# Patient Record
Sex: Male | Born: 1955
Health system: Southern US, Community
[De-identification: ages and names within clinical notes are randomized; demographics above are authoritative.]

## PROBLEM LIST (undated history)

## (undated) DIAGNOSIS — M549 Dorsalgia, unspecified: Secondary | ICD-10-CM

## (undated) DIAGNOSIS — G8929 Other chronic pain: Secondary | ICD-10-CM

## (undated) DIAGNOSIS — F1110 Opioid abuse, uncomplicated: Secondary | ICD-10-CM

## (undated) DIAGNOSIS — M1990 Unspecified osteoarthritis, unspecified site: Secondary | ICD-10-CM

## (undated) DIAGNOSIS — J189 Pneumonia, unspecified organism: Secondary | ICD-10-CM

## (undated) DIAGNOSIS — F329 Major depressive disorder, single episode, unspecified: Secondary | ICD-10-CM

## (undated) DIAGNOSIS — J449 Chronic obstructive pulmonary disease, unspecified: Secondary | ICD-10-CM

## (undated) DIAGNOSIS — E785 Hyperlipidemia, unspecified: Secondary | ICD-10-CM

## (undated) DIAGNOSIS — F419 Anxiety disorder, unspecified: Secondary | ICD-10-CM

## (undated) DIAGNOSIS — F32A Depression, unspecified: Secondary | ICD-10-CM

## (undated) DIAGNOSIS — M542 Cervicalgia: Secondary | ICD-10-CM

## (undated) DIAGNOSIS — I10 Essential (primary) hypertension: Secondary | ICD-10-CM

## (undated) HISTORY — PX: HERNIA REPAIR: SHX51

## (undated) HISTORY — DX: Hyperlipidemia, unspecified: E78.5

## (undated) HISTORY — DX: Unspecified osteoarthritis, unspecified site: M19.90

## (undated) HISTORY — PX: NECK SURGERY: SHX720

## (undated) HISTORY — PX: OTHER SURGICAL HISTORY: SHX169

## (undated) HISTORY — PX: CHOLECYSTECTOMY: SHX55

## (undated) HISTORY — PX: SPINAL FUSION: SHX223

## (undated) HISTORY — DX: Pneumonia, unspecified organism: J18.9

---

## 2004-05-15 ENCOUNTER — Ambulatory Visit: Payer: Self-pay | Admitting: Physician Assistant

## 2004-06-12 ENCOUNTER — Ambulatory Visit: Payer: Self-pay | Admitting: Physician Assistant

## 2004-07-09 ENCOUNTER — Ambulatory Visit: Payer: Self-pay | Admitting: Physician Assistant

## 2004-11-19 ENCOUNTER — Ambulatory Visit: Payer: Self-pay | Admitting: Physician Assistant

## 2006-03-11 ENCOUNTER — Ambulatory Visit: Payer: Self-pay | Admitting: Pain Medicine

## 2006-03-24 ENCOUNTER — Ambulatory Visit: Payer: Self-pay | Admitting: Pain Medicine

## 2006-03-31 ENCOUNTER — Ambulatory Visit: Payer: Self-pay | Admitting: Pain Medicine

## 2006-04-07 ENCOUNTER — Ambulatory Visit: Payer: Self-pay | Admitting: Pain Medicine

## 2006-04-21 ENCOUNTER — Ambulatory Visit: Payer: Self-pay | Admitting: Pain Medicine

## 2006-05-19 ENCOUNTER — Ambulatory Visit: Payer: Self-pay | Admitting: Physician Assistant

## 2006-06-22 ENCOUNTER — Ambulatory Visit: Payer: Self-pay | Admitting: Physician Assistant

## 2006-06-29 ENCOUNTER — Ambulatory Visit: Payer: Self-pay | Admitting: Family Medicine

## 2006-07-13 ENCOUNTER — Ambulatory Visit: Payer: Self-pay | Admitting: Physician Assistant

## 2006-08-19 ENCOUNTER — Ambulatory Visit: Payer: Self-pay | Admitting: Physician Assistant

## 2006-09-23 ENCOUNTER — Ambulatory Visit: Payer: Self-pay | Admitting: Physician Assistant

## 2007-01-01 ENCOUNTER — Ambulatory Visit: Payer: Self-pay | Admitting: Physician Assistant

## 2007-01-15 ENCOUNTER — Ambulatory Visit: Payer: Self-pay | Admitting: Physician Assistant

## 2007-03-01 ENCOUNTER — Ambulatory Visit: Payer: Self-pay | Admitting: Physician Assistant

## 2007-04-26 ENCOUNTER — Emergency Department: Payer: Self-pay | Admitting: Emergency Medicine

## 2007-07-02 ENCOUNTER — Other Ambulatory Visit: Payer: Self-pay

## 2007-07-03 ENCOUNTER — Inpatient Hospital Stay: Payer: Self-pay | Admitting: General Surgery

## 2007-07-06 ENCOUNTER — Other Ambulatory Visit: Payer: Self-pay

## 2009-06-18 ENCOUNTER — Emergency Department: Payer: Self-pay | Admitting: Emergency Medicine

## 2009-06-21 ENCOUNTER — Emergency Department: Payer: Self-pay | Admitting: Emergency Medicine

## 2009-06-25 ENCOUNTER — Emergency Department: Payer: Self-pay | Admitting: Emergency Medicine

## 2009-07-02 ENCOUNTER — Emergency Department: Payer: Self-pay | Admitting: Emergency Medicine

## 2009-07-16 ENCOUNTER — Emergency Department: Payer: Self-pay | Admitting: Emergency Medicine

## 2010-10-08 DIAGNOSIS — J449 Chronic obstructive pulmonary disease, unspecified: Secondary | ICD-10-CM | POA: Insufficient documentation

## 2011-01-09 DIAGNOSIS — F17200 Nicotine dependence, unspecified, uncomplicated: Secondary | ICD-10-CM | POA: Insufficient documentation

## 2011-03-25 DIAGNOSIS — K219 Gastro-esophageal reflux disease without esophagitis: Secondary | ICD-10-CM | POA: Insufficient documentation

## 2011-05-14 DIAGNOSIS — G894 Chronic pain syndrome: Secondary | ICD-10-CM | POA: Insufficient documentation

## 2011-05-14 DIAGNOSIS — M4802 Spinal stenosis, cervical region: Secondary | ICD-10-CM | POA: Insufficient documentation

## 2011-05-14 DIAGNOSIS — K429 Umbilical hernia without obstruction or gangrene: Secondary | ICD-10-CM | POA: Insufficient documentation

## 2011-08-06 DIAGNOSIS — F329 Major depressive disorder, single episode, unspecified: Secondary | ICD-10-CM | POA: Insufficient documentation

## 2011-08-06 DIAGNOSIS — G4700 Insomnia, unspecified: Secondary | ICD-10-CM | POA: Insufficient documentation

## 2011-08-11 ENCOUNTER — Emergency Department: Payer: Self-pay | Admitting: Emergency Medicine

## 2011-08-11 LAB — TROPONIN I: Troponin-I: <0.02 ng/mL

## 2011-08-11 LAB — COMPREHENSIVE METABOLIC PANEL
Albumin: 3.6 g/dL (ref 3.4–5.0)
Alkaline Phosphatase: 62 U/L (ref 50–136)
BUN: 10 mg/dL (ref 7–18)
Bilirubin,Total: 0.5 mg/dL (ref 0.2–1.0)
Chloride: 104 mmol/L (ref 98–107)
Creatinine: 0.86 mg/dL (ref 0.60–1.30)
EGFR (African American): >60
SGOT(AST): 25 U/L (ref 15–37)
SGPT (ALT): 28 U/L
Sodium: 139 mmol/L (ref 136–145)
Total Protein: 6.9 g/dL (ref 6.4–8.2)

## 2011-08-11 LAB — URINALYSIS, COMPLETE
Bacteria: NONE SEEN
Bilirubin,UR: NEGATIVE
Blood: NEGATIVE
Ketone: NEGATIVE
Ph: 6 (ref 4.5–8.0)
Specific Gravity: 1.004 (ref 1.003–1.030)
Squamous Epithelial: NONE SEEN

## 2011-08-11 LAB — CBC
MCH: 31.2 pg (ref 26.0–34.0)
MCV: 95 fL (ref 80–100)
Platelet: 309 10*3/uL (ref 150–440)
RBC: 5.17 10*6/uL (ref 4.40–5.90)
RDW: 16.7 % — ABNORMAL HIGH (ref 11.5–14.5)
WBC: 15.1 10*3/uL — ABNORMAL HIGH (ref 3.8–10.6)

## 2011-08-11 LAB — DRUG SCREEN, URINE
Benzodiazepine, Ur Scrn: NEGATIVE (ref ?–200)
Cannabinoid 50 Ng, Ur ~~LOC~~: NEGATIVE (ref ?–50)
Cocaine Metabolite,Ur ~~LOC~~: NEGATIVE (ref ?–300)
MDMA (Ecstasy)Ur Screen: NEGATIVE (ref ?–500)
Methadone, Ur Screen: NEGATIVE (ref ?–300)
Opiate, Ur Screen: NEGATIVE (ref ?–300)
Phencyclidine (PCP) Ur S: NEGATIVE (ref ?–25)
Tricyclic, Ur Screen: POSITIVE (ref ?–1000)

## 2011-08-11 LAB — ETHANOL: Ethanol: <3 mg/dL

## 2012-06-27 ENCOUNTER — Emergency Department: Payer: Self-pay | Admitting: Emergency Medicine

## 2012-06-27 LAB — URINALYSIS, COMPLETE
Bilirubin,UR: NEGATIVE
Blood: NEGATIVE
Leukocyte Esterase: NEGATIVE
Nitrite: NEGATIVE
Ph: 7 (ref 4.5–8.0)
Protein: NEGATIVE
Specific Gravity: 1.001 (ref 1.003–1.030)
Squamous Epithelial: NONE SEEN
WBC UR: NONE SEEN /HPF (ref 0–5)

## 2012-06-27 LAB — CBC
HGB: 14.3 g/dL (ref 13.0–18.0)
Platelet: 308 10*3/uL (ref 150–440)
RBC: 4.77 10*6/uL (ref 4.40–5.90)
RDW: 16.9 % — ABNORMAL HIGH (ref 11.5–14.5)
WBC: 8.4 10*3/uL (ref 3.8–10.6)

## 2012-06-27 LAB — COMPREHENSIVE METABOLIC PANEL
Alkaline Phosphatase: 89 U/L (ref 50–136)
Anion Gap: 7 (ref 7–16)
BUN: 6 mg/dL — ABNORMAL LOW (ref 7–18)
Calcium, Total: 9 mg/dL (ref 8.5–10.1)
Chloride: 105 mmol/L (ref 98–107)
Co2: 28 mmol/L (ref 21–32)
Creatinine: 0.92 mg/dL (ref 0.60–1.30)
EGFR (African American): >60
EGFR (Non-African Amer.): >60
Glucose: 120 mg/dL — ABNORMAL HIGH (ref 65–99)
Osmolality: 278 (ref 275–301)
Potassium: 4.1 mmol/L (ref 3.5–5.1)
SGOT(AST): 18 U/L (ref 15–37)
SGPT (ALT): 14 U/L (ref 12–78)
Total Protein: 7.1 g/dL (ref 6.4–8.2)

## 2012-08-14 ENCOUNTER — Emergency Department: Payer: Self-pay | Admitting: Emergency Medicine

## 2012-08-14 LAB — ACETAMINOPHEN LEVEL: Acetaminophen: <2 ug/mL

## 2012-08-14 LAB — URINALYSIS, COMPLETE
Bacteria: NONE SEEN
Bilirubin,UR: NEGATIVE
Glucose,UR: NEGATIVE mg/dL (ref 0–75)
Ketone: NEGATIVE
Leukocyte Esterase: NEGATIVE
Nitrite: NEGATIVE
Ph: 5 (ref 4.5–8.0)
Protein: NEGATIVE
Specific Gravity: 1.003 (ref 1.003–1.030)
WBC UR: <1 /HPF (ref 0–5)

## 2012-08-14 LAB — COMPREHENSIVE METABOLIC PANEL
Albumin: 3.8 g/dL (ref 3.4–5.0)
Alkaline Phosphatase: 121 U/L (ref 50–136)
Anion Gap: 10 (ref 7–16)
Bilirubin,Total: 0.5 mg/dL (ref 0.2–1.0)
Calcium, Total: 9.4 mg/dL (ref 8.5–10.1)
Co2: 25 mmol/L (ref 21–32)
EGFR (Non-African Amer.): >60
Glucose: 115 mg/dL — ABNORMAL HIGH (ref 65–99)
Osmolality: 270 (ref 275–301)
SGOT(AST): 24 U/L (ref 15–37)
Sodium: 135 mmol/L — ABNORMAL LOW (ref 136–145)
Total Protein: 7.7 g/dL (ref 6.4–8.2)

## 2012-08-14 LAB — CBC
HGB: 15.9 g/dL (ref 13.0–18.0)
MCH: 28.9 pg (ref 26.0–34.0)
Platelet: 394 10*3/uL (ref 150–440)
RBC: 5.48 10*6/uL (ref 4.40–5.90)
RDW: 17.3 % — ABNORMAL HIGH (ref 11.5–14.5)
WBC: 14.4 10*3/uL — ABNORMAL HIGH (ref 3.8–10.6)

## 2012-08-14 LAB — DRUG SCREEN, URINE
Amphetamines, Ur Screen: NEGATIVE (ref ?–1000)
Cannabinoid 50 Ng, Ur ~~LOC~~: NEGATIVE (ref ?–50)
Cocaine Metabolite,Ur ~~LOC~~: NEGATIVE (ref ?–300)
MDMA (Ecstasy)Ur Screen: NEGATIVE (ref ?–500)
Opiate, Ur Screen: NEGATIVE (ref ?–300)
Phencyclidine (PCP) Ur S: NEGATIVE (ref ?–25)
Tricyclic, Ur Screen: NEGATIVE (ref ?–1000)

## 2012-08-14 LAB — SALICYLATE LEVEL: Salicylates, Serum: 4.3 mg/dL — ABNORMAL HIGH

## 2013-02-01 DIAGNOSIS — M545 Low back pain, unspecified: Secondary | ICD-10-CM | POA: Insufficient documentation

## 2013-02-01 DIAGNOSIS — M961 Postlaminectomy syndrome, not elsewhere classified: Secondary | ICD-10-CM | POA: Insufficient documentation

## 2013-02-01 DIAGNOSIS — G8929 Other chronic pain: Secondary | ICD-10-CM | POA: Insufficient documentation

## 2013-05-26 LAB — COMPREHENSIVE METABOLIC PANEL
Albumin: 3.4 g/dL (ref 3.4–5.0)
Anion Gap: 6 — ABNORMAL LOW (ref 7–16)
BUN: 10 mg/dL (ref 7–18)
Bilirubin,Total: 0.4 mg/dL (ref 0.2–1.0)
Calcium, Total: 9 mg/dL (ref 8.5–10.1)
Co2: 24 mmol/L (ref 21–32)
Creatinine: 1.09 mg/dL (ref 0.60–1.30)
EGFR (Non-African Amer.): >60
Glucose: 130 mg/dL — ABNORMAL HIGH (ref 65–99)
SGPT (ALT): 14 U/L (ref 12–78)
Total Protein: 6.3 g/dL — ABNORMAL LOW (ref 6.4–8.2)

## 2013-05-26 LAB — ETHANOL: Ethanol: <3 mg/dL

## 2013-05-26 LAB — URINALYSIS, COMPLETE
Bacteria: NONE SEEN
Blood: NEGATIVE
Glucose,UR: NEGATIVE mg/dL (ref 0–75)
Ph: 7 (ref 4.5–8.0)
Protein: NEGATIVE
Specific Gravity: 1.01 (ref 1.003–1.030)
WBC UR: <1 /HPF (ref 0–5)

## 2013-05-26 LAB — DRUG SCREEN, URINE
Amphetamines, Ur Screen: NEGATIVE (ref ?–1000)
Benzodiazepine, Ur Scrn: NEGATIVE (ref ?–200)
Cannabinoid 50 Ng, Ur ~~LOC~~: NEGATIVE (ref ?–50)
Cocaine Metabolite,Ur ~~LOC~~: NEGATIVE (ref ?–300)
Methadone, Ur Screen: NEGATIVE (ref ?–300)
Opiate, Ur Screen: NEGATIVE (ref ?–300)
Phencyclidine (PCP) Ur S: NEGATIVE (ref ?–25)
Tricyclic, Ur Screen: NEGATIVE (ref ?–1000)

## 2013-05-26 LAB — CBC
HCT: 45.1 % (ref 40.0–52.0)
HGB: 15.5 g/dL (ref 13.0–18.0)
MCH: 32.6 pg (ref 26.0–34.0)
MCV: 95 fL (ref 80–100)
RDW: 15.7 % — ABNORMAL HIGH (ref 11.5–14.5)

## 2013-05-26 LAB — SALICYLATE LEVEL: Salicylates, Serum: 5.3 mg/dL — ABNORMAL HIGH

## 2013-05-26 LAB — ACETAMINOPHEN LEVEL: Acetaminophen: <2 ug/mL

## 2013-05-26 LAB — TSH: Thyroid Stimulating Horm: 0.356 u[IU]/mL — ABNORMAL LOW

## 2013-05-27 ENCOUNTER — Inpatient Hospital Stay: Payer: Self-pay | Admitting: Psychiatry

## 2014-02-28 DIAGNOSIS — I10 Essential (primary) hypertension: Secondary | ICD-10-CM | POA: Insufficient documentation

## 2014-05-03 DIAGNOSIS — K589 Irritable bowel syndrome without diarrhea: Secondary | ICD-10-CM | POA: Insufficient documentation

## 2014-06-14 DIAGNOSIS — H43821 Vitreomacular adhesion, right eye: Secondary | ICD-10-CM | POA: Insufficient documentation

## 2014-06-14 DIAGNOSIS — IMO0002 Reserved for concepts with insufficient information to code with codable children: Secondary | ICD-10-CM | POA: Insufficient documentation

## 2014-06-14 DIAGNOSIS — H04129 Dry eye syndrome of unspecified lacrimal gland: Secondary | ICD-10-CM | POA: Insufficient documentation

## 2014-07-11 ENCOUNTER — Ambulatory Visit: Payer: Self-pay | Admitting: Physician Assistant

## 2014-07-11 LAB — RAPID STREP-A WITH REFLX: Micro Text Report: NEGATIVE

## 2014-07-14 LAB — BETA STREP CULTURE(ARMC)

## 2014-11-17 NOTE — Consult Note (Signed)
Brief Consult Note: Diagnosis: Major depressive disorder.   Patient was seen by consultant.   Recommend further assessment or treatment.   Orders entered.   Comments: Will admitt to psychiatry.  Electronic Signatures: Orson Slick (MD)  (Signed 31-Oct-14 15:04)  Authored: Brief Consult Note   Last Updated: 31-Oct-14 15:04 by Orson Slick (MD)

## 2014-11-17 NOTE — H&P (Signed)
PATIENT NAME:  Stanley Rios, SANKER MR#:  [id number] DATE OF BIRTH:  Sep 05, 1955  DATE OF ADMISSION:  05/26/2013  REFERRING PHYSICIAN: Emergency Room M.D.   ATTENDING PHYSICIAN: Dashayla Theissen B. Bary Leriche, M.D.   IDENTIFYING DATA: Stanley Rios is a 59 year old male with history of depression and chronic pain.   CHIEF COMPLAINT: "It is my wife."   HISTORY OF PRESENT ILLNESS:  Stanley Rios was brought to the Emergency Room by EMS after he attempted to kill himself by Klonopin overdose and slashing his throat. Stitches were put in place and psychiatry was called for a consultation. The patient reports severe depression. He feels mistreated by his wife, who yells at him all the time. Apparently, they are in financial trouble that possibly was caused by his wife and reportedly there is a thousand dollar debt with the bank the patient is unable to pay. He feels that his family abandoned him and nobody cares, and the wife is really mean and nasty. He has been thinking about moving away, maybe to a nursing home, but has not been able to make any steps. In addition, the patient has chronic pain in his neck. It is very unclear as he is a poor historian, cries a lot and is unreliable. Apparently, the patient used to be a patient at the pain clinic. He was prescribed fentanyl patch and oxycodone, and it appears that he had been misusing his prescriptions. The family he did not like  when he was oversedated from pain medication, and they reported him to his pain doctor. It is quite possible that he was discharged from pain clinic. I did not have the opportunity to call the pain clinic or his pharmacy. The list of his medications in the Emergency Room seems to be incomplete. The patient reports interrupted sleep, decreased appetite, anhedonia, feeling of guilt, hopelessness, worthlessness, crying spells, heightened anxiety, social isolation, poor memory and concentration that culminated in a suicide attempt. He denies psychotic  symptoms. Denies symptoms suggestive of bipolar mania. He adamantly denies any prescription pill misuse. He denies alcohol or illicit substance use.   PAST PSYCHIATRIC HISTORY: He reports that he was hospitalized once here, but I see no proof of it, and after a suicide attempt by overdose. He believes that, again, he overdosed because the wife treated him poorly.   FAMILY PSYCHIATRIC HISTORY: None reported.   PAST MEDICAL HISTORY: Chronic pain, benign prostate hypertrophy, dyslipidemia, GERD, hypertension.   ALLERGIES: No known drug allergies.   MEDICATIONS ON ADMISSION: Amoxicillin 875 mg twice daily, lidocaine transdermal daily, Flomax 0.4 mg daily, oxycodone 10 mg every 6 hours as needed for pain, finasteride 1 mg daily, Zostavax subcutaneous injection, Proventil as needed, prednisone 10 mg daily, simvastatin 20 mg daily, omeprazole 40 mg daily, Neurontin 600 mg 4 times daily, Flonase 50 mg daily, furosemide 20 mg daily, enalapril 5 mg daily, Flexeril 5 mg 3 times daily, Cymbalta 30 mg twice daily, Advair Diskus twice daily, clonidine 0.1 mg twice daily.   SOCIAL HISTORY: As above, lives with wife. There is financial trouble. Feels unsupported. He is on disability, has Medicaid.     REVIEW OF SYSTEMS:   CONSTITUTIONAL: No fevers or chills. No weight changes.  EYES: No double or blurred vision.  ENT: No hearing loss.  RESPIRATORY: No shortness of breath or cough.  CARDIOVASCULAR: No chest pain or orthopnea.  GASTROINTESTINAL: No abdominal pain, nausea, vomiting or diarrhea.  ENDOCRINE: No heat or cold intolerance.  GENITOURINARY:  No incontinence or frequency.  LYMPHATIC:  No anemia or easy bruising.  INTEGUMENTARY: No acne or rash.  MUSCULOSKELETAL: No muscle or joint pain.  NEUROLOGIC: No tingling or weakness.  PSYCHIATRIC: See history of present illness for details.   PHYSICAL EXAMINATION: VITAL SIGNS: Blood pressure 110/56, pulse 71, respirations 18, temperature 98.4.  GENERAL:  This is a unkept elderly gentleman in no acute distress.  HEENT: The pupils equal, round and reactive to light. Sclerae anicteric.  NECK: Supple. No thyromegaly.  LUNGS: Clear to auscultation. No dullness to percussion.  HEART: Regular rhythm and rate. No murmurs, rubs or gallops.  ABDOMEN: Soft, nontender, nondistended. Positive bowel sounds.  MUSCULOSKELETAL: Normal muscle strength in all extremities.  SKIN: No rashes or bruises.  LYMPHATIC: No cervical adenopathy.  NEUROLOGIC: Cranial nerves II through XII are intact.   LABORATORY DATA: Chemistries within normal limits except for blood glucose of 130. Blood alcohol level 0. LFTs within normal limits. TSH 0.356. Urine tox screen negative for substances. CBC within normal limits. Urinalysis is not suggestive of urinary tract infection. Serum acetaminophen less than 2, serum salicylates 5.3.   MENTAL STATUS EXAMINATION ON ADMISSION: The patient is alert and oriented to person, place, time and situation. He is pleasant, polite and cooperative. He cries bitterly, complaining of his wife. He maintains no eye contact. He is very poorly groomed with dirty fingernails, extremely strong body odor. His speech is mumbled, difficult to understand. His mood is depressed with tearful affect. Thought process is logical. Thought content: He is suicidal, and he came to the hospital after a suicide attempt. He is able to contract for safety  here. There are no thoughts of hurting others. There are no delusions or paranoia. There are no auditory or visual hallucinations. His cognition is grossly intact. His insight and judgment are poor.   SUICIDE RISK ASSESSMENT ON ADMISSION: This is a patient with history of depression, chronic pain who attempted suicide in the context of marital discord and financial trouble. He is at increased risk of suicide.   DIAGNOSES: AXIS I: Major depressive disorder, recurrent, severe, without psychotic features.  AXIS II: Deferred.   AXIS III: Chronic pain, hypertension, chronic obstructive pulmonary disease, gastroesophageal reflux disease, dyslipidemia.  AXIS IV: Mental and physical illness. Marital conflict. Financial. Primary support.  AXIS V: Global Assessment of Functioning 25.   PLAN: The patient was [facility] for safety, stabilization and medication management. He was initially placed on suicide precautions and was closely monitored for any unsafe behaviors. He underwent full psychiatric and risk assessment. He received pharmacotherapy, individual and group psychotherapy, substance abuse counseling and support from therapeutic milieu.  1.  Suicidal ideation: The patient is able to contract for safety.  2.  Mood:  We will increase Cymbalta to 60 mg twice daily.  3.  Medical: We will continue all medications as in the community.  4.  Pain:  It is very unclear whether the patient requires any pain medication. He certainly did not complain of pain in the Emergency Room. We will obtain collateral data.   DISPOSITION: To be established. The patient would not mind to be placed, but I fear that his financial situation would not allow it; a Education officer, museum to investigate.     ____________________________ Wardell Honour. Bary Leriche, MD jbp:dmm D: 05/27/2013 19:20:00 ET T: 05/27/2013 20:13:40 ET JOB#: [id number]  cc: Hudson Majkowski B. Bary Leriche, MD, <Dictator> Clovis Fredrickson MD ELECTRONICALLY SIGNED 05/29/2013 22:10

## 2015-05-02 DIAGNOSIS — J439 Emphysema, unspecified: Secondary | ICD-10-CM | POA: Insufficient documentation

## 2015-05-16 DIAGNOSIS — L821 Other seborrheic keratosis: Secondary | ICD-10-CM | POA: Insufficient documentation

## 2015-08-16 DIAGNOSIS — M4802 Spinal stenosis, cervical region: Secondary | ICD-10-CM | POA: Diagnosis not present

## 2015-08-16 DIAGNOSIS — J431 Panlobular emphysema: Secondary | ICD-10-CM | POA: Diagnosis not present

## 2015-08-16 DIAGNOSIS — E559 Vitamin D deficiency, unspecified: Secondary | ICD-10-CM | POA: Insufficient documentation

## 2015-08-16 DIAGNOSIS — G894 Chronic pain syndrome: Secondary | ICD-10-CM | POA: Diagnosis not present

## 2015-08-16 DIAGNOSIS — E291 Testicular hypofunction: Secondary | ICD-10-CM | POA: Diagnosis not present

## 2015-08-16 DIAGNOSIS — I10 Essential (primary) hypertension: Secondary | ICD-10-CM | POA: Diagnosis not present

## 2015-08-16 DIAGNOSIS — E349 Endocrine disorder, unspecified: Secondary | ICD-10-CM | POA: Insufficient documentation

## 2015-08-16 DIAGNOSIS — F329 Major depressive disorder, single episode, unspecified: Secondary | ICD-10-CM | POA: Diagnosis not present

## 2015-10-17 DIAGNOSIS — J439 Emphysema, unspecified: Secondary | ICD-10-CM | POA: Diagnosis not present

## 2015-10-17 DIAGNOSIS — J449 Chronic obstructive pulmonary disease, unspecified: Secondary | ICD-10-CM | POA: Diagnosis not present

## 2015-10-17 DIAGNOSIS — F17200 Nicotine dependence, unspecified, uncomplicated: Secondary | ICD-10-CM | POA: Diagnosis not present

## 2015-10-17 DIAGNOSIS — J431 Panlobular emphysema: Secondary | ICD-10-CM | POA: Diagnosis not present

## 2015-10-25 ENCOUNTER — Encounter: Payer: Self-pay | Admitting: Urgent Care

## 2015-10-25 DIAGNOSIS — G8929 Other chronic pain: Secondary | ICD-10-CM | POA: Diagnosis present

## 2015-10-25 DIAGNOSIS — N5089 Other specified disorders of the male genital organs: Secondary | ICD-10-CM | POA: Diagnosis not present

## 2015-10-25 DIAGNOSIS — K219 Gastro-esophageal reflux disease without esophagitis: Secondary | ICD-10-CM | POA: Diagnosis present

## 2015-10-25 DIAGNOSIS — I10 Essential (primary) hypertension: Secondary | ICD-10-CM | POA: Diagnosis not present

## 2015-10-25 DIAGNOSIS — J449 Chronic obstructive pulmonary disease, unspecified: Secondary | ICD-10-CM | POA: Diagnosis present

## 2015-10-25 DIAGNOSIS — A419 Sepsis, unspecified organism: Principal | ICD-10-CM | POA: Diagnosis present

## 2015-10-25 DIAGNOSIS — N400 Enlarged prostate without lower urinary tract symptoms: Secondary | ICD-10-CM | POA: Diagnosis present

## 2015-10-25 DIAGNOSIS — N492 Inflammatory disorders of scrotum: Secondary | ICD-10-CM | POA: Diagnosis present

## 2015-10-25 DIAGNOSIS — M549 Dorsalgia, unspecified: Secondary | ICD-10-CM | POA: Diagnosis not present

## 2015-10-25 DIAGNOSIS — J438 Other emphysema: Secondary | ICD-10-CM | POA: Diagnosis not present

## 2015-10-25 DIAGNOSIS — Z8052 Family history of malignant neoplasm of bladder: Secondary | ICD-10-CM | POA: Diagnosis not present

## 2015-10-25 DIAGNOSIS — Z79899 Other long term (current) drug therapy: Secondary | ICD-10-CM | POA: Diagnosis not present

## 2015-10-25 DIAGNOSIS — F17200 Nicotine dependence, unspecified, uncomplicated: Secondary | ICD-10-CM | POA: Diagnosis not present

## 2015-10-25 DIAGNOSIS — E876 Hypokalemia: Secondary | ICD-10-CM | POA: Diagnosis not present

## 2015-10-25 DIAGNOSIS — Z7952 Long term (current) use of systemic steroids: Secondary | ICD-10-CM

## 2015-10-25 DIAGNOSIS — D72829 Elevated white blood cell count, unspecified: Secondary | ICD-10-CM | POA: Diagnosis not present

## 2015-10-25 NOTE — ED Notes (Addendum)
Patient presents stating, "I have an infection in my groin". Patient advising that he "messed around with it', which caused "something inside to bust". Patient reporting (+) swelling to his suprapubic area, penis, and scrotum - states, "It is all over down there". Denies urinary symptoms; no penile discharge or bleeding. (+) fever; tmax unknown. Patient has "4 doses" of Amoxicillin left over at home - has been taking those in attempts to self treat. Patient reports insomnia x 3 nights. Also wanting to get his lip checked out - reports that it has been swelling as well. No appreciable swelling noted on exam in triage.

## 2015-10-26 ENCOUNTER — Encounter: Payer: Self-pay | Admitting: Internal Medicine

## 2015-10-26 ENCOUNTER — Inpatient Hospital Stay
Admission: EM | Admit: 2015-10-26 | Discharge: 2015-10-27 | DRG: 872 | Disposition: A | Discharge status: Home / Self Care | Payer: PPO | Attending: Internal Medicine | Admitting: Internal Medicine

## 2015-10-26 ENCOUNTER — Emergency Department: Payer: PPO

## 2015-10-26 DIAGNOSIS — L0291 Cutaneous abscess, unspecified: Secondary | ICD-10-CM

## 2015-10-26 DIAGNOSIS — Z7952 Long term (current) use of systemic steroids: Secondary | ICD-10-CM | POA: Diagnosis not present

## 2015-10-26 DIAGNOSIS — I10 Essential (primary) hypertension: Secondary | ICD-10-CM | POA: Diagnosis not present

## 2015-10-26 DIAGNOSIS — E876 Hypokalemia: Secondary | ICD-10-CM | POA: Diagnosis present

## 2015-10-26 DIAGNOSIS — N5089 Other specified disorders of the male genital organs: Secondary | ICD-10-CM | POA: Diagnosis not present

## 2015-10-26 DIAGNOSIS — L0390 Cellulitis, unspecified: Secondary | ICD-10-CM | POA: Diagnosis present

## 2015-10-26 DIAGNOSIS — N492 Inflammatory disorders of scrotum: Secondary | ICD-10-CM | POA: Diagnosis not present

## 2015-10-26 DIAGNOSIS — F17200 Nicotine dependence, unspecified, uncomplicated: Secondary | ICD-10-CM | POA: Diagnosis present

## 2015-10-26 DIAGNOSIS — K219 Gastro-esophageal reflux disease without esophagitis: Secondary | ICD-10-CM | POA: Diagnosis present

## 2015-10-26 DIAGNOSIS — J449 Chronic obstructive pulmonary disease, unspecified: Secondary | ICD-10-CM | POA: Diagnosis not present

## 2015-10-26 DIAGNOSIS — M549 Dorsalgia, unspecified: Secondary | ICD-10-CM | POA: Diagnosis present

## 2015-10-26 DIAGNOSIS — Z79899 Other long term (current) drug therapy: Secondary | ICD-10-CM | POA: Diagnosis not present

## 2015-10-26 DIAGNOSIS — G8929 Other chronic pain: Secondary | ICD-10-CM | POA: Diagnosis present

## 2015-10-26 DIAGNOSIS — A419 Sepsis, unspecified organism: Secondary | ICD-10-CM | POA: Diagnosis present

## 2015-10-26 DIAGNOSIS — Z8052 Family history of malignant neoplasm of bladder: Secondary | ICD-10-CM | POA: Diagnosis not present

## 2015-10-26 DIAGNOSIS — N400 Enlarged prostate without lower urinary tract symptoms: Secondary | ICD-10-CM | POA: Diagnosis present

## 2015-10-26 HISTORY — DX: Chronic obstructive pulmonary disease, unspecified: J44.9

## 2015-10-26 HISTORY — DX: Other chronic pain: G89.29

## 2015-10-26 HISTORY — DX: Dorsalgia, unspecified: M54.9

## 2015-10-26 HISTORY — DX: Cervicalgia: M54.2

## 2015-10-26 LAB — URINALYSIS COMPLETE WITH MICROSCOPIC (ARMC ONLY)
BACTERIA UA: NONE SEEN
BILIRUBIN URINE: NEGATIVE
Glucose, UA: NEGATIVE mg/dL
HGB URINE DIPSTICK: NEGATIVE
Ketones, ur: NEGATIVE mg/dL
LEUKOCYTES UA: NEGATIVE
NITRITE: NEGATIVE
PH: 6 (ref 5.0–8.0)
Protein, ur: NEGATIVE mg/dL
RBC / HPF: NONE SEEN RBC/hpf (ref 0–5)
SPECIFIC GRAVITY, URINE: 1.005 (ref 1.005–1.030)
Squamous Epithelial / LPF: NONE SEEN
WBC UA: NONE SEEN WBC/hpf (ref 0–5)

## 2015-10-26 LAB — CBC
HEMATOCRIT: 41 % (ref 40.0–52.0)
HEMOGLOBIN: 13.9 g/dL (ref 13.0–18.0)
MCH: 33 pg (ref 26.0–34.0)
MCHC: 34 g/dL (ref 32.0–36.0)
MCV: 97.1 fL (ref 80.0–100.0)
Platelets: 257 10*3/uL (ref 150–440)
RBC: 4.23 MIL/uL — AB (ref 4.40–5.90)
RDW: 16.5 % — AB (ref 11.5–14.5)
WBC: 14.3 10*3/uL — AB (ref 3.8–10.6)

## 2015-10-26 LAB — BASIC METABOLIC PANEL
ANION GAP: 8 (ref 5–15)
BUN: 16 mg/dL (ref 6–20)
CHLORIDE: 102 mmol/L (ref 101–111)
CO2: 25 mmol/L (ref 22–32)
Calcium: 9 mg/dL (ref 8.9–10.3)
Creatinine, Ser: 1.21 mg/dL (ref 0.61–1.24)
GFR calc non Af Amer: >60 mL/min (ref >60)
Glucose, Bld: 145 mg/dL — ABNORMAL HIGH (ref 65–99)
POTASSIUM: 3.4 mmol/L — AB (ref 3.5–5.1)
Sodium: 135 mmol/L (ref 135–145)

## 2015-10-26 MED ORDER — MORPHINE SULFATE ER 15 MG PO TBCR: 15.0000 mg | EXTENDED_RELEASE_TABLET | Freq: Two times a day (BID) | ORAL | Status: DC

## 2015-10-26 MED ORDER — RANITIDINE HCL 150 MG/10ML PO SYRP
150.0000 mg | ORAL_SOLUTION | Freq: Two times a day (BID) | ORAL | Status: DC
  Administered 2015-10-26 – 2015-10-27 (×2): 150 mg via ORAL
  Filled 2015-10-26 (×4): qty 10

## 2015-10-26 MED ORDER — GABAPENTIN 400 MG PO CAPS
400.0000 mg | ORAL_CAPSULE | Freq: Three times a day (TID) | ORAL | Status: DC
Start: 2015-10-26 — End: 2015-10-26
  Administered 2015-10-26: 400 mg via ORAL
  Filled 2015-10-26: qty 1

## 2015-10-26 MED ORDER — OMEGA-3-ACID ETHYL ESTERS 1 G PO CAPS
1.0000 g | ORAL_CAPSULE | Freq: Every day | ORAL | Status: DC
  Administered 2015-10-26 – 2015-10-27 (×2): 1 g via ORAL
  Filled 2015-10-26 (×2): qty 1

## 2015-10-26 MED ORDER — BACLOFEN 10 MG PO TABS
10.0000 mg | ORAL_TABLET | Freq: Two times a day (BID) | ORAL | Status: DC
  Administered 2015-10-26 – 2015-10-27 (×3): 10 mg via ORAL
  Filled 2015-10-26 (×3): qty 1

## 2015-10-26 MED ORDER — SODIUM CHLORIDE 0.9 % IV BOLUS (SEPSIS)
1000.0000 mL | Freq: Once | INTRAVENOUS | Status: AC
  Administered 2015-10-26: 1000 mL via INTRAVENOUS

## 2015-10-26 MED ORDER — TIOTROPIUM BROMIDE MONOHYDRATE 18 MCG IN CAPS
18.0000 ug | ORAL_CAPSULE | Freq: Every day | RESPIRATORY_TRACT | Status: DC
  Administered 2015-10-26 – 2015-10-27 (×2): 18 ug via RESPIRATORY_TRACT
  Filled 2015-10-26: qty 5

## 2015-10-26 MED ORDER — RANITIDINE HCL 15 MG/ML PO SYRP
4.0000 mg/kg/d | ORAL_SOLUTION | Freq: Two times a day (BID) | ORAL | Status: DC
  Filled 2015-10-26 (×2): qty 10.7

## 2015-10-26 MED ORDER — CELECOXIB 200 MG PO CAPS: 200.0000 mg | ORAL_CAPSULE | Freq: Every day | ORAL | Status: DC

## 2015-10-26 MED ORDER — SODIUM CHLORIDE 0.9% FLUSH: 3.0000 mL | INTRAVENOUS | Status: DC | PRN

## 2015-10-26 MED ORDER — POTASSIUM CHLORIDE CRYS ER 20 MEQ PO TBCR: 20.0000 meq | EXTENDED_RELEASE_TABLET | Freq: Once | ORAL | Status: DC

## 2015-10-26 MED ORDER — CEFAZOLIN SODIUM-DEXTROSE 2-4 GM/100ML-% IV SOLN
2.0000 g | Freq: Three times a day (TID) | INTRAVENOUS | Status: DC
  Administered 2015-10-26 – 2015-10-27 (×4): 2 g via INTRAVENOUS
  Filled 2015-10-26 (×6): qty 100

## 2015-10-26 MED ORDER — VITAMIN D (ERGOCALCIFEROL) 1.25 MG (50000 UNIT) PO CAPS
50000.0000 [IU] | ORAL_CAPSULE | ORAL | Status: DC
  Administered 2015-10-26: 50000 [IU] via ORAL
  Filled 2015-10-26: qty 1

## 2015-10-26 MED ORDER — MORPHINE-NALTREXONE 20-0.8 MG PO CPCR: 1.0000 | ORAL_CAPSULE | Freq: Two times a day (BID) | ORAL | Status: DC

## 2015-10-26 MED ORDER — VANCOMYCIN HCL IN DEXTROSE 1-5 GM/200ML-% IV SOLN
1000.0000 mg | Freq: Once | INTRAVENOUS | Status: AC
  Administered 2015-10-26: 1000 mg via INTRAVENOUS
  Filled 2015-10-26: qty 200

## 2015-10-26 MED ORDER — FLUTICASONE PROPIONATE 50 MCG/ACT NA SUSP
2.0000 | Freq: Every day | NASAL | Status: DC
  Administered 2015-10-26 – 2015-10-27 (×2): 2 via NASAL
  Filled 2015-10-26: qty 16

## 2015-10-26 MED ORDER — TRAZODONE HCL 50 MG PO TABS
150.0000 mg | ORAL_TABLET | Freq: Every day | ORAL | Status: DC
  Administered 2015-10-26: 150 mg via ORAL
  Filled 2015-10-26: qty 3

## 2015-10-26 MED ORDER — SODIUM CHLORIDE 0.9 % IV SOLN: 250.0000 mL | INTRAVENOUS | Status: DC | PRN

## 2015-10-26 MED ORDER — OXYCODONE HCL 5 MG PO TABS
5.0000 mg | ORAL_TABLET | ORAL | Status: DC | PRN
Start: 2015-10-26 — End: 2015-10-27

## 2015-10-26 MED ORDER — ACETAMINOPHEN 325 MG PO TABS: 650.0000 mg | ORAL_TABLET | Freq: Four times a day (QID) | ORAL | Status: DC | PRN

## 2015-10-26 MED ORDER — ENALAPRIL MALEATE 2.5 MG PO TABS
5.0000 mg | ORAL_TABLET | Freq: Every day | ORAL | Status: DC
  Administered 2015-10-26 – 2015-10-27 (×2): 5 mg via ORAL
  Filled 2015-10-26 (×2): qty 2

## 2015-10-26 MED ORDER — ENOXAPARIN SODIUM 40 MG/0.4ML ~~LOC~~ SOLN
40.0000 mg | SUBCUTANEOUS | Status: DC
  Administered 2015-10-26 – 2015-10-27 (×2): 40 mg via SUBCUTANEOUS
  Filled 2015-10-26 (×2): qty 0.4

## 2015-10-26 MED ORDER — BUPROPION HCL 75 MG PO TABS
75.0000 mg | ORAL_TABLET | Freq: Two times a day (BID) | ORAL | Status: DC
  Administered 2015-10-26 – 2015-10-27 (×3): 75 mg via ORAL
  Filled 2015-10-26 (×4): qty 1

## 2015-10-26 MED ORDER — ONDANSETRON HCL 4 MG/2ML IJ SOLN
4.0000 mg | Freq: Four times a day (QID) | INTRAMUSCULAR | Status: DC | PRN
  Administered 2015-10-26: 4 mg via INTRAVENOUS
  Filled 2015-10-26: qty 2

## 2015-10-26 MED ORDER — LIDOCAINE 5 % EX OINT
1.0000 "application " | TOPICAL_OINTMENT | Freq: Every day | CUTANEOUS | Status: DC | PRN
  Filled 2015-10-26: qty 35.44

## 2015-10-26 MED ORDER — PANTOPRAZOLE SODIUM 40 MG PO TBEC
40.0000 mg | DELAYED_RELEASE_TABLET | Freq: Every day | ORAL | Status: DC
  Administered 2015-10-26 – 2015-10-27 (×2): 40 mg via ORAL
  Filled 2015-10-26 (×2): qty 1

## 2015-10-26 MED ORDER — ASPIRIN-ACETAMINOPHEN-CAFFEINE 250-250-65 MG PO TABS
1.0000 | ORAL_TABLET | Freq: Four times a day (QID) | ORAL | Status: DC | PRN
  Filled 2015-10-26: qty 1

## 2015-10-26 MED ORDER — SIMVASTATIN 20 MG PO TABS
20.0000 mg | ORAL_TABLET | Freq: Every day | ORAL | Status: DC
  Administered 2015-10-26: 20 mg via ORAL
  Filled 2015-10-26: qty 1

## 2015-10-26 MED ORDER — SENNOSIDES-DOCUSATE SODIUM 8.6-50 MG PO TABS: 1.0000 | ORAL_TABLET | Freq: Every evening | ORAL | Status: DC | PRN

## 2015-10-26 MED ORDER — ALBUTEROL SULFATE (2.5 MG/3ML) 0.083% IN NEBU: 2.5000 mg | INHALATION_SOLUTION | Freq: Four times a day (QID) | RESPIRATORY_TRACT | Status: DC | PRN

## 2015-10-26 MED ORDER — ALBUTEROL SULFATE HFA 108 (90 BASE) MCG/ACT IN AERS: 1.0000 | INHALATION_SPRAY | Freq: Four times a day (QID) | RESPIRATORY_TRACT | Status: DC | PRN

## 2015-10-26 MED ORDER — FINASTERIDE 5 MG PO TABS
5.0000 mg | ORAL_TABLET | Freq: Every day | ORAL | Status: DC
  Administered 2015-10-26 – 2015-10-27 (×2): 5 mg via ORAL
  Filled 2015-10-26 (×2): qty 1

## 2015-10-26 MED ORDER — METRONIDAZOLE IN NACL 5-0.79 MG/ML-% IV SOLN
500.0000 mg | Freq: Once | INTRAVENOUS | Status: AC
  Administered 2015-10-26: 500 mg via INTRAVENOUS
  Filled 2015-10-26: qty 100

## 2015-10-26 MED ORDER — AMLODIPINE BESYLATE 5 MG PO TABS: 5.0000 mg | ORAL_TABLET | Freq: Every day | ORAL | Status: DC

## 2015-10-26 MED ORDER — NYSTATIN 100000 UNIT/GM EX POWD
Freq: Once | CUTANEOUS | Status: AC
  Administered 2015-10-26: 12:00:00 via TOPICAL
  Filled 2015-10-26 (×2): qty 15

## 2015-10-26 MED ORDER — SODIUM CHLORIDE 0.9% FLUSH
3.0000 mL | Freq: Two times a day (BID) | INTRAVENOUS | Status: DC
  Administered 2015-10-26 – 2015-10-27 (×2): 3 mL via INTRAVENOUS

## 2015-10-26 MED ORDER — DULOXETINE HCL 60 MG PO CPEP
60.0000 mg | ORAL_CAPSULE | Freq: Every day | ORAL | Status: DC
  Administered 2015-10-26 – 2015-10-27 (×2): 60 mg via ORAL
  Filled 2015-10-26 (×2): qty 1

## 2015-10-26 MED ORDER — TAMSULOSIN HCL 0.4 MG PO CAPS
0.4000 mg | ORAL_CAPSULE | Freq: Every day | ORAL | Status: DC
  Administered 2015-10-26 – 2015-10-27 (×2): 0.4 mg via ORAL
  Filled 2015-10-26 (×2): qty 1

## 2015-10-26 MED ORDER — POLYETHYLENE GLYCOL 3350 17 G PO PACK
17.0000 g | PACK | Freq: Every day | ORAL | Status: DC
  Administered 2015-10-27: 17 g via ORAL
  Filled 2015-10-26 (×2): qty 1

## 2015-10-26 MED ORDER — PREDNISONE 5 MG PO TABS
10.0000 mg | ORAL_TABLET | Freq: Every day | ORAL | Status: DC
  Administered 2015-10-26 – 2015-10-27 (×2): 10 mg via ORAL
  Filled 2015-10-26 (×2): qty 2

## 2015-10-26 MED ORDER — PIPERACILLIN-TAZOBACTAM 3.375 G IVPB
3.3750 g | Freq: Once | INTRAVENOUS | Status: AC
Start: 2015-10-26 — End: 2015-10-26
  Administered 2015-10-26: 3.375 g via INTRAVENOUS
  Filled 2015-10-26: qty 50

## 2015-10-26 MED ORDER — MORPHINE SULFATE (PF) 2 MG/ML IV SOLN
2.0000 mg | INTRAVENOUS | Status: DC | PRN
  Administered 2015-10-26 – 2015-10-27 (×5): 2 mg via INTRAVENOUS
  Filled 2015-10-26 (×5): qty 1

## 2015-10-26 MED ORDER — MOMETASONE FURO-FORMOTEROL FUM 200-5 MCG/ACT IN AERO
2.0000 | INHALATION_SPRAY | Freq: Two times a day (BID) | RESPIRATORY_TRACT | Status: DC
  Administered 2015-10-26 – 2015-10-27 (×3): 2 via RESPIRATORY_TRACT
  Filled 2015-10-26: qty 8.8

## 2015-10-26 MED ORDER — OXYCODONE HCL 5 MG PO TABS
15.0000 mg | ORAL_TABLET | Freq: Two times a day (BID) | ORAL | Status: DC
  Administered 2015-10-26: 15 mg via ORAL
  Filled 2015-10-26: qty 3

## 2015-10-26 MED ORDER — ONDANSETRON HCL 4 MG PO TABS
4.0000 mg | ORAL_TABLET | Freq: Four times a day (QID) | ORAL | Status: DC | PRN
Start: 2015-10-26 — End: 2015-10-27

## 2015-10-26 MED ORDER — ACETAMINOPHEN 650 MG RE SUPP: 650.0000 mg | Freq: Four times a day (QID) | RECTAL | Status: DC | PRN

## 2015-10-26 NOTE — H&P (Signed)
[facility] [place] NAME: Stanley Rios    MR#:  [id number]  DATE OF BIRTH:  05-Dec-1955  DATE OF ADMISSION:  10/26/2015  PRIMARY CARE PHYSICIAN: No primary care provider on file.   REQUESTING/REFERRING PHYSICIAN:   CHIEF COMPLAINT:   Chief Complaint  Patient presents with  . Abscess    HISTORY OF PRESENT ILLNESS: Stanley Rios  is a 60 y.o. male with a known history of COPD, chronic back pain, neck pain presented to the emergency room with redness around the scrotum and in the inner side of the thighs since 3 days. Initially started as a small pimple or the scrotal area 3 days ago. Patient squeezed it and scratched it and it became red and swollen. Scrotal was gotten inflamed and thickened. Patient has some aching pain in the scrotal area which is 6 out of 10 scale of 1-10. Had fever on the first day when he presented. No history of any trauma to the inguinal area and groin area. Patient was evaluated with a scrotal ultrasound which showed scrotal wall thickening and cellulitis. Patient took some oral amoxicillin pills at home which were left out from a previous prescription. No complaints of chest pain and shortness of breath.  PAST MEDICAL HISTORY:   Past Medical History  Diagnosis Date  . COPD (chronic obstructive pulmonary disease) ([place])   . Chronic back pain   . Chronic neck pain     PAST SURGICAL HISTORY: Past Surgical History  Procedure Laterality Date  . Cholecystectomy    . Spinal fusion      ?? C3- C4    SOCIAL HISTORY:  Social History  Substance Use Topics  . Smoking status: Current Every Day Smoker  . Smokeless tobacco: Not on file  . Alcohol Use: No    FAMILY HISTORY:  Family History  Problem Relation Age of Onset  . Bladder Cancer Father     deceased    DRUG ALLERGIES: No Known Allergies  REVIEW OF SYSTEMS:   CONSTITUTIONAL: No fever, fatigue or weakness.  EYES: No blurred or double vision.   EARS, NOSE, AND THROAT: No tinnitus or ear pain.  RESPIRATORY: No cough, shortness of breath, wheezing or hemoptysis.  CARDIOVASCULAR: No chest pain, orthopnea, edema.  GASTROINTESTINAL: No nausea, vomiting, diarrhea or abdominal pain.  GENITOURINARY: No dysuria, hematuria. Has redness of scrotal wall.Scrotal wall thickening noted.Inner thigh area redness noted. ENDOCRINE: No polyuria, nocturia,  HEMATOLOGY: No anemia, easy bruising or bleeding SKIN: No rash or lesion. MUSCULOSKELETAL: No joint pain or arthritis.   NEUROLOGIC: No tingling, numbness, weakness.  PSYCHIATRY: No anxiety or depression.   MEDICATIONS AT HOME:  Prior to Admission medications   Medication Sig Start Date End Date Taking? Authorizing Provider  albuterol (PROVENTIL HFA;VENTOLIN HFA) 108 (90 Base) MCG/ACT inhaler Inhale 1 puff into the lungs every 6 (six) hours as needed for wheezing or shortness of breath.   Yes Historical Provider, MD  aspirin-acetaminophen-caffeine (EXCEDRIN MIGRAINE) [phone] MG tablet Take 1 tablet by mouth every 6 (six) hours as needed for headache.   Yes Historical Provider, MD  baclofen (LIORESAL) 10 MG tablet Take 10 mg by mouth 2 (two) times daily.   Yes Historical Provider, MD  buPROPion (WELLBUTRIN) 75 MG tablet Take 75 mg by mouth 2 (two) times daily.   Yes Historical Provider, MD  celecoxib (CELEBREX) 200 MG capsule Take 200 mg by mouth daily.   Yes Historical Provider, MD  DULoxetine (CYMBALTA) 60 MG  capsule Take 60 mg by mouth daily.   Yes Historical Provider, MD  enalapril (VASOTEC) 5 MG tablet Take 5 mg by mouth daily.   Yes Historical Provider, MD  EYELID CLEANSERS EX Apply 1 application topically every 6 (six) hours as needed.   Yes Historical Provider, MD  finasteride (PROSCAR) 5 MG tablet Take 5 mg by mouth daily.   Yes Historical Provider, MD  fluticasone (FLONASE) 50 MCG/ACT nasal spray Place 2 sprays into both nostrils daily.   Yes Historical Provider, MD   Fluticasone-Salmeterol (ADVAIR) 250-50 MCG/DOSE AEPB Inhale 1 puff into the lungs 2 (two) times daily.   Yes Historical Provider, MD  gabapentin (NEURONTIN) 300 MG capsule Take 600 mg by mouth 4 (four) times daily.   Yes Historical Provider, MD  gabapentin (NEURONTIN) 400 MG capsule Take 400 mg by mouth 3 (three) times daily.   Yes Historical Provider, MD  ibuprofen (ADVIL,MOTRIN) 200 MG tablet Take 400 mg by mouth every 6 (six) hours as needed.   Yes Historical Provider, MD  lidocaine (XYLOCAINE) 5 % ointment Apply 1 application topically as needed.   Yes Historical Provider, MD  Morphine-Naltrexone (EMBEDA) 20-0.8 MG CPCR Take 1 capsule by mouth 2 (two) times daily.   Yes Historical Provider, MD  omega-3 acid ethyl esters (LOVAZA) 1 g capsule Take 1 g by mouth daily.   Yes Historical Provider, MD  omeprazole (PRILOSEC) 40 MG capsule Take 40 mg by mouth 2 (two) times daily.   Yes Historical Provider, MD  oxyCODONE (ROXICODONE) 15 MG immediate release tablet Take 15 mg by mouth 2 (two) times daily.   Yes Historical Provider, MD  polyethylene glycol (MIRALAX / GLYCOLAX) packet Take 17 g by mouth daily.   Yes Historical Provider, MD  predniSONE (DELTASONE) 10 MG tablet Take 10 mg by mouth daily with breakfast.   Yes Historical Provider, MD  ranitidine (ZANTAC) 15 MG/ML syrup Take 4 mg/kg/day by mouth 2 (two) times daily.   Yes Historical Provider, MD  simvastatin (ZOCOR) 20 MG tablet Take 20 mg by mouth daily at 6 PM.   Yes Historical Provider, MD  tamsulosin (FLOMAX) 0.4 MG CAPS capsule Take 0.4 mg by mouth daily.   Yes Historical Provider, MD  tiotropium (SPIRIVA) 18 MCG inhalation capsule Place 18 mcg into inhaler and inhale daily.   Yes Historical Provider, MD  traZODone (DESYREL) 50 MG tablet Take 150 mg by mouth at bedtime.   Yes Historical Provider, MD  Vitamin D, Ergocalciferol, (DRISDOL) 50000 units CAPS capsule Take 50,000 Units by mouth every 7 (seven) days.   Yes Historical Provider, MD       PHYSICAL EXAMINATION:   VITAL SIGNS: Blood pressure 133/86, pulse 117, temperature 97.6 F (36.4 C), temperature source Oral, resp. rate 18, height '5\' 11"'$  (1.803 m), weight 80.287 kg (177 lb), SpO2 94 %.  GENERAL:  60 y.o.-year-old patient lying in the bed with no acute distress.  EYES: Pupils equal, round, reactive to light and accommodation. No scleral icterus. Extraocular muscles intact.  HEENT: Head atraumatic, normocephalic. Oropharynx and nasopharynx clear.  NECK:  Supple, no jugular venous distention. No thyroid enlargement, no tenderness.  LUNGS: Normal breath sounds bilaterally, no wheezing, rales,rhonchi or crepitation. No use of accessory muscles of respiration.  CARDIOVASCULAR: S1, S2 normal. No murmurs, rubs, or gallops.  ABDOMEN: Soft, nontender, nondistended. Bowel sounds present. No organomegaly or mass.  GENITOURINARY : Groin area red excoriation noted.Scrotal wall thickening noted. EXTREMITIES: No pedal edema, cyanosis, or clubbing.  NEUROLOGIC: Cranial nerves  II through XII are intact. Muscle strength 5/5 in all extremities. Sensation intact. Gait not checked.  PSYCHIATRIC: The patient is alert and oriented x 3.  SKIN: No obvious rash, lesion, or ulcer. Scrotal wall redness and inflammation noted.  LABORATORY PANEL:   CBC  Recent Labs Lab 10/25/15 2348  WBC 14.3*  HGB 13.9  HCT 41.0  PLT 257  MCV 97.1  MCH 33.0  MCHC 34.0  RDW 16.5*   ------------------------------------------------------------------------------------------------------------------  Chemistries   Recent Labs Lab 10/25/15 2348  NA 135  K 3.4*  CL 102  CO2 25  GLUCOSE 145*  BUN 16  CREATININE 1.21  CALCIUM 9.0   ------------------------------------------------------------------------------------------------------------------ estimated creatinine clearance is 69.1 mL/min (by C-G formula based on Cr of  1.21). ------------------------------------------------------------------------------------------------------------------ No results for input(s): TSH, T4TOTAL, T3FREE, THYROIDAB in the last 72 hours.  Invalid input(s): FREET3   Coagulation profile No results for input(s): INR, PROTIME in the last 168 hours. ------------------------------------------------------------------------------------------------------------------- No results for input(s): DDIMER in the last 72 hours. -------------------------------------------------------------------------------------------------------------------  Cardiac Enzymes No results for input(s): CKMB, TROPONINI, MYOGLOBIN in the last 168 hours.  Invalid input(s): CK ------------------------------------------------------------------------------------------------------------------ Invalid input(s): POCBNP  ---------------------------------------------------------------------------------------------------------------  Urinalysis    Component Value Date/Time   COLORURINE Yellow 05/26/2013 1801   APPEARANCEUR Clear 05/26/2013 1801   LABSPEC 1.010 05/26/2013 1801   PHURINE 7.0 05/26/2013 1801   GLUCOSEU Negative 05/26/2013 1801   HGBUR Negative 05/26/2013 1801   BILIRUBINUR Negative 05/26/2013 1801   KETONESUR Negative 05/26/2013 1801   PROTEINUR Negative 05/26/2013 1801   NITRITE Negative 05/26/2013 1801   LEUKOCYTESUR Negative 05/26/2013 1801     RADIOLOGY: US Scrotum  10/26/2015  CLINICAL DATA:  Bilateral scrotal swelling and erythema, acute onset. Assess for abscess. Initial encounter. EXAM: SCROTAL ULTRASOUND DOPPLER ULTRASOUND OF THE TESTICLES TECHNIQUE: Complete ultrasound examination of the testicles, epididymis, and other scrotal structures was performed. Color and spectral Doppler ultrasound were also utilized to evaluate blood flow to the testicles. COMPARISON:  None. FINDINGS: Right testicle Measurements: 3.5 x 2.0 x 2.5 cm. No mass or  microlithiasis visualized. Left testicle Measurements: 3.8 x 1.9 x 2.5 cm. No mass or microlithiasis visualized. Right epididymis:  Normal in size and appearance. Left epididymis:  Normal in size and appearance. Hydrocele:  None visualized. Varicocele:  None visualized. Pulsed Doppler interrogation of both testes demonstrates normal low resistance arterial and venous waveforms bilaterally. Mild diffuse scrotal wall edema is noted. IMPRESSION: 1. No evidence of abscess. 2. No evidence for testicular torsion. 3. Mild diffuse scrotal wall edema noted. Electronically Signed   By: Garald Balding M.D.   On: 10/26/2015 04:27   [place] Art/ven Flow Abd Pelv Doppler  10/26/2015  CLINICAL DATA:  Bilateral scrotal swelling and erythema, acute onset. Assess for abscess. Initial encounter. EXAM: SCROTAL ULTRASOUND DOPPLER ULTRASOUND OF THE TESTICLES TECHNIQUE: Complete ultrasound examination of the testicles, epididymis, and other scrotal structures was performed. Color and spectral Doppler ultrasound were also utilized to evaluate blood flow to the testicles. COMPARISON:  None. FINDINGS: Right testicle Measurements: 3.5 x 2.0 x 2.5 cm. No mass or microlithiasis visualized. Left testicle Measurements: 3.8 x 1.9 x 2.5 cm. No mass or microlithiasis visualized. Right epididymis:  Normal in size and appearance. Left epididymis:  Normal in size and appearance. Hydrocele:  None visualized. Varicocele:  None visualized. Pulsed Doppler interrogation of both testes demonstrates normal low resistance arterial and venous waveforms bilaterally. Mild diffuse scrotal wall edema is noted. IMPRESSION: 1. No evidence of abscess. 2. No evidence for testicular  torsion. 3. Mild diffuse scrotal wall edema noted. Electronically Signed   By: Garald Balding M.D.   On: 10/26/2015 04:27    EKG: Orders placed or performed in visit on 08/14/12  . EKG 12-Lead    IMPRESSION AND PLAN: 60 year old male patient with history of COPD and chronic back pain  presented with scrotal wall thickening and redness of 3 day duration. Admitting diagnosis 1. Scrotal wall cellulitis 2. Leukocytosis 3. Hypokalemia 4. Emphysema Treatment plan Admit patient to medical floor Check blood cultures Start patient on IV Ancef 2 g every 8 hourly Follow-up WBC count Replace potassium Continue breathing treatments for history of COPD If needed will start antifungal cream in the groin area or oral therapy. Supportive care  All the records are reviewed and case discussed with ED provider. Management plans discussed with the patient, family and they are in agreement.  CODE STATUS:FULL    Code Status Orders        Start     Ordered   10/26/15 0608  Full code   Continuous     10/26/15 0609    Code Status History    Date Active Date Inactive Code Status Order ID Comments User Context   This patient has a current code status but no historical code status.       TOTAL TIME TAKING CARE OF THIS PATIENT: 51 minutes.    Saundra Shelling M.D on 10/26/2015 at 6:10 AM  Between 7am to 6pm - Pager - [phone]  After 6pm go to [email or web address] - password [email or web address]  [facility]  Office  [phone]  CC: Primary care physician; No primary care provider on file.

## 2015-10-26 NOTE — Progress Notes (Addendum)
RN CALLED 2 [facility] AND [facility] TO ASSESS MED ADM. PT GETS MEDS ONLY FROM [facility] IN [place]. ONLY ACTIVE MEDS ARE PREDNSIONE,SIMVASTATIN ,PROAIR AND OMEPRAZOLE. DR Lavetta Nielsen NOTIFIED. SEE SHEET IN CHART

## 2015-10-26 NOTE — Progress Notes (Signed)
[facility] [place] NAME: Stanley Rios    MRN#:  [id number]  DATE OF BIRTH:  [birth year]  SUBJECTIVE:  Hospital Day: 0 days Stanley Rios is a 60 y.o. male presenting with Abscess . Scrotal swelling and redness  Overnight events: No overnight events Interval Events: States pain is somewhat improved this morning redness is also improved  REVIEW OF SYSTEMS:  CONSTITUTIONAL: No fever, fatigue or weakness.  EYES: No blurred or double vision.  EARS, NOSE, AND THROAT: No tinnitus or ear pain.  RESPIRATORY: No cough, shortness of breath, wheezing or hemoptysis.  CARDIOVASCULAR: No chest pain, orthopnea, edema.  GASTROINTESTINAL: No nausea, vomiting, diarrhea or abdominal pain.  GENITOURINARY: No dysuria, hematuria.  ENDOCRINE: No polyuria, nocturia,  HEMATOLOGY: No anemia, easy bruising or bleeding SKIN: No rash or lesion. MUSCULOSKELETAL: No joint pain or arthritis.   NEUROLOGIC: No tingling, numbness, weakness.  PSYCHIATRY: No anxiety or depression.   DRUG ALLERGIES:  No Known Allergies  VITALS:  Blood pressure 111/77, pulse 112, temperature 98.2 F (36.8 C), temperature source Oral, resp. rate 18, height '5\' 11"'$  (1.803 m), weight 80.287 kg (177 lb), SpO2 94 %.  PHYSICAL EXAMINATION:  VITAL SIGNS: Filed Vitals:   10/26/15 0830 10/26/15 1512  BP: 131/72 111/77  Pulse: 115 112  Temp: 98.5 F (36.9 C) 98.2 F (36.8 C)  Resp: 18 18   GENERAL:60 y.o.male currently in no acute distress.  HEAD: Normocephalic, atraumatic.  EYES: Pupils equal, round, reactive to light. Extraocular muscles intact. No scleral icterus.  MOUTH: Moist mucosal membrane. Dentition intact. No abscess noted.  EAR, NOSE, THROAT: Clear without exudates. No external lesions.  NECK: Supple. No thyromegaly. No nodules. No JVD.  PULMONARY: Clear to ascultation, without wheeze rails or rhonci. No use of accessory muscles, Good respiratory effort. good air  entry bilaterally CHEST: Nontender to palpation.  CARDIOVASCULAR: S1 and S2. Regular rate and rhythm. No murmurs, rubs, or gallops. No edema. Pedal pulses 2+ bilaterally.  GASTROINTESTINAL: Soft, nontender, nondistended. No masses. Positive bowel sounds. No hepatosplenomegaly.  GU: Scrotal edema, mild erythema, MUSCULOSKELETAL: No swelling, clubbing, or edema. Range of motion full in all extremities.  NEUROLOGIC: Cranial nerves II through XII are intact. No gross focal neurological deficits. Sensation intact. Reflexes intact.  SKIN: No ulceration, lesions, rashes, or cyanosis. Skin warm and dry. Turgor intact.  PSYCHIATRIC: Mood, affect within normal limits. The patient is awake, alert and oriented x 3. Insight, judgment intact.      LABORATORY PANEL:   CBC  Recent Labs Lab 10/25/15 2348  WBC 14.3*  HGB 13.9  HCT 41.0  PLT 257   ------------------------------------------------------------------------------------------------------------------  Chemistries   Recent Labs Lab 10/25/15 2348  NA 135  K 3.4*  CL 102  CO2 25  GLUCOSE 145*  BUN 16  CREATININE 1.21  CALCIUM 9.0   ------------------------------------------------------------------------------------------------------------------  Cardiac Enzymes No results for input(s): TROPONINI in the last 168 hours. ------------------------------------------------------------------------------------------------------------------  RADIOLOGY:  US Scrotum  10/26/2015  CLINICAL DATA:  Bilateral scrotal swelling and erythema, acute onset. Assess for abscess. Initial encounter. EXAM: SCROTAL ULTRASOUND DOPPLER ULTRASOUND OF THE TESTICLES TECHNIQUE: Complete ultrasound examination of the testicles, epididymis, and other scrotal structures was performed. Color and spectral Doppler ultrasound were also utilized to evaluate blood flow to the testicles. COMPARISON:  None. FINDINGS: Right testicle Measurements: 3.5 x 2.0 x 2.5 cm. No mass  or microlithiasis visualized. Left testicle Measurements: 3.8 x 1.9 x 2.5 cm. No mass or microlithiasis visualized. Right  epididymis:  Normal in size and appearance. Left epididymis:  Normal in size and appearance. Hydrocele:  None visualized. Varicocele:  None visualized. Pulsed Doppler interrogation of both testes demonstrates normal low resistance arterial and venous waveforms bilaterally. Mild diffuse scrotal wall edema is noted. IMPRESSION: 1. No evidence of abscess. 2. No evidence for testicular torsion. 3. Mild diffuse scrotal wall edema noted. Electronically Signed   By: Garald Balding M.D.   On: 10/26/2015 04:27   [place] Art/ven Flow Abd Pelv Doppler  10/26/2015  CLINICAL DATA:  Bilateral scrotal swelling and erythema, acute onset. Assess for abscess. Initial encounter. EXAM: SCROTAL ULTRASOUND DOPPLER ULTRASOUND OF THE TESTICLES TECHNIQUE: Complete ultrasound examination of the testicles, epididymis, and other scrotal structures was performed. Color and spectral Doppler ultrasound were also utilized to evaluate blood flow to the testicles. COMPARISON:  None. FINDINGS: Right testicle Measurements: 3.5 x 2.0 x 2.5 cm. No mass or microlithiasis visualized. Left testicle Measurements: 3.8 x 1.9 x 2.5 cm. No mass or microlithiasis visualized. Right epididymis:  Normal in size and appearance. Left epididymis:  Normal in size and appearance. Hydrocele:  None visualized. Varicocele:  None visualized. Pulsed Doppler interrogation of both testes demonstrates normal low resistance arterial and venous waveforms bilaterally. Mild diffuse scrotal wall edema is noted. IMPRESSION: 1. No evidence of abscess. 2. No evidence for testicular torsion. 3. Mild diffuse scrotal wall edema noted. Electronically Signed   By: Garald Balding M.D.   On: 10/26/2015 04:27    EKG:   Orders placed or performed in visit on 08/14/12  . EKG 12-Lead    ASSESSMENT AND PLAN:   Stanley Rios is a 60 y.o. male presenting with Abscess .  Admitted 10/26/2015 : Day #: 0 days 1. Sepsis: Meeting septic criteria on admission given leukocytosis and heart rate, secondary to cellulitis continue with current antibiotic regimen to follow culture data and adjust accordingly 2. Essential hypertension not prone 3. GERD without esophagitis PPI therapy 4. Hypokalemia: Replace 5. Venous thromboembolism prophylactic: Lovenox   All the records are reviewed and case discussed with Care Management/Social Workerr. Management plans discussed with the patient, family and they are in agreement.  CODE STATUS: full TOTAL TIME TAKING CARE OF THIS PATIENT: 28 minutes.   POSSIBLE D/C IN 1-2DAYS, DEPENDING ON CLINICAL CONDITION.   Hower,  Karenann Cai.D on 10/26/2015 at 3:24 PM  Between 7am to 6pm - Pager - [phone]  After 6pm: House Pager: - [phone]  [facility]  Office  [phone]  CC: Primary care physician; No primary care provider on file.

## 2015-10-26 NOTE — ED Provider Notes (Signed)
[facility] Emergency Department Provider Note  ____________________________________________  Time seen: Approximately 3:06 AM  I have reviewed the triage vital signs and the nursing notes.   HISTORY  Chief Complaint Abscess    HPI Stanley Rios. is a 60 y.o. male who presents to the ED from home with a chief complaint of "infection in my groin". Patient reports 3 days ago noticed a "cheese tumor" beneath his left scrotum. He squeezed it causing "something inside to bust". Reports increased swelling since. Subjective fevers 2 days ago. Patient had left over amoxicillin at home and has taken 4 doses which seems to alleviate his fever. Denies associated chest pain, shortness of breath, abdominal pain, nausea, vomiting, dysuria, diarrhea. Denies recent travel or trauma. Nothing makes his symptoms better or worse.   Past Medical History  Diagnosis Date  . COPD (chronic obstructive pulmonary disease) ([place])   . Chronic back pain   . Chronic neck pain   Denies history of diabetes  There are no active problems to display for this patient.   Past Surgical History  Procedure Laterality Date  . Cholecystectomy    . Spinal fusion      ?? C3- C4    Current Outpatient Rx  Name  Route  Sig  Dispense  Refill  . albuterol (PROVENTIL HFA;VENTOLIN HFA) 108 (90 Base) MCG/ACT inhaler   Inhalation   Inhale 1 puff into the lungs every 6 (six) hours as needed for wheezing or shortness of breath.         Marland Kitchen aspirin-acetaminophen-caffeine (EXCEDRIN MIGRAINE) 250-250-65 MG tablet   Oral   Take 1 tablet by mouth every 6 (six) hours as needed for headache.         . baclofen (LIORESAL) 10 MG tablet   Oral   Take 10 mg by mouth 2 (two) times daily.         Marland Kitchen buPROPion (WELLBUTRIN) 75 MG tablet   Oral   Take 75 mg by mouth 2 (two) times daily.         . celecoxib (CELEBREX) 200 MG capsule   Oral   Take 200 mg by mouth daily.         . DULoxetine  (CYMBALTA) 60 MG capsule   Oral   Take 60 mg by mouth daily.         . enalapril (VASOTEC) 5 MG tablet   Oral   Take 5 mg by mouth daily.         Marland Kitchen EYELID CLEANSERS EX   Apply externally   Apply 1 application topically every 6 (six) hours as needed.         . finasteride (PROSCAR) 5 MG tablet   Oral   Take 5 mg by mouth daily.         . fluticasone (FLONASE) 50 MCG/ACT nasal spray   Each Nare   Place 2 sprays into both nostrils daily.         . Fluticasone-Salmeterol (ADVAIR) 250-50 MCG/DOSE AEPB   Inhalation   Inhale 1 puff into the lungs 2 (two) times daily.         Marland Kitchen gabapentin (NEURONTIN) 300 MG capsule   Oral   Take 600 mg by mouth 4 (four) times daily.         Marland Kitchen gabapentin (NEURONTIN) 400 MG capsule   Oral   Take 400 mg by mouth 3 (three) times daily.         Marland Kitchen ibuprofen (ADVIL,MOTRIN)  200 MG tablet   Oral   Take 400 mg by mouth every 6 (six) hours as needed.         . lidocaine (XYLOCAINE) 5 % ointment   Topical   Apply 1 application topically as needed.         . Morphine-Naltrexone (EMBEDA) 20-0.8 MG CPCR   Oral   Take 1 capsule by mouth 2 (two) times daily.         Marland Kitchen omega-3 acid ethyl esters (LOVAZA) 1 g capsule   Oral   Take 1 g by mouth daily.         Marland Kitchen omeprazole (PRILOSEC) 40 MG capsule   Oral   Take 40 mg by mouth 2 (two) times daily.         Marland Kitchen oxyCODONE (ROXICODONE) 15 MG immediate release tablet   Oral   Take 15 mg by mouth 2 (two) times daily.         . polyethylene glycol (MIRALAX / GLYCOLAX) packet   Oral   Take 17 g by mouth daily.         . predniSONE (DELTASONE) 10 MG tablet   Oral   Take 10 mg by mouth daily with breakfast.         . ranitidine (ZANTAC) 15 MG/ML syrup   Oral   Take 4 mg/kg/day by mouth 2 (two) times daily.         . simvastatin (ZOCOR) 20 MG tablet   Oral   Take 20 mg by mouth daily at 6 PM.         . tamsulosin (FLOMAX) 0.4 MG CAPS capsule   Oral   Take 0.4 mg by  mouth daily.         Marland Kitchen tiotropium (SPIRIVA) 18 MCG inhalation capsule   Inhalation   Place 18 mcg into inhaler and inhale daily.         . traZODone (DESYREL) 50 MG tablet   Oral   Take 150 mg by mouth at bedtime.         . Vitamin D, Ergocalciferol, (DRISDOL) 50000 units CAPS capsule   Oral   Take 50,000 Units by mouth every 7 (seven) days.           Allergies Review of patient's allergies indicates no known allergies.  No family history on file.  Social History Social History  Substance Use Topics  . Smoking status: Current Every Day Smoker  . Smokeless tobacco: None  . Alcohol Use: No    Review of Systems  Constitutional: Positive for subjective fever. Eyes: No visual changes. ENT: No sore throat. Cardiovascular: Denies chest pain. Respiratory: Denies shortness of breath. Gastrointestinal: No abdominal pain.  No nausea, no vomiting.  No diarrhea.  No constipation. Genitourinary: Positive for groin swelling. Negative for dysuria. Musculoskeletal: Negative for back pain. Skin: Negative for rash. Neurological: Negative for headaches, focal weakness or numbness.  10-point ROS otherwise negative.  ____________________________________________   PHYSICAL EXAM:  VITAL SIGNS: ED Triage Vitals  Enc Vitals Group     BP 10/25/15 2334 116/73 mmHg     Pulse Rate 10/25/15 2334 119     Resp --      Temp 10/25/15 2334 97.6 F (36.4 C)     Temp Source 10/25/15 2334 Oral     SpO2 10/25/15 2334 96 %     Weight 10/25/15 2334 177 lb (80.287 kg)     Height 10/25/15 2334 '5\' 11"'$  (1.803 m)  Head Cir --      Peak Flow --      Pain Score 10/25/15 2334 5     Pain Loc --      Pain Edu? --      Excl. in [place]? --     Constitutional: Alert and oriented. Well appearing and in no acute distress. Eyes: Conjunctivae are normal. PERRL. EOMI. Head: Atraumatic. Nose: No congestion/rhinnorhea. Mouth/Throat: Mucous membranes are moist.  Oropharynx non-erythematous. Neck:  No stridor.   Cardiovascular: Normal rate, regular rhythm. Grossly normal heart sounds.  Good peripheral circulation. Respiratory: Normal respiratory effort.  No retractions. Lungs CTAB. Gastrointestinal: Soft and nontender. No distention. No abdominal bruits. No CVA tenderness. Genitourinary: Intertriginous rash noted to bilateral groin. Uncircumcised male. Foreskin easily retracted no. Erythema noted to bilateral scrotum. Moderate swelling to bilateral scrotum. Perineum intact. Tiny indurated area to left lateral scrotum which is likely clogged hair follicle. There is no necrosis to the patient's genitalia. Strong bilateral cremaster reflexes. Musculoskeletal: No lower extremity tenderness nor edema.  No joint effusions. Neurologic:  Normal speech and language. No gross focal neurologic deficits are appreciated. No gait instability. Skin:  Skin is warm, dry and intact. No rash noted. Psychiatric: Mood and affect are normal. Speech and behavior are normal.  ____________________________________________   LABS (all labs ordered are listed, but only abnormal results are displayed)  Labs Reviewed  CBC - Abnormal; Notable for the following:    WBC 14.3 (*)    RBC 4.23 (*)    RDW 16.5 (*)    All other components within normal limits  BASIC METABOLIC PANEL - Abnormal; Notable for the following:    Potassium 3.4 (*)    Glucose, Bld 145 (*)    All other components within normal limits  CULTURE, BLOOD (ROUTINE X 2)  CULTURE, BLOOD (ROUTINE X 2)  URINALYSIS COMPLETEWITH MICROSCOPIC ([facility] ONLY)   ____________________________________________  EKG  None ____________________________________________  RADIOLOGY  Ultrasound scrotum interpreted per Dr. Radene Knee: 1. No evidence of abscess. 2. No evidence for testicular torsion. 3. Mild diffuse scrotal wall edema noted. ____________________________________________   PROCEDURES  Procedure(s) performed: None  Critical Care performed:  No  ____________________________________________   INITIAL IMPRESSION / ASSESSMENT AND PLAN / ED COURSE  Pertinent labs & imaging results that were available during my care of the patient were reviewed by me and considered in my medical decision making (see chart for details).  60 year old male who presents with intertriginous rash to groin and scrotal swelling with erythema. There is no evidence for Fournier's gangrene. Will initiate IV antibiotic; obtain scrotal ultrasound to evaluate for abscess.  ----------------------------------------- 5:17 AM on 10/26/2015 -----------------------------------------  Updated patient of imaging results. Currently he does not have abdominal pain, obvious gangrene or crepitus; however, given subjective fevers at home, leukocytosis coupled with diffuse scrotal wall edema, will speak with hospitalist to evaluate in the emergency department for admission for IV antibiotics and monitoring for developing Fournier's gangrene. ____________________________________________   FINAL CLINICAL IMPRESSION(S) / ED DIAGNOSES  Final diagnoses:  Cellulitis of scrotum      Paulette Blanch, MD 10/26/15 [phone]

## 2015-10-26 NOTE — ED Notes (Signed)
Pt comes in with swelling to groin. Swelling starts at suprapubic area and extends to penis and down into testicles. Reports that he had a "cheese tumor" rupture at home within the last few days which has made the swelling worse.  Pt denies burning with urination, difficulty urinating, or bleeding from penis. Reports fever at home, but unsure how high it has been.

## 2015-10-27 LAB — BASIC METABOLIC PANEL
Anion gap: 4 — ABNORMAL LOW (ref 5–15)
BUN: 13 mg/dL (ref 6–20)
CO2: 29 mmol/L (ref 22–32)
CREATININE: 0.86 mg/dL (ref 0.61–1.24)
Calcium: 8.8 mg/dL — ABNORMAL LOW (ref 8.9–10.3)
Chloride: 109 mmol/L (ref 101–111)
GFR calc non Af Amer: >60 mL/min (ref >60)
GLUCOSE: 91 mg/dL (ref 65–99)
Potassium: 4.5 mmol/L (ref 3.5–5.1)
SODIUM: 142 mmol/L (ref 135–145)

## 2015-10-27 LAB — CBC
HCT: 40.5 % (ref 40.0–52.0)
HEMOGLOBIN: 13.5 g/dL (ref 13.0–18.0)
MCH: 32.5 pg (ref 26.0–34.0)
MCHC: 33.4 g/dL (ref 32.0–36.0)
MCV: 97.1 fL (ref 80.0–100.0)
PLATELETS: 259 10*3/uL (ref 150–440)
RBC: 4.17 MIL/uL — AB (ref 4.40–5.90)
RDW: 16.4 % — ABNORMAL HIGH (ref 11.5–14.5)
WBC: 10.2 10*3/uL (ref 3.8–10.6)

## 2015-10-27 MED ORDER — CLINDAMYCIN HCL 300 MG PO CAPS: 300.0000 mg | ORAL_CAPSULE | Freq: Three times a day (TID) | ORAL | Status: DC

## 2015-10-27 MED ORDER — LACTINEX PO CHEW: 1.0000 | CHEWABLE_TABLET | Freq: Three times a day (TID) | ORAL | Status: DC

## 2015-10-27 MED ORDER — AMOXICILLIN-POT CLAVULANATE 875-125 MG PO TABS: 1.0000 | ORAL_TABLET | Freq: Two times a day (BID) | ORAL | Status: DC

## 2015-10-27 NOTE — Discharge Summary (Signed)
[facility] NAME: Stanley Rios    MR#:  [id number]  DATE OF BIRTH:  1956-07-15  DATE OF ADMISSION:  10/26/2015 ADMITTING PHYSICIAN: Saundra Shelling, MD  DATE OF DISCHARGE: 10/27/2015  PRIMARY CARE PHYSICIAN: No primary care provider on file.    ADMISSION DIAGNOSIS:  Cellulitis of scrotum [N49.2] Abscess [L02.91]  DISCHARGE DIAGNOSIS:  Principal Problem:   Cellulitis   SECONDARY DIAGNOSIS:   Past Medical History  Diagnosis Date  . COPD (chronic obstructive pulmonary disease) ([place])   . Chronic back pain   . Chronic neck pain     HOSPITAL COURSE:   60 year old male with COPD and chronic back pain who presented with scrotal cellulitis. For further details please further H&P.  1. Scrotal cellulitis: He was started on Ancef. Blood cultures have been negative. Testicular ultrasound was performed which showed no evidence of abscess. Cellulitis is much improved since admission. Patient be discharged on oral antibiotics with follow-up with his PCP in one week.  2. History of COPD: Patient had no signs of exacerbation willcontinue his outpatient inhalers. He is on prednisone daily. 3. Chronic back and neck pain: Patient does follow-up with the pain clinician. He will continue outpatient medications.  4. Essential hypertension: Patient continue on lisinopril.   5. BPH: Patient will continue tamsulosin and Proscar.     DISCHARGE CONDITIONS AND DIET:  Stable for discharge home on a heart healthy diet   CONSULTS OBTAINED:     DRUG ALLERGIES:  No Known Allergies  DISCHARGE MEDICATIONS:   Current Discharge Medication List    START taking these medications   Details  amoxicillin-clavulanate (AUGMENTIN) 875-125 MG tablet Take 1 tablet by mouth 2 (two) times daily. Qty: 20 tablet, Refills: 0    clindamycin (CLEOCIN) 300 MG capsule Take 1 capsule (300 mg total) by mouth 3 (three) times daily. Qty: 30 capsule,  Refills: 0    lactobacillus acidophilus & bulgar (LACTINEX) chewable tablet Chew 1 tablet by mouth 3 (three) times daily with meals. Qty: 60 tablet, Refills: 0      CONTINUE these medications which have NOT CHANGED   Details  albuterol (PROVENTIL HFA;VENTOLIN HFA) 108 (90 Base) MCG/ACT inhaler Inhale 1 puff into the lungs every 6 (six) hours as needed for wheezing or shortness of breath. Reported on 10/26/2015    omeprazole (PRILOSEC) 40 MG capsule Take 40 mg by mouth 2 (two) times daily.    predniSONE (DELTASONE) 10 MG tablet Take 10 mg by mouth daily with breakfast. Reported on 10/26/2015    simvastatin (ZOCOR) 20 MG tablet Take 20 mg by mouth daily at 6 PM.    baclofen (LIORESAL) 10 MG tablet Take 10 mg by mouth 2 (two) times daily. Reported on 10/26/2015    buPROPion (WELLBUTRIN) 75 MG tablet Take 75 mg by mouth 2 (two) times daily. Reported on 10/26/2015    DULoxetine (CYMBALTA) 60 MG capsule Take 60 mg by mouth daily. Reported on 10/26/2015    enalapril (VASOTEC) 5 MG tablet Take 5 mg by mouth daily. Reported on 10/26/2015    finasteride (PROSCAR) 5 MG tablet Take 5 mg by mouth daily. Reported on 10/26/2015    Fluticasone-Salmeterol (ADVAIR) 250-50 MCG/DOSE AEPB Inhale 1 puff into the lungs 2 (two) times daily. Reported on 10/26/2015    gabapentin (NEURONTIN) 300 MG capsule Take 600 mg by mouth 4 (four) times daily. Reported on 10/26/2015    Morphine-Naltrexone (EMBEDA) 20-0.8 MG CPCR Take 1 capsule by mouth  2 (two) times daily. Reported on 10/26/2015    oxyCODONE (ROXICODONE) 15 MG immediate release tablet Take 15 mg by mouth 2 (two) times daily. Reported on 10/26/2015    polyethylene glycol (MIRALAX / GLYCOLAX) packet Take 17 g by mouth daily. Reported on 10/26/2015    ranitidine (ZANTAC) 15 MG/ML syrup Take 4 mg/kg/day by mouth 2 (two) times daily. Reported on 10/26/2015    tamsulosin (FLOMAX) 0.4 MG CAPS capsule Take 0.4 mg by mouth daily. Reported on 10/26/2015    tiotropium  (SPIRIVA) 18 MCG inhalation capsule Place 18 mcg into inhaler and inhale daily. Reported on 10/26/2015    traZODone (DESYREL) 50 MG tablet Take 150 mg by mouth at bedtime. Reported on 10/26/2015    Vitamin D, Ergocalciferol, (DRISDOL) 50000 units CAPS capsule Take 50,000 Units by mouth every 7 (seven) days. Reported on 10/26/2015      STOP taking these medications     aspirin-acetaminophen-caffeine (EXCEDRIN MIGRAINE) 250-250-65 MG tablet      ibuprofen (ADVIL,MOTRIN) 200 MG tablet      celecoxib (CELEBREX) 200 MG capsule      EYELID CLEANSERS EX      fluticasone (FLONASE) 50 MCG/ACT nasal spray      lidocaine (XYLOCAINE) 5 % ointment      omega-3 acid ethyl esters (LOVAZA) 1 g capsule               Today   CHIEF COMPLAINT:  Patient is doing well this morning. Patient reports cellulitis is much improved. Anus much improved since admission.   VITAL SIGNS:  Blood pressure 140/80, pulse 107, temperature 98.3 F (36.8 C), temperature source Oral, resp. rate 16, height '5\' 11"'$  (1.803 m), weight 80.287 kg (177 lb), SpO2 93 %.   REVIEW OF SYSTEMS:  Review of Systems  Constitutional: Negative for fever, chills and malaise/fatigue.  HENT: Negative for ear discharge, ear pain, hearing loss, nosebleeds and sore throat.   Eyes: Negative for blurred vision and pain.  Respiratory: Negative for cough, hemoptysis, shortness of breath and wheezing.   Cardiovascular: Negative for chest pain, palpitations and leg swelling.  Gastrointestinal: Negative for nausea, vomiting, abdominal pain, diarrhea and blood in stool.  Genitourinary: Negative for dysuria.  Musculoskeletal: Negative for back pain.  Skin:       Scrotal cellulitis much improved  Neurological: Negative for dizziness, tremors, speech change, focal weakness, seizures and headaches.  Endo/Heme/Allergies: Does not bruise/bleed easily.  Psychiatric/Behavioral: Negative for depression, suicidal ideas and hallucinations.      PHYSICAL EXAMINATION:  GENERAL:  60 y.o.-year-old patient lying in the bed with no acute distress.  NECK:  Supple, no jugular venous distention. No thyroid enlargement, no tenderness.  LUNGS: Normal breath sounds bilaterally, no wheezing, rales,rhonchi  No use of accessory muscles of respiration.  CARDIOVASCULAR: S1, S2 normal. No murmurs, rubs, or gallops.  ABDOMEN: Soft, non-tender, non-distended. Bowel sounds present. No organomegaly or mass.  EXTREMITIES: No pedal edema, cyanosis, or clubbing.  PSYCHIATRIC: The patient is alert and oriented x 3.  SKIN: minimal erythema around scrotum. No evidence of abscess. There is no pain or tenderness to palpation. Marland Kitchen   DATA REVIEW:   CBC  Recent Labs Lab 10/27/15 0430  WBC 10.2  HGB 13.5  HCT 40.5  PLT 259    Chemistries   Recent Labs Lab 10/27/15 0430  NA 142  K 4.5  CL 109  CO2 29  GLUCOSE 91  BUN 13  CREATININE 0.86  CALCIUM 8.8*    Cardiac Enzymes  No results for input(s): TROPONINI in the last 168 hours.  Microbiology Results  '@MICRORSLT48'$ @  RADIOLOGY:  US Scrotum  10/26/2015  CLINICAL DATA:  Bilateral scrotal swelling and erythema, acute onset. Assess for abscess. Initial encounter. EXAM: SCROTAL ULTRASOUND DOPPLER ULTRASOUND OF THE TESTICLES TECHNIQUE: Complete ultrasound examination of the testicles, epididymis, and other scrotal structures was performed. Color and spectral Doppler ultrasound were also utilized to evaluate blood flow to the testicles. COMPARISON:  None. FINDINGS: Right testicle Measurements: 3.5 x 2.0 x 2.5 cm. No mass or microlithiasis visualized. Left testicle Measurements: 3.8 x 1.9 x 2.5 cm. No mass or microlithiasis visualized. Right epididymis:  Normal in size and appearance. Left epididymis:  Normal in size and appearance. Hydrocele:  None visualized. Varicocele:  None visualized. Pulsed Doppler interrogation of both testes demonstrates normal low resistance arterial and venous waveforms  bilaterally. Mild diffuse scrotal wall edema is noted. IMPRESSION: 1. No evidence of abscess. 2. No evidence for testicular torsion. 3. Mild diffuse scrotal wall edema noted. Electronically Signed   By: Garald Balding M.D.   On: 10/26/2015 04:27   [place] Art/ven Flow Abd Pelv Doppler  10/26/2015  CLINICAL DATA:  Bilateral scrotal swelling and erythema, acute onset. Assess for abscess. Initial encounter. EXAM: SCROTAL ULTRASOUND DOPPLER ULTRASOUND OF THE TESTICLES TECHNIQUE: Complete ultrasound examination of the testicles, epididymis, and other scrotal structures was performed. Color and spectral Doppler ultrasound were also utilized to evaluate blood flow to the testicles. COMPARISON:  None. FINDINGS: Right testicle Measurements: 3.5 x 2.0 x 2.5 cm. No mass or microlithiasis visualized. Left testicle Measurements: 3.8 x 1.9 x 2.5 cm. No mass or microlithiasis visualized. Right epididymis:  Normal in size and appearance. Left epididymis:  Normal in size and appearance. Hydrocele:  None visualized. Varicocele:  None visualized. Pulsed Doppler interrogation of both testes demonstrates normal low resistance arterial and venous waveforms bilaterally. Mild diffuse scrotal wall edema is noted. IMPRESSION: 1. No evidence of abscess. 2. No evidence for testicular torsion. 3. Mild diffuse scrotal wall edema noted. Electronically Signed   By: Garald Balding M.D.   On: 10/26/2015 04:27      Management plans discussed with the patient and he is in agreement. Stable for discharge home  Patient should follow up with PCP in 1 week  CODE STATUS:     Code Status Orders        Start     Ordered   10/26/15 0608  Full code   Continuous     10/26/15 0609    Code Status History    Date Active Date Inactive Code Status Order ID Comments User Context   This patient has a current code status but no historical code status.      TOTAL TIME TAKING CARE OF THIS PATIENT: 35 minutes.    Note: This dictation was  prepared with Dragon dictation along with smaller phrase technology. Any transcriptional errors that result from this process are unintentional.  Florita Nitsch M.D on 10/27/2015 at 11:17 AM  Between 7am to 6pm - Pager - [phone] After 6pm go to [email or web address] - password [email or web address] [facility]  [facility]  Office  [phone]  CC: Primary care physician; No primary care provider on file.

## 2015-10-31 LAB — CULTURE, BLOOD (ROUTINE X 2)
CULTURE: NO GROWTH
CULTURE: NO GROWTH
CULTURE: NO GROWTH
Culture: NO GROWTH

## 2016-01-02 DIAGNOSIS — K219 Gastro-esophageal reflux disease without esophagitis: Secondary | ICD-10-CM | POA: Diagnosis not present

## 2016-01-02 DIAGNOSIS — I10 Essential (primary) hypertension: Secondary | ICD-10-CM | POA: Diagnosis not present

## 2016-01-02 DIAGNOSIS — J439 Emphysema, unspecified: Secondary | ICD-10-CM | POA: Diagnosis not present

## 2016-01-02 DIAGNOSIS — F329 Major depressive disorder, single episode, unspecified: Secondary | ICD-10-CM | POA: Diagnosis not present

## 2016-01-08 DIAGNOSIS — F1120 Opioid dependence, uncomplicated: Secondary | ICD-10-CM | POA: Diagnosis not present

## 2016-01-08 DIAGNOSIS — Z79891 Long term (current) use of opiate analgesic: Secondary | ICD-10-CM | POA: Diagnosis not present

## 2016-01-16 ENCOUNTER — Emergency Department: Payer: PPO

## 2016-01-16 ENCOUNTER — Emergency Department
Admission: EM | Admit: 2016-01-16 | Discharge: 2016-01-16 | Disposition: A | Discharge status: Home / Self Care | Payer: PPO | Attending: Internal Medicine | Admitting: Internal Medicine

## 2016-01-16 ENCOUNTER — Encounter: Payer: Self-pay | Admitting: Emergency Medicine

## 2016-01-16 ENCOUNTER — Other Ambulatory Visit: Payer: Self-pay

## 2016-01-16 DIAGNOSIS — R079 Chest pain, unspecified: Secondary | ICD-10-CM | POA: Diagnosis not present

## 2016-01-16 DIAGNOSIS — R197 Diarrhea, unspecified: Secondary | ICD-10-CM

## 2016-01-16 DIAGNOSIS — K4090 Unilateral inguinal hernia, without obstruction or gangrene, not specified as recurrent: Secondary | ICD-10-CM | POA: Diagnosis not present

## 2016-01-16 DIAGNOSIS — F1123 Opioid dependence with withdrawal: Secondary | ICD-10-CM

## 2016-01-16 DIAGNOSIS — R0902 Hypoxemia: Secondary | ICD-10-CM | POA: Insufficient documentation

## 2016-01-16 DIAGNOSIS — I959 Hypotension, unspecified: Secondary | ICD-10-CM | POA: Diagnosis not present

## 2016-01-16 DIAGNOSIS — R000 Tachycardia, unspecified: Secondary | ICD-10-CM

## 2016-01-16 DIAGNOSIS — K6289 Other specified diseases of anus and rectum: Secondary | ICD-10-CM | POA: Diagnosis not present

## 2016-01-16 DIAGNOSIS — K629 Disease of anus and rectum, unspecified: Secondary | ICD-10-CM

## 2016-01-16 DIAGNOSIS — R112 Nausea with vomiting, unspecified: Secondary | ICD-10-CM | POA: Diagnosis not present

## 2016-01-16 DIAGNOSIS — F1120 Opioid dependence, uncomplicated: Secondary | ICD-10-CM | POA: Diagnosis not present

## 2016-01-16 DIAGNOSIS — F1193 Opioid use, unspecified with withdrawal: Secondary | ICD-10-CM

## 2016-01-16 DIAGNOSIS — J449 Chronic obstructive pulmonary disease, unspecified: Secondary | ICD-10-CM | POA: Diagnosis not present

## 2016-01-16 DIAGNOSIS — R1110 Vomiting, unspecified: Secondary | ICD-10-CM | POA: Diagnosis not present

## 2016-01-16 DIAGNOSIS — I159 Secondary hypertension, unspecified: Secondary | ICD-10-CM

## 2016-01-16 DIAGNOSIS — I2510 Atherosclerotic heart disease of native coronary artery without angina pectoris: Secondary | ICD-10-CM | POA: Diagnosis not present

## 2016-01-16 DIAGNOSIS — I10 Essential (primary) hypertension: Secondary | ICD-10-CM | POA: Insufficient documentation

## 2016-01-16 DIAGNOSIS — I708 Atherosclerosis of other arteries: Secondary | ICD-10-CM | POA: Diagnosis not present

## 2016-01-16 DIAGNOSIS — I7090 Unspecified atherosclerosis: Secondary | ICD-10-CM

## 2016-01-16 DIAGNOSIS — Z79899 Other long term (current) drug therapy: Secondary | ICD-10-CM | POA: Insufficient documentation

## 2016-01-16 DIAGNOSIS — R1011 Right upper quadrant pain: Secondary | ICD-10-CM | POA: Diagnosis not present

## 2016-01-16 DIAGNOSIS — R109 Unspecified abdominal pain: Secondary | ICD-10-CM

## 2016-01-16 DIAGNOSIS — F17200 Nicotine dependence, unspecified, uncomplicated: Secondary | ICD-10-CM | POA: Diagnosis not present

## 2016-01-16 DIAGNOSIS — R4182 Altered mental status, unspecified: Secondary | ICD-10-CM | POA: Diagnosis not present

## 2016-01-16 DIAGNOSIS — I119 Hypertensive heart disease without heart failure: Secondary | ICD-10-CM | POA: Diagnosis not present

## 2016-01-16 HISTORY — DX: Essential (primary) hypertension: I10

## 2016-01-16 LAB — URINALYSIS COMPLETE WITH MICROSCOPIC (ARMC ONLY)
BACTERIA UA: NONE SEEN
BILIRUBIN URINE: NEGATIVE
GLUCOSE, UA: NEGATIVE mg/dL
LEUKOCYTES UA: NEGATIVE
Nitrite: NEGATIVE
Protein, ur: 100 mg/dL — AB
SPECIFIC GRAVITY, URINE: 1.024 (ref 1.005–1.030)
pH: 6 (ref 5.0–8.0)

## 2016-01-16 LAB — COMPREHENSIVE METABOLIC PANEL
ALBUMIN: 3.6 g/dL (ref 3.5–5.0)
ALT: 18 U/L (ref 17–63)
ANION GAP: 10 (ref 5–15)
AST: 40 U/L (ref 15–41)
Alkaline Phosphatase: 53 U/L (ref 38–126)
BUN: 22 mg/dL — ABNORMAL HIGH (ref 6–20)
CHLORIDE: 98 mmol/L — AB (ref 101–111)
CO2: 27 mmol/L (ref 22–32)
CREATININE: 0.83 mg/dL (ref 0.61–1.24)
Calcium: 8.7 mg/dL — ABNORMAL LOW (ref 8.9–10.3)
GFR calc non Af Amer: >60 mL/min (ref >60)
Glucose, Bld: 115 mg/dL — ABNORMAL HIGH (ref 65–99)
Potassium: 4.1 mmol/L (ref 3.5–5.1)
SODIUM: 135 mmol/L (ref 135–145)
Total Bilirubin: 0.7 mg/dL (ref 0.3–1.2)
Total Protein: 6.2 g/dL — ABNORMAL LOW (ref 6.5–8.1)

## 2016-01-16 LAB — CBC
HCT: 38.7 % — ABNORMAL LOW (ref 40.0–52.0)
HEMOGLOBIN: 13.4 g/dL (ref 13.0–18.0)
MCH: 33.1 pg (ref 26.0–34.0)
MCHC: 34.5 g/dL (ref 32.0–36.0)
MCV: 95.9 fL (ref 80.0–100.0)
Platelets: 238 10*3/uL (ref 150–440)
RBC: 4.04 MIL/uL — AB (ref 4.40–5.90)
RDW: 15.2 % — ABNORMAL HIGH (ref 11.5–14.5)
WBC: 10.2 10*3/uL (ref 3.8–10.6)

## 2016-01-16 LAB — BLOOD GAS, ARTERIAL
Acid-Base Excess: 3.9 mmol/L — ABNORMAL HIGH (ref 0.0–3.0)
Allens test (pass/fail): POSITIVE — AB
Bicarbonate: 28.5 mEq/L — ABNORMAL HIGH (ref 21.0–28.0)
FIO2: 0.21
O2 SAT: 88.3 %
PCO2 ART: 42 mmHg (ref 32.0–48.0)
PH ART: 7.44 (ref 7.350–7.450)
Patient temperature: 37
pO2, Arterial: 53 mmHg — ABNORMAL LOW (ref 83.0–108.0)

## 2016-01-16 LAB — TROPONIN I: Troponin I: 0.03 ng/mL (ref <0.031)

## 2016-01-16 LAB — LIPASE, BLOOD: LIPASE: 16 U/L (ref 11–51)

## 2016-01-16 MED ORDER — IOPAMIDOL (ISOVUE-300) INJECTION 61%
100.0000 mL | Freq: Once | INTRAVENOUS | Status: AC | PRN
  Administered 2016-01-16: 100 mL via INTRAVENOUS

## 2016-01-16 MED ORDER — PREDNISONE 20 MG PO TABS: 60.0000 mg | ORAL_TABLET | Freq: Every day | ORAL | Status: DC

## 2016-01-16 MED ORDER — MORPHINE SULFATE (PF) 4 MG/ML IV SOLN
INTRAVENOUS | Status: AC
  Filled 2016-01-16: qty 1

## 2016-01-16 MED ORDER — ONDANSETRON HCL 4 MG/2ML IJ SOLN
INTRAMUSCULAR | Status: AC
  Filled 2016-01-16: qty 2

## 2016-01-16 MED ORDER — MORPHINE SULFATE (PF) 4 MG/ML IV SOLN
4.0000 mg | Freq: Once | INTRAVENOUS | Status: AC
Start: 2016-01-16 — End: 2016-01-16
  Administered 2016-01-16: 4 mg via INTRAVENOUS

## 2016-01-16 MED ORDER — LEVOFLOXACIN 750 MG PO TABS: 750.0000 mg | ORAL_TABLET | Freq: Every day | ORAL | Status: DC

## 2016-01-16 MED ORDER — ONDANSETRON HCL 4 MG/2ML IJ SOLN
4.0000 mg | Freq: Once | INTRAMUSCULAR | Status: AC
  Administered 2016-01-16: 4 mg via INTRAVENOUS

## 2016-01-16 MED ORDER — LEVOFLOXACIN 750 MG PO TABS
750.0000 mg | ORAL_TABLET | Freq: Once | ORAL | Status: AC
  Administered 2016-01-16: 750 mg via ORAL

## 2016-01-16 MED ORDER — LABETALOL HCL 5 MG/ML IV SOLN
10.0000 mg | Freq: Once | INTRAVENOUS | Status: AC
  Administered 2016-01-16: 10 mg via INTRAVENOUS
  Filled 2016-01-16: qty 4

## 2016-01-16 MED ORDER — SODIUM CHLORIDE 0.9 % IV BOLUS (SEPSIS)
1000.0000 mL | Freq: Once | INTRAVENOUS | Status: AC
  Administered 2016-01-16: 1000 mL via INTRAVENOUS

## 2016-01-16 MED ORDER — OXYCODONE-ACETAMINOPHEN 5-325 MG PO TABS
2.0000 | ORAL_TABLET | Freq: Once | ORAL | Status: AC
  Administered 2016-01-16: 2 via ORAL
  Filled 2016-01-16: qty 2

## 2016-01-16 MED ORDER — IOPAMIDOL (ISOVUE-370) INJECTION 76%
75.0000 mL | Freq: Once | INTRAVENOUS | Status: AC | PRN
  Administered 2016-01-16: 75 mL via INTRAVENOUS

## 2016-01-16 MED ORDER — LEVOFLOXACIN 500 MG PO TABS
ORAL_TABLET | ORAL | Status: AC
  Filled 2016-01-16: qty 2

## 2016-01-16 MED ORDER — MORPHINE SULFATE (PF) 4 MG/ML IV SOLN
4.0000 mg | Freq: Once | INTRAVENOUS | Status: AC
  Administered 2016-01-16: 4 mg via INTRAVENOUS

## 2016-01-16 NOTE — ED Notes (Signed)
Pt sitting at end of bed yelling "help, I can't breathe, I can't stand it". Pt had removed self from monitor. Pt SpO2 room air 92%. Pt placed back on monitor and repositioned in a recliner in upright position. Pt placed on oxygen 2L via [place]. Dr. Lovena Le made aware.

## 2016-01-16 NOTE — ED Notes (Signed)
Pt wife given update of pt plan of care via telephone.

## 2016-01-16 NOTE — ED Provider Notes (Addendum)
[facility] Emergency Department Provider Note  ____________________________________________  Time seen: Approximately 7:55 AM  I have reviewed the triage vital signs and the nursing notes.   HISTORY  Chief Complaint Altered Mental Status; Diarrhea; and Emesis   HPI Stanley Rios. is a 60 y.o. male who is complaining of right upper quadrant abdominal pain, diarrhea, and vomiting. Patient is on multiple narcotics for chronic back pain and chronic neck pain, and we got some history from the wife that she had given him Narcan a couple of times over the past day or 2. Patient is complaining of severe pain in his right upper quadrant, but he is also slightly confused. Patient has vomited in the ER. Patient rates his pain on a scale of 0-10 as a 10. Patient denies any fever, chills, cough or cold symptoms. Patient denies any chest pain or shortness of breath. On arrival to the ER patient was tachycardic and screaming out in pain.   Past Medical History  Diagnosis Date  . COPD (chronic obstructive pulmonary disease) ([place])   . Chronic back pain   . Chronic neck pain   . Hypertension     Patient Active Problem List   Diagnosis Date Noted  . Cellulitis 10/26/2015    Past Surgical History  Procedure Laterality Date  . Cholecystectomy    . Spinal fusion      ?? C3- C4    Current Outpatient Rx  Name  Route  Sig  Dispense  Refill  . albuterol (PROVENTIL HFA;VENTOLIN HFA) 108 (90 Base) MCG/ACT inhaler   Inhalation   Inhale 2 puffs into the lungs every 4 (four) hours as needed for wheezing or shortness of breath. Reported on 10/26/2015         . baclofen (LIORESAL) 10 MG tablet   Oral   Take 10 mg by mouth 2 (two) times daily as needed for muscle spasms.          Marland Kitchen buPROPion (WELLBUTRIN) 75 MG tablet   Oral   Take 75 mg by mouth 2 (two) times daily. Reported on 10/26/2015         . celecoxib (CELEBREX) 200 MG capsule   Oral   Take 200 mg by  mouth 2 (two) times daily with a meal.         . diclofenac sodium (VOLTAREN) 1 % GEL   Topical   Apply 4 g topically 4 (four) times daily as needed (for pain).          . DULoxetine (CYMBALTA) 60 MG capsule   Oral   Take 60 mg by mouth daily. Reported on 10/26/2015         . enalapril (VASOTEC) 5 MG tablet   Oral   Take 5 mg by mouth daily.         . finasteride (PROSCAR) 5 MG tablet   Oral   Take 5 mg by mouth daily.         . Fluticasone-Salmeterol (ADVAIR) 250-50 MCG/DOSE AEPB   Inhalation   Inhale 1 puff into the lungs 2 (two) times daily. Reported on 10/26/2015         . gabapentin (NEURONTIN) 400 MG capsule   Oral   Take 800 mg by mouth 3 (three) times daily.         Marland Kitchen lidocaine (XYLOCAINE) 5 % ointment   Topical   Apply 1 application topically See admin instructions. 2 to 4 times daily         .  Morphine-Naltrexone (EMBEDA) 20-0.8 MG CPCR   Oral   Take 1 capsule by mouth 2 (two) times daily. Reported on 10/26/2015         . naloxone [facility]) 2 MG/2ML injection   Intramuscular   Inject 2 mLs into the muscle as needed. For side effect/adverse reaction/opioid overdose         . omeprazole (PRILOSEC) 40 MG capsule   Oral   Take 40 mg by mouth 2 (two) times daily.         . Oxycodone HCl 10 MG TABS   Oral   Take 10 mg by mouth every 8 (eight) hours as needed (for pain).         . predniSONE (DELTASONE) 10 MG tablet   Oral   Take 10 mg by mouth daily.         . simvastatin (ZOCOR) 20 MG tablet   Oral   Take 20 mg by mouth at bedtime.          . tamsulosin (FLOMAX) 0.4 MG CAPS capsule   Oral   Take 0.4 mg by mouth daily.         Marland Kitchen tiotropium (SPIRIVA) 18 MCG inhalation capsule   Inhalation   Place 18 mcg into inhaler and inhale daily. Reported on 10/26/2015         . traMADol (ULTRAM) 50 MG tablet   Oral   Take 50 mg by mouth daily as needed for moderate pain.          . traZODone (DESYREL) 50 MG tablet   Oral   Take  150 mg by mouth at bedtime.         Marland Kitchen amoxicillin-clavulanate (AUGMENTIN) 875-125 MG tablet   Oral   Take 1 tablet by mouth 2 (two) times daily. Patient not taking: Reported on 01/16/2016   20 tablet   0   . clindamycin (CLEOCIN) 300 MG capsule   Oral   Take 1 capsule (300 mg total) by mouth 3 (three) times daily. Patient not taking: Reported on 01/16/2016   30 capsule   0   . lactobacillus acidophilus & bulgar (LACTINEX) chewable tablet   Oral   Chew 1 tablet by mouth 3 (three) times daily with meals. Patient not taking: Reported on 01/16/2016   60 tablet   0     Allergies Review of patient's allergies indicates no known allergies.  Family History  Problem Relation Age of Onset  . Bladder Cancer Father     deceased    Social History Social History  Substance Use Topics  . Smoking status: Current Every Day Smoker  . Smokeless tobacco: None  . Alcohol Use: No    Review of Systems Constitutional: No fever/chills Eyes: No visual changes. ENT: No sore throat. Cardiovascular: Denies chest pain. Respiratory: Denies shortness of breath. Gastrointestinal: Positive for abdominal pain.  Positive for nausea and vomiting.  Positive for diarrhea.  No constipation. Genitourinary: Negative for dysuria. Musculoskeletal: Negative for back pain. Skin: Negative for rash. Neurological: Negative for headaches, focal weakness or numbness. Positive for confusion  10-point ROS otherwise negative.  ____________________________________________   PHYSICAL EXAM:  VITAL SIGNS: ED Triage Vitals  Enc Vitals Group     BP 01/16/16 0753 160/91 mmHg     Pulse Rate 01/16/16 0753 114     Resp 01/16/16 0753 18     Temp 01/16/16 0753 98.1 F (36.7 C)     Temp Source 01/16/16 0753 Oral     SpO2  01/16/16 0753 96 %     Weight 01/16/16 0753 177 lb (80.287 kg)     Height 01/16/16 0753 '5\' 11"'$  (3.299 m)     Head Cir --      Peak Flow --      Pain Score --      Pain Loc --      Pain Edu?  --      Excl. in [place]? --     Constitutional: Alert and oriented. Well appearing and in Moderate acute distress from his pain. Eyes: Conjunctivae are normal. PERRL. EOMI. Head: Atraumatic. Nose: No congestion/rhinnorhea. Mouth/Throat: Mucous membranes are moist.  Oropharynx non-erythematous. Neck: No stridor.   Cardiovascular: Rapid rate, regular rhythm. Grossly normal heart sounds.  Good peripheral circulation. Respiratory: Normal respiratory effort.  No retractions. Lungs CTAB. Gastrointestinal: Soft and tender to palpation in his right upper quadrant and midepigastric. No distention. No abdominal bruits. No CVA tenderness. Musculoskeletal: No lower extremity tenderness nor edema.  No joint effusions. Neurologic:  Normal speech and language. No gross focal neurologic deficits are appreciated. No gait instability. Skin:  Skin is warm, dry and intact. No rash noted. Psychiatric: Mood and affect are normal. Speech and behavior are normal.  ____________________________________________   LABS (all labs ordered are listed, but only abnormal results are displayed)  Labs Reviewed  COMPREHENSIVE METABOLIC PANEL - Abnormal; Notable for the following:    Chloride 98 (*)    Glucose, Bld 115 (*)    BUN 22 (*)    Calcium 8.7 (*)    Total Protein 6.2 (*)    All other components within normal limits  CBC - Abnormal; Notable for the following:    RBC 4.04 (*)    HCT 38.7 (*)    RDW 15.2 (*)    All other components within normal limits  URINALYSIS COMPLETEWITH MICROSCOPIC ([facility] ONLY) - Abnormal; Notable for the following:    Color, Urine YELLOW (*)    APPearance CLEAR (*)    Ketones, ur 2+ (*)    Hgb urine dipstick 1+ (*)    Protein, ur 100 (*)    Squamous Epithelial / LPF 0-5 (*)    All other components within normal limits  BLOOD GAS, ARTERIAL - Abnormal; Notable for the following:    pO2, Arterial 53 (*)    Bicarbonate 28.5 (*)    Acid-Base Excess 3.9 (*)    Allens test (pass/fail)  POSITIVE (*)    All other components within normal limits  LIPASE, BLOOD  TROPONIN I   ____________________________________________  EKG  ED ECG REPORT I, Ruby Cola, the attending physician, personally viewed and interpreted this ECG.   Date: 01/16/2016  EKG Time: 7:50 AM  Rate: 1:15  Rhythm: sinus tachycardia  Axis: Normal  Intervals:none  ST&T Change: Nonspecific ST changes  ____________________________________________  RADIOLOGY  Dg Chest 1 View  01/16/2016  CLINICAL DATA:  Altered mental status, vomiting, and diarrhea. EXAM: CHEST 1 VIEW COMPARISON:  07/11/2014 FINDINGS: Right cardiophrenic sulcus density is attributable to eventration of the right posterior hemidiaphragm. Tortuous thoracic aorta. Heart size within normal limits for projection. There is potentially mild atelectasis or scarring in the left lower lobe. IMPRESSION: 1. Mild atelectasis or scarring in the left lower lobe. 2. Lobularity along the right medial hemidiaphragm likely due to hemidiaphragmatic eventration. Electronically Signed   By: Van Clines M.D.   On: 01/16/2016 08:43   Ct Angio Chest Pe W Or Wo Contrast  01/16/2016  CLINICAL DATA:  COPD.  Hypoxia.  Dyspnea. EXAM: CT ANGIOGRAPHY CHEST WITH CONTRAST TECHNIQUE: Multidetector CT imaging of the chest was performed using the standard protocol during bolus administration of intravenous contrast. Multiplanar CT image reconstructions and MIPs were obtained to evaluate the vascular anatomy. CONTRAST:  75 cc Isovue 370 COMPARISON:  01/16/2016 radiographs, CT chest from 07/06/2007 FINDINGS: Mediastinum/Nodes: No filling defect is identified in the pulmonary arterial tree to suggest pulmonary embolus. Coronary, aortic arch, and branch vessel atherosclerotic vascular disease. No findings of aortic or branch vessel dissection. Borderline cardiomegaly. Lungs/Pleura: Right posterior hemidiaphragmatic herniation of adipose tissue into the right posterior chest  causing the previously seen density on chest radiography. No pleural effusion. Centrilobular emphysema. Airway thickening is present with some frothy fluid in the bronchus intermedius on the right. Subsegmental atelectasis or scarring along the lingula. Upper abdomen: Excreted contrast medium in the renal collecting systems. Musculoskeletal: Thoracic spondylosis with mild prominence of epidural adipose tissue in the thoracic spine. Review of the MIP images confirms the above findings. IMPRESSION: 1. No filling defect is identified in the pulmonary arterial tree to suggest pulmonary embolus. No acute aortic findings. 2. Coronary, aortic arch, and branch vessel atherosclerotic vascular disease. 3. Right posterior hemidiaphragmatic herniation of adipose tissue. 4. Centrilobular emphysema. 5. Airway thickening is present, suggesting bronchitis or reactive airways disease. There is some frothy fluid in the bronchus intermedius. Electronically Signed   By: Van Clines M.D.   On: 01/16/2016 12:37   Ct Abdomen Pelvis W Contrast  01/16/2016  CLINICAL DATA:  Altered mental status, diarrhea, vomiting, COPD, hypertension, smoker EXAM: CT ABDOMEN AND PELVIS WITH CONTRAST TECHNIQUE: Multidetector CT imaging of the abdomen and pelvis was performed using the standard protocol following bolus administration of intravenous contrast. Sagittal and coronal MPR images reconstructed from axial data set. CONTRAST:  183m ISOVUE-300 IOPAMIDOL (ISOVUE-300) INJECTION 61% IV. No oral contrast administered. COMPARISON:  None. FINDINGS: Lower chest: Posterior RIGHT [place] type diaphragmatic hernia containing fat. Adjacent compressive atelectasis medial RIGHT lower lobe. Hepatobiliary: Post cholecystectomy. Intrahepatic and extrahepatic biliary dilatation, CBD 11 mm diameter. No definite hepatic mass lesion. Pancreas: Single tiny parenchymal calcification at uncinate. Otherwise normal appearance. Spleen: Normal appearance  Adrenals/Urinary Tract: Small BILATERAL renal cysts largest at inferior pole LEFT kidney 14 x 14 mm image 43. Kidneys otherwise normal appearance. No hydronephrosis or urinary tract calcification. Unremarkable ureters. Minimal bladder wall prominence likely related to underdistention. Stomach/Bowel: Normal appendix. Questionable rectal wall thickening versus artifact from underdistention. Stomach decompressed. Bowel loops otherwise normal appearance. Vascular/Lymphatic: Atherosclerotic calcifications aorta and iliac arteries. Absent enhancement of the RIGHT common iliac artery question occlusion versus high-grade stenosis. No aneurysmal dilatation. No adenopathy. Reproductive: N/A Other: Large LEFT inguinal hernia containing fat. Small umbilical hernia containing fat. No free air or free fluid. No mass or definite inflammatory process. Musculoskeletal: Bones demineralized. IMPRESSION: Questionable rectal wall thickening versus artifact from underdistention, recommend correlation with sigmoidoscopy. Biliary dilatation post cholecystectomy, potentially physiologic but recommend correlation with LFTs. Umbilical, RIGHT diaphragmatic, and LEFT inguinal hernias containing fat. Aortic atherosclerosis. Question occlusion versus high-grade stenosis of the RIGHT common iliac artery. Electronically Signed   By: MLavonia DanaM.D.   On: 01/16/2016 09:41    ____________________________________________   PROCEDURES  Procedure(s) performed: None  Critical Care performed: No  ____________________________________________   INITIAL IMPRESSION / ASSESSMENT AND PLAN / ED COURSE  Pertinent labs & imaging results that were available during my care of the patient were reviewed by me and considered in my medical decision making (see chart  for details). 3:14 PM Patient was started on IV fluids as well as he was given IV morphine and Zofran for pain. We'll get routine labs, chest x-ray, EKG, and CT abdomen pelvis for further  evaluation.  3:14 PM\ Patient had questionable occlusion of his right common iliac artery from the CT abdomen pelvis as well as some questionable thickening in the rectal wall. Patient will get a Doppler of his right lower extremity arterial to make sure that he has good blood flow.  3:14 PM Ultrasound is unable to do arterial Dopplers. I called Dr. Arman Filter from vascular surgery who stated that this was not an emergency if patient was having no leg pain. He felt that this could be worked up as an outpatient. Patient's ABG showed a PO2 of 53 therefore since he is still having a resting tachycardia though I feel is probably secondary to narcotic withdrawal, we are going to get a CT angiogram of the chest to rule out PE.  3:14 PM Patient's CT was negative for PE. So patient clinically every time the pain medicines that we gave him wore off, would become tachycardic and hypertensive and complained of pain again. I felt that all large majority of his symptoms were from narcotic withdrawal. But the hypoxia was probably secondary to his years of smoking and questionable severe COPD. Patient was given IV labetalol to help bring down his blood pressure as well as his tachycardia. The hospitalist is going to admit to observation to make sure that we correct his hypertension and tachycardia and evaluate his hypoxemia. Upon discharge he will need to be referred to [place] upon discharge to evaluate his right common iliac artery occlusion as well as GI to do an endoscopy and colonoscopy to evaluate his rectal lesion.  3:14 PM Patient was admitted to the hospital per the hospitalist and he decided that he would not stay. Patient is going to sign out [place] secondary to his persistent tachycardia and hypoxemia. Patient's wife was called to come and pick him up. Patient was told that he could go home and die. ____________________________________________   FINAL CLINICAL IMPRESSION(S) / ED  DIAGNOSES  Final diagnoses:  Arterial atherosclerosis  Abdominal pain, unspecified abdominal location  Hypoxemia  Secondary hypertension, unspecified  Narcotic withdrawal ([place])  Rectal lesion      NEW MEDICATIONS STARTED DURING THIS VISIT:  New Prescriptions   No medications on file     Note:  This document was prepared using Dragon voice recognition software and may include unintentional dictation errors.    Ruby Cola, MD 01/16/16 [place], MD 01/16/16 [phone]

## 2016-01-16 NOTE — ED Notes (Signed)
Pt arrived via EMS from home for reports of altered mental status, vomiting and diarrhea. Pt wife reports she thinks pt took too much pain medication so she gave him Narcan yesterday. Pt wife reports she found pt covered in feces and vomit. EMS reports 150/90, 100 HR, NSR, CBG 146, 98.6 oral temp. Pt med list includes oxycodone, morphine, baclofen, tramadol and narcan. Pt has a history of chronic back pain.

## 2016-01-16 NOTE — ED Notes (Signed)
Patient transported to CT 

## 2016-01-16 NOTE — ED Notes (Signed)
Pt left AMA. Pt encouraged to stay for treatment but pt stated he just could not stay. Pt discharge instructions given and pt signed AMA form. Pt mother came to pick pt up.

## 2016-01-16 NOTE — ED Provider Notes (Deleted)
Progress note  01/16/2016 9:32 AM Patient is much improved and his lungs have minimal wheezing. Patient was able to walk without drop in his saturations. Patient wants to go home to continue treatment. Patient was told that if he does not do well he can return immediately to the ER. Patient was written for Zithromax at home but states that "that never works for me " and he requested Levaquin. Patient was written for Levaquin prior to discharge along with his albuterol and steroid taper.  Ruby Cola, MD 01/16/16 [phone]

## 2016-02-09 ENCOUNTER — Emergency Department: Payer: PPO

## 2016-02-09 ENCOUNTER — Encounter: Payer: Self-pay | Admitting: Emergency Medicine

## 2016-02-09 ENCOUNTER — Inpatient Hospital Stay
Admission: EM | Admit: 2016-02-09 | Discharge: 2016-02-10 | DRG: 897 | Disposition: A | Discharge status: Home / Self Care | Payer: PPO | Attending: Internal Medicine | Admitting: Internal Medicine

## 2016-02-09 DIAGNOSIS — R000 Tachycardia, unspecified: Secondary | ICD-10-CM

## 2016-02-09 DIAGNOSIS — F17210 Nicotine dependence, cigarettes, uncomplicated: Secondary | ICD-10-CM | POA: Diagnosis not present

## 2016-02-09 DIAGNOSIS — I10 Essential (primary) hypertension: Secondary | ICD-10-CM | POA: Diagnosis present

## 2016-02-09 DIAGNOSIS — Z79899 Other long term (current) drug therapy: Secondary | ICD-10-CM | POA: Diagnosis not present

## 2016-02-09 DIAGNOSIS — M542 Cervicalgia: Secondary | ICD-10-CM | POA: Diagnosis not present

## 2016-02-09 DIAGNOSIS — Z981 Arthrodesis status: Secondary | ICD-10-CM | POA: Diagnosis not present

## 2016-02-09 DIAGNOSIS — F1193 Opioid use, unspecified with withdrawal: Secondary | ICD-10-CM | POA: Diagnosis present

## 2016-02-09 DIAGNOSIS — F329 Major depressive disorder, single episode, unspecified: Secondary | ICD-10-CM | POA: Diagnosis not present

## 2016-02-09 DIAGNOSIS — D729 Disorder of white blood cells, unspecified: Secondary | ICD-10-CM | POA: Diagnosis not present

## 2016-02-09 DIAGNOSIS — F32A Depression, unspecified: Secondary | ICD-10-CM | POA: Diagnosis present

## 2016-02-09 DIAGNOSIS — J449 Chronic obstructive pulmonary disease, unspecified: Secondary | ICD-10-CM | POA: Diagnosis present

## 2016-02-09 DIAGNOSIS — R7989 Other specified abnormal findings of blood chemistry: Secondary | ICD-10-CM | POA: Diagnosis present

## 2016-02-09 DIAGNOSIS — F1123 Opioid dependence with withdrawal: Principal | ICD-10-CM | POA: Diagnosis present

## 2016-02-09 DIAGNOSIS — F419 Anxiety disorder, unspecified: Secondary | ICD-10-CM | POA: Diagnosis not present

## 2016-02-09 DIAGNOSIS — Z7952 Long term (current) use of systemic steroids: Secondary | ICD-10-CM

## 2016-02-09 DIAGNOSIS — F1810 Inhalant abuse, uncomplicated: Secondary | ICD-10-CM | POA: Diagnosis not present

## 2016-02-09 DIAGNOSIS — G8929 Other chronic pain: Secondary | ICD-10-CM | POA: Diagnosis present

## 2016-02-09 DIAGNOSIS — M549 Dorsalgia, unspecified: Secondary | ICD-10-CM | POA: Diagnosis not present

## 2016-02-09 DIAGNOSIS — R110 Nausea: Secondary | ICD-10-CM | POA: Diagnosis not present

## 2016-02-09 DIAGNOSIS — Z79891 Long term (current) use of opiate analgesic: Secondary | ICD-10-CM

## 2016-02-09 HISTORY — DX: Major depressive disorder, single episode, unspecified: F32.9

## 2016-02-09 HISTORY — DX: Opioid abuse, uncomplicated: F11.10

## 2016-02-09 HISTORY — DX: Depression, unspecified: F32.A

## 2016-02-09 HISTORY — DX: Anxiety disorder, unspecified: F41.9

## 2016-02-09 LAB — URINALYSIS COMPLETE WITH MICROSCOPIC (ARMC ONLY)
BACTERIA UA: NONE SEEN
Bilirubin Urine: NEGATIVE
Glucose, UA: NEGATIVE mg/dL
KETONES UR: NEGATIVE mg/dL
Leukocytes, UA: NEGATIVE
Nitrite: NEGATIVE
PROTEIN: NEGATIVE mg/dL
Specific Gravity, Urine: 1.058 — ABNORMAL HIGH (ref 1.005–1.030)
pH: 5 (ref 5.0–8.0)

## 2016-02-09 LAB — COMPREHENSIVE METABOLIC PANEL
ALBUMIN: 3.5 g/dL (ref 3.5–5.0)
ALT: 127 U/L — ABNORMAL HIGH (ref 17–63)
ANION GAP: 8 (ref 5–15)
AST: 87 U/L — ABNORMAL HIGH (ref 15–41)
Alkaline Phosphatase: 185 U/L — ABNORMAL HIGH (ref 38–126)
BUN: 16 mg/dL (ref 6–20)
CHLORIDE: 105 mmol/L (ref 101–111)
CO2: 24 mmol/L (ref 22–32)
Calcium: 8.5 mg/dL — ABNORMAL LOW (ref 8.9–10.3)
Creatinine, Ser: 0.96 mg/dL (ref 0.61–1.24)
GFR calc Af Amer: >60 mL/min (ref >60)
GFR calc non Af Amer: >60 mL/min (ref >60)
GLUCOSE: 84 mg/dL (ref 65–99)
POTASSIUM: 3.8 mmol/L (ref 3.5–5.1)
SODIUM: 137 mmol/L (ref 135–145)
Total Bilirubin: 1.9 mg/dL — ABNORMAL HIGH (ref 0.3–1.2)
Total Protein: 6.3 g/dL — ABNORMAL LOW (ref 6.5–8.1)

## 2016-02-09 LAB — CBC WITH DIFFERENTIAL/PLATELET
Basophils Absolute: 0 10*3/uL (ref 0–0.1)
Basophils Relative: 0 %
Eosinophils Absolute: 0.1 10*3/uL (ref 0–0.7)
Eosinophils Relative: 1 %
HCT: 47.2 % (ref 40.0–52.0)
Hemoglobin: 16 g/dL (ref 13.0–18.0)
Lymphocytes Relative: 4 %
Lymphs Abs: 0.5 10*3/uL — ABNORMAL LOW (ref 1.0–3.6)
MCH: 33.2 pg (ref 26.0–34.0)
MCHC: 33.8 g/dL (ref 32.0–36.0)
MCV: 98.1 fL (ref 80.0–100.0)
Monocytes Absolute: 0.6 10*3/uL (ref 0.2–1.0)
Monocytes Relative: 5 %
Neutro Abs: 12.2 10*3/uL — ABNORMAL HIGH (ref 1.4–6.5)
Neutrophils Relative %: 90 %
Platelets: 194 10*3/uL (ref 150–440)
RBC: 4.82 MIL/uL (ref 4.40–5.90)
RDW: 15.8 % — ABNORMAL HIGH (ref 11.5–14.5)
WBC: 13.5 10*3/uL — ABNORMAL HIGH (ref 3.8–10.6)

## 2016-02-09 LAB — ETHANOL: Alcohol, Ethyl (B): <5 mg/dL (ref <5)

## 2016-02-09 LAB — LACTIC ACID, PLASMA: Lactic Acid, Venous: 1 mmol/L (ref 0.5–1.9)

## 2016-02-09 LAB — SALICYLATE LEVEL

## 2016-02-09 LAB — TROPONIN I: Troponin I: 0.05 ng/mL (ref <0.03)

## 2016-02-09 LAB — TSH: TSH: 0.93 u[IU]/mL (ref 0.350–4.500)

## 2016-02-09 LAB — ACETAMINOPHEN LEVEL

## 2016-02-09 MED ORDER — SIMVASTATIN 20 MG PO TABS
20.0000 mg | ORAL_TABLET | Freq: Every day | ORAL | Status: DC
  Administered 2016-02-10: 20 mg via ORAL
  Filled 2016-02-09: qty 1

## 2016-02-09 MED ORDER — IOPAMIDOL (ISOVUE-370) INJECTION 76%
75.0000 mL | Freq: Once | INTRAVENOUS | Status: AC | PRN
  Administered 2016-02-09: 75 mL via INTRAVENOUS

## 2016-02-09 MED ORDER — ACETAMINOPHEN 650 MG RE SUPP
650.0000 mg | Freq: Four times a day (QID) | RECTAL | Status: DC | PRN
Start: 2016-02-09 — End: 2016-02-10

## 2016-02-09 MED ORDER — DULOXETINE HCL 30 MG PO CPEP
60.0000 mg | ORAL_CAPSULE | Freq: Every day | ORAL | Status: DC
  Administered 2016-02-10: 60 mg via ORAL
  Filled 2016-02-09: qty 2

## 2016-02-09 MED ORDER — ACETAMINOPHEN 325 MG PO TABS: 650.0000 mg | ORAL_TABLET | Freq: Four times a day (QID) | ORAL | Status: DC | PRN

## 2016-02-09 MED ORDER — SODIUM CHLORIDE 0.9 % IV BOLUS (SEPSIS)
1000.0000 mL | Freq: Once | INTRAVENOUS | Status: AC
  Administered 2016-02-09: 1000 mL via INTRAVENOUS

## 2016-02-09 MED ORDER — MORPHINE SULFATE (PF) 4 MG/ML IV SOLN: 8.0000 mg | Freq: Once | INTRAVENOUS | Status: DC

## 2016-02-09 MED ORDER — SODIUM CHLORIDE 0.9% FLUSH
3.0000 mL | Freq: Two times a day (BID) | INTRAVENOUS | Status: DC
  Administered 2016-02-10: 3 mL via INTRAVENOUS

## 2016-02-09 MED ORDER — GABAPENTIN 400 MG PO CAPS
800.0000 mg | ORAL_CAPSULE | Freq: Three times a day (TID) | ORAL | Status: DC
Start: 2016-02-10 — End: 2016-02-10
  Administered 2016-02-10 (×2): 800 mg via ORAL
  Filled 2016-02-09 (×2): qty 2

## 2016-02-09 MED ORDER — DIAZEPAM 5 MG PO TABS: 5.0000 mg | ORAL_TABLET | Freq: Two times a day (BID) | ORAL | Status: DC | PRN

## 2016-02-09 MED ORDER — ONDANSETRON HCL 4 MG PO TABS: 4.0000 mg | ORAL_TABLET | Freq: Four times a day (QID) | ORAL | Status: DC | PRN

## 2016-02-09 MED ORDER — PIPERACILLIN-TAZOBACTAM 3.375 G IVPB 30 MIN
3.3750 g | Freq: Once | INTRAVENOUS | Status: AC
  Administered 2016-02-10: 3.375 g via INTRAVENOUS
  Filled 2016-02-09: qty 50

## 2016-02-09 MED ORDER — SODIUM CHLORIDE 0.9 % IV SOLN
INTRAVENOUS | Status: AC
  Administered 2016-02-10: 01:00:00 via INTRAVENOUS

## 2016-02-09 MED ORDER — BUPROPION HCL 75 MG PO TABS
75.0000 mg | ORAL_TABLET | Freq: Two times a day (BID) | ORAL | Status: DC
  Administered 2016-02-10 (×2): 75 mg via ORAL
  Filled 2016-02-09 (×3): qty 1

## 2016-02-09 MED ORDER — MORPHINE SULFATE (PF) 10 MG/ML IV SOLN
INTRAVENOUS | Status: AC
  Filled 2016-02-09: qty 1

## 2016-02-09 MED ORDER — MOMETASONE FURO-FORMOTEROL FUM 200-5 MCG/ACT IN AERO
2.0000 | INHALATION_SPRAY | Freq: Two times a day (BID) | RESPIRATORY_TRACT | Status: DC
  Administered 2016-02-10 (×2): 2 via RESPIRATORY_TRACT
  Filled 2016-02-09: qty 8.8

## 2016-02-09 MED ORDER — TIOTROPIUM BROMIDE MONOHYDRATE 18 MCG IN CAPS
18.0000 ug | ORAL_CAPSULE | Freq: Every day | RESPIRATORY_TRACT | Status: DC
  Administered 2016-02-10: 18 ug via RESPIRATORY_TRACT
  Filled 2016-02-09: qty 5

## 2016-02-09 MED ORDER — CLONIDINE HCL 0.1 MG PO TABS
ORAL_TABLET | ORAL | Status: AC
  Administered 2016-02-09: 0.1 mg via ORAL
  Filled 2016-02-09: qty 1

## 2016-02-09 MED ORDER — MORPHINE SULFATE (PF) 4 MG/ML IV SOLN
8.0000 mg | Freq: Once | INTRAVENOUS | Status: AC
  Administered 2016-02-09: 8 mg via INTRAVENOUS
  Filled 2016-02-09: qty 2

## 2016-02-09 MED ORDER — CLONIDINE HCL 0.1 MG PO TABS
0.1000 mg | ORAL_TABLET | Freq: Four times a day (QID) | ORAL | Status: DC | PRN
Start: 2016-02-09 — End: 2016-02-10

## 2016-02-09 MED ORDER — ONDANSETRON HCL 4 MG/2ML IJ SOLN: 4.0000 mg | Freq: Four times a day (QID) | INTRAMUSCULAR | Status: DC | PRN

## 2016-02-09 MED ORDER — VANCOMYCIN HCL IN DEXTROSE 1-5 GM/200ML-% IV SOLN
1000.0000 mg | Freq: Once | INTRAVENOUS | Status: AC
  Administered 2016-02-10: 01:00:00 1000 mg via INTRAVENOUS
  Filled 2016-02-09: qty 200

## 2016-02-09 MED ORDER — CLONIDINE HCL 0.1 MG PO TABS
0.1000 mg | ORAL_TABLET | Freq: Once | ORAL | Status: AC
  Administered 2016-02-09: 0.1 mg via ORAL

## 2016-02-09 MED ORDER — PANTOPRAZOLE SODIUM 40 MG PO TBEC
40.0000 mg | DELAYED_RELEASE_TABLET | Freq: Two times a day (BID) | ORAL | Status: DC
  Administered 2016-02-10 (×2): 40 mg via ORAL
  Filled 2016-02-09 (×2): qty 1

## 2016-02-09 MED ORDER — PROMETHAZINE HCL 25 MG/ML IJ SOLN: 25.0000 mg | Freq: Four times a day (QID) | INTRAMUSCULAR | Status: DC | PRN

## 2016-02-09 MED ORDER — OXYCODONE HCL 5 MG PO TABS
10.0000 mg | ORAL_TABLET | Freq: Three times a day (TID) | ORAL | Status: DC | PRN
  Administered 2016-02-10: 10 mg via ORAL
  Filled 2016-02-09: qty 2

## 2016-02-09 MED ORDER — ENOXAPARIN SODIUM 30 MG/0.3ML ~~LOC~~ SOLN: 30.0000 mg | SUBCUTANEOUS | Status: DC

## 2016-02-09 MED ORDER — FINASTERIDE 5 MG PO TABS
5.0000 mg | ORAL_TABLET | Freq: Every day | ORAL | Status: DC
  Administered 2016-02-10: 08:00:00 5 mg via ORAL
  Filled 2016-02-09: qty 1

## 2016-02-09 MED ORDER — ENOXAPARIN SODIUM 40 MG/0.4ML ~~LOC~~ SOLN: 40.0000 mg | SUBCUTANEOUS | Status: DC

## 2016-02-09 MED ORDER — TAMSULOSIN HCL 0.4 MG PO CAPS
0.4000 mg | ORAL_CAPSULE | Freq: Every day | ORAL | Status: DC
  Administered 2016-02-10: 0.4 mg via ORAL
  Filled 2016-02-09: qty 1

## 2016-02-09 MED ORDER — DIAZEPAM 5 MG/ML IJ SOLN
INTRAMUSCULAR | Status: AC
  Administered 2016-02-09: 5 mg via INTRAVENOUS
  Filled 2016-02-09: qty 2

## 2016-02-09 MED ORDER — METOCLOPRAMIDE HCL 5 MG/ML IJ SOLN
10.0000 mg | Freq: Once | INTRAMUSCULAR | Status: AC
Start: 2016-02-09 — End: 2016-02-09
  Administered 2016-02-09: 10 mg via INTRAVENOUS
  Filled 2016-02-09: qty 2

## 2016-02-09 MED ORDER — ENALAPRIL MALEATE 2.5 MG PO TABS
5.0000 mg | ORAL_TABLET | Freq: Every day | ORAL | Status: DC
  Administered 2016-02-10: 08:00:00 5 mg via ORAL
  Filled 2016-02-09: qty 2

## 2016-02-09 MED ORDER — DIAZEPAM 5 MG/ML IJ SOLN
5.0000 mg | Freq: Once | INTRAMUSCULAR | Status: AC
  Administered 2016-02-09: 5 mg via INTRAVENOUS
  Filled 2016-02-09: qty 2

## 2016-02-09 NOTE — ED Notes (Signed)
Pt placed on 2L O2 at this time.

## 2016-02-09 NOTE — ED Notes (Signed)
Pt here from home via [facility] for opiod detox; pt reports daily use x4 years, reports last use was yesterday. Pt denies SI or HI, denies hallucinations.

## 2016-02-09 NOTE — ED Notes (Signed)
Transporting patient to 1C

## 2016-02-09 NOTE — ED Notes (Addendum)
Pt resting in bed at this time. Eyes closed, respirations even and unlabored. Will continue to monitor for further patient needs.

## 2016-02-09 NOTE — ED Notes (Signed)
MD to bedside due to patient HR increasing to 170. Pt states he felt some relief with the valium. Received verbal order for '8mg'$  of morphine at this time.

## 2016-02-09 NOTE — ED Notes (Signed)
Report called to [place], Therapist, sports.

## 2016-02-09 NOTE — H&P (Signed)
[facility] NAME: Stanley Rios    MR#:  [id number]  DATE OF BIRTH:  12-18-1955  DATE OF ADMISSION:  02/09/2016  PRIMARY CARE PHYSICIAN: Marygrace Drought, MD   REQUESTING/REFERRING PHYSICIAN: Clearnce Hasten, MD  CHIEF COMPLAINT:   Chief Complaint  Patient presents with  . Drug Problem    HISTORY OF PRESENT ILLNESS:  Stanley Rios  is a 60 y.o. male who presents with A couple of days of malaise, pain, difficulty urinating, nausea. Patient does have a history of opiate withdrawal in the past, and was recently seen for the same, but left AMA prior to being treated for it. He has a history of receiving blocks on shots by his wife in the past for the same. On evaluation in the ED today he is tachycardic, and also has an elevated white blood cell count with bandemia on differential. Source of infection is unclear at this time. CT of his chest showed no evidence for pulmonary embolus, and also did not show any consolidation or signs of pneumonia. Patient does have a history of BPH, and has had some difficulty urinating over the past couple of days. His urine studies are still pending as he has not been able to give a sample at this time. Given his complex picture hospitalists were called for admission and further evaluation and treatment.  PAST MEDICAL HISTORY:   Past Medical History  Diagnosis Date  . COPD (chronic obstructive pulmonary disease) ([place])   . Chronic back pain   . Chronic neck pain   . Hypertension   . Depression   . Anxiety   . Opiate abuse, continuous     PAST SURGICAL HISTORY:   Past Surgical History  Procedure Laterality Date  . Cholecystectomy    . Spinal fusion      ?? C3- C4    SOCIAL HISTORY:   Social History  Substance Use Topics  . Smoking status: Current Every Day Smoker  . Smokeless tobacco: Not on file  . Alcohol Use: No    FAMILY HISTORY:   Family History  Problem Relation Age of Onset   . Bladder Cancer Father     deceased    DRUG ALLERGIES:  No Known Allergies  MEDICATIONS AT HOME:   Prior to Admission medications   Medication Sig Start Date End Date Taking? Authorizing Provider  albuterol (PROVENTIL HFA;VENTOLIN HFA) 108 (90 Base) MCG/ACT inhaler Inhale 2 puffs into the lungs every 4 (four) hours as needed for wheezing or shortness of breath. Reported on 10/26/2015    Historical Provider, MD  amoxicillin-clavulanate (AUGMENTIN) 875-125 MG tablet Take 1 tablet by mouth 2 (two) times daily. Patient not taking: Reported on 01/16/2016 10/27/15   Bettey Costa, MD  baclofen (LIORESAL) 10 MG tablet Take 10 mg by mouth 2 (two) times daily as needed for muscle spasms.     Historical Provider, MD  buPROPion (WELLBUTRIN) 75 MG tablet Take 75 mg by mouth 2 (two) times daily. Reported on 10/26/2015    Historical Provider, MD  celecoxib (CELEBREX) 200 MG capsule Take 200 mg by mouth 2 (two) times daily with a meal.    Historical Provider, MD  clindamycin (CLEOCIN) 300 MG capsule Take 1 capsule (300 mg total) by mouth 3 (three) times daily. Patient not taking: Reported on 01/16/2016 10/27/15   Bettey Costa, MD  diclofenac sodium (VOLTAREN) 1 % GEL Apply 4 g topically 4 (four) times daily as needed (for pain).  Historical Provider, MD  DULoxetine (CYMBALTA) 60 MG capsule Take 60 mg by mouth daily. Reported on 10/26/2015    Historical Provider, MD  enalapril (VASOTEC) 5 MG tablet Take 5 mg by mouth daily.    Historical Provider, MD  finasteride (PROSCAR) 5 MG tablet Take 5 mg by mouth daily.    Historical Provider, MD  Fluticasone-Salmeterol (ADVAIR) 250-50 MCG/DOSE AEPB Inhale 1 puff into the lungs 2 (two) times daily. Reported on 10/26/2015    Historical Provider, MD  gabapentin (NEURONTIN) 400 MG capsule Take 800 mg by mouth 3 (three) times daily.    Historical Provider, MD  lactobacillus acidophilus & bulgar (LACTINEX) chewable tablet Chew 1 tablet by mouth 3 (three) times daily with  meals. Patient not taking: Reported on 01/16/2016 10/27/15   Bettey Costa, MD  lidocaine (XYLOCAINE) 5 % ointment Apply 1 application topically See admin instructions. 2 to 4 times daily    Historical Provider, MD  Morphine-Naltrexone (EMBEDA) 20-0.8 MG CPCR Take 1 capsule by mouth 2 (two) times daily. Reported on 10/26/2015    Historical Provider, MD  naloxone [facility]) 2 MG/2ML injection Inject 2 mLs into the muscle as needed. For side effect/adverse reaction/opioid overdose    Historical Provider, MD  omeprazole (PRILOSEC) 40 MG capsule Take 40 mg by mouth 2 (two) times daily.    Historical Provider, MD  Oxycodone HCl 10 MG TABS Take 10 mg by mouth every 8 (eight) hours as needed (for pain).    Historical Provider, MD  predniSONE (DELTASONE) 10 MG tablet Take 10 mg by mouth daily.    Historical Provider, MD  simvastatin (ZOCOR) 20 MG tablet Take 20 mg by mouth at bedtime.     Historical Provider, MD  tamsulosin (FLOMAX) 0.4 MG CAPS capsule Take 0.4 mg by mouth daily.    Historical Provider, MD  tiotropium (SPIRIVA) 18 MCG inhalation capsule Place 18 mcg into inhaler and inhale daily. Reported on 10/26/2015    Historical Provider, MD  traMADol (ULTRAM) 50 MG tablet Take 50 mg by mouth daily as needed for moderate pain.     Historical Provider, MD  traZODone (DESYREL) 50 MG tablet Take 150 mg by mouth at bedtime.    Historical Provider, MD    REVIEW OF SYSTEMS:  Review of Systems  Constitutional: Positive for malaise/fatigue. Negative for fever, chills and weight loss.  HENT: Negative for ear pain, hearing loss and tinnitus.   Eyes: Negative for blurred vision, double vision, pain and redness.  Respiratory: Negative for cough, hemoptysis and shortness of breath.   Cardiovascular: Negative for chest pain, palpitations, orthopnea and leg swelling.       Chest discomfort  Gastrointestinal: Positive for nausea. Negative for vomiting, abdominal pain, diarrhea and constipation.  Genitourinary: Negative  for dysuria, frequency and hematuria.       Difficulty urinating  Musculoskeletal: Negative for back pain, joint pain and neck pain.  Skin:       No acne, rash, or lesions  Neurological: Negative for dizziness, tremors, focal weakness and weakness.  Endo/Heme/Allergies: Negative for polydipsia. Does not bruise/bleed easily.  Psychiatric/Behavioral: Negative for depression. The patient is not nervous/anxious and does not have insomnia.      VITAL SIGNS:   Filed Vitals:   02/09/16 1900 02/09/16 1930 02/09/16 2000 02/09/16 2107  BP: 141/74 126/69 147/77 139/82  Pulse: 153 144  146  Temp:      TempSrc:      Resp: '24 21 20 22  '$ Height:  Weight:      SpO2: 96% 92%  90%   Wt Readings from Last 3 Encounters:  02/09/16 78.019 kg (172 lb)  01/16/16 80.287 kg (177 lb)  10/25/15 80.287 kg (177 lb)    PHYSICAL EXAMINATION:  Physical Exam  Vitals reviewed. Constitutional: He is oriented to person, place, and time. He appears well-developed and well-nourished. No distress.  HENT:  Head: Normocephalic and atraumatic.  Mouth/Throat: Oropharynx is clear and moist.  Eyes: Conjunctivae and EOM are normal. Pupils are equal, round, and reactive to light. No scleral icterus.  Neck: Normal range of motion. Neck supple. No JVD present. No thyromegaly present.  Cardiovascular: Regular rhythm and intact distal pulses.  Exam reveals no gallop and no friction rub.   No murmur heard. Tachycardic  Respiratory: Effort normal and breath sounds normal. No respiratory distress. He has no wheezes. He has no rales.  GI: Soft. Bowel sounds are normal. He exhibits no distension. There is no tenderness.  Musculoskeletal: Normal range of motion. He exhibits no edema.  No arthritis, no gout  Lymphadenopathy:    He has no cervical adenopathy.  Neurological: He is alert and oriented to person, place, and time. No cranial nerve deficit.  No dysarthria, no aphasia  Skin: Skin is warm and dry. No rash noted. No  erythema.  Psychiatric: He has a normal mood and affect. His behavior is normal. Judgment and thought content normal.    LABORATORY PANEL:   CBC  Recent Labs Lab 02/09/16 1803  WBC 13.5*  HGB 16.0  HCT 47.2  PLT 194   ------------------------------------------------------------------------------------------------------------------  Chemistries   Recent Labs Lab 02/09/16 1914  NA 137  K 3.8  CL 105  CO2 24  GLUCOSE 84  BUN 16  CREATININE 0.96  CALCIUM 8.5*  AST 87*  ALT 127*  ALKPHOS 185*  BILITOT 1.9*   ------------------------------------------------------------------------------------------------------------------  Cardiac Enzymes  Recent Labs Lab 02/09/16 1914  TROPONINI 0.05*   ------------------------------------------------------------------------------------------------------------------  RADIOLOGY:  Dg Chest 1 View  02/09/2016  CLINICAL DATA:  60 year old male with rapid heart rate EXAM: CHEST 1 VIEW COMPARISON:  Chest CT dated 01/16/2016 FINDINGS: Single portable view of the chest demonstrates clear lungs. The costophrenic angles have been excluded from the image. A somewhat rounded density in the right cardiophrenic angle corresponds to the herniated abdominal fat. Is no pneumothorax. The cardiac silhouette is within normal limits. Lower cervical fixation plate and screws. No acute osseous pathology. IMPRESSION: No active disease. Electronically Signed   By: Anner Crete M.D.   On: 02/09/2016 19:26   Ct Angio Chest Pe W/cm &/or Wo Cm  02/09/2016  CLINICAL DATA:  60 year old male with sinus tachycardia, abnormal troponin. Recent opioid detox. Initial encounter. EXAM: CT ANGIOGRAPHY CHEST WITH CONTRAST TECHNIQUE: Multidetector CT imaging of the chest was performed using the standard protocol during bolus administration of intravenous contrast. Multiplanar CT image reconstructions and MIPs were obtained to evaluate the vascular anatomy. CONTRAST:  75 mL  Isovue 370 COMPARISON:  Chest CTA 01/16/2016 and earlier. FINDINGS: Good contrast bolus timing in the pulmonary arterial tree. No focal filling defect identified in the pulmonary arteries to suggest the acute pulmonary embolism. Abnormal lung parenchyma is suggestive of a combination of upper lobe centrilobular emphysema with occasional bullous or cystic lesions (right upper lobe series 6, image 55), stable from recent study. Mild bilateral peribronchial thickening. Mild perihilar bronchiectasis. Fat containing right Bochdalek's hernia again noted with adjacent medial basal segment right lower lobe atelectasis. No acute pulmonary opacity. No  pericardial effusion. There is calcified coronary artery atherosclerosis. There is soft and calcified aortic atherosclerosis. No thoracic lymphadenopathy. No thoracic aortic dissection or aneurysm. Stable visualized liver, spleen, pancreas, adrenal glands, kidneys, and bowel in the upper abdomen. Lower cervical ACDF. Osteopenia. No acute osseous abnormality identified. Review of the MIP images confirms the above findings. IMPRESSION: 1.  No evidence of acute pulmonary embolus. 2. Calcified Coronary artery and aortic atherosclerosis. No thoracic aortic dissection or aneurysm. 3. Stable pulmonary findings including emphysema, mild perihilar bronchiectasis and peribronchial thickening. Electronically Signed   By: Genevie Ann M.D.   On: 02/09/2016 20:52    EKG:   Orders placed or performed during the hospital encounter of 01/16/16  . ED EKG within 10 minutes  . ED EKG within 10 minutes    IMPRESSION AND PLAN:  Principal Problem:   Opiate withdrawal ([place]) - she has a history of the same and states that his symptoms feel similar to his opiate withdrawal in the past. We will treat symptoms with supportive treatment Active Problems:   Chronic pain - treated when necessary, prefer home meds if possible, and use narcotics only as per opiate withdrawal treatment.   Neutrophilic  leukocytosis - patient technically meets sepsis criteria with tachycardia and leukocytosis with bandemia, some suspicion for the same. Unclear source at this point. Chest was clear of any signs of infection. Urine studies are pending, so he could have a UTI as he does have a history of BPH and has had some urinary difficulty. We will collect UA and urine and blood cultures, lactic acid, we will give him 1 dose of antibiotics to start with.   COPD (chronic obstructive pulmonary disease) ([place]) - continue home inhalers   HTN (hypertension) - continue home meds   Anxiety - symptomatic treatment of opioid withdrawal above includes benzos.   Depression - continue home meds  All the records are reviewed and case discussed with ED provider. Management plans discussed with the patient and/or family.  DVT PROPHYLAXIS: SubQ lovenox  GI PROPHYLAXIS: PPI - home dose  ADMISSION STATUS: Inpatient  CODE STATUS: Full Code Status History    Date Active Date Inactive Code Status Order ID Comments User Context   10/26/2015  6:09 AM 10/27/2015  6:19 PM Full Code [id number]  Saundra Shelling, MD ED      TOTAL TIME TAKING CARE OF THIS PATIENT: 45 minutes.    Sally-Anne Wamble FIELDING 02/09/2016, 9:31 PM  [facility]  Office  [phone]  CC: Primary care physician; Marygrace Drought, MD

## 2016-02-09 NOTE — ED Provider Notes (Signed)
[facility] Emergency Department Provider Note   ____________________________________________  Time seen: Approximately 7:39 PM  I have reviewed the triage vital signs and the nursing notes.   HISTORY  Chief Complaint Drug Problem   HPI Stanley Rios. is a 60 y.o. male with a history of COPD as well as opiate abuse who is presenting to the emergency department for complaints of opiate withdrawal. He says his last Embeda was this morning at 5 AM. He says that he is also been taking oxycodone 2-3 times per day. He denies any alcohol. He denies any pain except for leg cramps at this time. Denies any chest pain or shortness of breath. Came into the emergency department today requesting opiate detox. He was seen for similar complaint in June and was found to be tachycardic but left AMA before he could be treated. Denies any other illicit drugs. Denies any suicidal or homicidal ideation.  Per his record, his wife has had to give him Narcan several times for opiate overdose.   Past Medical History  Diagnosis Date  . COPD (chronic obstructive pulmonary disease) ([place])   . Chronic back pain   . Chronic neck pain   . Hypertension   . Depression   . Anxiety   . Opiate abuse, continuous     Patient Active Problem List   Diagnosis Date Noted  . Opiate withdrawal ([place]) 02/09/2016  . COPD (chronic obstructive pulmonary disease) ([place]) 02/09/2016  . HTN (hypertension) 02/09/2016  . Depression 02/09/2016  . Anxiety 02/09/2016  . Chronic pain 02/09/2016  . Neutrophilic leukocytosis 16/04/9603  . Cellulitis 10/26/2015    Past Surgical History  Procedure Laterality Date  . Cholecystectomy    . Spinal fusion      ?? C3- C4    Current Outpatient Rx  Name  Route  Sig  Dispense  Refill  . albuterol (PROVENTIL HFA;VENTOLIN HFA) 108 (90 Base) MCG/ACT inhaler   Inhalation   Inhale 2 puffs into the lungs every 4 (four) hours as needed for wheezing or  shortness of breath. Reported on 10/26/2015         . baclofen (LIORESAL) 10 MG tablet   Oral   Take 10 mg by mouth 2 (two) times daily as needed for muscle spasms.          Marland Kitchen buPROPion (WELLBUTRIN) 75 MG tablet   Oral   Take 75 mg by mouth 2 (two) times daily. Reported on 10/26/2015         . celecoxib (CELEBREX) 200 MG capsule   Oral   Take 200 mg by mouth 2 (two) times daily with a meal.         . diclofenac sodium (VOLTAREN) 1 % GEL   Topical   Apply 4 g topically 4 (four) times daily as needed (for pain).          . DULoxetine (CYMBALTA) 60 MG capsule   Oral   Take 60 mg by mouth daily. Reported on 10/26/2015         . enalapril (VASOTEC) 5 MG tablet   Oral   Take 5 mg by mouth daily.         . finasteride (PROSCAR) 5 MG tablet   Oral   Take 5 mg by mouth daily.         . Fluticasone-Salmeterol (ADVAIR) 250-50 MCG/DOSE AEPB   Inhalation   Inhale 1 puff into the lungs 2 (two) times daily. Reported on 10/26/2015         .  gabapentin (NEURONTIN) 400 MG capsule   Oral   Take 800 mg by mouth 3 (three) times daily.         Marland Kitchen lidocaine (XYLOCAINE) 5 % ointment   Topical   Apply 1 application topically See admin instructions. 2 to 4 times daily         . Morphine-Naltrexone (EMBEDA) 20-0.8 MG CPCR   Oral   Take 1 capsule by mouth 2 (two) times daily. Reported on 10/26/2015         . naloxone [facility]) 2 MG/2ML injection   Intramuscular   Inject 2 mLs into the muscle as needed. For side effect/adverse reaction/opioid overdose         . omeprazole (PRILOSEC) 40 MG capsule   Oral   Take 40 mg by mouth 2 (two) times daily.         . Oxycodone HCl 10 MG TABS   Oral   Take 10 mg by mouth every 8 (eight) hours as needed (for pain).         . predniSONE (DELTASONE) 10 MG tablet   Oral   Take 10 mg by mouth daily.         . simvastatin (ZOCOR) 20 MG tablet   Oral   Take 20 mg by mouth at bedtime.          . tamsulosin (FLOMAX) 0.4 MG  CAPS capsule   Oral   Take 0.4 mg by mouth daily.         Marland Kitchen tiotropium (SPIRIVA) 18 MCG inhalation capsule   Inhalation   Place 18 mcg into inhaler and inhale daily. Reported on 10/26/2015         . traMADol (ULTRAM) 50 MG tablet   Oral   Take 50 mg by mouth daily as needed for moderate pain.          . traZODone (DESYREL) 50 MG tablet   Oral   Take 150 mg by mouth at bedtime.         Marland Kitchen amoxicillin-clavulanate (AUGMENTIN) 875-125 MG tablet   Oral   Take 1 tablet by mouth 2 (two) times daily. Patient not taking: Reported on 01/16/2016   20 tablet   0   . clindamycin (CLEOCIN) 300 MG capsule   Oral   Take 1 capsule (300 mg total) by mouth 3 (three) times daily. Patient not taking: Reported on 01/16/2016   30 capsule   0   . lactobacillus acidophilus & bulgar (LACTINEX) chewable tablet   Oral   Chew 1 tablet by mouth 3 (three) times daily with meals. Patient not taking: Reported on 01/16/2016   60 tablet   0     Allergies Review of patient's allergies indicates no known allergies.  Family History  Problem Relation Age of Onset  . Bladder Cancer Father     deceased    Social History Social History  Substance Use Topics  . Smoking status: Current Every Day Smoker  . Smokeless tobacco: None  . Alcohol Use: No    Review of Systems Constitutional: No fever/chills Eyes: No visual changes. ENT: No sore throat. Cardiovascular: Denies chest pain. Respiratory: Denies shortness of breath. Gastrointestinal: No abdominal pain.  No nausea, no vomiting.  No diarrhea.  No constipation. Genitourinary: Negative for dysuria. Musculoskeletal: Negative for back pain. Skin: Negative for rash. Neurological: Negative for headaches, focal weakness or numbness.  10-point ROS otherwise negative.  ____________________________________________   PHYSICAL EXAM:  VITAL SIGNS: ED Triage Vitals  Enc  Vitals Group     BP 02/09/16 1718 109/87 mmHg     Pulse Rate 02/09/16  1718 145     Resp 02/09/16 1718 24     Temp 02/09/16 1718 98.1 F (36.7 C)     Temp Source 02/09/16 1718 Oral     SpO2 02/09/16 1718 96 %     Weight 02/09/16 1718 172 lb (78.019 kg)     Height 02/09/16 1718 '5\' 11"'$  (1.803 m)     Head Cir --      Peak Flow --      Pain Score 02/09/16 1719 5     Pain Loc --      Pain Edu? --      Excl. in [place]? --     Constitutional: Alert and oriented. Diaphoretic. No acute distress. Eyes: Conjunctivae are normal. PERRL. EOMI. Head: Atraumatic. Nose: No congestion/rhinnorhea. Mouth/Throat: Mucous membranes are moist.  Oropharynx non-erythematous. Neck: No stridor.   Cardiovascular: Tachycardic, regular rhythm. Grossly normal heart sounds.  Good peripheral circulation. Respiratory: Normal respiratory effort.  No retractions. Lungs CTAB. Gastrointestinal: Soft and nontender. No distention.  No CVA tenderness. Musculoskeletal: No lower extremity tenderness nor edema.  No joint effusions. Neurologic:  Normal speech and language. No gross focal neurologic deficits are appreciated. Skin:  Skin is warm, dry and intact. No rash noted. Psychiatric: Mood and affect are normal. Speech and behavior are normal.  ____________________________________________   LABS (all labs ordered are listed, but only abnormal results are displayed)  Labs Reviewed  CBC WITH DIFFERENTIAL/PLATELET - Abnormal; Notable for the following:    WBC 13.5 (*)    RDW 15.8 (*)    Neutro Abs 12.2 (*)    Lymphs Abs 0.5 (*)    All other components within normal limits  ACETAMINOPHEN LEVEL - Abnormal; Notable for the following:    Acetaminophen (Tylenol), Serum <10 (*)    All other components within normal limits  COMPREHENSIVE METABOLIC PANEL - Abnormal; Notable for the following:    Calcium 8.5 (*)    Total Protein 6.3 (*)    AST 87 (*)    ALT 127 (*)    Alkaline Phosphatase 185 (*)    Total Bilirubin 1.9 (*)    All other components within normal limits  TROPONIN I - Abnormal;  Notable for the following:    Troponin I 0.05 (*)    All other components within normal limits  CULTURE, BLOOD (ROUTINE X 2)  CULTURE, BLOOD (ROUTINE X 2)  ETHANOL  SALICYLATE LEVEL  TSH  URINE DRUG SCREEN, QUALITATIVE ([facility] ONLY)  URINALYSIS COMPLETEWITH MICROSCOPIC ([facility] ONLY)  LACTIC ACID, PLASMA   ____________________________________________  EKG  ED ECG REPORT I, Doran Stabler, the attending physician, personally viewed and interpreted this ECG.   Date: 02/09/2016  EKG Time: 1744  Rate: 143  Rhythm: sinus tachycardia  Axis: Normal  Intervals:right bundle branch block and left anterior fascicular block  ST&T Change: No ST segment elevation or depression. No abnormal T-wave inversion.  Similar appearance to the EKG done on June 21.  ____________________________________________  RADIOLOGY     DG Chest 1 View (Final result) Result time: 02/09/16 19:26:33   Final result by Rad Results In Interface (02/09/16 19:26:33)   Narrative:   CLINICAL DATA: 60 year old male with rapid heart rate  EXAM: CHEST 1 VIEW  COMPARISON: Chest CT dated 01/16/2016  FINDINGS: Single portable view of the chest demonstrates clear lungs. The costophrenic angles have been excluded from the image. A  somewhat rounded density in the right cardiophrenic angle corresponds to the herniated abdominal fat. Is no pneumothorax. The cardiac silhouette is within normal limits. Lower cervical fixation plate and screws. No acute osseous pathology.  IMPRESSION: No active disease.   Electronically Signed By: Anner Crete M.D. On: 02/09/2016 19:26      CT Angio Chest PE W/Cm &/Or Wo Cm (Final result) Result time: 02/09/16 20:52:32   Final result by Rad Results In Interface (02/09/16 20:52:32)   Narrative:   CLINICAL DATA: 60 year old male with sinus tachycardia, abnormal troponin. Recent opioid detox. Initial encounter.  EXAM: CT ANGIOGRAPHY CHEST WITH  CONTRAST  TECHNIQUE: Multidetector CT imaging of the chest was performed using the standard protocol during bolus administration of intravenous contrast. Multiplanar CT image reconstructions and MIPs were obtained to evaluate the vascular anatomy.  CONTRAST: 75 mL Isovue 370  COMPARISON: Chest CTA 01/16/2016 and earlier.  FINDINGS: Good contrast bolus timing in the pulmonary arterial tree.  No focal filling defect identified in the pulmonary arteries to suggest the acute pulmonary embolism.  Abnormal lung parenchyma is suggestive of a combination of upper lobe centrilobular emphysema with occasional bullous or cystic lesions (right upper lobe series 6, image 55), stable from recent study. Mild bilateral peribronchial thickening. Mild perihilar bronchiectasis. Fat containing right Bochdalek's hernia again noted with adjacent medial basal segment right lower lobe atelectasis. No acute pulmonary opacity.  No pericardial effusion. There is calcified coronary artery atherosclerosis. There is soft and calcified aortic atherosclerosis. No thoracic lymphadenopathy. No thoracic aortic dissection or aneurysm.  Stable visualized liver, spleen, pancreas, adrenal glands, kidneys, and bowel in the upper abdomen.  Lower cervical ACDF. Osteopenia. No acute osseous abnormality identified.  Review of the MIP images confirms the above findings.  IMPRESSION: 1. No evidence of acute pulmonary embolus. 2. Calcified Coronary artery and aortic atherosclerosis. No thoracic aortic dissection or aneurysm. 3. Stable pulmonary findings including emphysema, mild perihilar bronchiectasis and peribronchial thickening.   Electronically Signed By: Genevie Ann M.D. On: 02/09/2016 20:52     ____________________________________________   PROCEDURES  Procedures  CRITICAL CARE Performed by: Doran Stabler   Total critical care time: 35 minutes  Critical care time was exclusive of  separately billable procedures and treating other patients.  Critical care was necessary to treat or prevent imminent or life-threatening deterioration.  Critical care was time spent personally by me on the following activities: development of treatment plan with patient and/or surrogate as well as nursing, discussions with consultants, evaluation of patient's response to treatment, examination of patient, obtaining history from patient or surrogate, ordering and performing treatments and interventions, ordering and review of laboratory studies, ordering and review of radiographic studies, pulse oximetry and re-evaluation of patient's condition.  ____________________________________________   INITIAL IMPRESSION / ASSESSMENT AND PLAN / ED COURSE  Pertinent labs & imaging results that were available during my care of the patient were reviewed by me and considered in my medical decision making (see chart for details).  ----------------------------------------- 7:43 PM on 02/09/2016 -----------------------------------------  Patient's heart rate became tachycardic with P waves on the monitor up to the 170s. Given Valium and then reassessed with minimal improvement in heart rate. Then given 8 mg of morphine with heart rate now down in the 140s. Also reviewed patient's old records and had a CT angiography done in June which was negative for PE.  ----------------------------------------- 9:53 PM on 02/09/2016 -----------------------------------------  Patients clinical condition improved with morphine.  Because of his elevated troponin over to CAT scan,  angiography, the chest which was negative for PE. Patient likely with a withdrawal syndrome. Will be admitted to the hospital because of his tachycardia. Signed out to Dr. Jannifer Franklin. Patient aware of need to stay at the hospital understands what to comply. ____________________________________________   FINAL CLINICAL IMPRESSION(S) / ED  DIAGNOSES  Sinus tachycardia. Opiate withdrawal.    NEW MEDICATIONS STARTED DURING THIS VISIT:  New Prescriptions   No medications on file     Note:  This document was prepared using Dragon voice recognition software and may include unintentional dictation errors.    Orbie Pyo, MD 02/09/16 2154

## 2016-02-09 NOTE — ED Notes (Signed)
NAD noted at thsi time, no change in patient condition. Pt resting in bed with eyes closed at this time. Will continue to monitor for further patient needs at this time.

## 2016-02-09 NOTE — ED Notes (Signed)
NAD noted at this time. No change in patient condition. Pt continues to rest in bed with eyes closed, no needs noted at this time. Continues to be tachycardic. Will continue to monitor for further patient.

## 2016-02-09 NOTE — ED Notes (Signed)
Pt appears in less distress than earlier, sweating has stopped. Pt resting in bed, appears in no acute distress. VSS, reamins tachycardic at this time.

## 2016-02-09 NOTE — ED Notes (Signed)
MD notified of patient beginning to sweat. Obvious beads of sweat noted to patient's forehead and running down his chest. Pt states he feels better, pt appears less distress than upon his arrival to the room.

## 2016-02-10 DIAGNOSIS — J449 Chronic obstructive pulmonary disease, unspecified: Secondary | ICD-10-CM | POA: Diagnosis not present

## 2016-02-10 DIAGNOSIS — Z716 Tobacco abuse counseling: Secondary | ICD-10-CM | POA: Diagnosis not present

## 2016-02-10 DIAGNOSIS — D729 Disorder of white blood cells, unspecified: Secondary | ICD-10-CM | POA: Diagnosis not present

## 2016-02-10 DIAGNOSIS — R000 Tachycardia, unspecified: Secondary | ICD-10-CM | POA: Diagnosis not present

## 2016-02-10 DIAGNOSIS — G8929 Other chronic pain: Secondary | ICD-10-CM | POA: Diagnosis not present

## 2016-02-10 DIAGNOSIS — F1123 Opioid dependence with withdrawal: Secondary | ICD-10-CM | POA: Diagnosis not present

## 2016-02-10 LAB — BASIC METABOLIC PANEL
Anion gap: 8 (ref 5–15)
BUN: 17 mg/dL (ref 6–20)
CALCIUM: 7.9 mg/dL — AB (ref 8.9–10.3)
CO2: 21 mmol/L — ABNORMAL LOW (ref 22–32)
CREATININE: 1.16 mg/dL (ref 0.61–1.24)
Chloride: 106 mmol/L (ref 101–111)
GFR calc Af Amer: >60 mL/min (ref >60)
GLUCOSE: 128 mg/dL — AB (ref 65–99)
Potassium: 3.9 mmol/L (ref 3.5–5.1)
SODIUM: 135 mmol/L (ref 135–145)

## 2016-02-10 LAB — URINE DRUG SCREEN, QUALITATIVE (ARMC ONLY)
Amphetamines, Ur Screen: NOT DETECTED
BARBITURATES, UR SCREEN: NOT DETECTED
BENZODIAZEPINE, UR SCRN: POSITIVE — AB
CANNABINOID 50 NG, UR ~~LOC~~: NOT DETECTED
Cocaine Metabolite,Ur ~~LOC~~: NOT DETECTED
MDMA (Ecstasy)Ur Screen: NOT DETECTED
Methadone Scn, Ur: NOT DETECTED
OPIATE, UR SCREEN: POSITIVE — AB
PHENCYCLIDINE (PCP) UR S: NOT DETECTED
Tricyclic, Ur Screen: POSITIVE — AB

## 2016-02-10 LAB — CBC
HCT: 42.1 % (ref 40.0–52.0)
Hemoglobin: 14.5 g/dL (ref 13.0–18.0)
MCH: 33.1 pg (ref 26.0–34.0)
MCHC: 34.4 g/dL (ref 32.0–36.0)
MCV: 96.5 fL (ref 80.0–100.0)
PLATELETS: 150 10*3/uL (ref 150–440)
RBC: 4.36 MIL/uL — ABNORMAL LOW (ref 4.40–5.90)
RDW: 15.5 % — AB (ref 11.5–14.5)
WBC: 15.2 10*3/uL — AB (ref 3.8–10.6)

## 2016-02-10 LAB — TROPONIN I
TROPONIN I: 0.03 ng/mL — AB (ref <0.03)
TROPONIN I: 0.04 ng/mL — AB (ref <0.03)

## 2016-02-10 MED ORDER — OXYCODONE HCL 10 MG PO TABS: 10.0000 mg | ORAL_TABLET | Freq: Three times a day (TID) | ORAL | Status: DC | PRN

## 2016-02-10 MED ORDER — CETYLPYRIDINIUM CHLORIDE 0.05 % MT LIQD
7.0000 mL | Freq: Two times a day (BID) | OROMUCOSAL | Status: DC
  Administered 2016-02-10: 08:00:00 7 mL via OROMUCOSAL

## 2016-02-10 NOTE — Progress Notes (Signed)
   02/10/16 1000  Clinical Encounter Type  Visited With Patient  Visit Type Initial  Referral From Nurse  Consult/Referral To Chaplain  Spiritual Encounters  Spiritual Needs Literature  Stress Factors  Patient Stress Factors Exhausted;Health changes  Advance Directives (For Healthcare)  Does patient have an advance directive? No  Would patient like information on creating an advanced directive? Yes - Educational materials given  Patient was sleeping on my arrival. Provided AD documents w/note to have Chaplain paged if there are any questions unanswered by the literature.  Chap. Yvette Roark G. [place]

## 2016-02-10 NOTE — Progress Notes (Signed)
Discharge paperwork reviewed with patient who verbalized understanding.  Prescription for oxycodone given to patient.  Explained patient will have to follow up with his PCP for further assistance with pain medication prescriptions. Patient acknowledged.   Patient to be transported home by mom.

## 2016-02-10 NOTE — Discharge Instructions (Signed)
Heart healthy diet. Activity as tolerated. Smoking cessation.

## 2016-02-10 NOTE — Discharge Summary (Signed)
[facility] NAME: Stanley Rios    MR#:  [id number]  DATE OF BIRTH:  [birth year]  DATE OF ADMISSION:  02/09/2016 ADMITTING PHYSICIAN: Lance Coon, MD  DATE OF DISCHARGE: 02/10/2016 PRIMARY CARE PHYSICIAN: Marygrace Drought, MD    ADMISSION DIAGNOSIS:  Sinus tachycardia ([place]) [R00.0] Opiate withdrawal ([place]) [F11.23]  DISCHARGE DIAGNOSIS:  Principal Problem:   Opiate withdrawal (HCC) Active Problems:   COPD (chronic obstructive pulmonary disease) (HCC)   HTN (hypertension)   Depression   Anxiety   Chronic pain   Neutrophilic leukocytosis abnormal LFT.  SECONDARY DIAGNOSIS:   Past Medical History  Diagnosis Date  . COPD (chronic obstructive pulmonary disease) ([place])   . Chronic back pain   . Chronic neck pain   . Hypertension   . Depression   . Anxiety   . Opiate abuse, continuous     HOSPITAL COURSE:   Opiate withdrawal. On oxycodone prn. Tachycardia improved. He complained of chronic back pain but no other complaint. He run out of pain medication since he was fired by his PCP. He contacted pain clinic to make an appointment but it will be one week later. He wants to go home with pain medication.  I will give prescription of oxycodone prn to minimize risk of opiate withdrawal before his appointment.   Chronic pain - treated with oxycodone when necessary.   Neutrophilic leukocytosis - Unclear source at this point. Chest was clear of any signs of infection. Urine is clear. Possible due to reaction. F/u CBC as outpatient.  Abnormal LFT. F/u CMP and PCP as outpatient.   COPD (chronic obstructive pulmonary disease) ([place]) - continue home inhalers  HTN (hypertension) - continue home meds  Anxiety - symptomatic treatment of opioid withdrawal above includes benzos.  Depression - continue home meds Tobacco abuse. Smoking cessation was counseled for 3 min. DISCHARGE CONDITIONS:   Stable, discharge to home  today.  CONSULTS OBTAINED:     DRUG ALLERGIES:  No Known Allergies  DISCHARGE MEDICATIONS:   Current Discharge Medication List    CONTINUE these medications which have CHANGED   Details  Oxycodone HCl 10 MG TABS Take 1 tablet (10 mg total) by mouth every 8 (eight) hours as needed (for pain). Qty: 15 tablet, Refills: 0      CONTINUE these medications which have NOT CHANGED   Details  albuterol (PROVENTIL HFA;VENTOLIN HFA) 108 (90 Base) MCG/ACT inhaler Inhale 2 puffs into the lungs every 4 (four) hours as needed for wheezing or shortness of breath. Reported on 10/26/2015    baclofen (LIORESAL) 10 MG tablet Take 10 mg by mouth 2 (two) times daily as needed for muscle spasms.     buPROPion (WELLBUTRIN) 75 MG tablet Take 75 mg by mouth 2 (two) times daily. Reported on 10/26/2015    celecoxib (CELEBREX) 200 MG capsule Take 200 mg by mouth 2 (two) times daily with a meal.    diclofenac sodium (VOLTAREN) 1 % GEL Apply 4 g topically 4 (four) times daily as needed (for pain).     DULoxetine (CYMBALTA) 60 MG capsule Take 60 mg by mouth daily. Reported on 10/26/2015    enalapril (VASOTEC) 5 MG tablet Take 5 mg by mouth daily.    finasteride (PROSCAR) 5 MG tablet Take 5 mg by mouth daily.    Fluticasone-Salmeterol (ADVAIR) 250-50 MCG/DOSE AEPB Inhale 1 puff into the lungs 2 (two) times daily. Reported on 10/26/2015    gabapentin (NEURONTIN) 400 MG  capsule Take 800 mg by mouth 3 (three) times daily.    lidocaine (XYLOCAINE) 5 % ointment Apply 1 application topically See admin instructions. 2 to 4 times daily    Morphine-Naltrexone (EMBEDA) 20-0.8 MG CPCR Take 1 capsule by mouth 2 (two) times daily. Reported on 10/26/2015    naloxone [facility]) 2 MG/2ML injection Inject 2 mLs into the muscle as needed. For side effect/adverse reaction/opioid overdose    omeprazole (PRILOSEC) 40 MG capsule Take 40 mg by mouth 2 (two) times daily.    predniSONE (DELTASONE) 10 MG tablet Take 10 mg by mouth  daily.    simvastatin (ZOCOR) 20 MG tablet Take 20 mg by mouth at bedtime.     tamsulosin (FLOMAX) 0.4 MG CAPS capsule Take 0.4 mg by mouth daily.    tiotropium (SPIRIVA) 18 MCG inhalation capsule Place 18 mcg into inhaler and inhale daily. Reported on 10/26/2015    traMADol (ULTRAM) 50 MG tablet Take 50 mg by mouth daily as needed for moderate pain.     traZODone (DESYREL) 50 MG tablet Take 150 mg by mouth at bedtime.    lactobacillus acidophilus & bulgar (LACTINEX) chewable tablet Chew 1 tablet by mouth 3 (three) times daily with meals. Qty: 60 tablet, Refills: 0      STOP taking these medications     amoxicillin-clavulanate (AUGMENTIN) 875-125 MG tablet      clindamycin (CLEOCIN) 300 MG capsule          DISCHARGE INSTRUCTIONS:    DIET:  Heart healthy diet.  DISCHARGE CONDITION:  Stable.  ACTIVITY:  As tolerated.   DISCHARGE LOCATION:    If you experience worsening of your admission symptoms, develop shortness of breath, life threatening emergency, suicidal or homicidal thoughts you must seek medical attention immediately by calling 911 or calling your MD immediately  if symptoms less severe.  You Must read complete instructions/literature along with all the possible adverse reactions/side effects for all the Medicines you take and that have been prescribed to you. Take any new Medicines after you have completely understood and accpet all the possible adverse reactions/side effects.   Please note  You were cared for by a hospitalist during your hospital stay. If you have any questions about your discharge medications or the care you received while you were in the hospital after you are discharged, you can call the unit and asked to speak with the hospitalist on call if the hospitalist that took care of you is not available. Once you are discharged, your primary care physician will handle any further medical issues. Please note that NO REFILLS for any discharge  medications will be authorized once you are discharged, as it is imperative that you return to your primary care physician (or establish a relationship with a primary care physician if you do not have one) for your aftercare needs so that they can reassess your need for medications and monitor your lab values.    On the day of Discharge:  VITAL SIGNS:  Blood pressure 112/68, pulse 104, temperature 98 F (36.7 C), temperature source Oral, resp. rate 20, height '5\' 11"'$  (1.803 m), weight 172 lb (78.019 kg), SpO2 97 %.  PHYSICAL EXAMINATION:  GENERAL:  60 y.o.-year-old patient lying in the bed with no acute distress.  EYES: Pupils equal, round, reactive to light and accommodation. No scleral icterus. Extraocular muscles intact.  HEENT: Head atraumatic, normocephalic. Oropharynx and nasopharynx clear.  NECK:  Supple, no jugular venous distention. No thyroid enlargement, no tenderness.  LUNGS:  Normal breath sounds bilaterally, no wheezing, rales,rhonchi or crepitation. No use of accessory muscles of respiration.  CARDIOVASCULAR: S1, S2 normal. No murmurs, rubs, or gallops.  ABDOMEN: Soft, non-tender, non-distended. Bowel sounds present. No organomegaly or mass.  EXTREMITIES: No pedal edema, cyanosis, or clubbing.  NEUROLOGIC: Cranial nerves II through XII are intact. Muscle strength 5/5 in all extremities. Sensation intact. Gait not checked.  PSYCHIATRIC: The patient is alert and oriented x 3.  SKIN: No obvious rash, lesion, or ulcer.  DATA REVIEW:   CBC  Recent Labs Lab 02/10/16 0227  WBC 15.2*  HGB 14.5  HCT 42.1  PLT 150    Chemistries   Recent Labs Lab 02/09/16 1914 02/10/16 0227  NA 137 135  K 3.8 3.9  CL 105 106  CO2 24 21*  GLUCOSE 84 128*  BUN 16 17  CREATININE 0.96 1.16  CALCIUM 8.5* 7.9*  AST 87*  --   ALT 127*  --   ALKPHOS 185*  --   BILITOT 1.9*  --     Cardiac Enzymes  Recent Labs Lab 02/10/16 0716  TROPONINI 0.04*    Microbiology Results  Results  for orders placed or performed during the hospital encounter of 10/26/15  Culture, blood (routine x 2)     Status: None   Collection Time: 10/26/15  3:42 AM  Result Value Ref Range Status   Specimen Description BLOOD RIGHT ANTECUBITAL  Final   Special Requests BOTTLES DRAWN AEROBIC AND ANAEROBIC 5ML  Final   Culture NO GROWTH 5 DAYS  Final   Report Status 10/31/2015 FINAL  Final  Culture, blood (routine x 2)     Status: None   Collection Time: 10/26/15  3:42 AM  Result Value Ref Range Status   Specimen Description BLOOD LEFT ANTECUBITAL  Final   Special Requests BOTTLES DRAWN AEROBIC AND ANAEROBIC 5ML  Final   Culture NO GROWTH 5 DAYS  Final   Report Status 10/31/2015 FINAL  Final  Culture, blood (routine x 2)     Status: None   Collection Time: 10/26/15  9:04 AM  Result Value Ref Range Status   Specimen Description BLOOD RIGHT HAND  Final   Special Requests   Final    BOTTLES DRAWN AEROBIC AND ANAEROBIC  AER 1CC ANA 8CC   Culture NO GROWTH 5 DAYS  Final   Report Status 10/31/2015 FINAL  Final  Culture, blood (routine x 2)     Status: None   Collection Time: 10/26/15  9:11 AM  Result Value Ref Range Status   Specimen Description BLOOD LEFT HAND  Final   Special Requests BOTTLES DRAWN AEROBIC AND ANAEROBIC  [place]  Final   Culture NO GROWTH 5 DAYS  Final   Report Status 10/31/2015 FINAL  Final    RADIOLOGY:  Dg Chest 1 View  02/09/2016  CLINICAL DATA:  60 year old male with rapid heart rate EXAM: CHEST 1 VIEW COMPARISON:  Chest CT dated 01/16/2016 FINDINGS: Single portable view of the chest demonstrates clear lungs. The costophrenic angles have been excluded from the image. A somewhat rounded density in the right cardiophrenic angle corresponds to the herniated abdominal fat. Is no pneumothorax. The cardiac silhouette is within normal limits. Lower cervical fixation plate and screws. No acute osseous pathology. IMPRESSION: No active disease. Electronically Signed   By: Anner Crete  M.D.   On: 02/09/2016 19:26   Ct Angio Chest Pe W/cm &/or Wo Cm  02/09/2016  CLINICAL DATA:  60 year old male with sinus tachycardia,  abnormal troponin. Recent opioid detox. Initial encounter. EXAM: CT ANGIOGRAPHY CHEST WITH CONTRAST TECHNIQUE: Multidetector CT imaging of the chest was performed using the standard protocol during bolus administration of intravenous contrast. Multiplanar CT image reconstructions and MIPs were obtained to evaluate the vascular anatomy. CONTRAST:  75 mL Isovue 370 COMPARISON:  Chest CTA 01/16/2016 and earlier. FINDINGS: Good contrast bolus timing in the pulmonary arterial tree. No focal filling defect identified in the pulmonary arteries to suggest the acute pulmonary embolism. Abnormal lung parenchyma is suggestive of a combination of upper lobe centrilobular emphysema with occasional bullous or cystic lesions (right upper lobe series 6, image 55), stable from recent study. Mild bilateral peribronchial thickening. Mild perihilar bronchiectasis. Fat containing right Bochdalek's hernia again noted with adjacent medial basal segment right lower lobe atelectasis. No acute pulmonary opacity. No pericardial effusion. There is calcified coronary artery atherosclerosis. There is soft and calcified aortic atherosclerosis. No thoracic lymphadenopathy. No thoracic aortic dissection or aneurysm. Stable visualized liver, spleen, pancreas, adrenal glands, kidneys, and bowel in the upper abdomen. Lower cervical ACDF. Osteopenia. No acute osseous abnormality identified. Review of the MIP images confirms the above findings. IMPRESSION: 1.  No evidence of acute pulmonary embolus. 2. Calcified Coronary artery and aortic atherosclerosis. No thoracic aortic dissection or aneurysm. 3. Stable pulmonary findings including emphysema, mild perihilar bronchiectasis and peribronchial thickening. Electronically Signed   By: Genevie Ann M.D.   On: 02/09/2016 20:52     Management plans discussed with the  patient, family and they are in agreement.  CODE STATUS:     Code Status Orders        Start     Ordered   02/09/16 2348  Full code   Continuous     02/09/16 2347    Code Status History    Date Active Date Inactive Code Status Order ID Comments User Context   10/26/2015  6:09 AM 10/27/2015  6:19 PM Full Code [id number]  Saundra Shelling, MD ED      TOTAL TIME TAKING CARE OF THIS PATIENT: 36 minutes.    Demetrios Loll M.D on 02/10/2016 at 11:44 AM  Between 7am to 6pm - Pager - [phone]  After 6pm go to [email or web address] - password [email or web address] [place] [facility]  Office  [phone]  CC: Primary care physician; Marygrace Drought, MD   Note: This dictation was prepared with Dragon dictation along with smaller phrase technology. Any transcriptional errors that result from this process are unintentional.

## 2016-02-14 DIAGNOSIS — M5441 Lumbago with sciatica, right side: Secondary | ICD-10-CM | POA: Diagnosis not present

## 2016-02-14 DIAGNOSIS — I10 Essential (primary) hypertension: Secondary | ICD-10-CM | POA: Diagnosis not present

## 2016-02-14 DIAGNOSIS — T40601S Poisoning by unspecified narcotics, accidental (unintentional), sequela: Secondary | ICD-10-CM | POA: Diagnosis not present

## 2016-02-14 DIAGNOSIS — J449 Chronic obstructive pulmonary disease, unspecified: Secondary | ICD-10-CM | POA: Diagnosis not present

## 2016-02-14 DIAGNOSIS — M961 Postlaminectomy syndrome, not elsewhere classified: Secondary | ICD-10-CM | POA: Diagnosis not present

## 2016-02-14 DIAGNOSIS — G894 Chronic pain syndrome: Secondary | ICD-10-CM | POA: Diagnosis not present

## 2016-02-14 DIAGNOSIS — G8929 Other chronic pain: Secondary | ICD-10-CM | POA: Diagnosis not present

## 2016-02-14 DIAGNOSIS — K219 Gastro-esophageal reflux disease without esophagitis: Secondary | ICD-10-CM | POA: Diagnosis not present

## 2016-02-14 DIAGNOSIS — M4802 Spinal stenosis, cervical region: Secondary | ICD-10-CM | POA: Diagnosis not present

## 2016-02-14 LAB — CULTURE, BLOOD (ROUTINE X 2)
CULTURE: NO GROWTH
Culture: NO GROWTH

## 2016-03-23 ENCOUNTER — Emergency Department
Admission: EM | Admit: 2016-03-23 | Discharge: 2016-03-24 | Disposition: A | Discharge status: Home / Self Care | Payer: PPO | Attending: Emergency Medicine | Admitting: Emergency Medicine

## 2016-03-23 ENCOUNTER — Encounter: Payer: Self-pay | Admitting: Emergency Medicine

## 2016-03-23 ENCOUNTER — Emergency Department: Payer: PPO

## 2016-03-23 DIAGNOSIS — N390 Urinary tract infection, site not specified: Secondary | ICD-10-CM | POA: Diagnosis not present

## 2016-03-23 DIAGNOSIS — R4020 Unspecified coma: Secondary | ICD-10-CM | POA: Diagnosis not present

## 2016-03-23 DIAGNOSIS — J449 Chronic obstructive pulmonary disease, unspecified: Secondary | ICD-10-CM | POA: Diagnosis not present

## 2016-03-23 DIAGNOSIS — R4182 Altered mental status, unspecified: Secondary | ICD-10-CM | POA: Diagnosis not present

## 2016-03-23 DIAGNOSIS — I9589 Other hypotension: Secondary | ICD-10-CM | POA: Diagnosis not present

## 2016-03-23 DIAGNOSIS — Z79899 Other long term (current) drug therapy: Secondary | ICD-10-CM | POA: Diagnosis not present

## 2016-03-23 DIAGNOSIS — F17200 Nicotine dependence, unspecified, uncomplicated: Secondary | ICD-10-CM | POA: Diagnosis not present

## 2016-03-23 DIAGNOSIS — F1119 Opioid abuse with unspecified opioid-induced disorder: Secondary | ICD-10-CM | POA: Diagnosis not present

## 2016-03-23 DIAGNOSIS — I959 Hypotension, unspecified: Secondary | ICD-10-CM | POA: Diagnosis not present

## 2016-03-23 DIAGNOSIS — I10 Essential (primary) hypertension: Secondary | ICD-10-CM | POA: Diagnosis not present

## 2016-03-23 DIAGNOSIS — E860 Dehydration: Secondary | ICD-10-CM

## 2016-03-23 LAB — CBC WITH DIFFERENTIAL/PLATELET
Basophils Absolute: 0.1 10*3/uL (ref 0–0.1)
Basophils Relative: 1 %
Eosinophils Absolute: 0.1 10*3/uL (ref 0–0.7)
Eosinophils Relative: 1 %
HEMATOCRIT: 42 % (ref 40.0–52.0)
HEMOGLOBIN: 14.2 g/dL (ref 13.0–18.0)
LYMPHS ABS: 2.6 10*3/uL (ref 1.0–3.6)
Lymphocytes Relative: 18 %
MCH: 33.3 pg (ref 26.0–34.0)
MCHC: 33.7 g/dL (ref 32.0–36.0)
MCV: 98.6 fL (ref 80.0–100.0)
MONOS PCT: 8 %
Monocytes Absolute: 1.1 10*3/uL — ABNORMAL HIGH (ref 0.2–1.0)
NEUTROS ABS: 10.4 10*3/uL — AB (ref 1.4–6.5)
NEUTROS PCT: 72 %
Platelets: 318 10*3/uL (ref 150–440)
RBC: 4.26 MIL/uL — ABNORMAL LOW (ref 4.40–5.90)
RDW: 15.8 % — ABNORMAL HIGH (ref 11.5–14.5)
WBC: 14.4 10*3/uL — ABNORMAL HIGH (ref 3.8–10.6)

## 2016-03-23 LAB — LACTIC ACID, PLASMA
LACTIC ACID, VENOUS: 2.3 mmol/L — AB (ref 0.5–1.9)
LACTIC ACID, VENOUS: 1.5 mmol/L (ref 0.5–1.9)

## 2016-03-23 LAB — COMPREHENSIVE METABOLIC PANEL
ALK PHOS: 54 U/L (ref 38–126)
ALT: 16 U/L — ABNORMAL LOW (ref 17–63)
ANION GAP: 7 (ref 5–15)
AST: 20 U/L (ref 15–41)
Albumin: 3.6 g/dL (ref 3.5–5.0)
BILIRUBIN TOTAL: 0.5 mg/dL (ref 0.3–1.2)
BUN: 16 mg/dL (ref 6–20)
CALCIUM: 9.2 mg/dL (ref 8.9–10.3)
CO2: 25 mmol/L (ref 22–32)
Chloride: 108 mmol/L (ref 101–111)
Creatinine, Ser: 1.18 mg/dL (ref 0.61–1.24)
GLUCOSE: 95 mg/dL (ref 65–99)
Potassium: 3.6 mmol/L (ref 3.5–5.1)
Sodium: 140 mmol/L (ref 135–145)
TOTAL PROTEIN: 6.1 g/dL — AB (ref 6.5–8.1)

## 2016-03-23 LAB — URINALYSIS COMPLETE WITH MICROSCOPIC (ARMC ONLY)
BACTERIA UA: NONE SEEN
Bilirubin Urine: NEGATIVE
GLUCOSE, UA: NEGATIVE mg/dL
Hgb urine dipstick: NEGATIVE
Leukocytes, UA: NEGATIVE
NITRITE: NEGATIVE
Protein, ur: 100 mg/dL — AB
Specific Gravity, Urine: 1.024 (ref 1.005–1.030)
pH: 5 (ref 5.0–8.0)

## 2016-03-23 LAB — TROPONIN I: Troponin I: <0.03 ng/mL (ref <0.03)

## 2016-03-23 MED ORDER — SODIUM CHLORIDE 0.9 % IV SOLN
1000.0000 mL | Freq: Once | INTRAVENOUS | Status: AC
  Administered 2016-03-23: 1000 mL via INTRAVENOUS

## 2016-03-23 MED ORDER — SODIUM CHLORIDE 0.9 % IV SOLN
Freq: Once | INTRAVENOUS | Status: AC
  Administered 2016-03-23: 1000 mL via INTRAVENOUS

## 2016-03-23 MED ORDER — SODIUM CHLORIDE 0.9 % IV BOLUS (SEPSIS): 500.0000 mL | Freq: Once | INTRAVENOUS | Status: DC

## 2016-03-23 MED ORDER — NALOXONE HCL 2 MG/2ML IJ SOSY
0.4000 mg | PREFILLED_SYRINGE | Freq: Once | INTRAMUSCULAR | Status: AC
  Administered 2016-03-23: 0.4 mg via INTRAVENOUS
  Filled 2016-03-23: qty 2

## 2016-03-23 NOTE — ED Provider Notes (Signed)
[facility] Emergency Department Provider Note        Time seen: ----------------------------------------- 8:06 PM on 03/23/2016 -----------------------------------------    I have reviewed the triage vital signs and the nursing notes.   HISTORY  Chief Complaint Altered Mental Status    HPI Stanley Rios. is a 60 y.o. male who presents to ER being brought by ambulance for altered mental status. Mother states he has not taken his pain medicines or anti- anxiety medicines today. Reportedly has history of chronic back pain, chronic neck pain, depression, anxiety and opiate abuse. Patient denies any recent illnessor changes in his medications. He denies any complaints of pain.   Past Medical History:  Diagnosis Date  . Anxiety   . Chronic back pain   . Chronic neck pain   . COPD (chronic obstructive pulmonary disease) ([place])   . Depression   . Hypertension   . Opiate abuse, continuous     Patient Active Problem List   Diagnosis Date Noted  . Opiate withdrawal ([place]) 02/09/2016  . COPD (chronic obstructive pulmonary disease) ([place]) 02/09/2016  . HTN (hypertension) 02/09/2016  . Depression 02/09/2016  . Anxiety 02/09/2016  . Chronic pain 02/09/2016  . Neutrophilic leukocytosis 19/37/9024  . Cellulitis 10/26/2015    Past Surgical History:  Procedure Laterality Date  . CHOLECYSTECTOMY    . SPINAL FUSION     ?? C3- C4    Allergies Review of patient's allergies indicates no known allergies.  Social History Social History  Substance Use Topics  . Smoking status: Current Every Day Smoker  . Smokeless tobacco: Not on file  . Alcohol use No    Review of Systems Constitutional: Negative for fever. Cardiovascular: Negative for chest pain. Respiratory: Negative for shortness of breath. Gastrointestinal: Negative for abdominal pain, vomiting and diarrhea. Genitourinary: Negative for dysuria. Musculoskeletal: Negative for back  pain. Skin: Negative for rash. Neurological: Negative for headaches, Positive for weakness  10-point ROS otherwise negative.  ____________________________________________   PHYSICAL EXAM:  VITAL SIGNS: ED Triage Vitals [03/23/16 2003]  Enc Vitals Group     BP      Pulse      Resp      Temp      Temp src      SpO2 95 %     Weight      Height      Head Circumference      Peak Flow      Pain Score      Pain Loc      Pain Edu?      Excl. in [place]?     Constitutional: Alert, Drowsy, no distress. Eyes: Conjunctivae are normal. PERRL. Normal extraocular movements. ENT   Head: Normocephalic and atraumatic.   Nose: No congestion/rhinnorhea.   Mouth/Throat: Mucous membranes are moist.   Neck: No stridor. Cardiovascular: Normal rate, regular rhythm. No murmurs, rubs, or gallops. Respiratory: Normal respiratory effort without tachypnea nor retractions. Breath sounds are clear and equal bilaterally. No wheezes/rales/rhonchi. Gastrointestinal: Soft and nontender. Normal bowel sounds Musculoskeletal: Nontender with normal range of motion in all extremities. No lower extremity tenderness nor edema. Neurologic:  Normal speech and language. No gross focal neurologic deficits are appreciated.  Skin:  Skin is warm, dry and intact. No rash noted. ____________________________________________  EKG: Interpreted by me. Sinus tachycardia with rate of 103 bpm, normal PR interval, wide QRS, normal QT interval. Right bundle branch block.  ____________________________________________  ED COURSE:  Pertinent labs & imaging results  that were available during my care of the patient were reviewed by me and considered in my medical decision making (see chart for details). Clinical Course  Comment By Time  Patient is normotensive and improving after a liter saline, we will give him a second bolus of fluids. Earleen Newport, MD 08/27 2101  Patient presents with altered mental status of  unclear etiology. He will receive IV fluids, IV Narcan.  Procedures ____________________________________________   LABS (pertinent positives/negatives)  Labs Reviewed  CBC WITH DIFFERENTIAL/PLATELET - Abnormal; Notable for the following:       Result Value   WBC 14.4 (*)    RBC 4.26 (*)    RDW 15.8 (*)    Neutro Abs 10.4 (*)    Monocytes Absolute 1.1 (*)    All other components within normal limits  COMPREHENSIVE METABOLIC PANEL - Abnormal; Notable for the following:    Total Protein 6.1 (*)    ALT 16 (*)    All other components within normal limits  URINALYSIS COMPLETEWITH MICROSCOPIC ([facility] ONLY) - Abnormal; Notable for the following:    Color, Urine AMBER (*)    APPearance CLEAR (*)    Ketones, ur TRACE (*)    Protein, ur 100 (*)    Squamous Epithelial / LPF 0-5 (*)    All other components within normal limits  LACTIC ACID, PLASMA - Abnormal; Notable for the following:    Lactic Acid, Venous 2.3 (*)    All other components within normal limits  TROPONIN I  LACTIC ACID, PLASMA  CBG MONITORING, ED    RADIOLOGY Images were viewed by me  CT head Is unremarkable ____________________________________________  FINAL ASSESSMENT AND PLAN  Altered mental status, dehydration, transient hypotension  Plan: Patient with labs and imaging as dictated above. Patient is in no acute distress, he improved with 2 L of normal saline bolus here. Family states he does not eat or drink very much and typically only drinks coffee. His repeat lactic acid was normal, appear to be dehydrated earlier. He is stable for outpatient follow-up with his doctor.   Earleen Newport, MD   Note: This dictation was prepared with Dragon dictation. Any transcriptional errors that result from this process are unintentional    Earleen Newport, MD 03/23/16 2352

## 2016-03-23 NOTE — ED Triage Notes (Signed)
Per EMS patient's family called about patient's altered mental status.  Patient "has not been acting right"  Per his mother he takes Morphine for pain but has not taken any today.  He denies having any health issues despite many diagnoses.  Patient denies any pain.  Patient is sluggish in triage and appears fatigued.  Patient's BP was 120/73 during transport but during triage BP dropped to 76/64 and placed in Trendelenberg

## 2016-03-27 ENCOUNTER — Encounter: Payer: Self-pay | Admitting: Pain Medicine

## 2016-03-27 ENCOUNTER — Ambulatory Visit: Payer: PPO | Attending: Pain Medicine | Admitting: Pain Medicine

## 2016-03-27 DIAGNOSIS — K219 Gastro-esophageal reflux disease without esophagitis: Secondary | ICD-10-CM | POA: Diagnosis not present

## 2016-03-27 DIAGNOSIS — M5136 Other intervertebral disc degeneration, lumbar region: Secondary | ICD-10-CM | POA: Diagnosis not present

## 2016-03-27 DIAGNOSIS — Z9889 Other specified postprocedural states: Secondary | ICD-10-CM | POA: Diagnosis not present

## 2016-03-27 DIAGNOSIS — M47812 Spondylosis without myelopathy or radiculopathy, cervical region: Secondary | ICD-10-CM | POA: Diagnosis not present

## 2016-03-27 DIAGNOSIS — J449 Chronic obstructive pulmonary disease, unspecified: Secondary | ICD-10-CM | POA: Diagnosis not present

## 2016-03-27 DIAGNOSIS — M542 Cervicalgia: Secondary | ICD-10-CM | POA: Diagnosis not present

## 2016-03-27 DIAGNOSIS — F17210 Nicotine dependence, cigarettes, uncomplicated: Secondary | ICD-10-CM | POA: Insufficient documentation

## 2016-03-27 DIAGNOSIS — I10 Essential (primary) hypertension: Secondary | ICD-10-CM | POA: Diagnosis not present

## 2016-03-27 DIAGNOSIS — M5412 Radiculopathy, cervical region: Secondary | ICD-10-CM | POA: Diagnosis not present

## 2016-03-27 DIAGNOSIS — M533 Sacrococcygeal disorders, not elsewhere classified: Secondary | ICD-10-CM | POA: Insufficient documentation

## 2016-03-27 DIAGNOSIS — M47816 Spondylosis without myelopathy or radiculopathy, lumbar region: Secondary | ICD-10-CM | POA: Insufficient documentation

## 2016-03-27 DIAGNOSIS — M546 Pain in thoracic spine: Secondary | ICD-10-CM | POA: Diagnosis not present

## 2016-03-27 DIAGNOSIS — M5030 Other cervical disc degeneration, unspecified cervical region: Secondary | ICD-10-CM | POA: Diagnosis not present

## 2016-03-27 DIAGNOSIS — M545 Low back pain: Secondary | ICD-10-CM | POA: Diagnosis not present

## 2016-03-27 DIAGNOSIS — F418 Other specified anxiety disorders: Secondary | ICD-10-CM | POA: Diagnosis not present

## 2016-03-27 DIAGNOSIS — F410 Panic disorder [episodic paroxysmal anxiety] without agoraphobia: Secondary | ICD-10-CM | POA: Insufficient documentation

## 2016-03-27 DIAGNOSIS — M47817 Spondylosis without myelopathy or radiculopathy, lumbosacral region: Secondary | ICD-10-CM | POA: Diagnosis not present

## 2016-03-27 DIAGNOSIS — M5416 Radiculopathy, lumbar region: Secondary | ICD-10-CM | POA: Diagnosis not present
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
## 2016-03-27 NOTE — Progress Notes (Signed)
Safety precautions to be maintained throughout the outpatient stay will include: orient to surroundings, keep bed in low position, maintain call bell within reach at all times, provide assistance with transfer out of bed and ambulation.  

## 2016-03-27 NOTE — Patient Instructions (Addendum)
    PLAN  Continue present medication  F/U PCP Dr. Guadelupe Sabin for evaluation of  BP and general medical  condition  Patient would prefer to have treatment at this facility since transportation to [facility] would be difficult. Please assist patient with establishing care at this facility

## 2016-04-03 ENCOUNTER — Ambulatory Visit
Admission: EM | Admit: 2016-04-03 | Discharge: 2016-04-03 | Disposition: A | Discharge status: Home / Self Care | Payer: PPO | Attending: Family Medicine | Admitting: Family Medicine

## 2016-04-03 ENCOUNTER — Encounter: Payer: Self-pay | Admitting: Emergency Medicine

## 2016-04-03 DIAGNOSIS — R05 Cough: Secondary | ICD-10-CM

## 2016-04-03 DIAGNOSIS — R059 Cough, unspecified: Secondary | ICD-10-CM

## 2016-04-03 DIAGNOSIS — J441 Chronic obstructive pulmonary disease with (acute) exacerbation: Secondary | ICD-10-CM

## 2016-04-03 MED ORDER — METHYLPREDNISOLONE SODIUM SUCC 125 MG IJ SOLR
125.0000 mg | Freq: Once | INTRAMUSCULAR | Status: AC
  Administered 2016-04-03: 125 mg via INTRAMUSCULAR

## 2016-04-03 MED ORDER — PREDNISONE 10 MG (21) PO TBPK: ORAL_TABLET | ORAL | 0 refills | Status: DC

## 2016-04-03 MED ORDER — AZITHROMYCIN 250 MG PO TABS: ORAL_TABLET | ORAL | 0 refills | Status: DC

## 2016-04-03 MED ORDER — IPRATROPIUM-ALBUTEROL 0.5-2.5 (3) MG/3ML IN SOLN
3.0000 mL | Freq: Once | RESPIRATORY_TRACT | Status: AC
  Administered 2016-04-03: 3 mL via RESPIRATORY_TRACT

## 2016-04-03 NOTE — ED Triage Notes (Signed)
Patient states he has COPD but developed a cough and congestion about 4 days ago

## 2016-04-03 NOTE — ED Provider Notes (Signed)
[facility]    CSN: [id number] Arrival date & time: 04/03/16  1810  First Provider Contact:  None       History   Chief Complaint Chief Complaint  Patient presents with  . Cough    HPI Stanley Rios. is a 60 y.o. male.   Patient's here because of shortness of breath. He smokes his COPD he no smoking COPD is not good for him. He states over the last 4 days had increased shortness of breath coughing yellowish-green sputum. He states that normally he gets a prednisone antibiotic medicine to help. He denies any chest pain states this is usual shortness of breath that he has. He states he has some axis I symptoms accessed O2 at home from a friend and wants to know how much oxygen he should be on a warm not take more than 2 L unless his radial to the hospital to be admitted. As stated above he does smoke he is not allergic to any medication he has history of COPD chronic back pain anxiety hyperlipidemia hypertension and opiate abuse as well.   The history is provided by the patient. No language interpreter was used.  Cough  Cough characteristics:  Productive Sputum characteristics:  Yellow and green Duration:  4 days Timing:  Constant Progression:  Worsening Chronicity:  New Context: upper respiratory infection   Worsened by:  Deep breathing Ineffective treatments:  Steroid inhaler and beta-agonist inhaler Associated symptoms: shortness of breath   Associated symptoms: no chest pain     Past Medical History:  Diagnosis Date  . Anxiety   . Chronic back pain   . Chronic neck pain   . COPD (chronic obstructive pulmonary disease) ([place])   . Depression   . Hyperlipidemia   . Hypertension   . Opiate abuse, continuous     Patient Active Problem List   Diagnosis Date Noted  . DDD (degenerative disc disease), lumbar 03/27/2016  . Facet syndrome, lumbar 03/27/2016  . Sacroiliac joint dysfunction 03/27/2016  . DDD (degenerative disc disease), cervical 03/27/2016   . Cervical facet syndrome 03/27/2016  . Opiate withdrawal ([place]) 02/09/2016  . COPD (chronic obstructive pulmonary disease) ([place]) 02/09/2016  . HTN (hypertension) 02/09/2016  . Depression 02/09/2016  . Anxiety 02/09/2016  . Chronic pain 02/09/2016  . Neutrophilic leukocytosis 42/70/6237  . Cellulitis 10/26/2015    Past Surgical History:  Procedure Laterality Date  . CHOLECYSTECTOMY    . NECK SURGERY  2002 approx   fusion, about 12 years ago  . SPINAL FUSION     ?? C3- C4       Home Medications    Prior to Admission medications   Medication Sig Start Date End Date Taking? Authorizing Provider  albuterol (PROVENTIL HFA;VENTOLIN HFA) 108 (90 Base) MCG/ACT inhaler Inhale 2 puffs into the lungs every 4 (four) hours as needed for wheezing or shortness of breath. Reported on 10/26/2015    Historical Provider, MD  azithromycin (ZITHROMAX Z-PAK) 250 MG tablet Take 2 tablets first day and then 1 po a day for 4 days 04/03/16   Frederich Cha, MD  baclofen (LIORESAL) 10 MG tablet Take 10 mg by mouth 2 (two) times daily as needed for muscle spasms.     Historical Provider, MD  buPROPion (WELLBUTRIN) 75 MG tablet Take 75 mg by mouth 2 (two) times daily. Reported on 10/26/2015    Historical Provider, MD  celecoxib (CELEBREX) 200 MG capsule Take 200 mg by mouth 2 (two) times daily with  a meal.    Historical Provider, MD  diclofenac sodium (VOLTAREN) 1 % GEL Apply 4 g topically 4 (four) times daily as needed (for pain).     Historical Provider, MD  DULoxetine (CYMBALTA) 60 MG capsule Take 60 mg by mouth daily. Reported on 10/26/2015    Historical Provider, MD  enalapril (VASOTEC) 5 MG tablet Take 5 mg by mouth daily.    Historical Provider, MD  finasteride (PROSCAR) 5 MG tablet Take 5 mg by mouth daily.    Historical Provider, MD  Fluticasone-Salmeterol (ADVAIR) 250-50 MCG/DOSE AEPB Inhale 1 puff into the lungs 2 (two) times daily. Reported on 10/26/2015    Historical Provider, MD  gabapentin (NEURONTIN)  400 MG capsule Take 800 mg by mouth 3 (three) times daily.    Historical Provider, MD  lactobacillus acidophilus & bulgar (LACTINEX) chewable tablet Chew 1 tablet by mouth 3 (three) times daily with meals. Patient not taking: Reported on 01/16/2016 10/27/15   Bettey Costa, MD  lidocaine (XYLOCAINE) 5 % ointment Apply 1 application topically See admin instructions. 2 to 4 times daily    Historical Provider, MD  Morphine-Naltrexone (EMBEDA) 20-0.8 MG CPCR Take 1 capsule by mouth 2 (two) times daily. Reported on 10/26/2015    Historical Provider, MD  naloxone [place]) 2 MG/2ML injection Inject 2 mLs into the muscle as needed. For side effect/adverse reaction/opioid overdose    Historical Provider, MD  omeprazole (PRILOSEC) 40 MG capsule Take 40 mg by mouth 2 (two) times daily.    Historical Provider, MD  Oxycodone HCl 10 MG TABS Take 1 tablet (10 mg total) by mouth every 8 (eight) hours as needed (for pain). Patient not taking: Reported on 03/27/2016 02/10/16   Demetrios Loll, MD  predniSONE (DELTASONE) 10 MG tablet Take 10 mg by mouth daily.    Historical Provider, MD  predniSONE (STERAPRED UNI-PAK 21 TAB) 10 MG (21) TBPK tablet Sig 6 tablet day 1, 5 tablets day 2, 4 tablets day 3,,3tablets day 4, 2 tablets day 5, 1 tablet day 6 take all tablets orally 04/03/16   Frederich Cha, MD  simvastatin (ZOCOR) 20 MG tablet Take 20 mg by mouth at bedtime.     Historical Provider, MD  tamsulosin (FLOMAX) 0.4 MG CAPS capsule Take 0.4 mg by mouth daily.    Historical Provider, MD  tiotropium (SPIRIVA) 18 MCG inhalation capsule Place 18 mcg into inhaler and inhale daily. Reported on 10/26/2015    Historical Provider, MD  traMADol (ULTRAM) 50 MG tablet Take 50 mg by mouth daily as needed for moderate pain.     Historical Provider, MD  traZODone (DESYREL) 50 MG tablet Take 150 mg by mouth at bedtime.    Historical Provider, MD    Family History Family History  Problem Relation Age of Onset  . Bladder Cancer Father     deceased     Social History Social History  Substance Use Topics  . Smoking status: Current Every Day Smoker    Types: Cigarettes  . Smokeless tobacco: Never Used  . Alcohol use Not on file     Allergies   Review of patient's allergies indicates no known allergies.   Review of Systems Review of Systems  Constitutional: Negative.   HENT: Positive for congestion.   Respiratory: Positive for cough and shortness of breath. Negative for apnea.   Cardiovascular: Negative for chest pain and leg swelling.  Musculoskeletal: Negative.   Skin: Negative.   All other systems reviewed and are negative.  Physical Exam Triage Vital Signs ED Triage Vitals  Enc Vitals Group     BP 04/03/16 1902 (!) 153/92     Pulse Rate 04/03/16 1902 (!) 125     Resp 04/03/16 1902 20     Temp 04/03/16 1902 97.6 F (36.4 C)     Temp Source 04/03/16 1902 Oral     SpO2 04/03/16 1902 97 %     Weight --      Height --      Head Circumference --      Peak Flow --      Pain Score 04/03/16 1900 6     Pain Loc --      Pain Edu? --      Excl. in [place]? --    No data found.   Updated Vital Signs BP (!) 153/92   Pulse (!) 125   Temp 97.6 F (36.4 C) (Oral)   Resp 20   SpO2 97%   Visual Acuity Right Eye Distance:   Left Eye Distance:   Bilateral Distance:    Right Eye Near:   Left Eye Near:    Bilateral Near:     Physical Exam  Constitutional: He appears distressed.  HENT:  Head: Normocephalic and atraumatic.  Eyes: Pupils are equal, round, and reactive to light.  Neck: Normal range of motion.  Cardiovascular: Normal rate.   Pulmonary/Chest: He is in respiratory distress. He has wheezes.  Musculoskeletal: Normal range of motion.  Neurological: He is alert.  Skin: Skin is warm.  Psychiatric: His mood appears anxious.  Vitals reviewed.    UC Treatments / Results  Labs (all labs ordered are listed, but only abnormal results are displayed) Labs Reviewed - No data to display  EKG  EKG  Interpretation None       Radiology No results found.  Procedures Procedures (including critical care time)  Medications Ordered in UC Medications  ipratropium-albuterol (DUONEB) 0.5-2.5 (3) MG/3ML nebulizer solution 3 mL (3 mLs Nebulization Given 04/03/16 1956)  methylPREDNISolone sodium succinate (SOLU-MEDROL) 125 mg/2 mL injection 125 mg (125 mg Intramuscular Given 04/03/16 1958)     Initial Impression / Assessment and Plan / UC Course  I have reviewed the triage vital signs and the nursing notes.  Pertinent labs & imaging results that were available during my care of the patient were reviewed by me and considered in my medical decision making (see chart for details).  Clinical Course    Patient instructed to follow-up with PCP to the ER if his breathing becomes worse tonight. The patient as long as he continues to smoke does not much more we can do for him in urgent care setting. Final Clinical Impressions(s) / UC Diagnoses   Final diagnoses:  Cough  COPD with acute exacerbation ([place])  He was given DuoNeb treatment and side Medrol 100.5 mg with improvement with his breathing.  New Prescriptions New Prescriptions   AZITHROMYCIN (ZITHROMAX Z-PAK) 250 MG TABLET    Take 2 tablets first day and then 1 po a day for 4 days   PREDNISONE (STERAPRED UNI-PAK 21 TAB) 10 MG (21) TBPK TABLET    Sig 6 tablet day 1, 5 tablets day 2, 4 tablets day 3,,3tablets day 4, 2 tablets day 5, 1 tablet day 6 take all tablets orally     Frederich Cha, MD 04/03/16 2023

## 2016-04-06 ENCOUNTER — Encounter: Payer: Self-pay | Admitting: Emergency Medicine

## 2016-04-06 ENCOUNTER — Inpatient Hospital Stay
Admission: EM | Admit: 2016-04-06 | Discharge: 2016-04-18 | DRG: 190 | Disposition: A | Discharge status: Home Health | Payer: PPO | Attending: Internal Medicine | Admitting: Internal Medicine

## 2016-04-06 ENCOUNTER — Emergency Department: Payer: PPO

## 2016-04-06 DIAGNOSIS — L8990 Pressure ulcer of unspecified site, unspecified stage: Secondary | ICD-10-CM | POA: Insufficient documentation

## 2016-04-06 DIAGNOSIS — J440 Chronic obstructive pulmonary disease with acute lower respiratory infection: Principal | ICD-10-CM | POA: Diagnosis present

## 2016-04-06 DIAGNOSIS — R109 Unspecified abdominal pain: Secondary | ICD-10-CM | POA: Diagnosis not present

## 2016-04-06 DIAGNOSIS — K5903 Drug induced constipation: Secondary | ICD-10-CM | POA: Diagnosis present

## 2016-04-06 DIAGNOSIS — Z79891 Long term (current) use of opiate analgesic: Secondary | ICD-10-CM | POA: Diagnosis not present

## 2016-04-06 DIAGNOSIS — E785 Hyperlipidemia, unspecified: Secondary | ICD-10-CM | POA: Diagnosis not present

## 2016-04-06 DIAGNOSIS — Z23 Encounter for immunization: Secondary | ICD-10-CM

## 2016-04-06 DIAGNOSIS — M549 Dorsalgia, unspecified: Secondary | ICD-10-CM | POA: Diagnosis not present

## 2016-04-06 DIAGNOSIS — Z981 Arthrodesis status: Secondary | ICD-10-CM

## 2016-04-06 DIAGNOSIS — R000 Tachycardia, unspecified: Secondary | ICD-10-CM | POA: Diagnosis present

## 2016-04-06 DIAGNOSIS — F17210 Nicotine dependence, cigarettes, uncomplicated: Secondary | ICD-10-CM | POA: Diagnosis present

## 2016-04-06 DIAGNOSIS — N400 Enlarged prostate without lower urinary tract symptoms: Secondary | ICD-10-CM | POA: Diagnosis present

## 2016-04-06 DIAGNOSIS — K661 Hemoperitoneum: Secondary | ICD-10-CM

## 2016-04-06 DIAGNOSIS — R0902 Hypoxemia: Secondary | ICD-10-CM

## 2016-04-06 DIAGNOSIS — K5900 Constipation, unspecified: Secondary | ICD-10-CM | POA: Diagnosis not present

## 2016-04-06 DIAGNOSIS — F1110 Opioid abuse, uncomplicated: Secondary | ICD-10-CM | POA: Diagnosis not present

## 2016-04-06 DIAGNOSIS — R609 Edema, unspecified: Secondary | ICD-10-CM

## 2016-04-06 DIAGNOSIS — G8929 Other chronic pain: Secondary | ICD-10-CM | POA: Diagnosis present

## 2016-04-06 DIAGNOSIS — J441 Chronic obstructive pulmonary disease with (acute) exacerbation: Secondary | ICD-10-CM | POA: Diagnosis present

## 2016-04-06 DIAGNOSIS — J449 Chronic obstructive pulmonary disease, unspecified: Secondary | ICD-10-CM | POA: Diagnosis not present

## 2016-04-06 DIAGNOSIS — M542 Cervicalgia: Secondary | ICD-10-CM | POA: Diagnosis present

## 2016-04-06 DIAGNOSIS — J189 Pneumonia, unspecified organism: Secondary | ICD-10-CM | POA: Diagnosis present

## 2016-04-06 DIAGNOSIS — I10 Essential (primary) hypertension: Secondary | ICD-10-CM | POA: Diagnosis present

## 2016-04-06 DIAGNOSIS — Z7951 Long term (current) use of inhaled steroids: Secondary | ICD-10-CM

## 2016-04-06 DIAGNOSIS — Z79899 Other long term (current) drug therapy: Secondary | ICD-10-CM

## 2016-04-06 DIAGNOSIS — Z8052 Family history of malignant neoplasm of bladder: Secondary | ICD-10-CM

## 2016-04-06 DIAGNOSIS — F419 Anxiety disorder, unspecified: Secondary | ICD-10-CM | POA: Diagnosis not present

## 2016-04-06 DIAGNOSIS — J9811 Atelectasis: Secondary | ICD-10-CM | POA: Diagnosis present

## 2016-04-06 DIAGNOSIS — Z9049 Acquired absence of other specified parts of digestive tract: Secondary | ICD-10-CM

## 2016-04-06 DIAGNOSIS — T40605A Adverse effect of unspecified narcotics, initial encounter: Secondary | ICD-10-CM | POA: Diagnosis present

## 2016-04-06 DIAGNOSIS — R0602 Shortness of breath: Secondary | ICD-10-CM

## 2016-04-06 DIAGNOSIS — J9601 Acute respiratory failure with hypoxia: Secondary | ICD-10-CM | POA: Diagnosis not present

## 2016-04-06 DIAGNOSIS — J181 Lobar pneumonia, unspecified organism: Secondary | ICD-10-CM | POA: Diagnosis not present

## 2016-04-06 DIAGNOSIS — J690 Pneumonitis due to inhalation of food and vomit: Secondary | ICD-10-CM | POA: Diagnosis not present

## 2016-04-06 DIAGNOSIS — K219 Gastro-esophageal reflux disease without esophagitis: Secondary | ICD-10-CM | POA: Diagnosis present

## 2016-04-06 DIAGNOSIS — R079 Chest pain, unspecified: Secondary | ICD-10-CM | POA: Diagnosis not present

## 2016-04-06 LAB — TROPONIN I: Troponin I: <0.03 ng/mL (ref <0.03)

## 2016-04-06 LAB — URINALYSIS COMPLETE WITH MICROSCOPIC (ARMC ONLY)
BACTERIA UA: NONE SEEN
Bilirubin Urine: NEGATIVE
Glucose, UA: NEGATIVE mg/dL
Hgb urine dipstick: NEGATIVE
Ketones, ur: NEGATIVE mg/dL
Nitrite: NEGATIVE
PH: 6 (ref 5.0–8.0)
PROTEIN: NEGATIVE mg/dL
Specific Gravity, Urine: 1.013 (ref 1.005–1.030)

## 2016-04-06 LAB — CBC
HEMATOCRIT: 46 % (ref 40.0–52.0)
HEMOGLOBIN: 15.2 g/dL (ref 13.0–18.0)
MCH: 32.3 pg (ref 26.0–34.0)
MCHC: 33.2 g/dL (ref 32.0–36.0)
MCV: 97.4 fL (ref 80.0–100.0)
Platelets: 331 10*3/uL (ref 150–440)
RBC: 4.72 MIL/uL (ref 4.40–5.90)
RDW: 15.8 % — ABNORMAL HIGH (ref 11.5–14.5)
WBC: 17.7 10*3/uL — AB (ref 3.8–10.6)

## 2016-04-06 LAB — BASIC METABOLIC PANEL
ANION GAP: 7 (ref 5–15)
BUN: 21 mg/dL — ABNORMAL HIGH (ref 6–20)
CALCIUM: 8.9 mg/dL (ref 8.9–10.3)
CO2: 27 mmol/L (ref 22–32)
Chloride: 104 mmol/L (ref 101–111)
Creatinine, Ser: 0.95 mg/dL (ref 0.61–1.24)
GLUCOSE: 154 mg/dL — AB (ref 65–99)
POTASSIUM: 4.4 mmol/L (ref 3.5–5.1)
SODIUM: 138 mmol/L (ref 135–145)

## 2016-04-06 LAB — PHOSPHORUS: PHOSPHORUS: 3 mg/dL (ref 2.5–4.6)

## 2016-04-06 LAB — MAGNESIUM: MAGNESIUM: 2 mg/dL (ref 1.7–2.4)

## 2016-04-06 LAB — LACTIC ACID, PLASMA: LACTIC ACID, VENOUS: 0.9 mmol/L (ref 0.5–1.9)

## 2016-04-06 MED ORDER — SENNOSIDES-DOCUSATE SODIUM 8.6-50 MG PO TABS
1.0000 | ORAL_TABLET | Freq: Every evening | ORAL | Status: DC | PRN
  Administered 2016-04-06: 1 via ORAL
  Filled 2016-04-06 (×4): qty 1

## 2016-04-06 MED ORDER — SODIUM CHLORIDE 0.9 % IV BOLUS (SEPSIS)
500.0000 mL | Freq: Once | INTRAVENOUS | Status: DC
Start: 2016-04-06 — End: 2016-04-06

## 2016-04-06 MED ORDER — GUAIFENESIN ER 600 MG PO TB12
600.0000 mg | ORAL_TABLET | Freq: Two times a day (BID) | ORAL | Status: DC
  Administered 2016-04-06 – 2016-04-07 (×2): 600 mg via ORAL
  Filled 2016-04-06 (×2): qty 1

## 2016-04-06 MED ORDER — PREDNISONE 10 MG PO TABS: 10.0000 mg | ORAL_TABLET | Freq: Every day | ORAL | Status: DC

## 2016-04-06 MED ORDER — MOMETASONE FURO-FORMOTEROL FUM 200-5 MCG/ACT IN AERO
2.0000 | INHALATION_SPRAY | Freq: Two times a day (BID) | RESPIRATORY_TRACT | Status: DC
  Administered 2016-04-06 – 2016-04-18 (×24): 2 via RESPIRATORY_TRACT
  Filled 2016-04-06: qty 8.8

## 2016-04-06 MED ORDER — ONDANSETRON HCL 4 MG/2ML IJ SOLN: 4.0000 mg | Freq: Four times a day (QID) | INTRAMUSCULAR | Status: DC | PRN

## 2016-04-06 MED ORDER — BACLOFEN 10 MG PO TABS: 10.0000 mg | ORAL_TABLET | Freq: Two times a day (BID) | ORAL | Status: DC | PRN

## 2016-04-06 MED ORDER — TRAZODONE HCL 50 MG PO TABS
150.0000 mg | ORAL_TABLET | Freq: Every day | ORAL | Status: DC
  Administered 2016-04-06 – 2016-04-17 (×12): 150 mg via ORAL
  Filled 2016-04-06 (×10): qty 1
  Filled 2016-04-06: qty 18
  Filled 2016-04-06: qty 1

## 2016-04-06 MED ORDER — TAMSULOSIN HCL 0.4 MG PO CAPS
0.4000 mg | ORAL_CAPSULE | Freq: Every day | ORAL | Status: DC
  Administered 2016-04-06 – 2016-04-18 (×13): 0.4 mg via ORAL
  Filled 2016-04-06 (×14): qty 1

## 2016-04-06 MED ORDER — CELECOXIB 100 MG PO CAPS
200.0000 mg | ORAL_CAPSULE | Freq: Two times a day (BID) | ORAL | Status: DC
  Administered 2016-04-07 – 2016-04-18 (×23): 200 mg via ORAL
  Filled 2016-04-06 (×6): qty 2
  Filled 2016-04-06: qty 1
  Filled 2016-04-06 (×18): qty 2

## 2016-04-06 MED ORDER — GABAPENTIN 400 MG PO CAPS
800.0000 mg | ORAL_CAPSULE | Freq: Three times a day (TID) | ORAL | Status: DC
  Administered 2016-04-06 – 2016-04-18 (×36): 800 mg via ORAL
  Filled 2016-04-06 (×36): qty 2

## 2016-04-06 MED ORDER — TIOTROPIUM BROMIDE MONOHYDRATE 18 MCG IN CAPS
18.0000 ug | ORAL_CAPSULE | Freq: Every day | RESPIRATORY_TRACT | Status: DC
  Administered 2016-04-07 – 2016-04-18 (×11): 18 ug via RESPIRATORY_TRACT
  Filled 2016-04-06 (×3): qty 5

## 2016-04-06 MED ORDER — PANTOPRAZOLE SODIUM 40 MG PO TBEC
40.0000 mg | DELAYED_RELEASE_TABLET | Freq: Every day | ORAL | Status: DC
  Administered 2016-04-07 – 2016-04-18 (×12): 40 mg via ORAL
  Filled 2016-04-06 (×13): qty 1

## 2016-04-06 MED ORDER — BUPROPION HCL 75 MG PO TABS
75.0000 mg | ORAL_TABLET | Freq: Two times a day (BID) | ORAL | Status: DC
  Administered 2016-04-06 – 2016-04-18 (×24): 75 mg via ORAL
  Filled 2016-04-06 (×24): qty 1

## 2016-04-06 MED ORDER — LEVOFLOXACIN IN D5W 750 MG/150ML IV SOLN
750.0000 mg | Freq: Once | INTRAVENOUS | Status: AC
  Administered 2016-04-06: 750 mg via INTRAVENOUS
  Filled 2016-04-06: qty 150

## 2016-04-06 MED ORDER — SODIUM CHLORIDE 0.9 % IV BOLUS (SEPSIS)
1000.0000 mL | Freq: Once | INTRAVENOUS | Status: AC
  Administered 2016-04-06: 1000 mL via INTRAVENOUS

## 2016-04-06 MED ORDER — MORPHINE SULFATE (PF) 4 MG/ML IV SOLN
4.0000 mg | Freq: Once | INTRAVENOUS | Status: AC
Start: 2016-04-06 — End: 2016-04-06
  Administered 2016-04-06: 4 mg via INTRAVENOUS
  Filled 2016-04-06: qty 1

## 2016-04-06 MED ORDER — DULOXETINE HCL 60 MG PO CPEP
60.0000 mg | ORAL_CAPSULE | Freq: Every day | ORAL | Status: DC
  Administered 2016-04-06 – 2016-04-18 (×13): 60 mg via ORAL
  Filled 2016-04-06 (×13): qty 1

## 2016-04-06 MED ORDER — SODIUM CHLORIDE 0.9 % IV BOLUS (SEPSIS)
500.0000 mL | Freq: Once | INTRAVENOUS | Status: AC
  Administered 2016-04-06: 500 mL via INTRAVENOUS

## 2016-04-06 MED ORDER — FINASTERIDE 5 MG PO TABS
5.0000 mg | ORAL_TABLET | Freq: Every day | ORAL | Status: DC
  Administered 2016-04-06 – 2016-04-18 (×13): 5 mg via ORAL
  Filled 2016-04-06 (×13): qty 1

## 2016-04-06 MED ORDER — DEXTROMETHORPHAN POLISTIREX ER 30 MG/5ML PO SUER
30.0000 mg | Freq: Two times a day (BID) | ORAL | Status: DC
  Administered 2016-04-06 – 2016-04-08 (×4): 30 mg via ORAL
  Filled 2016-04-06 (×4): qty 5

## 2016-04-06 MED ORDER — ACETAMINOPHEN 325 MG PO TABS
650.0000 mg | ORAL_TABLET | Freq: Four times a day (QID) | ORAL | Status: DC | PRN
Start: 2016-04-06 — End: 2016-04-18

## 2016-04-06 MED ORDER — SIMVASTATIN 20 MG PO TABS
20.0000 mg | ORAL_TABLET | Freq: Every day | ORAL | Status: DC
  Administered 2016-04-06 – 2016-04-17 (×12): 20 mg via ORAL
  Filled 2016-04-06 (×12): qty 1

## 2016-04-06 MED ORDER — MAGNESIUM CITRATE PO SOLN
1.0000 | Freq: Once | ORAL | Status: DC | PRN
  Filled 2016-04-06: qty 296

## 2016-04-06 MED ORDER — IPRATROPIUM-ALBUTEROL 0.5-2.5 (3) MG/3ML IN SOLN
3.0000 mL | Freq: Four times a day (QID) | RESPIRATORY_TRACT | Status: DC | PRN
  Administered 2016-04-08 (×2): 3 mL via RESPIRATORY_TRACT
  Filled 2016-04-06 (×2): qty 3

## 2016-04-06 MED ORDER — SODIUM CHLORIDE 0.9 % IV SOLN
INTRAVENOUS | Status: DC
  Administered 2016-04-06 – 2016-04-08 (×6): via INTRAVENOUS

## 2016-04-06 MED ORDER — ACETAMINOPHEN 650 MG RE SUPP
650.0000 mg | Freq: Four times a day (QID) | RECTAL | Status: DC | PRN
Start: 2016-04-06 — End: 2016-04-18

## 2016-04-06 MED ORDER — ONDANSETRON HCL 4 MG PO TABS: 4.0000 mg | ORAL_TABLET | Freq: Four times a day (QID) | ORAL | Status: DC | PRN

## 2016-04-06 MED ORDER — MORPHINE-NALTREXONE 20-0.8 MG PO CPCR: 1.0000 | ORAL_CAPSULE | Freq: Two times a day (BID) | ORAL | Status: DC

## 2016-04-06 MED ORDER — ENOXAPARIN SODIUM 40 MG/0.4ML ~~LOC~~ SOLN
40.0000 mg | SUBCUTANEOUS | Status: DC
  Administered 2016-04-06 – 2016-04-15 (×10): 40 mg via SUBCUTANEOUS
  Filled 2016-04-06 (×10): qty 0.4

## 2016-04-06 MED ORDER — METHYLPREDNISOLONE SODIUM SUCC 125 MG IJ SOLR
60.0000 mg | Freq: Four times a day (QID) | INTRAMUSCULAR | Status: DC
Start: 2016-04-06 — End: 2016-04-07
  Administered 2016-04-06 – 2016-04-07 (×3): 60 mg via INTRAVENOUS
  Filled 2016-04-06 (×3): qty 2

## 2016-04-06 MED ORDER — DM-GUAIFENESIN ER 30-600 MG PO TB12: 1.0000 | ORAL_TABLET | Freq: Two times a day (BID) | ORAL | Status: DC

## 2016-04-06 MED ORDER — ENALAPRIL MALEATE 10 MG PO TABS
5.0000 mg | ORAL_TABLET | Freq: Every day | ORAL | Status: DC
  Administered 2016-04-06 – 2016-04-17 (×12): 5 mg via ORAL
  Filled 2016-04-06 (×3): qty 1
  Filled 2016-04-06: qty 2
  Filled 2016-04-06 (×8): qty 1

## 2016-04-06 MED ORDER — LEVOFLOXACIN IN D5W 750 MG/150ML IV SOLN
750.0000 mg | Freq: Every day | INTRAVENOUS | Status: DC
  Administered 2016-04-07 – 2016-04-09 (×3): 750 mg via INTRAVENOUS
  Filled 2016-04-06 (×4): qty 150

## 2016-04-06 MED ORDER — TRAMADOL HCL 50 MG PO TABS
50.0000 mg | ORAL_TABLET | Freq: Every day | ORAL | Status: DC | PRN
  Administered 2016-04-06: 50 mg via ORAL
  Filled 2016-04-06: qty 1

## 2016-04-06 MED ORDER — BISACODYL 5 MG PO TBEC
5.0000 mg | DELAYED_RELEASE_TABLET | Freq: Every day | ORAL | Status: DC | PRN
  Administered 2016-04-07 – 2016-04-10 (×2): 5 mg via ORAL
  Filled 2016-04-06 (×2): qty 1

## 2016-04-06 NOTE — H&P (Addendum)
[place] @ [facility] Admission History and Physical Harvie Bridge, D.O.  ---------------------------------------------------------------------------------------------------------------------   PATIENT NAME: Stanley Rios MR#: [id number] DATE OF BIRTH: 1955/11/02 DATE OF ADMISSION: 04/06/2016 PRIMARY CARE PHYSICIAN: Marygrace Drought, MD  REQUESTING/REFERRING PHYSICIAN: ED Dr. Burlene Arnt  CHIEF COMPLAINT: Chief Complaint  Patient presents with  . Flank Pain    HISTORY OF PRESENT ILLNESS: Stanley Rios is a 60 y.o. male with a known history of COPD, hypertension, hyperlipidemia, chronic pain and opiate use was in a usual state of health until several days ago when he developed cough, fevers and chills. He was diagnosed with pneumonia and started on Zithromax and prednisone. He states that his symptoms are progressively worsening and now include chest pain in the left chest and left upper abdomen radiating to the groin which is worse when he coughs or changes position.  Otherwise there has been no change in status. Patient has been taking medication as prescribed and there has been no recent change in medication or diet.  There has been no recent illness, travel or sick contacts.    Patient denies fevers/chills, weakness, dizziness, N/V/C/D, abdominal pain, dysuria/frequency, changes in mental status.   EMS/ED COURSE:   Patient received Levaquin and IV fluids.  PAST MEDICAL HISTORY: Past Medical History:  Diagnosis Date  . Anxiety   . Chronic back pain   . Chronic neck pain   . COPD (chronic obstructive pulmonary disease) ([place])   . Depression   . Hyperlipidemia   . Hypertension   . Opiate abuse, continuous       PAST SURGICAL HISTORY: Past Surgical History:  Procedure Laterality Date  . CHOLECYSTECTOMY    . NECK SURGERY  2002 approx   fusion, about 12 years ago  . SPINAL FUSION     ?? C3- C4      SOCIAL HISTORY: Social History  Substance Use Topics   . Smoking status: Current Every Day Smoker    Packs/day: 0.50    Types: Cigarettes  . Smokeless tobacco: Never Used  . Alcohol use No      FAMILY HISTORY: Family History  Problem Relation Age of Onset  . Bladder Cancer Father     deceased     MEDICATIONS AT HOME: Prior to Admission medications   Medication Sig Start Date End Date Taking? Authorizing Provider  albuterol (PROVENTIL HFA;VENTOLIN HFA) 108 (90 Base) MCG/ACT inhaler Inhale 2 puffs into the lungs every 4 (four) hours as needed for wheezing or shortness of breath. Reported on 10/26/2015    Historical Provider, MD  azithromycin (ZITHROMAX Z-PAK) 250 MG tablet Take 2 tablets first day and then 1 po a day for 4 days 04/03/16   Frederich Cha, MD  baclofen (LIORESAL) 10 MG tablet Take 10 mg by mouth 2 (two) times daily as needed for muscle spasms.     Historical Provider, MD  buPROPion (WELLBUTRIN) 75 MG tablet Take 75 mg by mouth 2 (two) times daily. Reported on 10/26/2015    Historical Provider, MD  celecoxib (CELEBREX) 200 MG capsule Take 200 mg by mouth 2 (two) times daily with a meal.    Historical Provider, MD  diclofenac sodium (VOLTAREN) 1 % GEL Apply 4 g topically 4 (four) times daily as needed (for pain).     Historical Provider, MD  DULoxetine (CYMBALTA) 60 MG capsule Take 60 mg by mouth daily. Reported on 10/26/2015    Historical Provider, MD  enalapril (VASOTEC) 5 MG tablet Take 5 mg by mouth daily.  Historical Provider, MD  finasteride (PROSCAR) 5 MG tablet Take 5 mg by mouth daily.    Historical Provider, MD  Fluticasone-Salmeterol (ADVAIR) 250-50 MCG/DOSE AEPB Inhale 1 puff into the lungs 2 (two) times daily. Reported on 10/26/2015    Historical Provider, MD  gabapentin (NEURONTIN) 400 MG capsule Take 800 mg by mouth 3 (three) times daily.    Historical Provider, MD  lactobacillus acidophilus & bulgar (LACTINEX) chewable tablet Chew 1 tablet by mouth 3 (three) times daily with meals. Patient not taking: Reported on  01/16/2016 10/27/15   Bettey Costa, MD  lidocaine (XYLOCAINE) 5 % ointment Apply 1 application topically See admin instructions. 2 to 4 times daily    Historical Provider, MD  Morphine-Naltrexone (EMBEDA) 20-0.8 MG CPCR Take 1 capsule by mouth 2 (two) times daily. Reported on 10/26/2015    Historical Provider, MD  naloxone [facility]) 2 MG/2ML injection Inject 2 mLs into the muscle as needed. For side effect/adverse reaction/opioid overdose    Historical Provider, MD  omeprazole (PRILOSEC) 40 MG capsule Take 40 mg by mouth 2 (two) times daily.    Historical Provider, MD  Oxycodone HCl 10 MG TABS Take 1 tablet (10 mg total) by mouth every 8 (eight) hours as needed (for pain). Patient not taking: Reported on 03/27/2016 02/10/16   Demetrios Loll, MD  predniSONE (DELTASONE) 10 MG tablet Take 10 mg by mouth daily.    Historical Provider, MD  predniSONE (STERAPRED UNI-PAK 21 TAB) 10 MG (21) TBPK tablet Sig 6 tablet day 1, 5 tablets day 2, 4 tablets day 3,,3tablets day 4, 2 tablets day 5, 1 tablet day 6 take all tablets orally 04/03/16   Frederich Cha, MD  simvastatin (ZOCOR) 20 MG tablet Take 20 mg by mouth at bedtime.     Historical Provider, MD  tamsulosin (FLOMAX) 0.4 MG CAPS capsule Take 0.4 mg by mouth daily.    Historical Provider, MD  tiotropium (SPIRIVA) 18 MCG inhalation capsule Place 18 mcg into inhaler and inhale daily. Reported on 10/26/2015    Historical Provider, MD  traMADol (ULTRAM) 50 MG tablet Take 50 mg by mouth daily as needed for moderate pain.     Historical Provider, MD  traZODone (DESYREL) 50 MG tablet Take 150 mg by mouth at bedtime.    Historical Provider, MD      DRUG ALLERGIES: No Known Allergies   REVIEW OF SYSTEMS: CONSTITUTIONAL: Positive fever/chills, fatigue, weakness, negative weight gain/loss, headache EYES: No blurry or double vision. ENT: No tinnitus, postnasal drip, redness or soreness of the oropharynx. RESPIRATORY: Has active cough, wheeze, dyspnea. Negative  hemoptysis CARDIOVASCULAR: No chest pain, orthopnea, palpitations, syncope. GASTROINTESTINAL: No nausea, vomiting, constipation, diarrhea, abdominal pain, hematemesis, melena or hematochezia. GENITOURINARY: No dysuria or hematuria. ENDOCRINE: No polyuria or nocturia. No heat or cold intolerance. HEMATOLOGY: No anemia, bruising, bleeding. INTEGUMENTARY: No rashes, ulcers, lesions. MUSCULOSKELETAL: No arthritis, swelling, gout. No neck pain. NEUROLOGIC: No numbness, tingling, weakness or ataxia. No seizure-type activity. PSYCHIATRIC: No anxiety, depression, insomnia.  PHYSICAL EXAMINATION: VITAL SIGNS: Blood pressure (!) 144/103, pulse (!) 113, temperature 97.5 F (36.4 C), resp. rate 18, height '5\' 11"'$  (1.803 m), weight 80.7 kg (178 lb), SpO2 93 %.  GENERAL: 61 y.o.-year-old white male patient, well-developed, well-nourished lying in the bed in no acute distress.  Pleasant and cooperative.   HEENT: Head atraumatic, normocephalic. Pupils equal, round, reactive to light and accommodation. No scleral icterus. Extraocular muscles intact. Nares are patent. Oropharynx is clear. Mucus membranes moist. NECK: Supple, full range of  motion. No JVD, no bruit heard. No thyroid enlargement, no tenderness, no cervical lymphadenopathy. CHEST: Normal breath sounds bilaterally. Very slight end expiratory wheeze but no rales, rhonchi or crackles. No use of accessory muscles of respiration.  There is left lateral reproducible chest wall tenderness.  CARDIOVASCULAR: S1, S2 normal. No murmurs, rubs, or gallops. Cap refill <2 seconds. ABDOMEN: Soft, nontender, nondistended. No rebound, guarding, rigidity. Normoactive bowel sounds present in all four quadrants. No organomegaly or mass. EXTREMITIES: Full range of motion. No pedal edema, cyanosis, or clubbing. NEUROLOGIC: Cranial nerves II through XII are grossly intact with no focal sensorimotor deficit. Muscle strength 5/5 in all extremities. Sensation intact. Gait not  checked. PSYCHIATRIC: The patient is alert and oriented x 3. Normal affect, mood, thought content. SKIN: Warm, dry, and intact without obvious rash, lesion, or ulcer.  LABORATORY PANEL:  CBC  Recent Labs Lab 04/06/16 1504  WBC 17.7*  HGB 15.2  HCT 46.0  PLT 331   ----------------------------------------------------------------------------------------------------------------- Chemistries  Recent Labs Lab 04/06/16 1504  NA 138  K 4.4  CL 104  CO2 27  GLUCOSE 154*  BUN 21*  CREATININE 0.95  CALCIUM 8.9   ------------------------------------------------------------------------------------------------------------------ Cardiac Enzymes  Recent Labs Lab 04/06/16 1504  TROPONINI <0.03   ------------------------------------------------------------------------------------------------------------------  RADIOLOGY: Dg Chest 2 View  Result Date: 04/06/2016 CLINICAL DATA:  Pneumonia 3 days ago. More trouble breathing and feeling worse. EXAM: CHEST  2 VIEW COMPARISON:  02/09/2016. FINDINGS: Lungs are hyperexpanded. Lingular airspace disease noted compatible with pneumonia. The cardiopericardial silhouette is within normal limits for size. The visualized bony structures of the thorax are intact. Telemetry leads overlie the chest. IMPRESSION: Hyperexpansion with airspace consolidation in the lingula, compatible with pneumonia. Electronically Signed   By: Misty Stanley M.D.   On: 04/06/2016 15:55   Ct Renal Stone Study  Result Date: 04/06/2016 CLINICAL DATA:  Left flank pain for 3 days EXAM: CT ABDOMEN AND PELVIS WITHOUT CONTRAST TECHNIQUE: Multidetector CT imaging of the abdomen and pelvis was performed following the standard protocol without IV contrast. COMPARISON:  01/16/2016 FINDINGS: Lower chest: There is tenting of right posterior hemidiaphragm containing fat. Hepatobiliary: Unenhanced liver is unremarkable. Status postcholecystectomy. Pancreas: Unenhanced pancreas is  unremarkable. Spleen: Unenhanced spleen is unremarkable. Adrenals/Urinary Tract: No adrenal gland mass. Unenhanced kidneys are symmetrical in size. No nephrolithiasis. No hydronephrosis or hydroureter. No calcified calculi are noted within urinary bladder. Stomach/Bowel: There is no small bowel obstruction. No gastric outlet obstruction. There is moderate gaseous distension of the cecum right colon and proximal transverse colon. There is fecal like material within terminal ileum most likely incompetent ileocecal valve. Normal appendix. No pericecal inflammation. Abundant stool noted in distal transverse colon and splenic flexure of the colon. Some colonic stool noted within descending colon. Mild fecal impaction cannot be excluded. Colonic diverticula are noted descending colon. Multiple sigmoid colon diverticula are noted. Some gas noted in mid sigmoid colon. There is redundant sigmoid colon. No evidence of acute diverticulitis or colitis. Vascular/Lymphatic: Atherosclerotic calcifications of abdominal aorta and iliac arteries. No aortic aneurysm. Reproductive: Prostate gland and seminal vesicles are unremarkable. Other: There is a left inguinal scrotal canal hernia containing fat measures 3.7 cm without evidence of acute complication. Musculoskeletal: No destructive bony lesions are noted. Sagittal images of the spine shows mild degenerative changes lumbar spine. IMPRESSION: 1. No nephrolithiasis.  No hydronephrosis or hydroureter. 2. No calcified ureteral calculi. 3. Moderate gaseous distension of the right colon cecum and proximal transverse colon. Abundant stool noted in distal transverse colon  and splenic flexure of the colon. Mild fecal impaction cannot be excluded. No definite evidence of distal colonic obstruction. 4. Colonic diverticula are noted descending colon and sigmoid colon. Redundant sigmoid colon. No evidence of acute colitis or diverticulitis. Some fecal like material noted within terminal ileum  probable incompetent ileocecal valve. 5. Mild degenerative changes lumbar spine. Electronically Signed   By: Lahoma Crocker M.D.   On: 04/06/2016 16:34    EKG: Sinus tachycardia at 113 bpm with right bundle branch block, normal axis and nonspecific ST and T wave changes.  IMPRESSION AND PLAN:  This is a 60 y.o. male with a history of hypertension, hyperlipidemia, COPD, chronic pain now being admitted with: 1. Community-acquired pneumonia, failed outpatient therapy in a patient with COPD-we will admit for IV fluid hydration, IV antibiotics with Levaquin, O2, sputum cultures, Mucinex, DuoNeb's, antipyretics and pain control. Continue Symbicort (in place of Advair), Spiriva and will utilize DuoNeb's as needed. Continue tapering steroids.  2. Left-sided chest pain which is reproducible in nature is likely secondary to cough. CT of the abdomen showed no evidence of nephrolithiasis but there is significant gaseous distention as well as mild fecal impaction could not be occluded secondary to abundant stool noted. We'll observe on telemetry given the patient's risk factors, check 3 troponins, repeat EKG in a.m. and 3. Constipation, possible mild fecal impaction. We will place this patient on a bowel regimen for his constipation. 3. Chronic opiate use in the setting of chronic pain-we'll continue patient's current home medication regimen. May need higher doses of narcotics for pain control. 4. Hypertension-continue enalapril 5. Hyperlipidemia-continue simvastatin 6. Anxiety continue trazodone, Cymbalta and Wellbutrin. 7. GERD-continue Prilosec  Diet/Nutrition: Heart healthy Fluids: IV normal saline DVT Px: Lovenox, SCDs and early ambulation Code Status: Full  All the records are reviewed and case discussed with ED provider. Management plans discussed with the patient and/or family who express understanding and agree with plan of care.   TOTAL TIME TAKING CARE OF THIS PATIENT: 60 minutes.   Sanjay Broadfoot D.O. on 04/06/2016 at 6:55 PM Between 7am to 6pm - Pager - [phone] After 6pm go to [email or web address] - [employer]reader [facility] Office [phone] CC: Primary care physician; Marygrace Drought, MD     Note: This dictation was prepared with Dragon dictation along with smaller phrase technology. Any transcriptional errors that result from this process are unintentional.

## 2016-04-06 NOTE — Consult Note (Signed)
Pharmacy Antibiotic Note  Stanley Rios. is a 60 y.o. male admitted on 04/06/2016 with pneumonia.  Pharmacy has been consulted for levofloxacin dosing. Pt received azithromycin op, condition worsened  Plan: levofloxacin '750mg'$  IV q 24hr  Height: '5\' 11"'$  (180.3 cm) Weight: 178 lb (80.7 kg) IBW/kg (Calculated) : 75.3  Temp (24hrs), Avg:97.5 F (36.4 C), Min:97.5 F (36.4 C), Max:97.5 F (36.4 C)   Recent Labs Lab 04/06/16 1504 04/06/16 1647  WBC 17.7*  --   CREATININE 0.95  --   LATICACIDVEN  --  0.9    Estimated Creatinine Clearance: 88.1 mL/min (by C-G formula based on SCr of 0.95 mg/dL).    No Known Allergies  Antimicrobials this admission: levofloxacin 9/10 >>   Dose adjustments this admission:   Microbiology results: 9/10 BCx:    Thank you for allowing pharmacy to be a part of this patient's care.  Ramond Dial, Pharm.D Clinical Pharmacist   04/06/2016 8:14 PM

## 2016-04-06 NOTE — ED Provider Notes (Signed)
[facility] Emergency Department Provider Note  ____________________________________________   I have reviewed the triage vital signs and the nursing notes.   HISTORY  Chief Complaint Flank Pain    HPI Stanley Cork. is a 60 y.o. male who presents today complaining of cough. He has had a cough for the last several days. Now he is having significant left-sided pain. As also complaining of left flank pain that seems to radiate down towards his groin. Patient has a history of COPD, anxiety chronic pain of various varieties and chronic obese abuse. He states that he was diagnosed with a pneumonia started on Zithromax a few days ago. He states his cough is not significantly improved. He denies fever.The pain is not pleuritic but it is worse when he coughs or changes position he cannot find a comfortable position in the bed.      Past Medical History:  Diagnosis Date  . Anxiety   . Chronic back pain   . Chronic neck pain   . COPD (chronic obstructive pulmonary disease) ([place])   . Depression   . Hyperlipidemia   . Hypertension   . Opiate abuse, continuous     Patient Active Problem List   Diagnosis Date Noted  . Community acquired pneumonia 04/06/2016  . DDD (degenerative disc disease), lumbar 03/27/2016  . Facet syndrome, lumbar 03/27/2016  . Sacroiliac joint dysfunction 03/27/2016  . DDD (degenerative disc disease), cervical 03/27/2016  . Cervical facet syndrome 03/27/2016  . Opiate withdrawal ([place]) 02/09/2016  . COPD (chronic obstructive pulmonary disease) ([place]) 02/09/2016  . HTN (hypertension) 02/09/2016  . Depression 02/09/2016  . Anxiety 02/09/2016  . Chronic pain 02/09/2016  . Neutrophilic leukocytosis 54/00/8676  . Cellulitis 10/26/2015    Past Surgical History:  Procedure Laterality Date  . CHOLECYSTECTOMY    . NECK SURGERY  2002 approx   fusion, about 12 years ago  . SPINAL FUSION     ?? C3- C4    Prior to Admission  medications   Medication Sig Start Date End Date Taking? Authorizing Provider  albuterol (PROVENTIL HFA;VENTOLIN HFA) 108 (90 Base) MCG/ACT inhaler Inhale 2 puffs into the lungs every 4 (four) hours as needed for wheezing or shortness of breath. Reported on 10/26/2015    Historical Provider, MD  azithromycin (ZITHROMAX Z-PAK) 250 MG tablet Take 2 tablets first day and then 1 po a day for 4 days 04/03/16   Frederich Cha, MD  baclofen (LIORESAL) 10 MG tablet Take 10 mg by mouth 2 (two) times daily as needed for muscle spasms.     Historical Provider, MD  buPROPion (WELLBUTRIN) 75 MG tablet Take 75 mg by mouth 2 (two) times daily. Reported on 10/26/2015    Historical Provider, MD  celecoxib (CELEBREX) 200 MG capsule Take 200 mg by mouth 2 (two) times daily with a meal.    Historical Provider, MD  diclofenac sodium (VOLTAREN) 1 % GEL Apply 4 g topically 4 (four) times daily as needed (for pain).     Historical Provider, MD  DULoxetine (CYMBALTA) 60 MG capsule Take 60 mg by mouth daily. Reported on 10/26/2015    Historical Provider, MD  enalapril (VASOTEC) 5 MG tablet Take 5 mg by mouth daily.    Historical Provider, MD  finasteride (PROSCAR) 5 MG tablet Take 5 mg by mouth daily.    Historical Provider, MD  Fluticasone-Salmeterol (ADVAIR) 250-50 MCG/DOSE AEPB Inhale 1 puff into the lungs 2 (two) times daily. Reported on 10/26/2015    Historical  Provider, MD  gabapentin (NEURONTIN) 400 MG capsule Take 800 mg by mouth 3 (three) times daily.    Historical Provider, MD  lactobacillus acidophilus & bulgar (LACTINEX) chewable tablet Chew 1 tablet by mouth 3 (three) times daily with meals. Patient not taking: Reported on 01/16/2016 10/27/15   Bettey Costa, MD  lidocaine (XYLOCAINE) 5 % ointment Apply 1 application topically See admin instructions. 2 to 4 times daily    Historical Provider, MD  Morphine-Naltrexone (EMBEDA) 20-0.8 MG CPCR Take 1 capsule by mouth 2 (two) times daily. Reported on 10/26/2015    Historical  Provider, MD  naloxone [facility]) 2 MG/2ML injection Inject 2 mLs into the muscle as needed. For side effect/adverse reaction/opioid overdose    Historical Provider, MD  omeprazole (PRILOSEC) 40 MG capsule Take 40 mg by mouth 2 (two) times daily.    Historical Provider, MD  Oxycodone HCl 10 MG TABS Take 1 tablet (10 mg total) by mouth every 8 (eight) hours as needed (for pain). Patient not taking: Reported on 03/27/2016 02/10/16   Demetrios Loll, MD  predniSONE (DELTASONE) 10 MG tablet Take 10 mg by mouth daily.    Historical Provider, MD  predniSONE (STERAPRED UNI-PAK 21 TAB) 10 MG (21) TBPK tablet Sig 6 tablet day 1, 5 tablets day 2, 4 tablets day 3,,3tablets day 4, 2 tablets day 5, 1 tablet day 6 take all tablets orally 04/03/16   Frederich Cha, MD  simvastatin (ZOCOR) 20 MG tablet Take 20 mg by mouth at bedtime.     Historical Provider, MD  tamsulosin (FLOMAX) 0.4 MG CAPS capsule Take 0.4 mg by mouth daily.    Historical Provider, MD  tiotropium (SPIRIVA) 18 MCG inhalation capsule Place 18 mcg into inhaler and inhale daily. Reported on 10/26/2015    Historical Provider, MD  traMADol (ULTRAM) 50 MG tablet Take 50 mg by mouth daily as needed for moderate pain.     Historical Provider, MD  traZODone (DESYREL) 50 MG tablet Take 150 mg by mouth at bedtime.    Historical Provider, MD    Allergies Review of patient's allergies indicates no known allergies.  Family History  Problem Relation Age of Onset  . Bladder Cancer Father     deceased    Social History Social History  Substance Use Topics  . Smoking status: Current Every Day Smoker    Packs/day: 0.50    Types: Cigarettes  . Smokeless tobacco: Never Used  . Alcohol use No    Review of Systems Constitutional: No fever/chills Eyes: No visual changes. ENT: No sore throat. No stiff neck no neck pain Cardiovascular: Denies chest pain. Respiratory: Denies shortness of breath. Gastrointestinal:   no vomiting.  No diarrhea.  No  constipation. Genitourinary: Negative for dysuria. Musculoskeletal: Negative lower extremity swelling Skin: Negative for rash. Neurological: Negative for severe headaches, focal weakness or numbness. 10-point ROS otherwise negative.  ____________________________________________   PHYSICAL EXAM:  VITAL SIGNS: ED Triage Vitals  Enc Vitals Group     BP 04/06/16 1456 (!) 157/97     Pulse Rate 04/06/16 1456 (!) 115     Resp 04/06/16 1709 18     Temp 04/06/16 1456 97.5 F (36.4 C)     Temp src --      SpO2 04/06/16 1456 95 %     Weight 04/06/16 1500 178 lb (80.7 kg)     Height 04/06/16 1500 '5\' 11"'$  (1.803 m)     Head Circumference --      Peak Flow --  Pain Score 04/06/16 1500 7     Pain Loc --      Pain Edu? --      Excl. in [place]? --     Constitutional: Alert and oriented. Well appearing and in no acute distress. Eyes: Conjunctivae are normal. PERRL. EOMI. Head: Atraumatic. Nose: No congestion/rhinnorhea. Mouth/Throat: Mucous membranes are moist.  Oropharynx non-erythematous. Neck: No stridor.   Nontender with no meningismus Chest:  Cardiovascular: Normal rate, regular rhythm. Grossly normal heart sounds.  Good peripheral circulation. Respiratory: Normal respiratory effort.  No retractions. Lungs CTAB. Chest content to palpation in the left flank/left ribs of the left chest wall, no flail chest is metatarsus area patient says "ouch that's the pain right there" and pulls back. No flail chest noted. No obvious rib fracture palpated. Abdominal: Soft and nontender. No distention. No guarding no rebound Back:  There is no focal tenderness or step off.  there is no midline tenderness there are no lesions noted. there is possibly left CVA tenderness although it also seems to be mostly in the ribs CVA tenderness Musculoskeletal: No lower extremity tenderness, no upper extremity tenderness. No joint effusions, no DVT signs strong distal pulses no edema Neurologic:  Normal speech and  language. No gross focal neurologic deficits are appreciated.  Skin:  Skin is warm, dry and intact. No rash noted. Psychiatric: Mood and affect are normal. Speech and behavior are normal.  ____________________________________________   LABS (all labs ordered are listed, but only abnormal results are displayed)  Labs Reviewed  BASIC METABOLIC PANEL - Abnormal; Notable for the following:       Result Value   Glucose, Bld 154 (*)    BUN 21 (*)    All other components within normal limits  CBC - Abnormal; Notable for the following:    WBC 17.7 (*)    RDW 15.8 (*)    All other components within normal limits  CULTURE, BLOOD (ROUTINE X 2)  CULTURE, BLOOD (ROUTINE X 2)  LACTIC ACID, PLASMA  URINALYSIS COMPLETEWITH MICROSCOPIC ([facility] ONLY)  LACTIC ACID, PLASMA   ____________________________________________  EKG  I personally interpreted any EKGs ordered by me or triage Normal sinus rhythm, mild tachycardia noted rate 113 bpm no acute ST elevation or depression, right bundle-branch block noted. No acute ischemia. Normal axis. ____________________________________________  VZCHYIFOY  I reviewed any imaging ordered by me or triage that were performed during my shift and, if possible, patient and/or family made aware of any abnormal findings. ____________________________________________   PROCEDURES  Procedure(s) performed: None  Procedures  Critical Care performed: None  ____________________________________________   INITIAL IMPRESSION / ASSESSMENT AND PLAN / ED COURSE  Pertinent labs & imaging results that were available during my care of the patient were reviewed by me and considered in my medical decision making (see chart for details).  Patient with very reproducible chest wall pain in the context of a pneumonia. He is mildly tachycardic his lactic is negative. I do not think this presents PE given his history of COPD, and cough with chest x-ray findings consistent with  pneumonia. The patient has had no fever here and does not appear to be septic but he does appear to be uncomfortable. This is obvious in the context of chronic pain medication abuse which is somewhat difficult to interpret. Patient states he has not quite run out of his pain medications at home although these that are always difficult to confirm. In any event, given him pain medication and the context of what  his document his lingual pneumonia and a COPD patient who is on antibiotics and distant sent it to cough secondary to pain, I think at this time it is in his best interest to be admitted to the hospital. This will help her pulmonary toilet full her observation. I do not think a CT scan of his chest is warranted. I do CT of his left flank as the patient was also complaining of flank pain going towards the groin. We see no obvious etiology for that and I do think this is referred rib cage at this time. There is specifically no evidence of a splenic injury. Discussed with the hospitalist agrees with plan and management and will admit.  Clinical Course   ____________________________________________   FINAL CLINICAL IMPRESSION(S) / ED DIAGNOSES  Final diagnoses:  Flank pain  Aspiration pneumonia of left lung, unspecified aspiration pneumonia type, unspecified part of lung ([place])      This chart was dictated using voice recognition software.  Despite best efforts to proofread,  errors can occur which can change meaning.      Schuyler Amor, MD 04/06/16 [phone]

## 2016-04-06 NOTE — ED Triage Notes (Signed)
Pt states was seen and treated for pneumonia approx 3 days ago at urgent care. Pt states has been taking his abx and prednisone as directed. Pt states approx 3 days ago he developed L sided flank pain. Denies any hx of kidney stones. Pt states took Tramadol from previous prescription without relief at this time. Pt is visibly uncomfortable at this time. Pt has hx of chronic COPD at this time.

## 2016-04-07 DIAGNOSIS — A419 Sepsis, unspecified organism: Secondary | ICD-10-CM | POA: Diagnosis not present

## 2016-04-07 DIAGNOSIS — J189 Pneumonia, unspecified organism: Secondary | ICD-10-CM | POA: Diagnosis not present

## 2016-04-07 DIAGNOSIS — J449 Chronic obstructive pulmonary disease, unspecified: Secondary | ICD-10-CM | POA: Diagnosis not present

## 2016-04-07 LAB — CBC
HEMATOCRIT: 41.4 % (ref 40.0–52.0)
Hemoglobin: 14.2 g/dL (ref 13.0–18.0)
MCH: 33 pg (ref 26.0–34.0)
MCHC: 34.4 g/dL (ref 32.0–36.0)
MCV: 96 fL (ref 80.0–100.0)
Platelets: 288 10*3/uL (ref 150–440)
RBC: 4.31 MIL/uL — AB (ref 4.40–5.90)
RDW: 15.5 % — ABNORMAL HIGH (ref 11.5–14.5)
WBC: 13.5 10*3/uL — AB (ref 3.8–10.6)

## 2016-04-07 LAB — BASIC METABOLIC PANEL
ANION GAP: 5 (ref 5–15)
BUN: 18 mg/dL (ref 6–20)
CHLORIDE: 106 mmol/L (ref 101–111)
CO2: 27 mmol/L (ref 22–32)
Calcium: 8.2 mg/dL — ABNORMAL LOW (ref 8.9–10.3)
Creatinine, Ser: 1.01 mg/dL (ref 0.61–1.24)
Glucose, Bld: 159 mg/dL — ABNORMAL HIGH (ref 65–99)
POTASSIUM: 4.9 mmol/L (ref 3.5–5.1)
SODIUM: 138 mmol/L (ref 135–145)

## 2016-04-07 LAB — TROPONIN I: Troponin I: <0.03 ng/mL (ref <0.03)

## 2016-04-07 MED ORDER — MORPHINE SULFATE ER 15 MG PO TBCR
15.0000 mg | EXTENDED_RELEASE_TABLET | Freq: Two times a day (BID) | ORAL | Status: DC
  Administered 2016-04-07 – 2016-04-16 (×19): 15 mg via ORAL
  Filled 2016-04-07 (×19): qty 1

## 2016-04-07 MED ORDER — MORPHINE SULFATE (PF) 2 MG/ML IV SOLN
2.0000 mg | INTRAVENOUS | Status: DC | PRN
  Administered 2016-04-07 – 2016-04-16 (×17): 2 mg via INTRAVENOUS
  Filled 2016-04-07 (×17): qty 1

## 2016-04-07 MED ORDER — LABETALOL HCL 5 MG/ML IV SOLN
10.0000 mg | INTRAVENOUS | Status: DC | PRN
  Administered 2016-04-07: 10 mg via INTRAVENOUS
  Filled 2016-04-07: qty 4

## 2016-04-07 MED ORDER — SODIUM CHLORIDE 0.9 % IV BOLUS (SEPSIS)
500.0000 mL | Freq: Once | INTRAVENOUS | Status: AC
  Administered 2016-04-07: 500 mL via INTRAVENOUS

## 2016-04-07 MED ORDER — GUAIFENESIN-CODEINE 100-10 MG/5ML PO SOLN
10.0000 mL | ORAL | Status: DC | PRN
Start: 2016-04-07 — End: 2016-04-18
  Administered 2016-04-07 – 2016-04-14 (×10): 10 mL via ORAL
  Filled 2016-04-07 (×10): qty 10

## 2016-04-07 MED ORDER — OXYCODONE HCL 5 MG PO TABS
5.0000 mg | ORAL_TABLET | ORAL | Status: DC | PRN
  Administered 2016-04-07 – 2016-04-16 (×21): 5 mg via ORAL
  Filled 2016-04-07 (×21): qty 1

## 2016-04-07 MED ORDER — METHYLPREDNISOLONE SODIUM SUCC 125 MG IJ SOLR
60.0000 mg | INTRAMUSCULAR | Status: DC
  Administered 2016-04-08 – 2016-04-10 (×3): 60 mg via INTRAVENOUS
  Filled 2016-04-07 (×3): qty 2

## 2016-04-07 NOTE — Care Management Note (Addendum)
Case Management Note  Patient Details  Name: Stanley Rios. MRN: [id number] Date of Birth: 09/20/1955  Subjective/Objective:        60yo Mr Stanley Rios was admitted with PNA after failing outpatient treatment. Hx: COPD, HTN, Chronic neck and back pain. Resides at home with wife. PCP=Mark Heffington. Pharmacy=[facility] in [place]. Home equipment consists of a RW and a cane. No home oxygen. No current home health services. No choice of provider. Drives self to appointments. Currently 99% O2 [place] on room air. Do not anticipate any home needs. Case management will follow for discharge planning.           Action/Plan:   Expected Discharge Date:                  Expected Discharge Plan:     In-House Referral:     Discharge planning Services     Post Acute Care Choice:    Choice offered to:     DME Arranged:    DME Agency:     HH Arranged:    HH Agency:     Status of Service:     If discussed at [employer] of Stay Meetings, dates discussed:    Additional Comments:  Prabhleen Montemayor A, RN 04/07/2016, 1:53 PM

## 2016-04-07 NOTE — Progress Notes (Signed)
Pt's HR sustaining 130's ST. Pt denies any chest discomfort except with cough, has ongoing mild SOB, denies any new distress. Dr. Jannifer Franklin notified, new orders received. Bolus initiated, will monitor. Pt advised to notify RN if any new distress develops.

## 2016-04-07 NOTE — Progress Notes (Signed)
[place] [facility] [place] NAME: Stanley Rios    MRN#:  [id number]  DATE OF BIRTH:  July 25, 1956  SUBJECTIVE:  Hospital Day: 1 day Stanley Rios is a 60 y.o. male presenting with Flank Pain .   Overnight events: No overnight events Interval Events: Still some difficulty of breathing, complains of cough and chest pain  REVIEW OF SYSTEMS:  CONSTITUTIONAL: No fever, fatigue or weakness.  EYES: No blurred or double vision.  EARS, NOSE, AND THROAT: No tinnitus or ear pain.  RESPIRATORY: Positive cough, shortness of breath, denies wheezing or hemoptysis.  CARDIOVASCULAR: Positive chest pain, denies orthopnea, edema.  GASTROINTESTINAL: No nausea, vomiting, diarrhea or abdominal pain.  GENITOURINARY: No dysuria, hematuria.  ENDOCRINE: No polyuria, nocturia,  HEMATOLOGY: No anemia, easy bruising or bleeding SKIN: No rash or lesion. MUSCULOSKELETAL: No joint pain or arthritis.   NEUROLOGIC: No tingling, numbness, weakness.  PSYCHIATRY: No anxiety or depression.   DRUG ALLERGIES:  No Known Allergies  VITALS:  Blood pressure (!) 151/89, pulse (!) 118, temperature 97.6 F (36.4 C), temperature source Oral, resp. rate 17, height '5\' 11"'$  (1.803 m), weight 84.6 kg (186 lb 6.4 oz), SpO2 91 %.  PHYSICAL EXAMINATION:  VITAL SIGNS: Vitals:   04/07/16 0507 04/07/16 0931  BP: (!) 150/97 (!) 151/89  Pulse: (!) 118   Resp: 17   Temp: 97.6 F (36.4 C)    GENERAL:60 y.o.male currently in no acute distress.  HEAD: Normocephalic, atraumatic.  EYES: Pupils equal, round, reactive to light. Extraocular muscles intact. No scleral icterus.  MOUTH: Moist mucosal membrane. Dentition intact. No abscess noted.  EAR, NOSE, THROAT: Clear without exudates. No external lesions.  NECK: Supple. No thyromegaly. No nodules. No JVD.  PULMONARY: Coarse rhonchi left lung without wheeze rails or . No use of accessory muscles, Good respiratory effort. good air entry  bilaterally CHEST: Nontender to palpation.  CARDIOVASCULAR: S1 and S2. Regular rate and rhythm. No murmurs, rubs, or gallops. No edema. Pedal pulses 2+ bilaterally.  GASTROINTESTINAL: Soft, nontender, nondistended. No masses. Positive bowel sounds. No hepatosplenomegaly.  MUSCULOSKELETAL: No swelling, clubbing, or edema. Range of motion full in all extremities.  NEUROLOGIC: Cranial nerves II through XII are intact. No gross focal neurological deficits. Sensation intact. Reflexes intact.  SKIN: No ulceration, lesions, rashes, or cyanosis. Skin warm and dry. Turgor intact.  PSYCHIATRIC: Mood, affect within normal limits. The patient is awake, alert and oriented x 3. Insight, judgment intact.      LABORATORY PANEL:   CBC  Recent Labs Lab 04/07/16 0604  WBC 13.5*  HGB 14.2  HCT 41.4  PLT 288   ------------------------------------------------------------------------------------------------------------------  Chemistries   Recent Labs Lab 04/06/16 1900 04/07/16 0604  NA  --  138  K  --  4.9  CL  --  106  CO2  --  27  GLUCOSE  --  159*  BUN  --  18  CREATININE  --  1.01  CALCIUM  --  8.2*  MG 2.0  --    ------------------------------------------------------------------------------------------------------------------  Cardiac Enzymes  Recent Labs Lab 04/07/16 1239  TROPONINI <0.03   ------------------------------------------------------------------------------------------------------------------  RADIOLOGY:  Dg Chest 2 View  Result Date: 04/06/2016 CLINICAL DATA:  Pneumonia 3 days ago. More trouble breathing and feeling worse. EXAM: CHEST  2 VIEW COMPARISON:  02/09/2016. FINDINGS: Lungs are hyperexpanded. Lingular airspace disease noted compatible with pneumonia. The cardiopericardial silhouette is within normal limits for size. The visualized bony structures of the thorax are intact. Telemetry  leads overlie the chest. IMPRESSION: Hyperexpansion with airspace  consolidation in the lingula, compatible with pneumonia. Electronically Signed   By: Misty Stanley M.D.   On: 04/06/2016 15:55   Ct Renal Stone Study  Result Date: 04/06/2016 CLINICAL DATA:  Left flank pain for 3 days EXAM: CT ABDOMEN AND PELVIS WITHOUT CONTRAST TECHNIQUE: Multidetector CT imaging of the abdomen and pelvis was performed following the standard protocol without IV contrast. COMPARISON:  01/16/2016 FINDINGS: Lower chest: There is tenting of right posterior hemidiaphragm containing fat. Hepatobiliary: Unenhanced liver is unremarkable. Status postcholecystectomy. Pancreas: Unenhanced pancreas is unremarkable. Spleen: Unenhanced spleen is unremarkable. Adrenals/Urinary Tract: No adrenal gland mass. Unenhanced kidneys are symmetrical in size. No nephrolithiasis. No hydronephrosis or hydroureter. No calcified calculi are noted within urinary bladder. Stomach/Bowel: There is no small bowel obstruction. No gastric outlet obstruction. There is moderate gaseous distension of the cecum right colon and proximal transverse colon. There is fecal like material within terminal ileum most likely incompetent ileocecal valve. Normal appendix. No pericecal inflammation. Abundant stool noted in distal transverse colon and splenic flexure of the colon. Some colonic stool noted within descending colon. Mild fecal impaction cannot be excluded. Colonic diverticula are noted descending colon. Multiple sigmoid colon diverticula are noted. Some gas noted in mid sigmoid colon. There is redundant sigmoid colon. No evidence of acute diverticulitis or colitis. Vascular/Lymphatic: Atherosclerotic calcifications of abdominal aorta and iliac arteries. No aortic aneurysm. Reproductive: Prostate gland and seminal vesicles are unremarkable. Other: There is a left inguinal scrotal canal hernia containing fat measures 3.7 cm without evidence of acute complication. Musculoskeletal: No destructive bony lesions are noted. Sagittal images  of the spine shows mild degenerative changes lumbar spine. IMPRESSION: 1. No nephrolithiasis.  No hydronephrosis or hydroureter. 2. No calcified ureteral calculi. 3. Moderate gaseous distension of the right colon cecum and proximal transverse colon. Abundant stool noted in distal transverse colon and splenic flexure of the colon. Mild fecal impaction cannot be excluded. No definite evidence of distal colonic obstruction. 4. Colonic diverticula are noted descending colon and sigmoid colon. Redundant sigmoid colon. No evidence of acute colitis or diverticulitis. Some fecal like material noted within terminal ileum probable incompetent ileocecal valve. 5. Mild degenerative changes lumbar spine. Electronically Signed   By: Lahoma Crocker M.D.   On: 04/06/2016 16:34    EKG:   Orders placed or performed during the hospital encounter of 03/23/16  . ED EKG  . ED EKG  . EKG 12-Lead  . EKG 12-Lead    ASSESSMENT AND PLAN:   Stanley Rios is a 60 y.o. male presenting with Flank Pain . Admitted 04/06/2016 : Day #: 1 day 1. Sepsis, present on admission: Community acquired pneumonia continue current antibiotic regimen, follow culture and adjust antibiotics accordingly continue breathing treatments, and decrease steroids 2. Chronic pain restart long-acting morphine 3. Essential hypertension: Enalapril 4. Hyperlipidemia unspecified: Statin therapy 5. GERD without esophagitis: PPI therapy   All the records are reviewed and case discussed with Care Management/Social Workerr. Management plans discussed with the patient, family and they are in agreement.  CODE STATUS: full TOTAL TIME TAKING CARE OF THIS PATIENT: 28 minutes.   POSSIBLE D/C IN 2-3DAYS, DEPENDING ON CLINICAL CONDITION.   Shiniqua Groseclose,  Karenann Cai.D on 04/07/2016 at 1:18 PM  Between 7am to 6pm - Pager - [phone]  After 6pm: House Pager: - [facility]  Office  [phone]  CC: Primary care physician; Marygrace Drought, MD

## 2016-04-07 NOTE — Care Management Important Message (Signed)
Important Message  Patient Details  Name: Stanley Rios. MRN: [id number] Date of Birth: 1956-01-13   Medicare Important Message Given:  Yes    Chaske Paskett A, RN 04/07/2016, 6:55 AM

## 2016-04-07 NOTE — Progress Notes (Signed)
500 cc bolus complete. Pt remains ST (130's). BP 161/105, denies any new distress. Dr. Jannifer Franklin notified, will administer labetolol and monitor.

## 2016-04-08 ENCOUNTER — Other Ambulatory Visit: Payer: Self-pay | Admitting: *Deleted

## 2016-04-08 DIAGNOSIS — Z716 Tobacco abuse counseling: Secondary | ICD-10-CM | POA: Diagnosis not present

## 2016-04-08 DIAGNOSIS — J189 Pneumonia, unspecified organism: Secondary | ICD-10-CM | POA: Diagnosis not present

## 2016-04-08 DIAGNOSIS — J9601 Acute respiratory failure with hypoxia: Secondary | ICD-10-CM | POA: Diagnosis not present

## 2016-04-08 DIAGNOSIS — J449 Chronic obstructive pulmonary disease, unspecified: Secondary | ICD-10-CM | POA: Diagnosis not present

## 2016-04-08 DIAGNOSIS — A419 Sepsis, unspecified organism: Secondary | ICD-10-CM | POA: Diagnosis not present

## 2016-04-08 LAB — TOXASSURE SELECT 13 (MW), URINE

## 2016-04-08 MED ORDER — GUAIFENESIN ER 600 MG PO TB12
600.0000 mg | ORAL_TABLET | Freq: Once | ORAL | Status: AC
  Administered 2016-04-08: 600 mg via ORAL
  Filled 2016-04-08: qty 1

## 2016-04-08 MED ORDER — DM-GUAIFENESIN ER 30-600 MG PO TB12: 1.0000 | ORAL_TABLET | Freq: Two times a day (BID) | ORAL | Status: DC

## 2016-04-08 MED ORDER — SENNA 8.6 MG PO TABS
1.0000 | ORAL_TABLET | Freq: Every day | ORAL | Status: DC
  Administered 2016-04-08 – 2016-04-11 (×4): 8.6 mg via ORAL
  Filled 2016-04-08 (×4): qty 1

## 2016-04-08 MED ORDER — INFLUENZA VAC SPLIT QUAD 0.5 ML IM SUSY
0.5000 mL | PREFILLED_SYRINGE | INTRAMUSCULAR | Status: AC
  Administered 2016-04-09: 0.5 mL via INTRAMUSCULAR
  Filled 2016-04-08: qty 0.5

## 2016-04-08 MED ORDER — POLYETHYLENE GLYCOL 3350 17 G PO PACK
17.0000 g | PACK | Freq: Every day | ORAL | Status: DC
  Administered 2016-04-08 – 2016-04-17 (×8): 17 g via ORAL
  Filled 2016-04-08 (×11): qty 1

## 2016-04-08 MED ORDER — GUAIFENESIN ER 600 MG PO TB12
600.0000 mg | ORAL_TABLET | Freq: Two times a day (BID) | ORAL | Status: DC
  Administered 2016-04-09 – 2016-04-18 (×12): 600 mg via ORAL
  Filled 2016-04-08 (×21): qty 1

## 2016-04-08 MED ORDER — DILTIAZEM HCL ER 90 MG PO CP12
90.0000 mg | ORAL_CAPSULE | Freq: Two times a day (BID) | ORAL | Status: DC
  Administered 2016-04-08 – 2016-04-18 (×21): 90 mg via ORAL
  Filled 2016-04-08 (×23): qty 1

## 2016-04-08 MED ORDER — DEXTROMETHORPHAN POLISTIREX ER 30 MG/5ML PO SUER
30.0000 mg | Freq: Two times a day (BID) | ORAL | Status: DC
  Administered 2016-04-08 – 2016-04-17 (×8): 30 mg via ORAL
  Filled 2016-04-08 (×23): qty 5

## 2016-04-08 NOTE — Progress Notes (Signed)
PT Cancellation Note  Patient Details Name: Stanley Rios. MRN: [id number] DOB: September 30, 1955   Cancelled Treatment:    Reason Eval/Treat Not Completed: Other (comment) (Pt on Hold from PT; vitals elevated (HR: 131 bpm, RR: 24) PT will monitor vitals and attempt evaluation at a later time as able.)   Riley Nearing, SPT 04/08/2016, 2:11 PM

## 2016-04-08 NOTE — Progress Notes (Signed)
[facility] [place] NAME: Stanley Rios    MR#:  [id number]  DATE OF BIRTH:  22-Sep-1955  SUBJECTIVE:   Patient feels better than on admission but still has wheezing and cough.  REVIEW OF SYSTEMS:    Review of Systems  Constitutional: Negative for chills, fever and malaise/fatigue.  HENT: Negative.  Negative for ear discharge, ear pain, hearing loss, nosebleeds and sore throat.   Eyes: Negative.  Negative for blurred vision and pain.  Respiratory: Positive for cough, shortness of breath and wheezing. Negative for hemoptysis and sputum production.   Cardiovascular: Negative.  Negative for chest pain, palpitations and leg swelling.  Gastrointestinal: Negative.  Negative for abdominal pain, blood in stool, diarrhea, nausea and vomiting.  Genitourinary: Negative.  Negative for dysuria.  Musculoskeletal: Negative.  Negative for back pain.  Skin: Negative.   Neurological: Positive for weakness. Negative for dizziness, tremors, speech change, focal weakness, seizures and headaches.  Endo/Heme/Allergies: Negative.  Does not bruise/bleed easily.  Psychiatric/Behavioral: Negative.  Negative for depression, hallucinations and suicidal ideas.    Tolerating Diet: yes      DRUG ALLERGIES:  No Known Allergies  VITALS:  Blood pressure (!) 155/93, pulse (!) 125, temperature 97.5 F (36.4 C), temperature source Oral, resp. rate (!) 21, height '5\' 11"'$  (1.803 m), weight 84.6 kg (186 lb 6.4 oz), SpO2 95 %.  PHYSICAL EXAMINATION:   Physical Exam  Constitutional: He is oriented to person, place, and time and well-developed, well-nourished, and in no distress. No distress.  HENT:  Head: Normocephalic.  Eyes: No scleral icterus.  Neck: Normal range of motion. Neck supple. No JVD present. No tracheal deviation present.  Cardiovascular: Normal rate, regular rhythm and normal heart sounds.  Exam reveals no gallop and no friction rub.   No murmur  heard. Pulmonary/Chest: Effort normal. No respiratory distress. He has wheezes. He has no rales. He exhibits no tenderness.  Abdominal: Soft. Bowel sounds are normal. He exhibits no distension and no mass. There is no tenderness. There is no rebound and no guarding.  Musculoskeletal: Normal range of motion. He exhibits no edema.  Neurological: He is alert and oriented to person, place, and time.  Skin: Skin is warm. No rash noted. No erythema.  Psychiatric: Affect and judgment normal.      LABORATORY PANEL:   CBC  Recent Labs Lab 04/07/16 0604  WBC 13.5*  HGB 14.2  HCT 41.4  PLT 288   ------------------------------------------------------------------------------------------------------------------  Chemistries   Recent Labs Lab 04/06/16 1900 04/07/16 0604  NA  --  138  K  --  4.9  CL  --  106  CO2  --  27  GLUCOSE  --  159*  BUN  --  18  CREATININE  --  1.01  CALCIUM  --  8.2*  MG 2.0  --    ------------------------------------------------------------------------------------------------------------------  Cardiac Enzymes  Recent Labs Lab 04/06/16 2346 04/07/16 0604 04/07/16 1239  TROPONINI <0.03 <0.03 <0.03   ------------------------------------------------------------------------------------------------------------------  RADIOLOGY:  Dg Chest 2 View  Result Date: 04/06/2016 CLINICAL DATA:  Pneumonia 3 days ago. More trouble breathing and feeling worse. EXAM: CHEST  2 VIEW COMPARISON:  02/09/2016. FINDINGS: Lungs are hyperexpanded. Lingular airspace disease noted compatible with pneumonia. The cardiopericardial silhouette is within normal limits for size. The visualized bony structures of the thorax are intact. Telemetry leads overlie the chest. IMPRESSION: Hyperexpansion with airspace consolidation in the lingula, compatible with pneumonia. Electronically Signed   By: Misty Stanley  M.D.   On: 04/06/2016 15:55   Ct Renal Stone Study  Result Date:  04/06/2016 CLINICAL DATA:  Left flank pain for 3 days EXAM: CT ABDOMEN AND PELVIS WITHOUT CONTRAST TECHNIQUE: Multidetector CT imaging of the abdomen and pelvis was performed following the standard protocol without IV contrast. COMPARISON:  01/16/2016 FINDINGS: Lower chest: There is tenting of right posterior hemidiaphragm containing fat. Hepatobiliary: Unenhanced liver is unremarkable. Status postcholecystectomy. Pancreas: Unenhanced pancreas is unremarkable. Spleen: Unenhanced spleen is unremarkable. Adrenals/Urinary Tract: No adrenal gland mass. Unenhanced kidneys are symmetrical in size. No nephrolithiasis. No hydronephrosis or hydroureter. No calcified calculi are noted within urinary bladder. Stomach/Bowel: There is no small bowel obstruction. No gastric outlet obstruction. There is moderate gaseous distension of the cecum right colon and proximal transverse colon. There is fecal like material within terminal ileum most likely incompetent ileocecal valve. Normal appendix. No pericecal inflammation. Abundant stool noted in distal transverse colon and splenic flexure of the colon. Some colonic stool noted within descending colon. Mild fecal impaction cannot be excluded. Colonic diverticula are noted descending colon. Multiple sigmoid colon diverticula are noted. Some gas noted in mid sigmoid colon. There is redundant sigmoid colon. No evidence of acute diverticulitis or colitis. Vascular/Lymphatic: Atherosclerotic calcifications of abdominal aorta and iliac arteries. No aortic aneurysm. Reproductive: Prostate gland and seminal vesicles are unremarkable. Other: There is a left inguinal scrotal canal hernia containing fat measures 3.7 cm without evidence of acute complication. Musculoskeletal: No destructive bony lesions are noted. Sagittal images of the spine shows mild degenerative changes lumbar spine. IMPRESSION: 1. No nephrolithiasis.  No hydronephrosis or hydroureter. 2. No calcified ureteral calculi. 3.  Moderate gaseous distension of the right colon cecum and proximal transverse colon. Abundant stool noted in distal transverse colon and splenic flexure of the colon. Mild fecal impaction cannot be excluded. No definite evidence of distal colonic obstruction. 4. Colonic diverticula are noted descending colon and sigmoid colon. Redundant sigmoid colon. No evidence of acute colitis or diverticulitis. Some fecal like material noted within terminal ileum probable incompetent ileocecal valve. 5. Mild degenerative changes lumbar spine. Electronically Signed   By: Lahoma Crocker M.D.   On: 04/06/2016 16:34     ASSESSMENT AND PLAN:   60 year old male with a history of COPD and tobacco dependence who presents with community-acquired pneumonia and COPD exacerbation.  1. Acute hypoxic respiratory failure in the setting of community acquired pneumonia and COPD exacerbation. Wean oxygen as tolerated.  2. Community acquired pneumonia: Continue Levaquin  3. Sepsis present on admission with leukocytosis and tachycardia: This is due to community-acquired pneumonia. Sepsis is resolving.  4. COPD exacerbation: Patient continues to have bilateral wheezing on examination today. Continue IV steroids and inhalers. Continue Levaquin for pneumonia. Next line continue Mucinex.  5. Tobacco dependence: Patient is highly encouraged assessment. Patient counseled for 3 minutes regarding quitting smoking. He is agreeable for nicotine patch.  6. Hyperlipidemia: Continue simvastatin.  7. BPH: Continue Flomax  8. Essential hypertension: Continue enalapril and restart diltiazem.  9. Opiate abuse: Patient is currently on MS Contin and oxycodone when necessary. Will not escalate narcotics.  Management plans discussed with the patient and he is in agreement.  CODE STATUS: full  TOTAL TIME TAKING CARE OF THIS PATIENT: 30 minutes.    Rounded with nurse today. POSSIBLE D/C 2 days, DEPENDING ON CLINICAL CONDITION.   Stanley Rios,  Stanley Rios M.D on 04/08/2016 at 12:08 PM  Between 7am to 6pm - Pager - [phone] After 6pm go to [email or web address] - password [email or web address]  [facility]  Office  [phone]  CC: Primary care physician; Marygrace Drought, MD  Note: This dictation was prepared with Dragon dictation along with smaller phrase technology. Any transcriptional errors that result from this process are unintentional.

## 2016-04-08 NOTE — Progress Notes (Signed)
Pt's O2 sats 85% on RA, HR 120's (ST). Nebulizer given, then pt placed on 2L., sats immediately up to 95%, HR 109. Denies distress.

## 2016-04-08 NOTE — Consult Note (Signed)
   [facility] CM Inpatient Consult   04/08/2016  Stanley Rios. 11-Nov-1955 [id number]  Chart review revealed patient eligible for [place] Management services with a diagnosis of COPD for post hospital discharge follow up. Patient was evaluated for community based chronic disease management services with [facility] care Management Program as a benefit of patient's [employer] Medicare. Met with the patient at the bedside to explain [place] Management services. Patient endorses his primary care provider to be Dr. Marygrace Drought. Consent form signed. Patient gave [phone] (H) and [phone] (M) as the best numbers to reach him. He also gave written permission to speak with his spouse [name].Patient will receive post hospital discharge calls and be evaluated for monthly home visits. [facility] Care Management services does not interfere with or replace any services arranged by the inpatient care management team. RNCM left contact information and [facility] literature at the bedside. For additional questions please contact:   Disa Riedlinger RN, [facility] Liaison  [phone]) Business Mobile [phone]) Toll free office

## 2016-04-09 ENCOUNTER — Encounter: Payer: Self-pay | Admitting: Radiology

## 2016-04-09 ENCOUNTER — Inpatient Hospital Stay: Payer: PPO

## 2016-04-09 DIAGNOSIS — R0902 Hypoxemia: Secondary | ICD-10-CM | POA: Diagnosis not present

## 2016-04-09 DIAGNOSIS — A419 Sepsis, unspecified organism: Secondary | ICD-10-CM | POA: Diagnosis not present

## 2016-04-09 DIAGNOSIS — J9601 Acute respiratory failure with hypoxia: Secondary | ICD-10-CM | POA: Diagnosis not present

## 2016-04-09 DIAGNOSIS — J189 Pneumonia, unspecified organism: Secondary | ICD-10-CM | POA: Diagnosis not present

## 2016-04-09 DIAGNOSIS — J449 Chronic obstructive pulmonary disease, unspecified: Secondary | ICD-10-CM | POA: Diagnosis not present

## 2016-04-09 MED ORDER — DILTIAZEM HCL ER 90 MG PO CP12: 90.0000 mg | ORAL_CAPSULE | Freq: Two times a day (BID) | ORAL | 0 refills | Status: DC

## 2016-04-09 MED ORDER — IOPAMIDOL (ISOVUE-370) INJECTION 76%
75.0000 mL | Freq: Once | INTRAVENOUS | Status: AC | PRN
  Administered 2016-04-09: 75 mL via INTRAVENOUS

## 2016-04-09 MED ORDER — LEVALBUTEROL HCL 1.25 MG/0.5ML IN NEBU
1.2500 mg | INHALATION_SOLUTION | Freq: Four times a day (QID) | RESPIRATORY_TRACT | Status: DC | PRN
Start: 2016-04-09 — End: 2016-04-18
  Administered 2016-04-10: 1.25 mg via RESPIRATORY_TRACT
  Filled 2016-04-09: qty 0.5

## 2016-04-09 MED ORDER — PREDNISONE 10 MG PO TABS: 10.0000 mg | ORAL_TABLET | Freq: Every day | ORAL | 0 refills | Status: DC

## 2016-04-09 MED ORDER — LEVOFLOXACIN 750 MG PO TABS: 750.0000 mg | ORAL_TABLET | Freq: Every day | ORAL | 0 refills | Status: DC

## 2016-04-09 NOTE — Progress Notes (Signed)
[place] [place] NAME: [name]    MR#:  [id number]  DATE OF BIRTH:  July 05, 1956  SUBJECTIVE:   Patient Is feeling better this morning. Wheezing has improved. His heart rate was elevated this morning and on exertion.  REVIEW OF SYSTEMS:    Review of Systems  Constitutional: Negative for chills, fever and malaise/fatigue.  HENT: Negative.  Negative for ear discharge, ear pain, hearing loss, nosebleeds and sore throat.   Eyes: Negative.  Negative for blurred vision and pain.  Respiratory: Positive for cough. Negative for hemoptysis, sputum production and wheezing.   Cardiovascular: Negative.  Negative for chest pain, palpitations and leg swelling.  Gastrointestinal: Negative.  Negative for abdominal pain, blood in stool, diarrhea, nausea and vomiting.  Genitourinary: Negative.  Negative for dysuria.  Musculoskeletal: Negative.  Negative for back pain.  Skin: Negative.   Neurological: Negative for dizziness, tremors, speech change, focal weakness, seizures, weakness and headaches.  Endo/Heme/Allergies: Negative.  Does not bruise/bleed easily.  Psychiatric/Behavioral: Negative.  Negative for depression, hallucinations and suicidal ideas.    Tolerating Diet: yes      DRUG ALLERGIES:  No Known Allergies  VITALS:  Blood pressure 124/79, pulse (!) 119, temperature 97.6 F (36.4 C), temperature source Oral, resp. rate 20, height '5\' 11"'$  (1.803 m), weight 84.6 kg (186 lb 6.4 oz), SpO2 90 %.  PHYSICAL EXAMINATION:   Physical Exam  Constitutional: He is oriented to person, place, and time and well-developed, well-nourished, and in no distress. No distress.  HENT:  Head: Normocephalic.  Eyes: No scleral icterus.  Neck: Normal range of motion. Neck supple. No JVD present. No tracheal deviation present.  Cardiovascular: Normal rate, regular rhythm and normal heart sounds.  Exam reveals no gallop and no friction rub.   No murmur  heard. Pulmonary/Chest: Effort normal and breath sounds normal. No respiratory distress. He has no wheezes. He has no rales. He exhibits no tenderness.  Abdominal: Soft. Bowel sounds are normal. He exhibits no distension and no mass. There is no tenderness. There is no rebound and no guarding.  Musculoskeletal: Normal range of motion. He exhibits no edema.  Neurological: He is alert and oriented to person, place, and time.  Skin: Skin is warm. No rash noted. No erythema.  Psychiatric: Affect and judgment normal.      LABORATORY PANEL:   CBC  Recent Labs Lab 04/07/16 0604  WBC 13.5*  HGB 14.2  HCT 41.4  PLT 288   ------------------------------------------------------------------------------------------------------------------  Chemistries   Recent Labs Lab 04/06/16 1900 04/07/16 0604  NA  --  138  K  --  4.9  CL  --  106  CO2  --  27  GLUCOSE  --  159*  BUN  --  18  CREATININE  --  1.01  CALCIUM  --  8.2*  MG 2.0  --    ------------------------------------------------------------------------------------------------------------------  Cardiac Enzymes  Recent Labs Lab 04/06/16 2346 04/07/16 0604 04/07/16 1239  TROPONINI <0.03 <0.03 <0.03   ------------------------------------------------------------------------------------------------------------------  RADIOLOGY:  No results found.   ASSESSMENT AND PLAN:   60 year old male with a history of COPD and tobacco dependence who presents with community-acquired pneumonia and COPD exacerbation.  1. Acute hypoxic respiratory failure in the setting of community acquired pneumonia and COPD exacerbation. Wean oxygen as tolerated.  2. Community acquired pneumonia: Continue Levaquin  3. Sepsis present on admission with leukocytosis and tachycardia: This is due to community-acquired pneumonia. Sepsis is resolving.  4. COPD exacerbation:Patient  with improved wheezing . Due to sinus tachycardia we'll change to  Xopenex rather than albuterol. Continue IV steroids with oral taper planned for tomorrow.  Patient will likely need oxygen at discharge.   5. Tobacco dependence: Patient is highly encouraged assessment. Patient counseled for 3 minutes regarding quitting smoking. He is agreeable for nicotine patch.  6. Hyperlipidemia: Continue simvastatin.  7. BPH: Continue Flomax  8. Essential hypertension: Continue enalapril and restart diltiazem.  9. Opiate abuse: Patient is currently on MS Contin and oxycodone when necessary. Will not escalate narcotics or prescribe at discharge Management plans discussed with the patient and he is in agreement.  CODE STATUS: full  TOTAL TIME TAKING CARE OF THIS PATIENT: 25 minutes.    Rounded with nurse today. POSSIBLE D/C 2 days, DEPENDING ON CLINICAL CONDITION.   Stanley Rios M.D on 04/09/2016 at 11:02 AM  Between 7am to 6pm - Pager - [phone] After 6pm go to [email or web address] - password [email or web address] [facility]  Office  [phone]  CC: Primary care physician; Marygrace Drought, MD  Note: This dictation was prepared with Dragon dictation along with smaller phrase technology. Any transcriptional errors that result from this process are unintentional.

## 2016-04-09 NOTE — Progress Notes (Signed)
O2 at 6 liters on exertion back to 2 L at rest. Order CT to evaluate for underlying pulmonary emboli or worsening of pneumonia.

## 2016-04-09 NOTE — Evaluation (Signed)
Physical Therapy Evaluation Patient Details Name: Stanley Rios. MRN: [id number] DOB: [birth year] Today's Date: 04/09/2016   History of Present Illness  Pt is a 60 y/o male presenting with flank pain, wheezing, coughing, and SOB. Pt admitted with community aquired pneumonia. PMH includes COPD, chronic neck pain, chronic back pain, anxiety, depression, HTN, and opiate abuse.  Clinical Impression  Pt is semi-supine in bed upon PT arrival with no signs of SOB though O2 sats 84% on 1L O2 via nasal canula. RN consulted throughout PT session to change O2 levels to keep O2 sats >87% (pt required 2 L of O2 at rest and 6 L during activity; see below for details). Pt at baseline is modified independent with mobility using a SPC for short distances and a scooter for longer distances. Pt lives at home with wife who is available to provide assistance as needed. Pt has a ramp entrance into his mobile home. Pt demonstrates generalized weakness in B LE's during exercises, though able to transfer and ambulate without knee buckling. Pt demonstrates SOB throughout session and increased fatigue with more activity. Pt is modified independent with bed mobility and supervision with transfers and ambulation using a RW; assist with O2 equipment; min guard provided throughout session for safety only. Pt HR elevated to 130 BPM after ambulating though returned to baseline low 120s at end of session. Pt will benefit from continued PT in order to improve these impairments. RN and care manager notified of mobility status and O2 requirements throughout session.     Follow Up Recommendations Home health PT;Supervision for mobility/OOB (supervision for O2 tubing/equipment management)    Equipment Recommendations  Rolling walker with 5" wheels    Recommendations for Other Services       Precautions / Restrictions Precautions Precautions: None Restrictions Weight Bearing Restrictions: No      Mobility  Bed  Mobility Overal bed mobility: Modified Independent             General bed mobility comments: Pt requires increased time to complete; pt SOB and O2 desats to upper 80s on 2L of O2 with this activity and pt cued to purse-lip breathe and rest break until O2 was 90%; no reports of lightheadedness/dizziness  Transfers Overall transfer level: Needs assistance Equipment used: Rolling walker (2 wheeled) Transfers: Sit to/from Stand (bed to chair) Sit to Stand: Supervision         General transfer comment: Pt required assistance with placement of O2 and tubing; pt required mod cuing first trial and min cuing second trial to position self and complete transfer safely; pt O2 desats to mid to upper 80s on 2L of O2 with bed to chair transfer. RN consulted and pt O2 raised to 3 L prior to initial ambulation and O2 returned to 91%.  Ambulation/Gait Ambulation/Gait assistance: Supervision Ambulation Distance (Feet): 55 Feet (1= 20' (stand rest), 2=10' (stand rest), 3=55') Assistive device: Rolling walker (2 wheeled) Gait Pattern/deviations: Step-through pattern;Trunk flexed Gait velocity: decreased   General Gait Details: pt cued and continued purse lip breathing throughout amb; O2 monitored with standing rest breaks; after first 20' pt desats to 86% on 3L of O2. RN consulted and O2 raised to 4 L during standing rest break with O2 sats staying at 87-88% and RN requesting to raise O2 to 5 L (for ambulation) and was placed on 6 L due to no 5L option on portable tank; pt O2 sats maintained at 88% for final 65' including one standing rest break;  pt  HR went to 130 BPM after activity and returned to low 120s at end of session (nursing aware).   Stairs            Wheelchair Mobility    Modified Rankin (Stroke Patients Only)       Balance Overall balance assessment: Modified Independent (Sitting balance EOB good; standing balance with bil UE support on RW good)                                            Pertinent Vitals/Pain Pain Assessment: 0-10 Pain Score: 8  Pain Location: L side of trunk/back Pain Descriptors / Indicators: Constant;Sore (Pt reports that morning pain meds helped "a little") Pain Intervention(s): Limited activity within patient's tolerance;Monitored during session;Premedicated before session;Patient requesting pain meds-RN notified (RN in room with pain meds and for pts request for a morphine injection)    Home Living Family/patient expects to be discharged to:: Private residence Living Arrangements: Spouse/significant other Available Help at Discharge: Family Type of Home: Mobile home Home Access: Ramped entrance (with rails)     Home Layout: One [place] - single point;Walker - standard      Prior Function Level of Independence: Independent with assistive device(s)         Comments: ambulated in home with SPC or furniture cruising; pt uses a "cart"/scooter for ambulating longer distances and in stores in the community.      Hand Dominance   Dominant Hand: Left    Extremity/Trunk Assessment   Upper Extremity Assessment: Overall WFL for tasks assessed           Lower Extremity Assessment: Generalized weakness (Pt requires increased time and minor difficulty completing supine LE exercises; pt able to transfer and ambulate without knee buckling; pt at least 3-/5)         Communication   Communication: No difficulties  Cognition Arousal/Alertness: Awake/alert Behavior During Therapy: WFL for tasks assessed/performed Overall Cognitive Status: Within Functional Limits for tasks assessed                      General Comments General comments (skin integrity, edema, etc.): Pt agreeable to PT session.  At beginning of session pt on 1 L of O2. Prior to any activity pt O2 sats 84%. RN consulted and pt O2 raised to 2 L.     Exercises General Exercises - Lower Extremity Ankle Circles/Pumps:  AROM;Strengthening;Both;10 reps;Supine Short Arc Quad: AROM;Both;10 reps;Supine (Rest break prior with purse lip breathing education/exercises to increase O2 sats) Heel Slides: AROM;Both;10 reps;Supine (First rep of L heel slide increased L side pain; improved with each rep)      Assessment/Plan    PT Assessment Patient needs continued PT services  PT Diagnosis Generalized weakness;Other (comment) (decreased energy conservation)   PT Problem List Decreased strength;Decreased activity tolerance;Decreased mobility;Decreased knowledge of use of DME;Cardiopulmonary status limiting activity;Pain  PT Treatment Interventions DME instruction;Therapeutic activities;Therapeutic exercise;Patient/family education;Other (comment) (energy conservation techniques)   PT Goals (Current goals can be found in the Care Plan section) Acute Rehab PT Goals Patient Stated Goal: To have decreased pain and to go home.  PT Goal Formulation: With patient Time For Goal Achievement: 04/23/16 Potential to Achieve Goals: Fair    Frequency Min 2X/week   Barriers to discharge   Care management notified of pt O2 requirements with PT and recommendations for  home PT and a RW.     Co-evaluation               End of Session Equipment Utilized During Treatment: Gait belt;Oxygen Activity Tolerance: Other (comment) (Pt limited by SOB and decreased O2 sats throughout session and increased fatigue.) Patient left: in chair;with call bell/phone within reach;with chair alarm set;with nursing/sitter in room Nurse Communication: Mobility status;Patient requests pain meds;Other (comment) (O2 changes/HR changes)         Time: 5726-2035 PT Time Calculation (min) (ACUTE ONLY): 52 min   Charges:         PT G Codes:        Gevorg Brum, SPT 04/09/2016, 1:24 PM

## 2016-04-09 NOTE — Discharge Summary (Addendum)
[facility] [place] NAME: Stanley Rios    MR#:  [id number]  DATE OF BIRTH:  06/08/1956  DATE OF ADMISSION:  04/06/2016 ADMITTING PHYSICIAN: Harvie Bridge, DO  DATE OF DISCHARGE: 04/18/2016  PRIMARY CARE PHYSICIAN: Marygrace Drought, MD    ADMISSION DIAGNOSIS:  Flank pain [R10.9] Aspiration pneumonia of left lung, unspecified aspiration pneumonia type, unspecified part of lung (English) [J69.0]  DISCHARGE DIAGNOSIS:  Active Problems:   Community acquired pneumonia   Pneumonia  SECONDARY DIAGNOSIS:   Past Medical History:  Diagnosis Date  . Anxiety   . Chronic back pain   . Chronic neck pain   . COPD (chronic obstructive pulmonary disease) ([place])   . Depression   . Hyperlipidemia   . Hypertension   . Opiate abuse, continuous     HOSPITAL COURSE:   60 year old male with a history of COPD and tobacco dependence who presents with community-acquired pneumonia and COPD exacerbation.  1. Acute hypoxic respiratory failure in the setting of community acquired pneumonia and COPD exacerbation. Resolved now.  2. Community acquired pneumonia: treated  3. Sepsis present on admission with leukocytosis and tachycardia: due to community-acquired pneumonia. Sepsis has resolved.  4. COPD exacerbation: resolved  5. Tobacco dependence: Patient is highly encouraged assessment. Patient counseled for 3 minutes regarding quitting smoking.  6. Hyperlipidemia: Continue simvastatin.  7. BPH: Continue Flomax  8. Essential hypertension: Continue enalapril and diltiazem. Added metoprolol  9. Opiate abuse: off now.  DISCHARGE CONDITIONS AND DIET:   Stable regular  CONSULTS OBTAINED:  Treatment Team:  Bettey Costa, MD  DRUG ALLERGIES:  No Known Allergies  DISCHARGE MEDICATIONS:   Current Discharge Medication List    START taking these medications   Details  diltiazem (CARDIZEM SR) 90 MG 12 hr capsule Take 1 capsule (90 mg total)  by mouth every 12 (twelve) hours. Qty: 60 capsule, Refills: 0    levofloxacin (LEVAQUIN) 750 MG tablet Take 1 tablet (750 mg total) by mouth daily. Qty: 5 tablet, Refills: 0      CONTINUE these medications which have CHANGED   Details  predniSONE (DELTASONE) 10 MG tablet Take 1 tablet (10 mg total) by mouth daily with breakfast. 60 mg PO (ORAL) x 2 days 50 mg PO (ORAL)  x 2 days 40 mg PO (ORAL)  x 2 days 30 mg PO  (ORAL)  x 2 days 20 mg PO  (ORAL) x 2 days 10 mg PO  (ORAL) x 2 days then stop Qty: 20 tablet, Refills: 0      CONTINUE these medications which have NOT CHANGED   Details  albuterol (PROVENTIL HFA;VENTOLIN HFA) 108 (90 Base) MCG/ACT inhaler Inhale 2 puffs into the lungs every 4 (four) hours as needed for wheezing or shortness of breath. Reported on 10/26/2015    baclofen (LIORESAL) 10 MG tablet Take 10 mg by mouth 2 (two) times daily as needed for muscle spasms.     buPROPion (WELLBUTRIN) 75 MG tablet Take 75 mg by mouth 2 (two) times daily. Reported on 10/26/2015    celecoxib (CELEBREX) 200 MG capsule Take 200 mg by mouth daily.     cyclobenzaprine (FLEXERIL) 10 MG tablet Take 10 mg by mouth 3 (three) times daily as needed for muscle spasms.    diclofenac sodium (VOLTAREN) 1 % GEL Apply 4 g topically 4 (four) times daily as needed (for pain).     DULoxetine (CYMBALTA) 60 MG capsule Take 60 mg by mouth daily. Reported on 10/26/2015  enalapril (VASOTEC) 5 MG tablet Take 5 mg by mouth daily.    finasteride (PROSCAR) 5 MG tablet Take 5 mg by mouth daily.    Fluticasone-Salmeterol (ADVAIR) 250-50 MCG/DOSE AEPB Inhale 1 puff into the lungs 2 (two) times daily. Reported on 10/26/2015    gabapentin (NEURONTIN) 400 MG capsule Take 800 mg by mouth 3 (three) times daily.    lidocaine (XYLOCAINE) 5 % ointment Apply 1 application topically See admin instructions. 2 to 4 times daily    omeprazole (PRILOSEC) 40 MG capsule Take 40 mg by mouth 2 (two) times daily.     polyethylene glycol (MIRALAX / GLYCOLAX) packet Take 17 g by mouth daily.    simvastatin (ZOCOR) 20 MG tablet Take 20 mg by mouth at bedtime.     tamsulosin (FLOMAX) 0.4 MG CAPS capsule Take 0.4 mg by mouth daily.    tiotropium (SPIRIVA) 18 MCG inhalation capsule Place 18 mcg into inhaler and inhale daily. Reported on 10/26/2015    traZODone (DESYREL) 50 MG tablet Take 150 mg by mouth at bedtime.    naloxone (NARCAN) 2 MG/2ML injection Inject 2 mLs into the muscle as needed. For side effect/adverse reaction/opioid overdose      STOP taking these medications     traMADol (ULTRAM) 50 MG tablet         Today   CHIEF COMPLAINT:  Doing bettter this am less SOB and no wheezing VITAL SIGNS:  Blood pressure 124/79, pulse (!) 119, temperature 97.6 F (36.4 C), temperature source Oral, resp. rate 20, height '5\' 11"'$  (1.803 m), weight 84.6 kg (186 lb 6.4 oz), SpO2 90 %.   REVIEW OF SYSTEMS:  Review of Systems  Constitutional: Negative.  Negative for chills, fever and malaise/fatigue.  HENT: Negative.  Negative for ear discharge, ear pain, hearing loss, nosebleeds and sore throat.   Eyes: Negative.  Negative for blurred vision and pain.  Respiratory: Positive for cough. Negative for hemoptysis, shortness of breath and wheezing.   Cardiovascular: Negative.  Negative for chest pain, palpitations and leg swelling.  Gastrointestinal: Negative.  Negative for abdominal pain, blood in stool, diarrhea, nausea and vomiting.  Genitourinary: Negative.  Negative for dysuria.  Musculoskeletal: Negative.  Negative for back pain.  Skin: Negative.   Neurological: Negative for dizziness, tremors, speech change, focal weakness, seizures and headaches.  Endo/Heme/Allergies: Negative.  Does not bruise/bleed easily.  Psychiatric/Behavioral: Negative.  Negative for depression, hallucinations and suicidal ideas.     PHYSICAL EXAMINATION:  GENERAL:  60 y.o.-year-old patient lying in the bed with no acute  distress.  NECK:  Supple, no jugular venous distention. No thyroid enlargement, no tenderness.  LUNGS: Normal breath sounds bilaterally, no wheezing, rales,rhonchi  No use of accessory muscles of respiration.  CARDIOVASCULAR: S1, S2 normal. No murmurs, rubs, or gallops.  ABDOMEN: Soft, non-tender, non-distended. Bowel sounds present. No organomegaly or mass.  EXTREMITIES: No pedal edema, cyanosis, or clubbing.  PSYCHIATRIC: The patient is alert and oriented x 3.  SKIN: No obvious rash, lesion, or ulcer.  DATA REVIEW:   CBC  Recent Labs Lab 04/07/16 0604  WBC 13.5*  HGB 14.2  HCT 41.4  PLT 288    Chemistries   Recent Labs Lab 04/06/16 1900 04/07/16 0604  NA  --  138  K  --  4.9  CL  --  106  CO2  --  27  GLUCOSE  --  159*  BUN  --  18  CREATININE  --  1.01  CALCIUM  --  8.2*  MG 2.0  --     Follow-up Information    Marygrace Drought, MD. Schedule an appointment as soon as possible for a visit in 1 week(s).   Specialty:  Family Medicine Contact information: [address] [place] [address] [phone]           Management plans discussed with the patient and he is in agreement. Stable for discharge home Rounded with nursing Patient should follow up with pcp in 1 week  CODE STATUS: FULL CODE  TOTAL TIME TAKING CARE OF THIS PATIENT: 35 minutes.    Note: This dictation was prepared with Dragon dictation along with smaller phrase technology. Any transcriptional errors that result from this process are unintentional.  MODY, SITAL M.D on 04/09/2016 at 11:03 AM  Between 7am to 6pm - Pager - [phone] After 6pm go to [email or web address] - [facility]  Office  [phone]  CC: Primary care physician; Marygrace Drought, MD

## 2016-04-09 NOTE — Progress Notes (Signed)
Ok to work with PT to day  pt reprots HR baseline 120s

## 2016-04-10 DIAGNOSIS — J9601 Acute respiratory failure with hypoxia: Secondary | ICD-10-CM | POA: Diagnosis not present

## 2016-04-10 DIAGNOSIS — J189 Pneumonia, unspecified organism: Secondary | ICD-10-CM | POA: Diagnosis not present

## 2016-04-10 DIAGNOSIS — J449 Chronic obstructive pulmonary disease, unspecified: Secondary | ICD-10-CM | POA: Diagnosis not present

## 2016-04-10 DIAGNOSIS — A419 Sepsis, unspecified organism: Secondary | ICD-10-CM | POA: Diagnosis not present

## 2016-04-10 LAB — CBC
HEMATOCRIT: 40.4 % (ref 40.0–52.0)
HEMATOCRIT: 40.3 % (ref 40.0–52.0)
HEMOGLOBIN: 13.5 g/dL (ref 13.0–18.0)
HEMOGLOBIN: 13.4 g/dL (ref 13.0–18.0)
MCH: 32.3 pg (ref 26.0–34.0)
MCH: 32.1 pg (ref 26.0–34.0)
MCHC: 33.4 g/dL (ref 32.0–36.0)
MCHC: 33.3 g/dL (ref 32.0–36.0)
MCV: 96.6 fL (ref 80.0–100.0)
MCV: 96.5 fL (ref 80.0–100.0)
Platelets: 286 10*3/uL (ref 150–440)
Platelets: 291 10*3/uL (ref 150–440)
RBC: 4.18 MIL/uL — AB (ref 4.40–5.90)
RBC: 4.18 MIL/uL — AB (ref 4.40–5.90)
RDW: 15.7 % — ABNORMAL HIGH (ref 11.5–14.5)
RDW: 15.6 % — ABNORMAL HIGH (ref 11.5–14.5)
WBC: 26.2 10*3/uL — ABNORMAL HIGH (ref 3.8–10.6)
WBC: 26.3 10*3/uL — AB (ref 3.8–10.6)

## 2016-04-10 LAB — BASIC METABOLIC PANEL
ANION GAP: 4 — AB (ref 5–15)
BUN: 30 mg/dL — ABNORMAL HIGH (ref 6–20)
CALCIUM: 8.8 mg/dL — AB (ref 8.9–10.3)
CO2: 31 mmol/L (ref 22–32)
Chloride: 105 mmol/L (ref 101–111)
Creatinine, Ser: 0.94 mg/dL (ref 0.61–1.24)
Glucose, Bld: 127 mg/dL — ABNORMAL HIGH (ref 65–99)
POTASSIUM: 4.4 mmol/L (ref 3.5–5.1)
Sodium: 140 mmol/L (ref 135–145)

## 2016-04-10 LAB — RAPID HIV SCREEN (HIV 1/2 AB+AG)
HIV 1/2 Antibodies: NONREACTIVE
HIV-1 P24 ANTIGEN - HIV24: NONREACTIVE

## 2016-04-10 LAB — MRSA PCR SCREENING: MRSA by PCR: NEGATIVE

## 2016-04-10 MED ORDER — VANCOMYCIN HCL 10 G IV SOLR
1250.0000 mg | Freq: Once | INTRAVENOUS | Status: AC
  Administered 2016-04-10: 1250 mg via INTRAVENOUS
  Filled 2016-04-10: qty 1250

## 2016-04-10 MED ORDER — NICOTINE 14 MG/24HR TD PT24
14.0000 mg | MEDICATED_PATCH | Freq: Every day | TRANSDERMAL | Status: DC
  Administered 2016-04-10 – 2016-04-18 (×9): 14 mg via TRANSDERMAL
  Filled 2016-04-10 (×9): qty 1

## 2016-04-10 MED ORDER — PIPERACILLIN-TAZOBACTAM 4.5 G IVPB
4.5000 g | Freq: Three times a day (TID) | INTRAVENOUS | Status: DC
  Administered 2016-04-10 – 2016-04-11 (×3): 4.5 g via INTRAVENOUS
  Filled 2016-04-10 (×5): qty 100

## 2016-04-10 MED ORDER — PIPERACILLIN-TAZOBACTAM 3.375 G IVPB: 3.3750 g | Freq: Three times a day (TID) | INTRAVENOUS | Status: DC

## 2016-04-10 MED ORDER — VANCOMYCIN HCL 10 G IV SOLR
1250.0000 mg | Freq: Two times a day (BID) | INTRAVENOUS | Status: DC
  Administered 2016-04-10 – 2016-04-11 (×2): 1250 mg via INTRAVENOUS
  Filled 2016-04-10 (×3): qty 1250

## 2016-04-10 MED ORDER — METHYLPREDNISOLONE SODIUM SUCC 40 MG IJ SOLR
40.0000 mg | Freq: Three times a day (TID) | INTRAMUSCULAR | Status: DC
  Administered 2016-04-10 – 2016-04-13 (×9): 40 mg via INTRAVENOUS
  Filled 2016-04-10 (×9): qty 1

## 2016-04-10 NOTE — Progress Notes (Signed)
Pharmacy Antibiotic Note  Stanley Rios. is a 60 y.o. male admitted on 04/06/2016 with pneumonia(?CAP).  Pharmacy has been consulted for Zosyn/Vancomycin dosing on Day 5 of admission.  Patient was on Azithromycin outpatient. Received Levaquin 9/10-9/14.  Plan: Vancomycin 1250 IV every 12 hours.  Goal trough 15-20 mcg/mL. Will give stacked dosing. Will check trough prior to 5 th dose on 9/16.  Zosyn 4.5 gm IV q8h EI. Will cover for possible HAP since Day 5 of admit and patient worsened. Monitor for De-escalation of dose.     Height: '5\' 11"'$  (180.3 cm) Weight: 186 lb 6.4 oz (84.6 kg) IBW/kg (Calculated) : 75.3  Temp (24hrs), Avg:97.9 F (36.6 C), Min:97.6 F (36.4 C), Max:98.2 F (36.8 C)   Recent Labs Lab 04/06/16 1504 04/06/16 1647 04/07/16 0604 04/10/16 0421  WBC 17.7*  --  13.5* 26.2*  CREATININE 0.95  --  1.01 0.94  LATICACIDVEN  --  0.9  --   --     Estimated Creatinine Clearance: 89 mL/min (by C-G formula based on SCr of 0.94 mg/dL).    No Known Allergies  Antimicrobials this admission: Levaquin 9/10 >> 9/14 Vanc  9/14 >>   Zosyn 9/14 >>  Dose adjustments this admission:   Microbiology results: 9/10 BCx: neg.   UCx:      Sputum:    9/14 MRSA PCR: pending  Thank you for allowing pharmacy to be a part of this patient's care.  Augusto Deckman A 04/10/2016 11:52 AM

## 2016-04-10 NOTE — Progress Notes (Signed)
[facility] [place] NAME: Stanley Rios    MR#:  [id number]  DATE OF BIRTH:  1955/08/30  SUBJECTIVE:   Patient With increasing shortness of breath and wheezing this morning. Patient on nonrebreather this morning.  REVIEW OF SYSTEMS:    Review of Systems  Constitutional: Positive for malaise/fatigue. Negative for chills and fever.  HENT: Negative.  Negative for ear discharge, ear pain, hearing loss, nosebleeds and sore throat.   Eyes: Negative.  Negative for blurred vision and pain.  Respiratory: Positive for cough, sputum production, shortness of breath and wheezing. Negative for hemoptysis.   Cardiovascular: Negative.  Negative for chest pain, palpitations and leg swelling.  Gastrointestinal: Negative.  Negative for abdominal pain, blood in stool, diarrhea, nausea and vomiting.  Genitourinary: Negative.  Negative for dysuria.  Musculoskeletal: Positive for back pain.  Skin: Negative.   Neurological: Positive for weakness. Negative for dizziness, tremors, speech change, focal weakness, seizures and headaches.  Endo/Heme/Allergies: Negative.  Does not bruise/bleed easily.  Psychiatric/Behavioral: Negative.  Negative for depression, hallucinations and suicidal ideas.    Tolerating Diet: yes      DRUG ALLERGIES:  No Known Allergies  VITALS:  Blood pressure (!) 167/99, pulse (!) 132, temperature 97.6 F (36.4 C), temperature source Oral, resp. rate 20, height '5\' 11"'$  (1.803 m), weight 84.6 kg (186 lb 6.4 oz), SpO2 93 %.  PHYSICAL EXAMINATION:   Physical Exam  Constitutional: He is oriented to person, place, and time and well-developed, well-nourished, and in no distress. No distress.  HENT:  Head: Normocephalic.  Placed on nonrebreather  Eyes: No scleral icterus.  Neck: Normal range of motion. Neck supple. No JVD present. No tracheal deviation present.  Cardiovascular: Normal rate, regular rhythm and normal heart sounds.  Exam  reveals no gallop and no friction rub.   No murmur heard. Pulmonary/Chest: Effort normal. No respiratory distress. He has wheezes. He has no rales. He exhibits no tenderness.  Abdominal: Soft. Bowel sounds are normal. He exhibits no distension and no mass. There is no tenderness. There is no rebound and no guarding.  Musculoskeletal: Normal range of motion. He exhibits no edema.  Neurological: He is alert and oriented to person, place, and time.  Skin: Skin is warm. No rash noted. He is not diaphoretic. No erythema.  Psychiatric: Affect and judgment normal.      LABORATORY PANEL:   CBC  Recent Labs Lab 04/10/16 0421  WBC 26.2*  HGB 13.5  HCT 40.4  PLT 286   ------------------------------------------------------------------------------------------------------------------  Chemistries   Recent Labs Lab 04/06/16 1900  04/10/16 0421  NA  --   < > 140  K  --   < > 4.4  CL  --   < > 105  CO2  --   < > 31  GLUCOSE  --   < > 127*  BUN  --   < > 30*  CREATININE  --   < > 0.94  CALCIUM  --   < > 8.8*  MG 2.0  --   --   < > = values in this interval not displayed. ------------------------------------------------------------------------------------------------------------------  Cardiac Enzymes  Recent Labs Lab 04/06/16 2346 04/07/16 0604 04/07/16 1239  TROPONINI <0.03 <0.03 <0.03   ------------------------------------------------------------------------------------------------------------------  RADIOLOGY:  Ct Angio Chest Pe W Or Wo Contrast  Result Date: 04/09/2016 CLINICAL DATA:  Hypoxia. EXAM: CT ANGIOGRAPHY CHEST WITH CONTRAST TECHNIQUE: Multidetector CT imaging of the chest was performed using the standard protocol during bolus  administration of intravenous contrast. Multiplanar CT image reconstructions and MIPs were obtained to evaluate the vascular anatomy. CONTRAST:  75 mL Isovue 370 COMPARISON:  Two-view chest x-ray 04/06/2016.  CTA chest 02/09/2016 FINDINGS:  Cardiovascular: The heart size is normal. Mild coronary artery calcifications are again seen. There is no effusion. Atherosclerotic changes are noted at the origins the great vessels without significant stenosis at the level of the arch. Mediastinum/Nodes: No significant mediastinal adenopathy is present. There is no significant axillary adenopathy. Pulmonary arterial opacification is excellent. There are no focal filling defects to suggest pulmonary embolus. Lungs/Pleura: There is partial collapse of the right middle lobe. Left upper lobe airspace disease and bronchial thickening is concerning for atypical and infection. There is some posterior airspace opacity in the right upper lobe is well. Atelectasis with partial collapse is noted in the lower lobes bilaterally. Upper Abdomen: A posterior medial diaphragmatic hernia is present on the right. Limited imaging of the upper abdomen is otherwise unremarkable. Musculoskeletal: Bone windows demonstrate typical endplate degenerative change. No focal lytic or blastic lesions are present. Review of the MIP images confirms the above findings. IMPRESSION: 1. No pulmonary embolus. 2. Left greater than right upper lobe airspace opacities compatible with pneumonia. 3. Partial collapse of the right middle lobe. 4. Bilateral lower lobe atelectasis. 5. Coronary artery disease. 6. Atherosclerotic changes at the aortic arch. Electronically Signed   By: San Morelle M.D.   On: 04/09/2016 12:42     ASSESSMENT AND PLAN:   60 year old male with a history of COPD and tobacco dependence who presents with bilateral community-acquired pneumonia and COPD exacerbation.  1. Acute hypoxic respiratory failure in the setting of community acquired pneumonia and COPD exacerbation. CT scan shows bilateral pneumonia with atelectasis and partial right middle lobe collapse due to atelectasis. Pulmonary consult due to worsening of hypoxic respiratory failure. Continue ISS. Increase  steroids to 40 IV every 8. Change Levaquin to Zosyn and vancomycin MRSA PCR ordered. Blood cultures negative to date. Sputum culture.  2. Bilateral lobe Community acquired pneumonia: Plan as outlined above.  3. Sepsis present on admission with leukocytosis and tachycardia: This is due to community-acquired pneumonia.   4. COPD exacerbation: Patient symptoms worsening today. Increase IV steroids.  Due to sinus tachycardia changed to Xopenex rather than albuterol.     5. Tobacco dependence: Patient is highly encouraged assessment. Patient counseled for 3 minutes regarding quitting smoking. He is agreeable for nicotine patch.  6. Hyperlipidemia: Continue simvastatin.  7. BPH: Continue Flomax  8. Essential hypertension: Continue enalapril and restart diltiazem.  9. Opiate abuse: Patient is currently on MS Contin and oxycodone when necessary. Will not escalate narcotics or prescribe at discharge   Management plans discussed with the patient and he is in agreement.  CODE STATUS: full  TOTAL TIME TAKING CARE OF THIS PATIENT: 33 minutes.    Rounded with nurse today. POSSIBLE D/C 3- 4 days, DEPENDING ON CLINICAL CONDITION.   Moosa Bueche M.D on 04/10/2016 at 11:23 AM  Between 7am to 6pm - Pager - [phone] After 6pm go to [email or web address] - password [email or web address] [facility]  Office  [phone]  CC: Primary care physician; Marygrace Drought, MD  Note: This dictation was prepared with Dragon dictation along with smaller phrase technology. Any transcriptional errors that result from this process are unintentional.

## 2016-04-10 NOTE — Care Management (Signed)
Anticipate that patient will require home health, RW, and O2 at home.  Patient has a standard walker at home, but will require a RW.  These orders have already been placed.  Patient was offered choice of home health agency and [facility] was selected.  Corene Cornea with [facility] given referral.  However patient is now requiring HFNC.  Patient is also requesting home nebulizer.  RNCM following for discharge planning.

## 2016-04-10 NOTE — Progress Notes (Signed)
Pt. has large section of bruising on left flank in area of reported pain. RN made aware. Pt. continues to report intense pain (9/10) one after after PO oxycodone '5mg'$ . RN notified, gave IV morphine '2mg'$ /mL.

## 2016-04-11 ENCOUNTER — Inpatient Hospital Stay: Payer: PPO

## 2016-04-11 ENCOUNTER — Encounter: Payer: Self-pay | Admitting: Radiology

## 2016-04-11 DIAGNOSIS — K4090 Unilateral inguinal hernia, without obstruction or gangrene, not specified as recurrent: Secondary | ICD-10-CM | POA: Diagnosis not present

## 2016-04-11 DIAGNOSIS — A419 Sepsis, unspecified organism: Secondary | ICD-10-CM | POA: Diagnosis not present

## 2016-04-11 DIAGNOSIS — J9601 Acute respiratory failure with hypoxia: Secondary | ICD-10-CM | POA: Diagnosis not present

## 2016-04-11 DIAGNOSIS — J449 Chronic obstructive pulmonary disease, unspecified: Secondary | ICD-10-CM | POA: Diagnosis not present

## 2016-04-11 DIAGNOSIS — J189 Pneumonia, unspecified organism: Secondary | ICD-10-CM | POA: Diagnosis not present

## 2016-04-11 LAB — CBC
HCT: 39.6 % — ABNORMAL LOW (ref 40.0–52.0)
Hemoglobin: 13.2 g/dL (ref 13.0–18.0)
MCH: 32 pg (ref 26.0–34.0)
MCHC: 33.3 g/dL (ref 32.0–36.0)
MCV: 96.1 fL (ref 80.0–100.0)
Platelets: 295 10*3/uL (ref 150–440)
RBC: 4.12 MIL/uL — ABNORMAL LOW (ref 4.40–5.90)
RDW: 15.9 % — AB (ref 11.5–14.5)
WBC: 29.5 10*3/uL — AB (ref 3.8–10.6)

## 2016-04-11 LAB — BASIC METABOLIC PANEL
ANION GAP: 6 (ref 5–15)
BUN: 32 mg/dL — ABNORMAL HIGH (ref 6–20)
CALCIUM: 8.3 mg/dL — AB (ref 8.9–10.3)
CHLORIDE: 102 mmol/L (ref 101–111)
CO2: 31 mmol/L (ref 22–32)
CREATININE: 1 mg/dL (ref 0.61–1.24)
GFR calc non Af Amer: >60 mL/min (ref >60)
GLUCOSE: 264 mg/dL — AB (ref 65–99)
Potassium: 4.5 mmol/L (ref 3.5–5.1)
Sodium: 139 mmol/L (ref 135–145)

## 2016-04-11 LAB — GLUCOSE, CAPILLARY
GLUCOSE-CAPILLARY: 117 mg/dL — AB (ref 65–99)
GLUCOSE-CAPILLARY: 226 mg/dL — AB (ref 65–99)
Glucose-Capillary: 225 mg/dL — ABNORMAL HIGH (ref 65–99)

## 2016-04-11 LAB — PROTIME-INR
INR: 0.83
PROTHROMBIN TIME: 11.4 s (ref 11.4–15.2)

## 2016-04-11 LAB — CULTURE, BLOOD (ROUTINE X 2)
CULTURE: NO GROWTH
Culture: NO GROWTH

## 2016-04-11 MED ORDER — INSULIN ASPART 100 UNIT/ML ~~LOC~~ SOLN
0.0000 [IU] | Freq: Every day | SUBCUTANEOUS | Status: DC
  Administered 2016-04-11 – 2016-04-12 (×2): 2 [IU] via SUBCUTANEOUS
  Filled 2016-04-11 (×3): qty 2

## 2016-04-11 MED ORDER — INSULIN ASPART 100 UNIT/ML ~~LOC~~ SOLN
0.0000 [IU] | Freq: Three times a day (TID) | SUBCUTANEOUS | Status: DC
  Administered 2016-04-11: 3 [IU] via SUBCUTANEOUS
  Administered 2016-04-12: 7 [IU] via SUBCUTANEOUS
  Administered 2016-04-12: 2 [IU] via SUBCUTANEOUS
  Administered 2016-04-12: 1 [IU] via SUBCUTANEOUS
  Administered 2016-04-13: 3 [IU] via SUBCUTANEOUS
  Administered 2016-04-13: 5 [IU] via SUBCUTANEOUS
  Administered 2016-04-13: 2 [IU] via SUBCUTANEOUS
  Administered 2016-04-14: 3 [IU] via SUBCUTANEOUS
  Administered 2016-04-14 – 2016-04-15 (×3): 1 [IU] via SUBCUTANEOUS
  Administered 2016-04-15: 3 [IU] via SUBCUTANEOUS
  Administered 2016-04-15: 1 [IU] via SUBCUTANEOUS
  Administered 2016-04-16: 8 [IU] via SUBCUTANEOUS
  Administered 2016-04-16: 1 [IU] via SUBCUTANEOUS
  Administered 2016-04-17: 3 [IU] via SUBCUTANEOUS
  Administered 2016-04-17: 2 [IU] via SUBCUTANEOUS
  Administered 2016-04-18: 1 [IU] via SUBCUTANEOUS
  Filled 2016-04-11: qty 1
  Filled 2016-04-11 (×2): qty 5
  Filled 2016-04-11 (×2): qty 1
  Filled 2016-04-11: qty 2
  Filled 2016-04-11: qty 1
  Filled 2016-04-11: qty 5
  Filled 2016-04-11: qty 3
  Filled 2016-04-11: qty 7
  Filled 2016-04-11: qty 1
  Filled 2016-04-11 (×2): qty 3
  Filled 2016-04-11: qty 2
  Filled 2016-04-11: qty 1
  Filled 2016-04-11: qty 2
  Filled 2016-04-11 (×2): qty 1
  Filled 2016-04-11: qty 3

## 2016-04-11 MED ORDER — PIPERACILLIN-TAZOBACTAM 3.375 G IVPB
3.3750 g | Freq: Three times a day (TID) | INTRAVENOUS | Status: DC
  Administered 2016-04-11 – 2016-04-16 (×16): 3.375 g via INTRAVENOUS
  Filled 2016-04-11 (×16): qty 50

## 2016-04-11 MED ORDER — SENNOSIDES-DOCUSATE SODIUM 8.6-50 MG PO TABS
1.0000 | ORAL_TABLET | Freq: Two times a day (BID) | ORAL | Status: DC
  Administered 2016-04-11 – 2016-04-18 (×14): 1 via ORAL
  Filled 2016-04-11 (×14): qty 1

## 2016-04-11 MED ORDER — IOPAMIDOL (ISOVUE-300) INJECTION 61%
100.0000 mL | Freq: Once | INTRAVENOUS | Status: AC | PRN
  Administered 2016-04-11: 100 mL via INTRAVENOUS

## 2016-04-11 NOTE — Progress Notes (Signed)
Pharmacy Antibiotic Note  Stanley Rios. is a 60 y.o. male admitted on 04/06/2016 with pneumonia(CAP).  Pharmacy has been consulted for Zosyn/Vancomycin dosing on Day 5 of admission.  Patient was on Azithromycin outpatient. Received Levaquin 9/10-9/14.  Plan: Vancomycin 1250 IV every 12 hours.  Goal trough 15-20 mcg/mL. Will give stacked dosing. Will check trough prior to 5 th dose on 9/16.  Zosyn 4.5 gm IV q8h EI. Will cover for possible HAP since Day 5 of admit and patient worsened. Monitor for De-escalation of dose.  9/15 MRSA PCR negative. Per discussion with Dr. Lavetta Nielsen, will d/c Vancomycin. MD suspects this is still PNA from beginning of admission, not HAP. Will change Zosyn to 3.375gm IV q8h EI.    Height: '5\' 11"'$  (180.3 cm) Weight: 186 lb 6.4 oz (84.6 kg) IBW/kg (Calculated) : 75.3  Temp (24hrs), Avg:97.8 F (36.6 C), Min:97.5 F (36.4 C), Max:98.4 F (36.9 C)   Recent Labs Lab 04/06/16 1504 04/06/16 1647 04/07/16 0604 04/10/16 0421 04/10/16 1303 04/11/16 0425 04/11/16 1158  WBC 17.7*  --  13.5* 26.2* 26.3*  --  29.5*  CREATININE 0.95  --  1.01 0.94  --  1.00  --   LATICACIDVEN  --  0.9  --   --   --   --   --     Estimated Creatinine Clearance: 83.7 mL/min (by C-G formula based on SCr of 1 mg/dL).    No Known Allergies  Antimicrobials this admission: Levaquin 9/10 >> 9/14 Vanc  9/14 >>  9/15 Zosyn 9/14 >>  Dose adjustments this admission:   Microbiology results: 9/10 BCx: neg.   UCx:      Sputum:    9/14 MRSA DIX:[id number]  Thank you for allowing pharmacy to be a part of this patient's care.  Farren Nelles A 04/11/2016 1:21 PM

## 2016-04-11 NOTE — Progress Notes (Signed)
Upon assessment, RN found pt to have retractable breathing on his left lobe, exactly where his left flank bruising is. Pt is alert X4, states he does not have any chest pain or in any respiratory distress. Pt states his pain is a nagging pain when he coughs. Pt is still on HFNC, O2 is at 96. Doctor Hower was notified of RN findings, stated he will place orders and come see pt. Will monitor pt very closely.   Angus Seller

## 2016-04-11 NOTE — Progress Notes (Signed)
Inpatient Diabetes Program Recommendations  AACE/ADA: New Consensus Statement on Inpatient Glycemic Control (2015)  Target Ranges:  Prepandial:   less than 140 mg/dL      Peak postprandial:   less than 180 mg/dL (1-2 hours)      Critically ill patients:  140 - 180 mg/dL   Results for Stanley Rios, Stanley Rios (MRN [id number]) as of 04/11/2016 11:30  Ref. Range 04/10/2016 04:21 04/11/2016 04:25  Glucose Latest Ref Range: 65 - 99 mg/dL 127 (H) 264 (H)   Review of Glycemic Control  Diabetes history: No Outpatient Diabetes medications: NA Current orders for Inpatient glycemic control: none  Inpatient Diabetes Program Recommendations: Correction (SSI): Lab glucose 264 mg/dl this morning. Patient is ordered Solumedrol 40 mg Q8H which is likely cause of hyperglycemia. While inpatient and ordered steroids, please consider ordering CBGs with Novolog correction scale ACHS.  Thanks, Barnie Alderman, RN, MSN, CDE Diabetes Coordinator Inpatient Diabetes Program [phone] (Team Pager from [place] to [place]) [phone] ([facility] office) [phone] [facility] office) [phone] [facility] office)

## 2016-04-11 NOTE — Progress Notes (Signed)
PT Cancellation Note  Patient Details Name: Acheron Sugg. MRN: [id number] DOB: Jun 28, 1956   Cancelled Treatment:    Reason Eval/Treat Not Completed: Other (comment) (Pt on hold after consulting with RN Lauren; pt presenting with retractable breathing and increased O2 demands. PT will return at a later date/time for treatment as able. )   Riley Nearing, SPT 04/11/2016, 10:29 AM

## 2016-04-11 NOTE — Progress Notes (Signed)
[facility] [place] NAME: Stanley Rios    MR#:  [id number]  DATE OF BIRTH:  July 23, 1956  SUBJECTIVE:   Respiratory status stable Complains of left side pain over the bruised area-mid axillary line/flank  REVIEW OF SYSTEMS:    Review of Systems  Constitutional: Positive for malaise/fatigue. Negative for chills and fever.  HENT: Negative.  Negative for ear discharge, ear pain, hearing loss, nosebleeds and sore throat.   Eyes: Negative.  Negative for blurred vision and pain.  Respiratory: Positive for cough, sputum production, shortness of breath and wheezing. Negative for hemoptysis.   Cardiovascular: Negative.  Negative for chest pain, palpitations and leg swelling.  Gastrointestinal: Negative.  Negative for abdominal pain, blood in stool, diarrhea, nausea and vomiting.  Genitourinary: Negative.  Negative for dysuria.  Musculoskeletal: Positive for back pain.  Skin: Negative.   Neurological: Positive for weakness. Negative for dizziness, tremors, speech change, focal weakness, seizures and headaches.  Endo/Heme/Allergies: Negative.  Does not bruise/bleed easily.  Psychiatric/Behavioral: Negative.  Negative for depression, hallucinations and suicidal ideas.    Tolerating Diet: yes      DRUG ALLERGIES:  No Known Allergies  VITALS:  Blood pressure 111/80, pulse (!) 120, temperature 97.5 F (36.4 C), temperature source Oral, resp. rate 20, height '5\' 11"'$  (1.803 m), weight 84.6 kg (186 lb 6.4 oz), SpO2 95 %.  PHYSICAL EXAMINATION:   Physical Exam  Constitutional: He is oriented to person, place, and time and well-developed, well-nourished, and in no distress. No distress.  HENT:  Head: Normocephalic.  Placed on nonrebreather  Eyes: No scleral icterus.  Neck: Normal range of motion. Neck supple. No JVD present. No tracheal deviation present.  Cardiovascular: Normal rate, regular rhythm and normal heart sounds.  Exam reveals no  gallop and no friction rub.   No murmur heard. Pulmonary/Chest: Effort normal. No respiratory distress. He has no wheezes. He has rhonchi. He has no rales. He exhibits no tenderness.  Right lower lobe coarse rhonchi  Abdominal: Soft. Bowel sounds are normal. He exhibits no distension and no mass. There is no tenderness. There is no rebound and no guarding.  Musculoskeletal: Normal range of motion. He exhibits no edema.  Neurological: He is alert and oriented to person, place, and time.  Skin: Skin is warm. No rash noted. He is not diaphoretic. No erythema.     Large area of ecchymosis left flank  Psychiatric: Affect and judgment normal.      LABORATORY PANEL:   CBC  Recent Labs Lab 04/10/16 1303  WBC 26.3*  HGB 13.4  HCT 40.3  PLT 291   ------------------------------------------------------------------------------------------------------------------  Chemistries   Recent Labs Lab 04/06/16 1900  04/11/16 0425  NA  --   < > 139  K  --   < > 4.5  CL  --   < > 102  CO2  --   < > 31  GLUCOSE  --   < > 264*  BUN  --   < > 32*  CREATININE  --   < > 1.00  CALCIUM  --   < > 8.3*  MG 2.0  --   --   < > = values in this interval not displayed. ------------------------------------------------------------------------------------------------------------------  Cardiac Enzymes  Recent Labs Lab 04/06/16 2346 04/07/16 0604 04/07/16 1239  TROPONINI <0.03 <0.03 <0.03   ------------------------------------------------------------------------------------------------------------------  RADIOLOGY:  Ct Angio Chest Pe W Or Wo Contrast  Result Date: 04/09/2016 CLINICAL DATA:  Hypoxia. EXAM: CT ANGIOGRAPHY  CHEST WITH CONTRAST TECHNIQUE: Multidetector CT imaging of the chest was performed using the standard protocol during bolus administration of intravenous contrast. Multiplanar CT image reconstructions and MIPs were obtained to evaluate the vascular anatomy. CONTRAST:  75 mL  Isovue 370 COMPARISON:  Two-view chest x-ray 04/06/2016.  CTA chest 02/09/2016 FINDINGS: Cardiovascular: The heart size is normal. Mild coronary artery calcifications are again seen. There is no effusion. Atherosclerotic changes are noted at the origins the great vessels without significant stenosis at the level of the arch. Mediastinum/Nodes: No significant mediastinal adenopathy is present. There is no significant axillary adenopathy. Pulmonary arterial opacification is excellent. There are no focal filling defects to suggest pulmonary embolus. Lungs/Pleura: There is partial collapse of the right middle lobe. Left upper lobe airspace disease and bronchial thickening is concerning for atypical and infection. There is some posterior airspace opacity in the right upper lobe is well. Atelectasis with partial collapse is noted in the lower lobes bilaterally. Upper Abdomen: A posterior medial diaphragmatic hernia is present on the right. Limited imaging of the upper abdomen is otherwise unremarkable. Musculoskeletal: Bone windows demonstrate typical endplate degenerative change. No focal lytic or blastic lesions are present. Review of the MIP images confirms the above findings. IMPRESSION: 1. No pulmonary embolus. 2. Left greater than right upper lobe airspace opacities compatible with pneumonia. 3. Partial collapse of the right middle lobe. 4. Bilateral lower lobe atelectasis. 5. Coronary artery disease. 6. Atherosclerotic changes at the aortic arch. Electronically Signed   By: San Morelle M.D.   On: 04/09/2016 12:42   Ct Abdomen Pelvis W Contrast  Result Date: 04/11/2016 CLINICAL DATA:  Follow up retroperitoneal hematoma. Previous cholecystectomy. EXAM: CT ABDOMEN AND PELVIS WITH CONTRAST TECHNIQUE: Multidetector CT imaging of the abdomen and pelvis was performed using the standard protocol following bolus administration of intravenous contrast. CONTRAST:  118m ISOVUE-300 IOPAMIDOL (ISOVUE-300) INJECTION  61% COMPARISON:  01/16/2016 and 04/06/2016 CTs. Chest radiographs 04/11/2016. FINDINGS: Lower chest: Right middle lobe opacity is partially imaged, similar to today's chest radiographs and likely due to atelectasis. There is mild atelectasis in both lower lobes and mild central airway thickening. No confluent airspace opacity, pleural or pericardial effusion. There is stable Bochdalek hernia on the right containing only fat. There is stable splaying of the left ninth and tenth ribs laterally with associated herniation of the left lung base and upper abdominal contents. Hepatobiliary: The liver is normal in density without focal abnormality. Stable mild extrahepatic biliary prominence status post cholecystectomy. Pancreas: Unremarkable. No pancreatic ductal dilatation or surrounding inflammatory changes. Spleen: Normal in size without focal abnormality. Adrenals/Urinary Tract: Both adrenal glands appear normal. There are stable low-density renal lesions bilaterally consistent with cysts. There is a small amount of the anterior, low-density perinephric fluid on the left (images 32-34 of series 2). This measures 13 mm maximally. No evidence of renal mass, urinary tract calculus or hydronephrosis. The bladder is mildly distended without focal abnormality. Stomach/Bowel: No evidence of bowel wall thickening, distention or surrounding inflammatory change. Moderate stool throughout the colon. There are mild diverticular changes of the sigmoid colon. Vascular/Lymphatic: There are no enlarged abdominal or pelvic lymph nodes. Aortic and branch vessel atherosclerosis. 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 Aside from the small amount of perinephric fluid on the left, no other retroperitoneal fluid collections are seen. Reproductive: Stable umbilical hernia containing only fat. There is stable asymmetric fat in the left inguinal canal. No ascites. Other: No evidence of abdominal wall mass or hernia.  Musculoskeletal: No  acute or significant osseous findings. Mild lumbar spondylosis. As above, stable splaying of the left ninth and tenth ribs laterally. IMPRESSION: 1. Small, low-density perinephric fluid collection on the left. This could reflect a resolving hematoma or sequela of prior infection or ureteral obstruction. No evidence of renal abscess or ureteral obstruction. 2. No other acute findings demonstrated. 3. 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 Aortic Atherosclerosis (ICD10-170.0) 4. Stable right Bochdalek, umbilical, left lateral thoracic and left inguinal hernias. Electronically Signed   By: Richardean Sale M.D.   On: 04/11/2016 11:59   Dg Chest Port 1 View  Result Date: 04/11/2016 CLINICAL DATA:  Admitted 04-06-16 with PNA, having worsening breathing since yesterday, low stats. Smoker. EXAM: PORTABLE CHEST 1 VIEW COMPARISON:  04/09/2016, CT 04/09/2016 FINDINGS: Normal mediastinum and cardiac silhouette. RIGHT infrahilar density corresponds atelectasis in the RIGHT middle lobe on comparison radiograph Normal pulmonary vasculature. No evidence of effusion, infiltrate, or pneumothorax. No acute bony abnormality. Anterior cervical fusion IMPRESSION: No acute cardiopulmonary process. RIGHT middle lobe atelectasis noted. Electronically Signed   By: Suzy Bouchard M.D.   On: 04/11/2016 09:22     ASSESSMENT AND PLAN:   60 year old male with a history of COPD and tobacco dependence who presents with bilateral community-acquired pneumonia and COPD exacerbation.  1. Acute hypoxic respiratory failure in the setting of community acquired pneumonia and COPD exacerbation. CT scan shows bilateral pneumonia with atelectasis and partial right middle lobe collapse due to atelectasis. Pulmonary consult due to worsening of hypoxic respiratory failure. Continue ISS. Continue steroids to 40 IV every 8. Zosyn and vancomycin MRSA PCR  ordered. Blood cultures negative to date. Sputum culture.  Respiratory status likely worsen given splinting on the left side secondary to pain  2. Bilateral lobe Community acquired pneumonia: Plan as outlined above.  3. Sepsis present on admission with leukocytosis and tachycardia: This is due to community-acquired pneumonia.   4. COPD exacerbation: Patient symptoms worsening today. Increase IV steroids.  Due to sinus tachycardia changed to Xopenex rather than albuterol.     5. Tobacco dependence: Patient is highly encouraged assessment. Patient counseled for 3 minutes regarding quitting smoking. He is agreeable for nicotine patch.  6. Hyperlipidemia: Continue simvastatin.  7. BPH: Continue Flomax  8. Essential hypertension: Continue enalapril and restart diltiazem.  9. Opiate abuse: Patient is currently on MS Contin and oxycodone when necessary. Will not escalate narcotics or prescribe at discharge  10. Large area of ecchymosis: Check CT abdomen pelvis for retroperitoneal hematoma, check CBC for hemoglobin, check INR   Management plans discussed with the patient and he is in agreement.  CODE STATUS: full  TOTAL TIME TAKING CARE OF THIS PATIENT: 33 minutes.    Rounded with nurse today. POSSIBLE D/C 3- 4 days, DEPENDING ON CLINICAL CONDITION.   Hower,  Karenann Cai.D on 04/11/2016 at 12:03 PM  Between 7am to 6pm - Pager - [phone] After 6pm go to [email or web address] - password [email or web address] [facility]  Office  [phone]  CC: Primary care physician; Marygrace Drought, MD  Note: This dictation was prepared with Dragon dictation along with smaller phrase technology. Any transcriptional errors that result from this process are unintentional.

## 2016-04-12 DIAGNOSIS — J9601 Acute respiratory failure with hypoxia: Secondary | ICD-10-CM | POA: Diagnosis not present

## 2016-04-12 DIAGNOSIS — J449 Chronic obstructive pulmonary disease, unspecified: Secondary | ICD-10-CM | POA: Diagnosis not present

## 2016-04-12 DIAGNOSIS — J189 Pneumonia, unspecified organism: Secondary | ICD-10-CM | POA: Diagnosis not present

## 2016-04-12 DIAGNOSIS — A419 Sepsis, unspecified organism: Secondary | ICD-10-CM | POA: Diagnosis not present

## 2016-04-12 LAB — BASIC METABOLIC PANEL
Anion gap: 3 — ABNORMAL LOW (ref 5–15)
BUN: 30 mg/dL — AB (ref 6–20)
CHLORIDE: 103 mmol/L (ref 101–111)
CO2: 32 mmol/L (ref 22–32)
CREATININE: 0.81 mg/dL (ref 0.61–1.24)
Calcium: 8.2 mg/dL — ABNORMAL LOW (ref 8.9–10.3)
GFR calc Af Amer: >60 mL/min (ref >60)
GLUCOSE: 152 mg/dL — AB (ref 65–99)
Potassium: 4.9 mmol/L (ref 3.5–5.1)
SODIUM: 138 mmol/L (ref 135–145)

## 2016-04-12 LAB — GLUCOSE, CAPILLARY
GLUCOSE-CAPILLARY: 158 mg/dL — AB (ref 65–99)
GLUCOSE-CAPILLARY: 236 mg/dL — AB (ref 65–99)
Glucose-Capillary: 314 mg/dL — ABNORMAL HIGH (ref 65–99)
Glucose-Capillary: 123 mg/dL — ABNORMAL HIGH (ref 65–99)

## 2016-04-12 LAB — CBC
HCT: 36.3 % — ABNORMAL LOW (ref 40.0–52.0)
Hemoglobin: 12.5 g/dL — ABNORMAL LOW (ref 13.0–18.0)
MCH: 33.1 pg (ref 26.0–34.0)
MCHC: 34.5 g/dL (ref 32.0–36.0)
MCV: 95.8 fL (ref 80.0–100.0)
PLATELETS: 279 10*3/uL (ref 150–440)
RBC: 3.79 MIL/uL — ABNORMAL LOW (ref 4.40–5.90)
RDW: 15.9 % — ABNORMAL HIGH (ref 11.5–14.5)
WBC: 24.8 10*3/uL — ABNORMAL HIGH (ref 3.8–10.6)

## 2016-04-12 MED ORDER — SODIUM CHLORIDE 0.9 % IV SOLN
INTRAVENOUS | Status: DC
  Administered 2016-04-12 – 2016-04-16 (×8): via INTRAVENOUS

## 2016-04-12 MED ORDER — METOPROLOL TARTRATE 5 MG/5ML IV SOLN
2.5000 mg | Freq: Once | INTRAVENOUS | Status: AC
  Administered 2016-04-12: 2.5 mg via INTRAVENOUS
  Filled 2016-04-12: qty 5

## 2016-04-12 NOTE — Progress Notes (Signed)
Pt.'s son, Thurmond Butts, would like to be notified of any changes in pt.'s condition and the future plans for discharge. The best number to contact him at is [phone].  Angus Seller

## 2016-04-12 NOTE — Progress Notes (Addendum)
Notified MD of patient's heart rate sustaining 120's-140s at 2220. MD advised to give scheduled cardizem and continue to monitor patient. Will give Cardizem and continue to monitor closely.

## 2016-04-12 NOTE — Progress Notes (Signed)
Date: 04/12/2016,   MRN# [id number] Stanley Rios. 04/26/1956 Code Status:     Code Status Orders        Start     Ordered   04/06/16 2133  Full code  Continuous     04/06/16 2132    Code Status History    Date Active Date Inactive Code Status Order ID Comments User Context   02/09/2016 11:47 PM 02/10/2016  4:18 PM Full Code [id number]  Lance Coon, MD Inpatient   10/26/2015  6:09 AM 10/27/2015  6:19 PM Full Code [id number]  Saundra Shelling, MD ED     Hosp day:'@LENGTHOFSTAYDAYS'$ @ Referring MD: '@ATDPROV'$ @            CC: respiratory failure  HPI: pt seen full note to follow  PMHX:   Past Medical History:  Diagnosis Date  . Anxiety   . Chronic back pain   . Chronic neck pain   . COPD (chronic obstructive pulmonary disease) ([place])   . Depression   . Hyperlipidemia   . Hypertension   . Opiate abuse, continuous    Surgical Hx:  Past Surgical History:  Procedure Laterality Date  . CHOLECYSTECTOMY    . NECK SURGERY  2002 approx   fusion, about 12 years ago  . SPINAL FUSION     ?? C3- C4   Family Hx:  Family History  Problem Relation Age of Onset  . Bladder Cancer Father     deceased   Social Hx:   Social History  Substance Use Topics  . Smoking status: Current Every Day Smoker    Packs/day: 0.50    Types: Cigarettes  . Smokeless tobacco: Never Used  . Alcohol use No   Medication:    Home Medication:  Current Outpatient Rx  . Order #: [id number] Class: Normal  . Order #: [id number] Class: Normal  . Order #: [id number] Class: Print    Current Medication: '@CURMEDTAB'$ @   Allergies:  Review of patient's allergies indicates no known allergies.  Review of Systems: Gen:  Denies  fever, sweats, chills HEENT: Denies blurred vision, double vision, ear pain, eye pain, hearing loss, nose bleeds, sore throat Cvc:  No dizziness, chest pain or heaviness Resp:    Gi: Denies swallowing difficulty, stomach pain, nausea or vomiting, diarrhea, constipation, bowel  incontinence Gu:  Denies bladder incontinence, burning urine Ext:   No Joint pain, stiffness or swelling Skin: No skin rash, easy bruising or bleeding or hives Endoc:  No polyuria, polydipsia , polyphagia or weight change Psych: No depression, insomnia or hallucinations  Other:  All other systems negative  Physical Examination:   VS: BP 108/81 (BP Location: Left Arm)   Pulse (!) 125   Temp 98.2 F (36.8 C) (Oral)   Resp (!) 22   Ht '5\' 11"'$  (1.803 m)   Wt 186 lb 6.4 oz (84.6 kg)   SpO2 97%   BMI 26.00 kg/m   General Appearance: No distress  Neuro: without focal findings, mental status, speech normal, alert and oriented, cranial nerves 2-12 intact, reflexes normal and symmetric, sensation grossly normal  HEENT: PERRLA, EOM intact, no ptosis, no other lesions noticed, Mallampati: Pulmonary:.No wheezing, No rales  Sputum Production:   Cardiovascular:  Normal S1,S2.  No m/r/g.  Abdominal aorta pulsation normal.    Abdomen:Benign, Soft, non-tender, No masses, hepatosplenomegaly, No lymphadenopathy Endoc: No evident thyromegaly, no signs of acromegaly or Cushing features Skin:   warm, no rashes, no ecchymosis  Extremities: normal, no cyanosis, clubbing, no edema, warm with  normal capillary refill. Other findings:   Labs results:   Recent Labs     04/10/16  0421  04/10/16  1303  04/11/16  0425  04/11/16  1158  04/12/16  0609  HGB  13.5  13.4   --   13.2  12.5*  HCT  40.4  40.3   --   39.6*  36.3*  MCV  96.6  96.5   --   96.1  95.8  WBC  26.2*  26.3*   --   29.5*  24.8*  BUN  30*   --   32*   --   30*  CREATININE  0.94   --   1.00   --   0.81  GLUCOSE  127*   --   264*   --   152*  CALCIUM  8.8*   --   8.3*   --   8.2*  INR   --    --    --   0.83   --   ,    No results for input(s): PH in the last 72 hours.  Invalid input(s): PCO2, PO2, BASEEXCESS, BASEDEFICITE, TFT  Culture results:     Rad results:   Ct Abdomen Pelvis W Contrast  Result Date: 04/11/2016 CLINICAL  DATA:  Follow up retroperitoneal hematoma. Previous cholecystectomy. EXAM: CT ABDOMEN AND PELVIS WITH CONTRAST TECHNIQUE: Multidetector CT imaging of the abdomen and pelvis was performed using the standard protocol following bolus administration of intravenous contrast. CONTRAST:  11m ISOVUE-300 IOPAMIDOL (ISOVUE-300) INJECTION 61% COMPARISON:  01/16/2016 and 04/06/2016 CTs. Chest radiographs 04/11/2016. FINDINGS: Lower chest: Right middle lobe opacity is partially imaged, similar to today's chest radiographs and likely due to atelectasis. There is mild atelectasis in both lower lobes and mild central airway thickening. No confluent airspace opacity, pleural or pericardial effusion. There is stable Bochdalek hernia on the right containing only fat. There is stable splaying of the left ninth and tenth ribs laterally with associated herniation of the left lung base and upper abdominal contents. Hepatobiliary: The liver is normal in density without focal abnormality. Stable mild extrahepatic biliary prominence status post cholecystectomy. Pancreas: Unremarkable. No pancreatic ductal dilatation or surrounding inflammatory changes. Spleen: Normal in size without focal abnormality. Adrenals/Urinary Tract: Both adrenal glands appear normal. There are stable low-density renal lesions bilaterally consistent with cysts. There is a small amount of the anterior, low-density perinephric fluid on the left (images 32-34 of series 2). This measures 13 mm maximally. No evidence of renal mass, urinary tract calculus or hydronephrosis. The bladder is mildly distended without focal abnormality. Stomach/Bowel: No evidence of bowel wall thickening, distention or surrounding inflammatory change. Moderate stool throughout the colon. There are mild diverticular changes of the sigmoid colon. Vascular/Lymphatic: There are no enlarged abdominal or pelvic lymph nodes. Aortic and branch vessel atherosclerosis. Chronic occlusion of the right  common iliac artery. Aside from the small amount of perinephric fluid on the left, no other retroperitoneal fluid collections are seen. Reproductive: Stable umbilical hernia containing only fat. There is stable asymmetric fat in the left inguinal canal. No ascites. Other: No evidence of abdominal wall mass or hernia. Musculoskeletal: No acute or significant osseous findings. Mild lumbar spondylosis. As above, stable splaying of the left ninth and tenth ribs laterally. IMPRESSION: 1. Small, low-density perinephric fluid collection on the left. This could reflect a resolving hematoma or sequela of prior infection or ureteral obstruction. No evidence of renal abscess or ureteral obstruction. 2. No other acute findings demonstrated.  3. 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 Aortic Atherosclerosis (ICD10-170.0) 4. Stable right Bochdalek, umbilical, left lateral thoracic and left inguinal hernias. Electronically Signed   By: Richardean Sale M.D.   On: 04/11/2016 11:59      EKG:     Other:   Assessment and Plan:    I have personally obtained a history, examined the patient, evaluated laboratory and imaging results, formulated the assessment and plan and placed orders.  The Patient requires high complexity decision making for assessment and support, frequent evaluation and titration of therapies, application of advanced monitoring technologies and extensive interpretation of multiple databases.   Yalonda Sample,M.D. Pulmonary & Critical care Medicine [facility]

## 2016-04-12 NOTE — Progress Notes (Signed)
Notified MD of HR sustaining in 120s-130s. MD ordered NS at 75/hr and IV lopressor. Will give lopressor, start fluids, and continue to monitor patient closely.

## 2016-04-12 NOTE — Progress Notes (Signed)
Spoke to Dr. Benjie Karvonen about patient have a heart rate that was staying in the 120s. Respiratory is going to attempt to wean off high flow nasal canula but was concerned HR may increase. Dr. Benjie Karvonen did not want to make any changes at this time and RN will continue to monitor.

## 2016-04-12 NOTE — Progress Notes (Signed)
[facility] [place] NAME: Stanley Rios    MR#:  [id number]  DATE OF BIRTH:  09/25/1955  SUBJECTIVE:   Patient with pain over bruised area. Reports shortness of breath is improved. No wheezing overnight.  REVIEW OF SYSTEMS:    Review of Systems  Constitutional: Positive for malaise/fatigue. Negative for chills and fever.  HENT: Negative.  Negative for ear discharge, ear pain, hearing loss, nosebleeds and sore throat.   Eyes: Negative.  Negative for blurred vision and pain.  Respiratory: Positive for cough and sputum production. Negative for hemoptysis. Shortness of breath: improved.   Cardiovascular: Negative.  Negative for chest pain, palpitations and leg swelling.  Gastrointestinal: Negative.  Negative for abdominal pain, blood in stool, diarrhea, nausea and vomiting.  Genitourinary: Negative.  Negative for dysuria.  Musculoskeletal: Positive for back pain.  Skin:       Bruising left back  Neurological: Positive for weakness. Negative for dizziness, tremors, speech change, focal weakness, seizures and headaches.  Endo/Heme/Allergies: Negative.  Does not bruise/bleed easily.  Psychiatric/Behavioral: Negative.  Negative for depression, hallucinations and suicidal ideas.    Tolerating Diet: yes      DRUG ALLERGIES:  No Known Allergies  VITALS:  Blood pressure 106/83, pulse (!) 119, temperature 98.2 F (36.8 C), temperature source Oral, resp. rate (!) 22, height '5\' 11"'$  (1.803 m), weight 84.6 kg (186 lb 6.4 oz), SpO2 97 %.  PHYSICAL EXAMINATION:   Physical Exam  Constitutional: He is oriented to person, place, and time and well-developed, well-nourished, and in no distress. No distress.  HENT:  Head: Normocephalic.  Placed on hfnc  Eyes: No scleral icterus.  Neck: Normal range of motion. Neck supple. No JVD present. No tracheal deviation present.  Cardiovascular: Normal rate, regular rhythm and normal heart sounds.  Exam  reveals no gallop and no friction rub.   No murmur heard. Pulmonary/Chest: Effort normal. No respiratory distress. He has no wheezes. He has rhonchi. He has no rales. He exhibits no tenderness.  Mild rhonchii  Abdominal: Soft. Bowel sounds are normal. He exhibits no distension and no mass. There is no tenderness. There is no rebound and no guarding.  Musculoskeletal: Normal range of motion. He exhibits no edema.  Neurological: He is alert and oriented to person, place, and time.  Skin: Skin is warm. No rash noted. He is not diaphoretic. No erythema.     Large area of ecchymosis left flank  Psychiatric: Affect and judgment normal.      LABORATORY PANEL:   CBC  Recent Labs Lab 04/12/16 0609  WBC 24.8*  HGB 12.5*  HCT 36.3*  PLT 279   ------------------------------------------------------------------------------------------------------------------  Chemistries   Recent Labs Lab 04/06/16 1900  04/12/16 0609  NA  --   < > 138  K  --   < > 4.9  CL  --   < > 103  CO2  --   < > 32  GLUCOSE  --   < > 152*  BUN  --   < > 30*  CREATININE  --   < > 0.81  CALCIUM  --   < > 8.2*  MG 2.0  --   --   < > = values in this interval not displayed. ------------------------------------------------------------------------------------------------------------------  Cardiac Enzymes  Recent Labs Lab 04/06/16 2346 04/07/16 0604 04/07/16 1239  TROPONINI <0.03 <0.03 <0.03   ------------------------------------------------------------------------------------------------------------------  RADIOLOGY:  Ct Abdomen Pelvis W Contrast  Result Date: 04/11/2016 CLINICAL DATA:  Follow  up retroperitoneal hematoma. Previous cholecystectomy. EXAM: CT ABDOMEN AND PELVIS WITH CONTRAST TECHNIQUE: Multidetector CT imaging of the abdomen and pelvis was performed using the standard protocol following bolus administration of intravenous contrast. CONTRAST:  132m ISOVUE-300 IOPAMIDOL (ISOVUE-300)  INJECTION 61% COMPARISON:  01/16/2016 and 04/06/2016 CTs. Chest radiographs 04/11/2016. FINDINGS: Lower chest: Right middle lobe opacity is partially imaged, similar to today's chest radiographs and likely due to atelectasis. There is mild atelectasis in both lower lobes and mild central airway thickening. No confluent airspace opacity, pleural or pericardial effusion. There is stable Bochdalek hernia on the right containing only fat. There is stable splaying of the left ninth and tenth ribs laterally with associated herniation of the left lung base and upper abdominal contents. Hepatobiliary: The liver is normal in density without focal abnormality. Stable mild extrahepatic biliary prominence status post cholecystectomy. Pancreas: Unremarkable. No pancreatic ductal dilatation or surrounding inflammatory changes. Spleen: Normal in size without focal abnormality. Adrenals/Urinary Tract: Both adrenal glands appear normal. There are stable low-density renal lesions bilaterally consistent with cysts. There is a small amount of the anterior, low-density perinephric fluid on the left (images 32-34 of series 2). This measures 13 mm maximally. No evidence of renal mass, urinary tract calculus or hydronephrosis. The bladder is mildly distended without focal abnormality. Stomach/Bowel: No evidence of bowel wall thickening, distention or surrounding inflammatory change. Moderate stool throughout the colon. There are mild diverticular changes of the sigmoid colon. Vascular/Lymphatic: There are no enlarged abdominal or pelvic lymph nodes. Aortic and branch vessel atherosclerosis. 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 Aside from the small amount of perinephric fluid on the left, no other retroperitoneal fluid collections are seen. Reproductive: Stable umbilical hernia containing only fat. There is stable asymmetric fat in the left inguinal canal. No ascites. Other: No evidence of abdominal wall mass or hernia.  Musculoskeletal: No acute or significant osseous findings. Mild lumbar spondylosis. As above, stable splaying of the left ninth and tenth ribs laterally. IMPRESSION: 1. Small, low-density perinephric fluid collection on the left. This could reflect a resolving hematoma or sequela of prior infection or ureteral obstruction. No evidence of renal abscess or ureteral obstruction. 2. No other acute findings demonstrated. 3. 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 Aortic Atherosclerosis (ICD10-170.0) 4. Stable right Bochdalek, umbilical, left lateral thoracic and left inguinal hernias. Electronically Signed   By: WRichardean SaleM.D.   On: 04/11/2016 11:59   Dg Chest Port 1 View  Result Date: 04/11/2016 CLINICAL DATA:  Admitted 04-06-16 with PNA, having worsening breathing since yesterday, low stats. Smoker. EXAM: PORTABLE CHEST 1 VIEW COMPARISON:  04/09/2016, CT 04/09/2016 FINDINGS: Normal mediastinum and cardiac silhouette. RIGHT infrahilar density corresponds atelectasis in the RIGHT middle lobe on comparison radiograph Normal pulmonary vasculature. No evidence of effusion, infiltrate, or pneumothorax. No acute bony abnormality. Anterior cervical fusion IMPRESSION: No acute cardiopulmonary process. RIGHT middle lobe atelectasis noted. Electronically Signed   By: SSuzy BouchardM.D.   On: 04/11/2016 09:22     ASSESSMENT AND PLAN:   60year old male with a history of COPD and tobacco dependence who presents with bilateral community-acquired pneumonia and COPD exacerbation.  1. Acute hypoxic respiratory failure in the setting of community acquired pneumonia and COPD exacerbation. CT scan shows bilateral pneumonia with atelectasis and partial right middle lobe collapse due to atelectasis. Pulmonary consulted due to worsening of hypoxic respiratory failure. Awaiting recommendations from pulmonary. Continue ISS. Continue steroids to 40 IV every  8.Plan  to wean tomorrow can Zosyn  MRSA PCR negative and therefore vancomycin discontinued . Blood cultures negative to date.  Respiratory status likely worsen given splinting on the left side secondary to pain  2. Bilateral lobe Community acquired pneumonia: Plan as outlined above.  3. Sepsis present on admission with leukocytosis and tachycardia: This is due to community-acquired pneumonia.   4. COPD exacerbation: Plan to start weaning IV steroids tomorrow if patient remains with improvement in respiratory status  Due to sinus tachycardia changed to Xopenex rather than albuterol.     5. Tobacco dependence: Patient is highly encouraged to stop smoking. 6. Hyperlipidemia: Continue simvastatin.  7. BPH: Continue Flomax  8. Essential hypertension: Continue enalapril and restart diltiazem.  9. Opiate abuse: Patient is currently on MS Contin and oxycodone when necessary. Will not escalate narcotics or prescribe at discharge  10. Large area of ecchymosis: Hemoglobin stable. CT scan does not show retroperitoneal abscess, does show resolving perinephric hematoma. Unclear as to etiology of hematoma.  Management plans discussed with the patient and he is in agreement.  CODE STATUS: full  TOTAL TIME TAKING CARE OF THIS PATIENT: 25 minutes.    Rounded with nurse today. POSSIBLE D/C 3- 4 days, DEPENDING ON CLINICAL CONDITION.   Leeta Grimme M.D on 04/12/2016 at 10:54 AM  Between 7am to 6pm - Pager - [phone] After 6pm go to [email or web address] - password [email or web address] [facility]  Office  [phone]  CC: Primary care physician; Marygrace Drought, MD  Note: This dictation was prepared with Dragon dictation along with smaller phrase technology. Any transcriptional errors that result from this process are unintentional.

## 2016-04-13 ENCOUNTER — Inpatient Hospital Stay: Payer: PPO

## 2016-04-13 DIAGNOSIS — J9601 Acute respiratory failure with hypoxia: Secondary | ICD-10-CM | POA: Diagnosis not present

## 2016-04-13 DIAGNOSIS — A419 Sepsis, unspecified organism: Secondary | ICD-10-CM | POA: Diagnosis not present

## 2016-04-13 DIAGNOSIS — J449 Chronic obstructive pulmonary disease, unspecified: Secondary | ICD-10-CM | POA: Diagnosis not present

## 2016-04-13 DIAGNOSIS — J189 Pneumonia, unspecified organism: Secondary | ICD-10-CM | POA: Diagnosis not present

## 2016-04-13 LAB — CBC
HEMATOCRIT: 37.9 % — AB (ref 40.0–52.0)
HEMOGLOBIN: 12.6 g/dL — AB (ref 13.0–18.0)
MCH: 32.2 pg (ref 26.0–34.0)
MCHC: 33.2 g/dL (ref 32.0–36.0)
MCV: 97 fL (ref 80.0–100.0)
Platelets: 282 10*3/uL (ref 150–440)
RBC: 3.91 MIL/uL — ABNORMAL LOW (ref 4.40–5.90)
RDW: 15.5 % — AB (ref 11.5–14.5)
WBC: 19.7 10*3/uL — AB (ref 3.8–10.6)

## 2016-04-13 LAB — BASIC METABOLIC PANEL
ANION GAP: 4 — AB (ref 5–15)
BUN: 32 mg/dL — ABNORMAL HIGH (ref 6–20)
CHLORIDE: 103 mmol/L (ref 101–111)
CO2: 32 mmol/L (ref 22–32)
Calcium: 8 mg/dL — ABNORMAL LOW (ref 8.9–10.3)
Creatinine, Ser: 0.83 mg/dL (ref 0.61–1.24)
GFR calc non Af Amer: >60 mL/min (ref >60)
GLUCOSE: 219 mg/dL — AB (ref 65–99)
POTASSIUM: 4.7 mmol/L (ref 3.5–5.1)
Sodium: 139 mmol/L (ref 135–145)

## 2016-04-13 LAB — GLUCOSE, CAPILLARY
GLUCOSE-CAPILLARY: 194 mg/dL — AB (ref 65–99)
Glucose-Capillary: 237 mg/dL — ABNORMAL HIGH (ref 65–99)
Glucose-Capillary: 270 mg/dL — ABNORMAL HIGH (ref 65–99)
Glucose-Capillary: 194 mg/dL — ABNORMAL HIGH (ref 65–99)

## 2016-04-13 MED ORDER — METHYLPREDNISOLONE SODIUM SUCC 40 MG IJ SOLR
40.0000 mg | Freq: Every day | INTRAMUSCULAR | Status: DC
  Administered 2016-04-14 – 2016-04-17 (×4): 40 mg via INTRAVENOUS
  Filled 2016-04-13 (×4): qty 1

## 2016-04-13 NOTE — Progress Notes (Signed)
[facility] [place] NAME: Stanley Rios    MR#:  [id number]  DATE OF BIRTH:  18-Jan-1956  SUBJECTIVE:   Reports feeling better this morning. Bowel movement yesterday. Shortness of breath is improved. Cough is improved as well. Left lower back site is also improved pain.  REVIEW OF SYSTEMS:    Review of Systems  Constitutional: Negative for chills, fever and malaise/fatigue.  HENT: Negative.  Negative for ear discharge, ear pain, hearing loss, nosebleeds and sore throat.   Eyes: Negative.  Negative for blurred vision and pain.  Respiratory: Positive for cough. Negative for hemoptysis and sputum production. Shortness of breath: improved.   Cardiovascular: Negative.  Negative for chest pain, palpitations and leg swelling.  Gastrointestinal: Negative.  Negative for abdominal pain, blood in stool, diarrhea, nausea and vomiting.  Genitourinary: Negative.  Negative for dysuria.  Musculoskeletal: Positive for back pain.  Skin:       Bruising left back  Neurological: Positive for weakness. Negative for dizziness, tremors, speech change, focal weakness, seizures and headaches.  Endo/Heme/Allergies: Negative.  Does not bruise/bleed easily.  Psychiatric/Behavioral: Negative.  Negative for depression, hallucinations and suicidal ideas.    Tolerating Diet: yes      DRUG ALLERGIES:  No Known Allergies  VITALS:  Blood pressure 117/79, pulse (!) 115, temperature 98.3 F (36.8 C), temperature source Oral, resp. rate 20, height '5\' 11"'$  (1.803 m), weight 84.6 kg (186 lb 6.4 oz), SpO2 94 %.  PHYSICAL EXAMINATION:   Physical Exam  Constitutional: He is oriented to person, place, and time and well-developed, well-nourished, and in no distress. No distress.  HENT:  Head: Normocephalic.  Placed on hfnc  Eyes: No scleral icterus.  Neck: Normal range of motion. Neck supple. No JVD present. No tracheal deviation present.  Cardiovascular: Normal rate,  regular rhythm and normal heart sounds.  Exam reveals no gallop and no friction rub.   No murmur heard. Pulmonary/Chest: Effort normal. No respiratory distress. He has no wheezes. He has rhonchi. He has no rales. He exhibits no tenderness.  Abdominal: Soft. Bowel sounds are normal. He exhibits distension. He exhibits no mass. There is no tenderness. There is no rebound and no guarding.  Musculoskeletal: Normal range of motion. He exhibits no edema.  Neurological: He is alert and oriented to person, place, and time.  Skin: Skin is warm. No rash noted. He is not diaphoretic. No erythema.     Large area of ecchymosis left flank  Psychiatric: Affect and judgment normal.      LABORATORY PANEL:   CBC  Recent Labs Lab 04/13/16 0522  WBC 19.7*  HGB 12.6*  HCT 37.9*  PLT 282   ------------------------------------------------------------------------------------------------------------------  Chemistries   Recent Labs Lab 04/06/16 1900  04/13/16 0522  NA  --   < > 139  K  --   < > 4.7  CL  --   < > 103  CO2  --   < > 32  GLUCOSE  --   < > 219*  BUN  --   < > 32*  CREATININE  --   < > 0.83  CALCIUM  --   < > 8.0*  MG 2.0  --   --   < > = values in this interval not displayed. ------------------------------------------------------------------------------------------------------------------  Cardiac Enzymes  Recent Labs Lab 04/06/16 2346 04/07/16 0604 04/07/16 1239  TROPONINI <0.03 <0.03 <0.03   ------------------------------------------------------------------------------------------------------------------  RADIOLOGY:  Ct Abdomen Pelvis W Contrast  Result Date:  04/11/2016 CLINICAL DATA:  Follow up retroperitoneal hematoma. Previous cholecystectomy. EXAM: CT ABDOMEN AND PELVIS WITH CONTRAST TECHNIQUE: Multidetector CT imaging of the abdomen and pelvis was performed using the standard protocol following bolus administration of intravenous contrast. CONTRAST:  178m  ISOVUE-300 IOPAMIDOL (ISOVUE-300) INJECTION 61% COMPARISON:  01/16/2016 and 04/06/2016 CTs. Chest radiographs 04/11/2016. FINDINGS: Lower chest: Right middle lobe opacity is partially imaged, similar to today's chest radiographs and likely due to atelectasis. There is mild atelectasis in both lower lobes and mild central airway thickening. No confluent airspace opacity, pleural or pericardial effusion. There is stable Bochdalek hernia on the right containing only fat. There is stable splaying of the left ninth and tenth ribs laterally with associated herniation of the left lung base and upper abdominal contents. Hepatobiliary: The liver is normal in density without focal abnormality. Stable mild extrahepatic biliary prominence status post cholecystectomy. Pancreas: Unremarkable. No pancreatic ductal dilatation or surrounding inflammatory changes. Spleen: Normal in size without focal abnormality. Adrenals/Urinary Tract: Both adrenal glands appear normal. There are stable low-density renal lesions bilaterally consistent with cysts. There is a small amount of the anterior, low-density perinephric fluid on the left (images 32-34 of series 2). This measures 13 mm maximally. No evidence of renal mass, urinary tract calculus or hydronephrosis. The bladder is mildly distended without focal abnormality. Stomach/Bowel: No evidence of bowel wall thickening, distention or surrounding inflammatory change. Moderate stool throughout the colon. There are mild diverticular changes of the sigmoid colon. Vascular/Lymphatic: There are no enlarged abdominal or pelvic lymph nodes. Aortic and branch vessel atherosclerosis. 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 Aside from the small amount of perinephric fluid on the left, no other retroperitoneal fluid collections are seen. Reproductive: Stable umbilical hernia containing only fat. There is stable asymmetric fat in the left inguinal canal. No ascites. Other: No evidence of  abdominal wall mass or hernia. Musculoskeletal: No acute or significant osseous findings. Mild lumbar spondylosis. As above, stable splaying of the left ninth and tenth ribs laterally. IMPRESSION: 1. Small, low-density perinephric fluid collection on the left. This could reflect a resolving hematoma or sequela of prior infection or ureteral obstruction. No evidence of renal abscess or ureteral obstruction. 2. No other acute findings demonstrated. 3. 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 Aortic Atherosclerosis (ICD10-170.0) 4. Stable right Bochdalek, umbilical, left lateral thoracic and left inguinal hernias. Electronically Signed   By: WRichardean SaleM.D.   On: 04/11/2016 11:59     ASSESSMENT AND PLAN:   60year old male with a history of COPD and tobacco dependence who presents with bilateral community-acquired pneumonia and COPD exacerbation.  1. Acute hypoxic respiratory failure in the setting of community acquired pneumonia and COPD exacerbation. CT scan shows bilateral pneumonia with atelectasis and partial right middle lobe collapse due to atelectasis. Pulmonary consulted due to worsening of hypoxic respiratory failure. .[place] oxygen Continue ISS. Wean steroids  Continue Zosyn  MRSA PCR negative and therefore vancomycin discontinued . Blood cultures negative to date. Check chest x-ray Respiratory status likely worsen given splinting on the left side secondary to pain  2. Bilateral lobe Community acquired pneumonia: Plan as outlined above.  3. Sepsis present on admission with leukocytosis and tachycardia: This is due to community-acquired pneumonia.   4. COPD exacerbation: Plan to start weaning IV steroids  Due to sinus tachycardia changed to Xopenex rather than albuterol.     5. Tobacco dependence: Patient is highly encouraged to stop smoking. 6. Hyperlipidemia:  Continue simvastatin.  7. BPH: Continue  Flomax  8. Essential hypertension: Continue enalapril and restart diltiazem.  9. Opiate abuse: Patient is currently on MS Contin and oxycodone when necessary.   10. Large area of ecchymosis: Hemoglobin stable. CT scan does not show retroperitoneal abscess, does show resolving perinephric hematoma. Unclear as to etiology of hematoma.  Management plans discussed with the patient and he is in agreement.  CODE STATUS: full  TOTAL TIME TAKING CARE OF THIS PATIENT: 25 minutes.    Rounded with nurse today. POSSIBLE D/C 3- 4 days, DEPENDING ON CLINICAL CONDITION.   Clayton Jarmon M.D on 04/13/2016 at 11:19 AM  Between 7am to 6pm - Pager - [phone] After 6pm go to [email or web address] - password [email or web address] [facility]  Office  [phone]  CC: Primary care physician; Marygrace Drought, MD  Note: This dictation was prepared with Dragon dictation along with smaller phrase technology. Any transcriptional errors that result from this process are unintentional.

## 2016-04-13 NOTE — Plan of Care (Signed)
Problem: Physical Regulation: Goal: Will remain free from infection Outcome: Progressing Pt on antibiotics

## 2016-04-14 ENCOUNTER — Inpatient Hospital Stay: Payer: PPO

## 2016-04-14 DIAGNOSIS — J189 Pneumonia, unspecified organism: Secondary | ICD-10-CM | POA: Diagnosis not present

## 2016-04-14 DIAGNOSIS — J9601 Acute respiratory failure with hypoxia: Secondary | ICD-10-CM | POA: Diagnosis not present

## 2016-04-14 DIAGNOSIS — A419 Sepsis, unspecified organism: Secondary | ICD-10-CM | POA: Diagnosis not present

## 2016-04-14 DIAGNOSIS — R600 Localized edema: Secondary | ICD-10-CM | POA: Diagnosis not present

## 2016-04-14 DIAGNOSIS — J449 Chronic obstructive pulmonary disease, unspecified: Secondary | ICD-10-CM | POA: Diagnosis not present

## 2016-04-14 LAB — BASIC METABOLIC PANEL
Anion gap: 6 (ref 5–15)
BUN: 33 mg/dL — AB (ref 6–20)
CALCIUM: 7.9 mg/dL — AB (ref 8.9–10.3)
CO2: 32 mmol/L (ref 22–32)
CREATININE: 0.84 mg/dL (ref 0.61–1.24)
Chloride: 102 mmol/L (ref 101–111)
GFR calc non Af Amer: >60 mL/min (ref >60)
Glucose, Bld: 127 mg/dL — ABNORMAL HIGH (ref 65–99)
Potassium: 4.4 mmol/L (ref 3.5–5.1)
SODIUM: 140 mmol/L (ref 135–145)

## 2016-04-14 LAB — CBC
HEMATOCRIT: 38.8 % — AB (ref 40.0–52.0)
Hemoglobin: 12.9 g/dL — ABNORMAL LOW (ref 13.0–18.0)
MCH: 32.6 pg (ref 26.0–34.0)
MCHC: 33.2 g/dL (ref 32.0–36.0)
MCV: 98.1 fL (ref 80.0–100.0)
Platelets: 291 10*3/uL (ref 150–440)
RBC: 3.96 MIL/uL — ABNORMAL LOW (ref 4.40–5.90)
RDW: 16.1 % — AB (ref 11.5–14.5)
WBC: 19.1 10*3/uL — ABNORMAL HIGH (ref 3.8–10.6)

## 2016-04-14 LAB — GLUCOSE, CAPILLARY
GLUCOSE-CAPILLARY: 146 mg/dL — AB (ref 65–99)
GLUCOSE-CAPILLARY: 124 mg/dL — AB (ref 65–99)
GLUCOSE-CAPILLARY: 212 mg/dL — AB (ref 65–99)
Glucose-Capillary: 153 mg/dL — ABNORMAL HIGH (ref 65–99)

## 2016-04-14 NOTE — Progress Notes (Signed)
[place] [facility] [place] NAME: Stanley Rios    MR#:  [id number]  DATE OF BIRTH:  1955/09/05  SUBJECTIVE:   Right arm swelling from IV infiltration. Denies wheezing. Shortness of breath is improved. Hematoma pain is the same.  REVIEW OF SYSTEMS:    Review of Systems  Constitutional: Negative for chills, fever and malaise/fatigue.  HENT: Negative.  Negative for ear discharge, ear pain, hearing loss, nosebleeds and sore throat.   Eyes: Negative.  Negative for blurred vision and pain.  Respiratory: Positive for cough. Negative for hemoptysis and sputum production. Shortness of breath: improved.   Cardiovascular: Negative.  Negative for chest pain, palpitations and leg swelling.  Gastrointestinal: Negative.  Negative for abdominal pain, blood in stool, diarrhea, nausea and vomiting.  Genitourinary: Negative.  Negative for dysuria.  Musculoskeletal: Positive for back pain.  Skin:       Large hematoma left back Right arm swelling  Neurological: Positive for weakness. Negative for dizziness, tremors, speech change, focal weakness, seizures and headaches.  Endo/Heme/Allergies: Negative.  Does not bruise/bleed easily.  Psychiatric/Behavioral: Negative.  Negative for depression, hallucinations and suicidal ideas.    Tolerating Diet: yes      DRUG ALLERGIES:  No Known Allergies  VITALS:  Blood pressure 136/68, pulse (!) 117, temperature 98.2 F (36.8 C), temperature source Oral, resp. rate 20, height '5\' 11"'$  (1.803 m), weight 84.6 kg (186 lb 6.4 oz), SpO2 95 %.  PHYSICAL EXAMINATION:   Physical Exam  Constitutional: He is oriented to person, place, and time and well-developed, well-nourished, and in no distress. No distress.  HENT:  Head: Normocephalic.  Placed on hfnc  Eyes: No scleral icterus.  Neck: Normal range of motion. Neck supple. No JVD present. No tracheal deviation present.  Cardiovascular: Normal rate, regular rhythm and normal  heart sounds.  Exam reveals no gallop and no friction rub.   No murmur heard. Pulmonary/Chest: Effort normal. No respiratory distress. He has no wheezes. He has rhonchi. He has no rales. He exhibits no tenderness.  Abdominal: Soft. Bowel sounds are normal. He exhibits distension. He exhibits no mass. There is no tenderness. There is no rebound and no guarding.  Musculoskeletal: Normal range of motion. He exhibits no edema.  Neurological: He is alert and oriented to person, place, and time.  Skin: Skin is warm. No rash noted. He is not diaphoretic. No erythema.     Large area of ecchymosis left flank  Psychiatric: Affect and judgment normal.      LABORATORY PANEL:   CBC  Recent Labs Lab 04/14/16 0459  WBC 19.1*  HGB 12.9*  HCT 38.8*  PLT 291   ------------------------------------------------------------------------------------------------------------------  Chemistries   Recent Labs Lab 04/14/16 0459  NA 140  K 4.4  CL 102  CO2 32  GLUCOSE 127*  BUN 33*  CREATININE 0.84  CALCIUM 7.9*   ------------------------------------------------------------------------------------------------------------------  Cardiac Enzymes  Recent Labs Lab 04/07/16 1239  TROPONINI <0.03   ------------------------------------------------------------------------------------------------------------------  RADIOLOGY:  Dg Chest 1 View  Result Date: 04/13/2016 CLINICAL DATA:  Patient with history of pneumonia.  Follow-up exam. EXAM: CHEST 1 VIEW COMPARISON:  Chest radiograph 04/11/2016. FINDINGS: Monitoring leads overlie the patient. Stable cardiac and mediastinal contours. Persistent bandlike opacities within the right middle lobe and left lower lobe. Possible small left pleural effusion. No pneumothorax. IMPRESSION: Persistent bandlike consolidation within the right middle lobe and left lower lung potentially representing atelectasis. Infection not excluded. Possible small left pleural  effusion. Electronically Signed  By: Lovey Newcomer M.D.   On: 04/13/2016 11:19   Dg Abd 1 View  Result Date: 04/13/2016 CLINICAL DATA:  F/u pneumonia; pt denies any abdominal pain, nausea, vomiting, and diarrhea; hx/o cholecystectomy, COPD and HTN. Smoker. EXAM: ABDOMEN - 1 VIEW COMPARISON:  None. FINDINGS: No dilated loops of large or small bowel. No pathologic calcifications. No organomegaly. No aggressive osseous lesion. No intraperitoneal free air. Moderate volume stool throughout the colon. IMPRESSION: No acute findings.  Moderate volume stool suggests constipation Electronically Signed   By: Suzy Bouchard M.D.   On: 04/13/2016 11:27     ASSESSMENT AND PLAN:   60 year old male with a history of COPD and tobacco dependence who presents with bilateral community-acquired pneumonia and COPD exacerbation.  1. Acute hypoxic respiratory failure in the setting of community acquired pneumonia and COPD exacerbationAnd atelectasis. CT scan shows bilateral pneumonia with atelectasis and partial right middle lobe collapse due to atelectasis. Repeat CXR still with Persistent bandlike consolidation within the right middle lobe and left lower lung potentially representing atelectasis. Infection not excluded. High flow oxygen being weaned.  Continue ISS. Continue to Wean steroids as tolerated Continue Zosyn  MRSA PCR negative and therefore vancomycin discontinued  Blood cultures negative to date. Respiratory status likely worsen given splinting on the left side secondary to pain  2. Bilateral lobe Community acquired pneumonia: Plan as outlined above.  3. Sepsis present on admission with leukocytosis and tachycardia: This is due to community-acquired pneumonia.   4. COPD exacerbation: Continue to wean IV steroids Due to sinus tachycardia changed to Xopenex rather than albuterol.     5. Tobacco dependence: Patient is highly encouraged to stop smoking. 6. Hyperlipidemia: Continue  simvastatin.  7. BPH: Continue Flomax  8. Essential hypertension: Continue enalapril and restart diltiazem.  9. Opiate abuse: Patient is currently on MS Contin and oxycodone when necessary.   10. Large area of ecchymosis, back: Hemoglobin stable. CT scan does not show retroperitoneal abscess, does show resolving perinephric hematoma. Unclear as to etiology of hematoma.  11. Right upper extremity swelling likely due to IV infiltration: Check ultrasound evaluate for underlying DVT versus thrombophlebitis. Warm compress  Management plans discussed with the patient and he is in agreement.  CODE STATUS: full  TOTAL TIME TAKING CARE OF THIS PATIENT: 25 minutes.    D/w nurse today POSSIBLE D/C 3- 4 days, DEPENDING ON CLINICAL CONDITION.   Daron Breeding M.D on 04/14/2016 at 11:07 AM  Between 7am to 6pm - Pager - [phone] After 6pm go to [email or web address] - password [email or web address] [facility]  Office  [phone]  CC: Primary care physician; Marygrace Drought, MD  Note: This dictation was prepared with Dragon dictation along with smaller phrase technology. Any transcriptional errors that result from this process are unintentional.

## 2016-04-14 NOTE — Progress Notes (Signed)
PT Cancellation Note  Patient Details Name: Stanley Rios. MRN: [id number] DOB: [birth year]   Cancelled Treatment:    Reason Eval/Treat Not Completed: Medical issues which prohibited therapy   Awaiting results to r/o UE DVT.  Session held.     Chesley Noon 04/14/2016, 3:43 PM

## 2016-04-14 NOTE — Care Management Important Message (Signed)
Important Message  Patient Details  Name: Stanley Rios. MRN: [id number] Date of Birth: [birth year]   Medicare Important Message Given:  Yes    Justa Hatchell A, RN 04/14/2016, 7:09 AM

## 2016-04-14 NOTE — Progress Notes (Signed)
PT Cancellation Note  Patient Details Name: Stanley Rios. MRN: [id number] DOB: 1956-06-13   Cancelled Treatment:    Reason Eval/Treat Not Completed: Other (comment) Pt presenting with R arm swelling per chart review and MD has an order for [place] to r/o DVT. Pt on hold from PT until US findings and DVT r/o.   Riley Nearing, SPT 04/14/2016, 11:28 AM

## 2016-04-14 NOTE — Progress Notes (Signed)
I tried the pt. On 4L nasal cannula. His Sa02 was 88-89%. Placed him back on high flow nasal cannula. I reduced the flos to 35 and the fi02 to 33%.

## 2016-04-14 NOTE — Progress Notes (Signed)
Pharmacy Antibiotic Note  Stanley Rios. is a 60 y.o. male admitted on 04/06/2016 with pneumonia(CAP).  Pharmacy has been consulted for Zosyn/Vancomycin dosing on Day 5 of admission.  Patient was on Azithromycin outpatient. Received Levaquin 9/10-9/14.  Plan: Will continue current Zosyn regimen.     Height: '5\' 11"'$  (180.3 cm) Weight: 186 lb 6.4 oz (84.6 kg) IBW/kg (Calculated) : 75.3  Temp (24hrs), Avg:98.2 F (36.8 C), Min:97.9 F (36.6 C), Max:98.4 F (36.9 C)   Recent Labs Lab 04/10/16 0421 04/10/16 1303 04/11/16 0425 04/11/16 1158 04/12/16 0609 04/13/16 0522 04/14/16 0459  WBC 26.2* 26.3*  --  29.5* 24.8* 19.7* 19.1*  CREATININE 0.94  --  1.00  --  0.81 0.83 0.84    Estimated Creatinine Clearance: 99.6 mL/min (by C-G formula based on SCr of 0.84 mg/dL).    No Known Allergies  Antimicrobials this admission: Levaquin 9/10 >> 9/14 Vanc  9/14 >>  9/15 Zosyn 9/14 >>  Dose adjustments this admission:   Microbiology results: 9/10 BCx: neg.   UCx:      Sputum:    9/14 MRSA [id number]  Thank you for allowing pharmacy to be a part of this patient's care.  Karyn Brull D 04/14/2016 11:01 AM

## 2016-04-14 NOTE — Progress Notes (Signed)
Date: 04/14/2016,   MRN# [id number] Stanley Rios. 06/12/1956 Code Status:     Code Status Orders        Start     Ordered   04/06/16 2133  Full code  Continuous     04/06/16 2132    Code Status History    Date Active Date Inactive Code Status Order ID Comments User Context   02/09/2016 11:47 PM 02/10/2016  4:18 PM Full Code [id number]  Lance Coon, MD Inpatient   10/26/2015  6:09 AM 10/27/2015  6:19 PM Full Code [id number]  Saundra Shelling, MD ED     Hosp day:'@LENGTHOFSTAYDAYS'$ @ Referring MD: '@ATDPROV'$ @            HPI:  Slowly improving. less sob, still c/o of chest pain, right arm swelling, venous doppler pending  PMHX:   Past Medical History:  Diagnosis Date  . Anxiety   . Chronic back pain   . Chronic neck pain   . COPD (chronic obstructive pulmonary disease) ([place])   . Depression   . Hyperlipidemia   . Hypertension   . Opiate abuse, continuous    Surgical Hx:  Past Surgical History:  Procedure Laterality Date  . CHOLECYSTECTOMY    . NECK SURGERY  2002 approx   fusion, about 12 years ago  . SPINAL FUSION     ?? C3- C4   Family Hx:  Family History  Problem Relation Age of Onset  . Bladder Cancer Father     deceased   Social Hx:   Social History  Substance Use Topics  . Smoking status: Current Every Day Smoker    Packs/day: 0.50    Types: Cigarettes  . Smokeless tobacco: Never Used  . Alcohol use No   Medication:    Home Medication:  Current Outpatient Rx  . Order #: [id number] Class: Normal  . Order #: [id number] Class: Normal  . Order #: [id number] Class: Print    Current Medication: '@CURMEDTAB'$ @   Allergies:  Review of patient's allergies indicates no known allergies.  Review of Systems: Gen:  Denies  fever, sweats, chills, HEENT: Denies blurred vision, double vision, ear pain, eye pain, hearing loss, nose bleeds, sore throat Cvc:  No dizziness, chest pain or heaviness Resp:   + ve pleuritic chest pain Gi: Denies swallowing difficulty, stomach  pain, nausea or vomiting, diarrhea, constipation, bowel incontinence Gu:  Denies bladder incontinence, burning urine Ext:   No Joint pain, stiffness or swelling Skin: No skin rash, easy bruising or bleeding or hives Endoc:  No polyuria, polydipsia , polyphagia or weight change Psych: No depression, insomnia or hallucinations  Other:  All other systems negative  Physical Examination:   VS: BP 131/74 (BP Location: Right Arm)   Pulse (!) 101   Temp 97.8 F (36.6 C) (Oral)   Resp 20   Ht '5\' 11"'$  (1.803 m)   Wt 186 lb 6.4 oz (84.6 kg)   SpO2 93%   BMI 26.00 kg/m   General Appearance: No distress, sleeping o2 os.   Neuro: without focal findings, mental status, speech normal, alert and oriented, cranial nerves 2-12 intact, reflexes normal and symmetric, sensation grossly normal  HEENT: PERRLA, EOM intact, no ptosis, no other lesions noticedi: Pulmonary:.rare wheezing, No rales     Cardiovascular:  Normal S1,S2.  No m/r/g.    Abdomen:Benign, Soft, non-tender, No masses, hepatosplenomegaly, No lymphadenopathy Endoc: No evident thyromegaly, no signs of acromegaly or Cushing features Skin:   warm, no rashes, no ecchymosis  Extremities:  no  cyanosis, clubbing,  Upper extremity swelling, warm with normal capillary refill.   Labs results:   Recent Labs     04/12/16  0609  04/13/16  0522  04/14/16  0459  HGB  12.5*  12.6*  12.9*  HCT  36.3*  37.9*  38.8*  MCV  95.8  97.0  98.1  WBC  24.8*  19.7*  19.1*  BUN  30*  32*  33*  CREATININE  0.81  0.83  0.84  GLUCOSE  152*  219*  127*  CALCIUM  8.2*  8.0*  7.9*  ,     Rad results:  CLINICAL DATA:  Patient with history of pneumonia.  Follow-up exam.  EXAM: CHEST 1 VIEW  COMPARISON:  Chest radiograph 04/11/2016.  FINDINGS: Monitoring leads overlie the patient. Stable cardiac and mediastinal contours. Persistent bandlike opacities within the right middle lobe and left lower lobe. Possible small left pleural effusion.  No pneumothorax.  IMPRESSION: Persistent bandlike consolidation within the right middle lobe and left lower lung potentially representing atelectasis. Infection not excluded. Possible small left pleural effusion.   Electronically Signed   By: Lovey Newcomer M.D.   On: 04/13/2016 11:19     Assessment and Plan: Bilateral pneumonia. Slowly improving no MRSA, vancomycin d/'ed Pleuritic pain.  Copd  Possible small right pleural effusion  Weaning fio2 agree with copd and pneumonia regimen Increase activity dvt prophylaxis D/c planning Out patient pulmonary follow up visitl      I have personally obtained a history, examined the patient, evaluated laboratory and imaging results, formulated the assessment and plan and placed orders.  The Patient requires high complexity decision making for assessment and support, frequent evaluation and titration of therapies, application of advanced monitoring technologies and extensive interpretation of multiple databases.   Vinayak Bobier,M.D. Pulmonary & Critical care Medicine [facility]

## 2016-04-15 DIAGNOSIS — L8990 Pressure ulcer of unspecified site, unspecified stage: Secondary | ICD-10-CM | POA: Insufficient documentation

## 2016-04-15 DIAGNOSIS — J449 Chronic obstructive pulmonary disease, unspecified: Secondary | ICD-10-CM | POA: Diagnosis not present

## 2016-04-15 DIAGNOSIS — A419 Sepsis, unspecified organism: Secondary | ICD-10-CM | POA: Diagnosis not present

## 2016-04-15 DIAGNOSIS — J189 Pneumonia, unspecified organism: Secondary | ICD-10-CM | POA: Diagnosis not present

## 2016-04-15 DIAGNOSIS — J9601 Acute respiratory failure with hypoxia: Secondary | ICD-10-CM | POA: Diagnosis not present

## 2016-04-15 LAB — CBC
HCT: 37 % — ABNORMAL LOW (ref 40.0–52.0)
Hemoglobin: 12.6 g/dL — ABNORMAL LOW (ref 13.0–18.0)
MCH: 32.5 pg (ref 26.0–34.0)
MCHC: 34 g/dL (ref 32.0–36.0)
MCV: 95.8 fL (ref 80.0–100.0)
PLATELETS: 275 10*3/uL (ref 150–440)
RBC: 3.87 MIL/uL — ABNORMAL LOW (ref 4.40–5.90)
RDW: 15.9 % — AB (ref 11.5–14.5)
WBC: 16.9 10*3/uL — AB (ref 3.8–10.6)

## 2016-04-15 LAB — GLUCOSE, CAPILLARY
Glucose-Capillary: 134 mg/dL — ABNORMAL HIGH (ref 65–99)
Glucose-Capillary: 141 mg/dL — ABNORMAL HIGH (ref 65–99)
Glucose-Capillary: 235 mg/dL — ABNORMAL HIGH (ref 65–99)
Glucose-Capillary: 186 mg/dL — ABNORMAL HIGH (ref 65–99)

## 2016-04-15 LAB — BASIC METABOLIC PANEL
Anion gap: 4 — ABNORMAL LOW (ref 5–15)
BUN: 31 mg/dL — AB (ref 6–20)
CALCIUM: 8 mg/dL — AB (ref 8.9–10.3)
CO2: 31 mmol/L (ref 22–32)
Chloride: 103 mmol/L (ref 101–111)
Creatinine, Ser: 0.89 mg/dL (ref 0.61–1.24)
GFR calc Af Amer: >60 mL/min (ref >60)
GLUCOSE: 139 mg/dL — AB (ref 65–99)
Potassium: 4.3 mmol/L (ref 3.5–5.1)
SODIUM: 138 mmol/L (ref 135–145)

## 2016-04-15 MED ORDER — BISACODYL 5 MG PO TBEC
5.0000 mg | DELAYED_RELEASE_TABLET | Freq: Once | ORAL | Status: AC
  Administered 2016-04-15: 5 mg via ORAL
  Filled 2016-04-15: qty 1

## 2016-04-15 NOTE — Progress Notes (Signed)
Physical Therapy Treatment Patient Details Name: Stanley Rios. MRN: [id number] DOB: April 15, 1956 Today's Date: 04/15/2016    History of Present Illness Pt is a 60 y/o male presenting with flank pain, wheezing, coughing, and SOB. Pt admitted with community aquired pneumonia. PMH includes COPD, chronic neck pain, chronic back pain, anxiety, depression, HTN, and opiate abuse.    PT Comments    Pt in bed agrees to session.  Participated in exercises as described below.  Discussed HR with primary nurse as he in in 120's at rest.  O2 sats 94%.  OK received for mobility.  He was able to get in /out of bed with use of rails but no physical assist.  He stood x 2 at bedside for standing exercises.  Ambulation was limited due to HFNC but was able to do standing exercises.  Pt limited by LE weakness and HR increased to low 130's with standing mobility.  Pt allowed to rest as needed during session.   Follow Up Recommendations  Home health PT;Supervision for mobility/OOB     Equipment Recommendations  Rolling walker with 5" wheels    Recommendations for Other Services       Precautions / Restrictions Precautions Precautions: None Restrictions Weight Bearing Restrictions: No    Mobility  Bed Mobility Overal bed mobility: Modified Independent             General bed mobility comments: increased time and rails but no physical assist  Transfers Overall transfer level: Needs assistance Equipment used: Rolling walker (2 wheeled) Transfers: Sit to/from Stand Sit to Stand: Min guard (verbal cues for hand placements)            Ambulation/Gait             General Gait Details: limited by HFNC   Stairs            Wheelchair Mobility    Modified Rankin (Stroke Patients Only)       Balance Overall balance assessment: Modified Independent                                  Cognition Arousal/Alertness: Awake/alert Behavior During Therapy: WFL for  tasks assessed/performed Overall Cognitive Status: Within Functional Limits for tasks assessed                      Exercises General Exercises - Lower Extremity Ankle Circles/Pumps: AROM;Strengthening;Both;10 reps;Supine Short Arc Quad: AROM;Both;10 reps;Supine Heel Slides: AROM;Both;10 reps;Supine    General Comments        Pertinent Vitals/Pain Pain Assessment: 0-10 Pain Score: 8  Pain Location: L side Pain Descriptors / Indicators: Sore Pain Intervention(s): Limited activity within patient's tolerance    Home Living                      Prior Function            PT Goals (current goals can now be found in the care plan section)      Frequency    Min 2X/week      PT Plan Current plan remains appropriate    Co-evaluation             End of Session Equipment Utilized During Treatment: Gait belt;Oxygen         Time: 2440-1027 PT Time Calculation (min) (ACUTE ONLY): 46 min  Charges:  $Therapeutic Exercise: 23-37 mins $Therapeutic Activity:  8-22 mins                    G Codes:      Chesley Noon 04/24/16, 1:44 PM

## 2016-04-15 NOTE — Progress Notes (Signed)
Okay per Dr. Benjie Karvonen for patient to have pulse ox of 90 when trying to wean off high flow

## 2016-04-15 NOTE — Progress Notes (Signed)
[place] [facility] [place] NAME: Stanley Rios    MR#:  [id number]  DATE OF BIRTH:  April 03, 1956  SUBJECTIVE:   Shortness of breath and cough have improved. Still continues to have back pain from hematoma. Right arm swelling has improved.Marland Kitchen  REVIEW OF SYSTEMS:    Review of Systems  Constitutional: Negative for chills, fever and malaise/fatigue.  HENT: Negative.  Negative for ear discharge, ear pain, hearing loss, nosebleeds and sore throat.   Eyes: Negative.  Negative for blurred vision and pain.  Respiratory: Negative for cough, hemoptysis and sputum production. Shortness of breath: improved.   Cardiovascular: Negative.  Negative for chest pain, palpitations and leg swelling.  Gastrointestinal: Negative.  Negative for abdominal pain, blood in stool, diarrhea, nausea and vomiting.  Genitourinary: Negative.  Negative for dysuria.  Musculoskeletal: Positive for back pain.  Skin:       Large hematoma left back Right arm swelling  Neurological: Positive for weakness. Negative for dizziness, tremors, speech change, focal weakness, seizures and headaches.  Endo/Heme/Allergies: Negative.  Does not bruise/bleed easily.  Psychiatric/Behavioral: Negative.  Negative for depression, hallucinations and suicidal ideas.    Tolerating Diet: yes      DRUG ALLERGIES:  No Known Allergies  VITALS:  Blood pressure 114/75, pulse (!) 118, temperature 98 F (36.7 C), temperature source Oral, resp. rate 17, height '5\' 11"'$  (1.803 m), weight 84.6 kg (186 lb 6.4 oz), SpO2 94 %.  PHYSICAL EXAMINATION:   Physical Exam  Constitutional: He is oriented to person, place, and time and well-developed, well-nourished, and in no distress. No distress.  HENT:  Head: Normocephalic.  Placed on hfnc  Eyes: No scleral icterus.  Neck: Normal range of motion. Neck supple. No JVD present. No tracheal deviation present.  Cardiovascular: Normal rate, regular rhythm and normal heart  sounds.  Exam reveals no gallop and no friction rub.   No murmur heard. Pulmonary/Chest: Effort normal. No respiratory distress. He has no wheezes. He has rhonchi. He has no rales. He exhibits no tenderness.  Abdominal: Soft. Bowel sounds are normal. He exhibits distension. He exhibits no mass. There is no tenderness. There is no rebound and no guarding.  Musculoskeletal: Normal range of motion. He exhibits no edema.  Neurological: He is alert and oriented to person, place, and time.  Skin: Skin is warm. No rash noted. He is not diaphoretic. No erythema.     Large area of ecchymosis left flank  Psychiatric: Affect and judgment normal.      LABORATORY PANEL:   CBC  Recent Labs Lab 04/15/16 0544  WBC 16.9*  HGB 12.6*  HCT 37.0*  PLT 275   ------------------------------------------------------------------------------------------------------------------  Chemistries   Recent Labs Lab 04/15/16 0544  NA 138  K 4.3  CL 103  CO2 31  GLUCOSE 139*  BUN 31*  CREATININE 0.89  CALCIUM 8.0*   ------------------------------------------------------------------------------------------------------------------  Cardiac Enzymes No results for input(s): TROPONINI in the last 168 hours. ------------------------------------------------------------------------------------------------------------------  RADIOLOGY:  Dg Chest 1 View  Result Date: 04/13/2016 CLINICAL DATA:  Patient with history of pneumonia.  Follow-up exam. EXAM: CHEST 1 VIEW COMPARISON:  Chest radiograph 04/11/2016. FINDINGS: Monitoring leads overlie the patient. Stable cardiac and mediastinal contours. Persistent bandlike opacities within the right middle lobe and left lower lobe. Possible small left pleural effusion. No pneumothorax. IMPRESSION: Persistent bandlike consolidation within the right middle lobe and left lower lung potentially representing atelectasis. Infection not excluded. Possible small left pleural  effusion. Electronically Signed  By: Lovey Newcomer M.D.   On: 04/13/2016 11:19   Dg Abd 1 View  Result Date: 04/13/2016 CLINICAL DATA:  F/u pneumonia; pt denies any abdominal pain, nausea, vomiting, and diarrhea; hx/o cholecystectomy, COPD and HTN. Smoker. EXAM: ABDOMEN - 1 VIEW COMPARISON:  None. FINDINGS: No dilated loops of large or small bowel. No pathologic calcifications. No organomegaly. No aggressive osseous lesion. No intraperitoneal free air. Moderate volume stool throughout the colon. IMPRESSION: No acute findings.  Moderate volume stool suggests constipation Electronically Signed   By: Suzy Bouchard M.D.   On: 04/13/2016 11:27     ASSESSMENT AND PLAN:   60 year old male with a history of COPD and tobacco dependence who presents with bilateral community-acquired pneumonia and COPD exacerbation.  1. Acute hypoxic respiratory failure in the setting of community acquired pneumonia and COPD exacerbationAnd atelectasis. CT scan shows bilateral pneumonia with atelectasis and partial right middle lobe collapse due to atelectasis. Repeat CXR still with Persistent bandlike consolidation within the right middle lobe and left lower lung potentially representing atelectasis. Infection not excluded. High flow oxygen being weaned. Okay to keep saturations between 90-92% Continue ISS. Continue to Wean steroids as tolerated Continue Zosyn  MRSA PCR negative and therefore vancomycin discontinued  Blood cultures negative to date.   2. Bilateral lobe Community acquired pneumonia: Plan as outlined above.  3. Sepsis present on admission with leukocytosis and tachycardia: This is due to community-acquired pneumonia.   4. COPD exacerbation: Continue to wean IV steroids Due to sinus tachycardia changed to Xopenex rather than albuterol.     5. Tobacco dependence: Patient is highly encouraged to stop smoking. 6. Hyperlipidemia: Continue simvastatin.  7. BPH: Continue Flomax  8. Essential  hypertension: Continue enalapril and restart diltiazem.  9. Opiate abuse: Patient is currently on MS Contin and oxycodone when necessary.   10. Large area of ecchymosis, back: Hemoglobin stable. CT scan does not show retroperitoneal abscess, does show resolving perinephric hematoma. Unclear as to etiology of hematoma.  11. Right upper extremity swelling likely due to IV infiltration: No evidence of right upper extremity DVT or superficial thrombophlebitis.   Management plans discussed with the patient and he is in agreement.  CODE STATUS: full  TOTAL TIME TAKING CARE OF THIS PATIENT: 25 minutes.    Rounded with nurse today POSSIBLE D/C 3- 4 days, DEPENDING ON CLINICAL CONDITION.   Ovie Eastep M.D on 04/15/2016 at 11:36 AM  Between 7am to 6pm - Pager - [phone] After 6pm go to [email or web address] - password [email or web address] [facility]  Office  [phone]  CC: Primary care physician; Marygrace Drought, MD  Note: This dictation was prepared with Dragon dictation along with smaller phrase technology. Any transcriptional errors that result from this process are unintentional.

## 2016-04-16 DIAGNOSIS — J189 Pneumonia, unspecified organism: Secondary | ICD-10-CM | POA: Diagnosis not present

## 2016-04-16 DIAGNOSIS — A419 Sepsis, unspecified organism: Secondary | ICD-10-CM | POA: Diagnosis not present

## 2016-04-16 DIAGNOSIS — J9601 Acute respiratory failure with hypoxia: Secondary | ICD-10-CM | POA: Diagnosis not present

## 2016-04-16 DIAGNOSIS — J449 Chronic obstructive pulmonary disease, unspecified: Secondary | ICD-10-CM | POA: Diagnosis not present

## 2016-04-16 LAB — GLUCOSE, CAPILLARY
GLUCOSE-CAPILLARY: 146 mg/dL — AB (ref 65–99)
GLUCOSE-CAPILLARY: 308 mg/dL — AB (ref 65–99)
GLUCOSE-CAPILLARY: 189 mg/dL — AB (ref 65–99)
Glucose-Capillary: 84 mg/dL (ref 65–99)

## 2016-04-16 LAB — PROCALCITONIN

## 2016-04-16 MED ORDER — IBUPROFEN 400 MG PO TABS
400.0000 mg | ORAL_TABLET | Freq: Four times a day (QID) | ORAL | Status: DC | PRN
  Administered 2016-04-16 – 2016-04-17 (×4): 400 mg via ORAL
  Filled 2016-04-16 (×3): qty 1

## 2016-04-16 NOTE — Care Management (Signed)
Patient weaned from HFNC to 6 liters [place]

## 2016-04-16 NOTE — Progress Notes (Signed)
Physical Therapy Treatment Patient Details Name: Stanley Rios. MRN: [id number] DOB: 06/08/1956 Today's Date: 04/16/2016    History of Present Illness Pt is a 60 y/o male presenting with flank pain, wheezing, coughing, and SOB. Pt admitted with community aquired pneumonia. PMH includes COPD, chronic neck pain, chronic back pain, anxiety, depression, HTN, and opiate abuse.    PT Comments    Pt in bed ready for session.  Participated in exercises as described below.  Pt was able to get to edge of bed with handrails.  Stood with min guard and was able to ambulate 70' x 2 with rolling walker and min guard with assist for O2.  Gait generally steady without LOB.  HR 113 at rest with O2 sats 96% on 5lpm.  During gait HR max 120 and sats 95%.  Pt tolerated session very well today.   Follow Up Recommendations  Home health PT;Supervision for mobility/OOB     Equipment Recommendations    Rollin walker   Recommendations for Other Services       Precautions / Restrictions Precautions Precautions: None Restrictions Weight Bearing Restrictions: No    Mobility  Bed Mobility Overal bed mobility: Modified Independent             General bed mobility comments: increased time and rails but no physical assist  Transfers Overall transfer level: Needs assistance Equipment used: Rolling walker (2 wheeled) Transfers: Sit to/from Stand Sit to Stand: Min guard            Ambulation/Gait Ambulation/Gait assistance: Min guard Ambulation Distance (Feet): 70 Feet (X 2) Assistive device: Rolling walker (2 wheeled) Gait Pattern/deviations: Step-through pattern Gait velocity: decreased   General Gait Details: Generally steady   Stairs            Wheelchair Mobility    Modified Rankin (Stroke Patients Only)       Balance Overall balance assessment: Modified Independent                                  Cognition Arousal/Alertness: Awake/alert Behavior  During Therapy: WFL for tasks assessed/performed Overall Cognitive Status: Within Functional Limits for tasks assessed                      Exercises Other Exercises Other Exercises: Supine exercises for ankle pumps, SLR, AB/ADD, heel slides x 10 bialterally    General Comments        Pertinent Vitals/Pain Pain Assessment: 0-10 Pain Score: 6  Pain Location: L side Pain Descriptors / Indicators: Sore Pain Intervention(s): Monitored during session    Home Living                      Prior Function            PT Goals (current goals can now be found in the care plan section) Progress towards PT goals: Progressing toward goals    Frequency    Min 2X/week      PT Plan Current plan remains appropriate    Co-evaluation             End of Session Equipment Utilized During Treatment: Gait belt;Oxygen Activity Tolerance: Patient tolerated treatment well Patient left: in chair;with call bell/phone within reach;with chair alarm set;with nursing/sitter in room     Time: 0940-1004 PT Time Calculation (min) (ACUTE ONLY): 24 min  Charges:  $Gait Training: 8-22  mins $Therapeutic Exercise: 8-22 mins                    G Codes:      Chesley Noon 18-Apr-2016, 10:19 AM

## 2016-04-16 NOTE — Progress Notes (Signed)
[facility] at [place] NAME: Stanley Rios    MR#:  [id number]  DATE OF BIRTH:  [birth year]  SUBJECTIVE:  Feeling much better REVIEW OF SYSTEMS:    Review of Systems  Constitutional: Negative for chills, fever and malaise/fatigue.  HENT: Negative.  Negative for ear discharge, ear pain, hearing loss, nosebleeds and sore throat.   Eyes: Negative.  Negative for blurred vision and pain.  Respiratory: Negative for cough, hemoptysis and sputum production. Shortness of breath: improved.   Cardiovascular: Negative.  Negative for chest pain, palpitations and leg swelling.  Gastrointestinal: Negative.  Negative for abdominal pain, blood in stool, diarrhea, nausea and vomiting.  Genitourinary: Negative.  Negative for dysuria.  Musculoskeletal: Positive for back pain.  Skin:       Large hematoma left back Right arm swelling  Neurological: Positive for weakness. Negative for dizziness, tremors, speech change, focal weakness, seizures and headaches.  Endo/Heme/Allergies: Negative.  Does not bruise/bleed easily.  Psychiatric/Behavioral: Negative.  Negative for depression, hallucinations and suicidal ideas.    Tolerating Diet: yes DRUG ALLERGIES:  No Known Allergies VITALS:  Blood pressure 113/67, pulse (!) 106, temperature 97.7 F (36.5 C), temperature source Oral, resp. rate 20, height '5\' 11"'$  (1.803 m), weight 84.6 kg (186 lb 6.4 oz), SpO2 95 %. PHYSICAL EXAMINATION:   Physical Exam  Constitutional: He is oriented to person, place, and time and well-developed, well-nourished, and in no distress. No distress.  HENT:  Head: Normocephalic.  Eyes: No scleral icterus.  Neck: Normal range of motion. Neck supple. No JVD present. No tracheal deviation present.  Cardiovascular: Normal rate, regular rhythm and normal heart sounds.  Exam reveals no gallop and no friction rub.   No murmur heard. Pulmonary/Chest: Effort normal. No respiratory distress. He has no  wheezes. He has no rhonchi. He has no rales. He exhibits no tenderness.  Abdominal: Soft. Bowel sounds are normal. He exhibits no distension and no mass. There is no tenderness. There is no rebound and no guarding.  Musculoskeletal: Normal range of motion. He exhibits no edema.  Neurological: He is alert and oriented to person, place, and time.  Skin: Skin is warm. No rash noted. He is not diaphoretic. No erythema.     Large area of ecchymosis left flank  Psychiatric: Affect and judgment normal.   LABORATORY PANEL:   CBC  Recent Labs Lab 04/15/16 0544  WBC 16.9*  HGB 12.6*  HCT 37.0*  PLT 275   ------------------------------------------------------------------------------------------------------------------  Chemistries   Recent Labs Lab 04/15/16 0544  NA 138  K 4.3  CL 103  CO2 31  GLUCOSE 139*  BUN 31*  CREATININE 0.89  CALCIUM 8.0*   ------------------------------------------------------------------------------------------------------------------  Cardiac Enzymes No results for input(s): TROPONINI in the last 168 hours. ------------------------------------------------------------------------------------------------------------------  RADIOLOGY:  Dg Chest 1 View  Result Date: 04/13/2016 CLINICAL DATA:  Patient with history of pneumonia.  Follow-up exam. EXAM: CHEST 1 VIEW COMPARISON:  Chest radiograph 04/11/2016. FINDINGS: Monitoring leads overlie the patient. Stable cardiac and mediastinal contours. Persistent bandlike opacities within the right middle lobe and left lower lobe. Possible small left pleural effusion. No pneumothorax. IMPRESSION: Persistent bandlike consolidation within the right middle lobe and left lower lung potentially representing atelectasis. Infection not excluded. Possible small left pleural effusion. Electronically Signed   By: Lovey Newcomer M.D.   On: 04/13/2016 11:19   Dg Abd 1 View  Result Date: 04/13/2016 CLINICAL DATA:  F/u pneumonia; pt  denies any abdominal pain, nausea, vomiting,  and diarrhea; hx/o cholecystectomy, COPD and HTN. Smoker. EXAM: ABDOMEN - 1 VIEW COMPARISON:  None. FINDINGS: No dilated loops of large or small bowel. No pathologic calcifications. No organomegaly. No aggressive osseous lesion. No intraperitoneal free air. Moderate volume stool throughout the colon. IMPRESSION: No acute findings.  Moderate volume stool suggests constipation Electronically Signed   By: Suzy Bouchard M.D.   On: 04/13/2016 11:27   ASSESSMENT AND PLAN:  60 year old male with a history of COPD and tobacco dependence who presents with bilateral community-acquired pneumonia and COPD exacerbation.  1. Acute hypoxic respiratory failure in the setting of community acquired pneumonia and COPD exacerbationAnd atelectasis. - Oxygenation weaned to 3 L, continue weaning to room air - Check ambulatory pulse ox  2. Bilateral lobe Community acquired pneumonia: Pro calcitonin less than 0.1 - we will stop antibiotics  3. Sepsis present on admission with leukocytosis and tachycardia: This is due to community-acquired pneumonia.  Now resolved  4. COPD exacerbation: Continue to wean IV steroids Due to sinus tachycardia changed to Xopenex rather than albuterol.   5. Tobacco dependence: Patient is highly encouraged to stop smoking.  6. Hyperlipidemia: Continue simvastatin.  7. BPH: Continue Flomax  8. Essential hypertension: Continue enalapril and diltiazem.   9. Opiate abuse: He is switched over to Celebrex and Advil, doing okay with that  10. Large area of ecchymosis, back: Hemoglobin stable. CT scan does not show retroperitoneal abscess, does show resolving perinephric hematoma. Unclear as to etiology of hematoma.  11. Right upper extremity swelling likely due to IV infiltration: No evidence of right upper extremity DVT or superficial thrombophlebitis.  12.  Opiate-induced constipation - stopped opiate and now on stool  softener   Management plans discussed with the patient, nursing and he is in agreement.  CODE STATUS: full  TOTAL TIME TAKING CARE OF THIS PATIENT: 25 minutes.    POSSIBLE D/C 1-2 days, DEPENDING ON CLINICAL CONDITION.   [facility] Pamela Maddy M.D on 04/16/2016 at 3:13 PM  Between 7am to 6pm - Pager - [phone] After 6pm go to [email or web address] - password [email or web address] [facility]  Office  [phone]  CC: Primary care physician; Marygrace Drought, MD  Note: This dictation was prepared with Dragon dictation along with smaller phrase technology. Any transcriptional errors that result from this process are unintentional.

## 2016-04-17 DIAGNOSIS — A419 Sepsis, unspecified organism: Secondary | ICD-10-CM | POA: Diagnosis not present

## 2016-04-17 DIAGNOSIS — J449 Chronic obstructive pulmonary disease, unspecified: Secondary | ICD-10-CM | POA: Diagnosis not present

## 2016-04-17 DIAGNOSIS — J9601 Acute respiratory failure with hypoxia: Secondary | ICD-10-CM | POA: Diagnosis not present

## 2016-04-17 DIAGNOSIS — J189 Pneumonia, unspecified organism: Secondary | ICD-10-CM | POA: Diagnosis not present

## 2016-04-17 LAB — GLUCOSE, CAPILLARY
GLUCOSE-CAPILLARY: 180 mg/dL — AB (ref 65–99)
GLUCOSE-CAPILLARY: 139 mg/dL — AB (ref 65–99)
Glucose-Capillary: 105 mg/dL — ABNORMAL HIGH (ref 65–99)
Glucose-Capillary: 228 mg/dL — ABNORMAL HIGH (ref 65–99)

## 2016-04-17 LAB — CBC
HCT: 39.2 % — ABNORMAL LOW (ref 40.0–52.0)
HEMOGLOBIN: 12.8 g/dL — AB (ref 13.0–18.0)
MCH: 32.1 pg (ref 26.0–34.0)
MCHC: 32.6 g/dL (ref 32.0–36.0)
MCV: 98.3 fL (ref 80.0–100.0)
Platelets: 314 10*3/uL (ref 150–440)
RBC: 3.99 MIL/uL — AB (ref 4.40–5.90)
RDW: 15.5 % — ABNORMAL HIGH (ref 11.5–14.5)
WBC: 18.4 10*3/uL — ABNORMAL HIGH (ref 3.8–10.6)

## 2016-04-17 LAB — BASIC METABOLIC PANEL
Anion gap: 4 — ABNORMAL LOW (ref 5–15)
BUN: 25 mg/dL — AB (ref 6–20)
CHLORIDE: 101 mmol/L (ref 101–111)
CO2: 35 mmol/L — ABNORMAL HIGH (ref 22–32)
Calcium: 8.4 mg/dL — ABNORMAL LOW (ref 8.9–10.3)
Creatinine, Ser: 0.91 mg/dL (ref 0.61–1.24)
GFR calc Af Amer: >60 mL/min (ref >60)
GFR calc non Af Amer: >60 mL/min (ref >60)
GLUCOSE: 114 mg/dL — AB (ref 65–99)
POTASSIUM: 4.4 mmol/L (ref 3.5–5.1)
Sodium: 140 mmol/L (ref 135–145)

## 2016-04-17 MED ORDER — METHYLPREDNISOLONE SODIUM SUCC 40 MG IJ SOLR
20.0000 mg | Freq: Every day | INTRAMUSCULAR | Status: DC
  Administered 2016-04-18: 20 mg via INTRAVENOUS
  Filled 2016-04-17: qty 1

## 2016-04-17 MED ORDER — METOPROLOL TARTRATE 25 MG PO TABS
25.0000 mg | ORAL_TABLET | Freq: Two times a day (BID) | ORAL | Status: DC
  Administered 2016-04-17 – 2016-04-18 (×3): 25 mg via ORAL
  Filled 2016-04-17 (×3): qty 1

## 2016-04-17 NOTE — Care Management Important Message (Signed)
Important Message  Patient Details  Name: Stanley Rios. MRN: [id number] Date of Birth: [birth year]   Medicare Important Message Given:  Yes    Beverly Sessions, RN 04/17/2016, 2:30 PM

## 2016-04-17 NOTE — Progress Notes (Addendum)
Nutrition Brief Note  Patient identified for Length of Stay > 10 days  Wt Readings from Last 15 Encounters:  04/06/16 186 lb 6.4 oz (84.6 kg)  03/27/16 172 lb (78 kg)  03/23/16 180 lb 14.4 oz (82.1 kg)  02/09/16 172 lb (78 kg)  01/16/16 177 lb (80.3 kg)  10/25/15 177 lb (80.3 kg)    Body mass index is 26 kg/m. Patient meets criteria for normal weight based on current BMI.   Current diet order is regular, patient is consuming approximately 100% of meals at this time. Labs and medications reviewed.   No nutrition interventions warranted at this time. If nutrition issues arise, please consult RD.   Stanley Anis. Efosa Treichler, MS, RD LDN Inpatient Clinical Dietitian Pager [phone]

## 2016-04-17 NOTE — Progress Notes (Signed)
Inpatient Diabetes Program Recommendations  AACE/ADA: New Consensus Statement on Inpatient Glycemic Control (2015)  Target Ranges:  Prepandial:   less than 140 mg/dL      Peak postprandial:   less than 180 mg/dL (1-2 hours)      Critically ill patients:  140 - 180 mg/dL   Results for DETRIC, SCALISI (MRN [id number]) as of 04/17/2016 13:12  Ref. Range 04/16/2016 07:49 04/16/2016 11:44 04/16/2016 16:43 04/16/2016 21:04 04/17/2016 07:39 04/17/2016 11:41  Glucose-Capillary Latest Ref Range: 65 - 99 mg/dL 84 146 (H) 308 (H) 189 (H) 105 (H) 228 (H)   Review of Glycemic Control  Diabetes history: No Outpatient Diabetes medications: NA Current orders for Inpatient glycemic control: Novolog 0-9 units TID with meals, Novolog 0-5 units QHS  Inpatient Diabetes Program Recommendations: Correction (SSI): Please consider increasing Novolog correction to moderate scale.  Thanks, Barnie Alderman, RN, MSN, CDE Diabetes Coordinator Inpatient Diabetes Program [phone] (Team Pager from [place] to [place]) [phone] ([facility] office) [phone] [facility] office) [phone] [facility] office)

## 2016-04-17 NOTE — Progress Notes (Addendum)
Stanley Rios at [place] NAME: Stanley Rios    MR#:  [id number]  DATE OF BIRTH:  Dec 10, 1955  SUBJECTIVE:  Walking in the hallway, but gets tachycardic, very easily with a heart rate up to 120, maintaining oxygen saturation.  Slowly improving REVIEW OF SYSTEMS:    Review of Systems  Constitutional: Negative for chills, fever and malaise/fatigue.  HENT: Negative.  Negative for ear discharge, ear pain, hearing loss, nosebleeds and sore throat.   Eyes: Negative.  Negative for blurred vision and pain.  Respiratory: Negative for cough, hemoptysis and sputum production. Shortness of breath: improved.   Cardiovascular: Negative.  Negative for chest pain, palpitations and leg swelling.  Gastrointestinal: Negative.  Negative for abdominal pain, blood in stool, diarrhea, nausea and vomiting.  Genitourinary: Negative.  Negative for dysuria.  Musculoskeletal: Positive for back pain.  Skin:       Large hematoma left back Right arm swelling  Neurological: Positive for weakness. Negative for dizziness, tremors, speech change, focal weakness, seizures and headaches.  Endo/Heme/Allergies: Negative.  Does not bruise/bleed easily.  Psychiatric/Behavioral: Negative.  Negative for depression, hallucinations and suicidal ideas.    Tolerating Diet: yes DRUG ALLERGIES:  No Known Allergies VITALS:  Blood pressure 136/88, pulse (!) 108, temperature 97.6 F (36.4 C), temperature source Oral, resp. rate 18, height '5\' 11"'$  (1.803 m), weight 84.6 kg (186 lb 6.4 oz), SpO2 96 %. PHYSICAL EXAMINATION:   Physical Exam  Constitutional: He is oriented to person, place, and time and well-developed, well-nourished, and in no distress. No distress.  HENT:  Head: Normocephalic.  Eyes: No scleral icterus.  Neck: Normal range of motion. Neck supple. No JVD present. No tracheal deviation present.  Cardiovascular: Normal rate, regular rhythm and normal heart sounds.  Exam reveals no  gallop and no friction rub.   No murmur heard. Pulmonary/Chest: Effort normal. No respiratory distress. He has no wheezes. He has no rhonchi. He has no rales. He exhibits no tenderness.  Abdominal: Soft. Bowel sounds are normal. He exhibits no distension and no mass. There is no tenderness. There is no rebound and no guarding.  Musculoskeletal: Normal range of motion. He exhibits no edema.  Neurological: He is alert and oriented to person, place, and time.  Skin: Skin is warm. No rash noted. He is not diaphoretic. No erythema.     Large area of ecchymosis left flank  Psychiatric: Affect and judgment normal.   LABORATORY PANEL:   CBC  Recent Labs Lab 04/17/16 0513  WBC 18.4*  HGB 12.8*  HCT 39.2*  PLT 314   ------------------------------------------------------------------------------------------------------------------  Chemistries   Recent Labs Lab 04/17/16 0513  NA 140  K 4.4  CL 101  CO2 35*  GLUCOSE 114*  BUN 25*  CREATININE 0.91  CALCIUM 8.4*   ------------------------------------------------------------------------------------------------------------------  Cardiac Enzymes No results for input(s): TROPONINI in the last 168 hours. ------------------------------------------------------------------------------------------------------------------  RADIOLOGY:  Dg Chest 1 View  Result Date: 04/13/2016 CLINICAL DATA:  Patient with history of pneumonia.  Follow-up exam. EXAM: CHEST 1 VIEW COMPARISON:  Chest radiograph 04/11/2016. FINDINGS: Monitoring leads overlie the patient. Stable cardiac and mediastinal contours. Persistent bandlike opacities within the right middle lobe and left lower lobe. Possible small left pleural effusion. No pneumothorax. IMPRESSION: Persistent bandlike consolidation within the right middle lobe and left lower lung potentially representing atelectasis. Infection not excluded. Possible small left pleural effusion. Electronically Signed   By:  Lovey Newcomer M.D.   On: 04/13/2016 11:19   Dg  Abd 1 View  Result Date: 04/13/2016 CLINICAL DATA:  F/u pneumonia; pt denies any abdominal pain, nausea, vomiting, and diarrhea; hx/o cholecystectomy, COPD and HTN. Smoker. EXAM: ABDOMEN - 1 VIEW COMPARISON:  None. FINDINGS: No dilated loops of large or small bowel. No pathologic calcifications. No organomegaly. No aggressive osseous lesion. No intraperitoneal free air. Moderate volume stool throughout the colon. IMPRESSION: No acute findings.  Moderate volume stool suggests constipation Electronically Signed   By: Suzy Bouchard M.D.   On: 04/13/2016 11:27   ASSESSMENT AND PLAN:  60 year old male with a history of COPD and tobacco dependence who presents with bilateral community-acquired pneumonia and COPD exacerbation.  1. Acute hypoxic respiratory failure in the setting of community acquired pneumonia and COPD exacerbationAnd atelectasis. - Oxygenation weaned to 2 L, continue weaning to room air - Check ambulatory pulse ox  2. Bilateral lobe Community acquired pneumonia: Pro calcitonin less than 0.1 - off antibiotics.  Ends here yesterday  3. Sepsis present on admission with leukocytosis and tachycardia: This is due to community-acquired pneumonia.  Now resolved  4. COPD exacerbation: Continue to wean IV steroids  * sinus tachycardia: changed to Xopenex rather than albuterol. - On Cardizem.  We will add metoprolol and stop ACE inhibitor   5. Tobacco dependence: Patient is highly encouraged to stop smoking.  6. Hyperlipidemia: Continue simvastatin.  7. BPH: Continue Flomax  8. Essential hypertension: Continue enalapril and diltiazem.   9. Opiate abuse: He is switched over to Celebrex and Advil, doing okay with that  10. Large area of ecchymosis, back: Hemoglobin stable. CT scan does not show retroperitoneal abscess, does show resolving perinephric hematoma. Unclear as to etiology of hematoma.  11. Right upper extremity swelling  likely due to IV infiltration: No evidence of right upper extremity DVT or superficial thrombophlebitis.  12.  Opiate-induced constipation - stopped opiate and now on stool softener  Encourage ambulation   Management plans discussed with the patient, nursing and he is in agreement.  CODE STATUS: full  TOTAL TIME TAKING CARE OF THIS PATIENT: 25 minutes.    POSSIBLE D/C in a.m., DEPENDING ON CLINICAL CONDITION.   [facility], Jaxsen Bernhart M.D on 04/17/2016 at 12:35 PM  Between 7am to 6pm - Pager - [phone] After 6pm go to [email or web address] - password [email or web address] [place] [facility]  Office  [phone]  CC: Primary care physician; Marygrace Drought, MD  Note: This dictation was prepared with Dragon dictation along with smaller phrase technology. Any transcriptional errors that result from this process are unintentional.

## 2016-04-17 NOTE — Progress Notes (Signed)
Physical Therapy Treatment Patient Details Name: Stanley Rios. MRN: [id number] DOB: [birth year] Today's Date: 04/17/2016    History of Present Illness Pt is a 60 y/o male presenting with flank pain, wheezing, coughing, and SOB. Pt admitted with community aquired pneumonia. PMH includes COPD, chronic neck pain, chronic back pain, anxiety, depression, HTN, and opiate abuse.    PT Comments    Pt in bed ready for session.  Bed mobility without assist.  Stood and was able to ambulate 90' with walker and min guard, O2 at 2 lpm.  Pt self terminated gait due to general fatigue.  HR 132  O2 at 92%.  After rest O2 increased to 96%.  Upon return to room, HR decreased to 124 and he was able to participate in seated and standing exercises as described below but HR increased back to 132.  Pt remained OOB in chair.  Discussed HR with MD and primary nurse.   Follow Up Recommendations  Home health PT;Supervision for mobility/OOB     Equipment Recommendations  Rolling walker with 5" wheels    Recommendations for Other Services       Precautions / Restrictions Precautions Precautions: None Restrictions Weight Bearing Restrictions: No    Mobility  Bed Mobility Overal bed mobility: Modified Independent                Transfers Overall transfer level: Needs assistance Equipment used: Rolling walker (2 wheeled) Transfers: Sit to/from Stand Sit to Stand: Supervision            Ambulation/Gait Ambulation/Gait assistance: Min guard Ambulation Distance (Feet): 90 Feet Assistive device: Rolling walker (2 wheeled) Gait Pattern/deviations: Step-through pattern Gait velocity: decreased   General Gait Details: Generally steady   Stairs            Wheelchair Mobility    Modified Rankin (Stroke Patients Only)       Balance Overall balance assessment: Modified Independent                                  Cognition Arousal/Alertness:  Awake/alert Behavior During Therapy: WFL for tasks assessed/performed Overall Cognitive Status: Within Functional Limits for tasks assessed                      Exercises General Exercises - Lower Extremity Ankle Circles/Pumps: AROM;Both;Seated Long Arc Quad: AROM;Both;Seated Straight Leg Raises: AROM;Both;10 reps;Standing Hip Flexion/Marching: AROM;Both;10 reps;Standing    General Comments        Pertinent Vitals/Pain Pain Assessment: 0-10 Pain Score: 4  Pain Location: L side Pain Descriptors / Indicators: Sore Pain Intervention(s): Monitored during session    Home Living                      Prior Function            PT Goals (current goals can now be found in the care plan section) Progress towards PT goals: Progressing toward goals    Frequency    Min 2X/week      PT Plan Current plan remains appropriate    Co-evaluation             End of Session Equipment Utilized During Treatment: Gait belt;Oxygen Activity Tolerance: Patient tolerated treatment well;Other (comment) (increased HR today) Patient left: in chair;with call bell/phone within reach;with chair alarm set;with nursing/sitter in room     Time: 1010-1038 PT Time Calculation (  min) (ACUTE ONLY): 28 min  Charges:  $Gait Training: 8-22 mins $Therapeutic Exercise: 8-22 mins                    G Codes:      Chesley Noon 04/29/2016, 10:42 AM

## 2016-04-18 DIAGNOSIS — J189 Pneumonia, unspecified organism: Secondary | ICD-10-CM | POA: Diagnosis not present

## 2016-04-18 DIAGNOSIS — J9601 Acute respiratory failure with hypoxia: Secondary | ICD-10-CM | POA: Diagnosis not present

## 2016-04-18 DIAGNOSIS — J449 Chronic obstructive pulmonary disease, unspecified: Secondary | ICD-10-CM | POA: Diagnosis not present

## 2016-04-18 DIAGNOSIS — A419 Sepsis, unspecified organism: Secondary | ICD-10-CM | POA: Diagnosis not present

## 2016-04-18 LAB — BASIC METABOLIC PANEL
ANION GAP: 3 — AB (ref 5–15)
BUN: 25 mg/dL — ABNORMAL HIGH (ref 6–20)
CALCIUM: 8.3 mg/dL — AB (ref 8.9–10.3)
CO2: 33 mmol/L — AB (ref 22–32)
CREATININE: 0.88 mg/dL (ref 0.61–1.24)
Chloride: 104 mmol/L (ref 101–111)
Glucose, Bld: 119 mg/dL — ABNORMAL HIGH (ref 65–99)
Potassium: 4.1 mmol/L (ref 3.5–5.1)
Sodium: 140 mmol/L (ref 135–145)

## 2016-04-18 LAB — GLUCOSE, CAPILLARY
Glucose-Capillary: 123 mg/dL — ABNORMAL HIGH (ref 65–99)
Glucose-Capillary: 101 mg/dL — ABNORMAL HIGH (ref 65–99)

## 2016-04-18 LAB — CBC
HEMATOCRIT: 36.8 % — AB (ref 40.0–52.0)
Hemoglobin: 12.5 g/dL — ABNORMAL LOW (ref 13.0–18.0)
MCH: 32.8 pg (ref 26.0–34.0)
MCHC: 34 g/dL (ref 32.0–36.0)
MCV: 96.3 fL (ref 80.0–100.0)
PLATELETS: 318 10*3/uL (ref 150–440)
RBC: 3.82 MIL/uL — ABNORMAL LOW (ref 4.40–5.90)
RDW: 15.7 % — AB (ref 11.5–14.5)
WBC: 18.6 10*3/uL — AB (ref 3.8–10.6)

## 2016-04-18 LAB — PROCALCITONIN

## 2016-04-18 MED ORDER — METOPROLOL TARTRATE 25 MG PO TABS: 25.0000 mg | ORAL_TABLET | Freq: Two times a day (BID) | ORAL | 0 refills | Status: AC

## 2016-04-18 MED ORDER — GUAIFENESIN-CODEINE 100-10 MG/5ML PO SOLN: 10.0000 mL | ORAL | 0 refills | Status: DC | PRN

## 2016-04-18 NOTE — Progress Notes (Signed)
SATURATION QUALIFICATIONS: (This note is used to comply with regulatory documentation for home oxygen)  Patient Saturations on Room Air at Rest = 95%  Patient Saturations on Room Air while Ambulating = 91%  Patient Saturations on 0 Liters of oxygen while Ambulating = NA  Please briefly explain why patient needs home oxygen:   Pt does not qualify because o2 saturations remain above 88% while ambulating.

## 2016-04-18 NOTE — Discharge Instructions (Signed)

## 2016-04-18 NOTE — Care Management (Signed)
Patient to discharge home today.  Patient does not qualify for home O2. Walker and nebulizer delivered to room by Corene Cornea with [facility].  Home health orders in place.  Corene Cornea with [facility] notified of discharge.  RNCM signing off

## 2016-04-18 NOTE — Progress Notes (Signed)
Patient oxygen saturations remained above 90% while ambulating Iv site removed. Discharge summary and prescription given. Pt resting in bed. Continue to assess.

## 2016-04-18 NOTE — Progress Notes (Signed)
Physical Therapy Treatment Patient Details Name: Stanley Rios. MRN: [id number] DOB: 1956-07-03 Today's Date: 04/18/2016    History of Present Illness Pt is a 60 y/o male presenting with flank pain, wheezing, coughing, and SOB. Pt admitted with community aquired pneumonia. PMH includes COPD, chronic neck pain, chronic back pain, anxiety, depression, HTN, and opiate abuse.    PT Comments    Pt steady with gait with RW 100' without LOB with SpO2 dropping to 89% on room air, increased to 91-92% after sitting < 60 sec.  Pt reports feeling better overall including decreased swelling in RUE and feet.  Pt reports quit smoking 12 days ago and hopes to quit permanently.  Smoking cessation education provided and pt reports has a health care provider in [place] that feels can be a good resource going forward to help towards that goal.  Dyspnea cycle and energy conservation education provided with principles of activity progression and RPE discussed.  Pt will benefit from continues PT services to address deficits in activity tolerance and for HEP implementation/reinforcement.      Follow Up Recommendations  Home health PT     Equipment Recommendations  Rolling walker with 5" wheels    Recommendations for Other Services       Precautions / Restrictions Precautions Precautions: None Restrictions Weight Bearing Restrictions: No    Mobility  Bed Mobility Overal bed mobility: Independent                Transfers Overall transfer level: Independent Equipment used: Rolling walker (2 wheeled) Transfers: Sit to/from Stand Sit to Stand: Independent            Ambulation/Gait Ambulation/Gait assistance: Supervision Ambulation Distance (Feet): 100 Feet Assistive device: Rolling walker (2 wheeled) Gait Pattern/deviations: WFL(Within Functional Limits)   Gait velocity interpretation: Below normal speed for age/gender General Gait Details: Steady with gait and  turns   Stairs Stairs:  (Pt reports no steps to enter home)          Wheelchair Mobility    Modified Rankin (Stroke Patients Only)       Balance Overall balance assessment: Modified Independent                                  Cognition Arousal/Alertness: Awake/alert Behavior During Therapy: WFL for tasks assessed/performed Overall Cognitive Status: Within Functional Limits for tasks assessed                      Exercises Total Joint Exercises Towel Squeeze: AROM;15 reps;Both Hip ABduction/ADduction: AROM;Both;10 reps Straight Leg Raises: AROM;Both;10 reps Long Arc Quad: Strengthening;Both;10 reps Knee Flexion: Strengthening;Both;10 reps    General Comments        Pertinent Vitals/Pain Pain Assessment: No/denies pain    Home Living                      Prior Function            PT Goals (current goals can now be found in the care plan section) Progress towards PT goals: Progressing toward goals    Frequency    Min 2X/week      PT Plan Current plan remains appropriate    Co-evaluation             End of Session Equipment Utilized During Treatment: Gait belt Activity Tolerance: Patient tolerated treatment well Patient left: in bed;with call  bell/phone within reach     Time: 1355-1425 PT Time Calculation (min) (ACUTE ONLY): 30 min  Charges:  $Therapeutic Exercise: 8-22 mins $Therapeutic Activity: 8-22 mins                    G Codes:      DRoyetta Asal PT, DPT 04/18/16, 3:54 PM

## 2016-04-21 ENCOUNTER — Ambulatory Visit
Admission: EM | Admit: 2016-04-21 | Discharge: 2016-04-21 | Disposition: A | Discharge status: Home / Self Care | Payer: PPO | Attending: Family Medicine | Admitting: Family Medicine

## 2016-04-21 ENCOUNTER — Other Ambulatory Visit: Payer: Self-pay

## 2016-04-21 DIAGNOSIS — Z8701 Personal history of pneumonia (recurrent): Secondary | ICD-10-CM

## 2016-04-21 DIAGNOSIS — M7989 Other specified soft tissue disorders: Secondary | ICD-10-CM

## 2016-04-21 DIAGNOSIS — I808 Phlebitis and thrombophlebitis of other sites: Secondary | ICD-10-CM | POA: Diagnosis not present

## 2016-04-21 DIAGNOSIS — B372 Candidiasis of skin and nail: Secondary | ICD-10-CM

## 2016-04-21 DIAGNOSIS — L22 Diaper dermatitis: Secondary | ICD-10-CM

## 2016-04-21 DIAGNOSIS — R609 Edema, unspecified: Secondary | ICD-10-CM

## 2016-04-21 MED ORDER — FUROSEMIDE 20 MG PO TABS: 20.0000 mg | ORAL_TABLET | Freq: Every day | ORAL | 0 refills | Status: DC

## 2016-04-21 MED ORDER — MUPIROCIN 2 % EX OINT
1.0000 | TOPICAL_OINTMENT | Freq: Three times a day (TID) | CUTANEOUS | 0 refills | Status: DC
Start: 2016-04-21 — End: 2016-08-05

## 2016-04-21 MED ORDER — POTASSIUM CHLORIDE CRYS ER 20 MEQ PO TBCR: 20.0000 meq | EXTENDED_RELEASE_TABLET | Freq: Every day | ORAL | 0 refills | Status: DC

## 2016-04-21 MED ORDER — ZINC OXIDE 20 % EX OINT: 1.0000 "application " | TOPICAL_OINTMENT | Freq: Four times a day (QID) | CUTANEOUS | 0 refills | Status: DC | PRN

## 2016-04-21 MED ORDER — KETOCONAZOLE 2 % EX CREA: 1.0000 "application " | TOPICAL_CREAM | Freq: Two times a day (BID) | CUTANEOUS | 0 refills | Status: DC

## 2016-04-21 NOTE — ED Triage Notes (Signed)
Patient presents with blisters on his right arm and bilateral ankle swelling. He was discharged from [facility] on 9/22, he was admitted for pneumonia.

## 2016-04-21 NOTE — Patient Outreach (Signed)
Transition of care call: Placed call to patient. No answer. Left a HIPPA compliant message requesting a call back.  PLAN: will await a call back.  If no response will try again to reach patient.  Tomasa Rand, RN, BSN, CEN [facility] [employer] [phone]

## 2016-04-21 NOTE — ED Provider Notes (Signed)
[facility]    CSN: [id number] Arrival date & time: 04/21/16  1546  First Provider Contact:  First MD Initiated Contact with Patient 04/21/16 1617        History   Chief Complaint Chief Complaint  Patient presents with  . Blister    Arms  . Leg Swelling    HPI Stanley Rios. is a 60 y.o. male.   Patient and his wife are here because of multiple problems. Apparently he was seen here earlier in the month while in the hospital with exacerbation of COPD and pneumonia. Along with the pneumonia has several medical problems. Apparently when he left the hospital she has several issues going to his wife everyone happy with would not address. First of all he is still smoking because I consider baking smoking electronic cigarette smoking. Not being compliant with sure the discharge orders and instructions. He is following his PCP and that this week. Face has swelling in his arm and which she felt were ulcerations in his right forearm she also reports swelling and redness of both feet that she is concerned about he's also had a red rash around his buttocks and ulcerations on his back. She states that he was in diapers she doesn't know how often his diapers were changed at the hospital. He has multiple medical problems including opiate dependency COPD now history pneumonia history of depression hyperlipidemia hypertension and anxiety. His wife smokes heavily and he smokes. No known drug allergies.   The history is provided by the patient and the spouse. No language interpreter was used.  Rash  Location:  Pelvis and shoulder/arm Shoulder/arm rash location:  R forearm Pelvic rash location:  L buttock, R buttock and gluteal cleft Quality: blistering and itchiness   Onset quality:  Unable to specify Progression:  Improving Chronicity:  New Context: diapers and sick contacts   Context: not eggs   Relieved by:  OTC analgesics Worsened by:  Nothing Ineffective treatments:   Topical steroids Associated symptoms: fatigue, myalgias and shortness of breath   Associated symptoms comment:  Edema of both feet   Past Medical History:  Diagnosis Date  . Anxiety   . Chronic back pain   . Chronic neck pain   . COPD (chronic obstructive pulmonary disease) ([place])   . Depression   . Hyperlipidemia   . Hypertension   . Opiate abuse, continuous     Patient Active Problem List   Diagnosis Date Noted  . Pressure injury of skin 04/15/2016  . DDD (degenerative disc disease), lumbar 03/27/2016  . Facet syndrome, lumbar 03/27/2016  . Sacroiliac joint dysfunction 03/27/2016  . DDD (degenerative disc disease), cervical 03/27/2016  . Cervical facet syndrome 03/27/2016  . Opiate withdrawal ([place]) 02/09/2016  . COPD (chronic obstructive pulmonary disease) ([place]) 02/09/2016  . HTN (hypertension) 02/09/2016  . Depression 02/09/2016  . Anxiety 02/09/2016  . Chronic pain 02/09/2016  . Neutrophilic leukocytosis 18/29/9371    Past Surgical History:  Procedure Laterality Date  . CHOLECYSTECTOMY    . NECK SURGERY  2002 approx   fusion, about 12 years ago  . SPINAL FUSION     ?? C3- C4       Home Medications    Prior to Admission medications   Medication Sig Start Date End Date Taking? Authorizing Provider  albuterol (PROVENTIL HFA;VENTOLIN HFA) 108 (90 Base) MCG/ACT inhaler Inhale 2 puffs into the lungs every 4 (four) hours as needed for wheezing or shortness of breath. Reported on 10/26/2015  Yes Historical Provider, MD  baclofen (LIORESAL) 10 MG tablet Take 10 mg by mouth 2 (two) times daily as needed for muscle spasms.    Yes Historical Provider, MD  buPROPion (WELLBUTRIN) 75 MG tablet Take 75 mg by mouth 2 (two) times daily. Reported on 10/26/2015   Yes Historical Provider, MD  celecoxib (CELEBREX) 200 MG capsule Take 200 mg by mouth daily.    Yes Historical Provider, MD  cyclobenzaprine (FLEXERIL) 10 MG tablet Take 10 mg by mouth 3 (three) times daily as needed for  muscle spasms.   Yes Historical Provider, MD  diclofenac sodium (VOLTAREN) 1 % GEL Apply 4 g topically 4 (four) times daily as needed (for pain).    Yes Historical Provider, MD  diltiazem (CARDIZEM SR) 90 MG 12 hr capsule Take 1 capsule (90 mg total) by mouth every 12 (twelve) hours. 04/09/16  Yes Sital Mody, MD  DULoxetine (CYMBALTA) 60 MG capsule Take 60 mg by mouth daily. Reported on 10/26/2015   Yes Historical Provider, MD  enalapril (VASOTEC) 5 MG tablet Take 5 mg by mouth daily.   Yes Historical Provider, MD  finasteride (PROSCAR) 5 MG tablet Take 5 mg by mouth daily.   Yes Historical Provider, MD  Fluticasone-Salmeterol (ADVAIR) 250-50 MCG/DOSE AEPB Inhale 1 puff into the lungs 2 (two) times daily. Reported on 10/26/2015   Yes Historical Provider, MD  gabapentin (NEURONTIN) 400 MG capsule Take 800 mg by mouth 3 (three) times daily.   Yes Historical Provider, MD  guaiFENesin-codeine 100-10 MG/5ML syrup Take 10 mLs by mouth every 4 (four) hours as needed for cough. 04/18/16  Yes Max Sane, MD  metoprolol tartrate (LOPRESSOR) 25 MG tablet Take 1 tablet (25 mg total) by mouth 2 (two) times daily. 04/18/16  Yes Vipul Manuella Ghazi, MD  omeprazole (PRILOSEC) 40 MG capsule Take 40 mg by mouth 2 (two) times daily.   Yes Historical Provider, MD  polyethylene glycol (MIRALAX / GLYCOLAX) packet Take 17 g by mouth daily.   Yes Historical Provider, MD  simvastatin (ZOCOR) 20 MG tablet Take 20 mg by mouth at bedtime.    Yes Historical Provider, MD  tamsulosin (FLOMAX) 0.4 MG CAPS capsule Take 0.4 mg by mouth daily.   Yes Historical Provider, MD  tiotropium (SPIRIVA) 18 MCG inhalation capsule Place 18 mcg into inhaler and inhale daily. Reported on 10/26/2015   Yes Historical Provider, MD  traZODone (DESYREL) 50 MG tablet Take 150 mg by mouth at bedtime.   Yes Historical Provider, MD  furosemide (LASIX) 20 MG tablet Take 1 tablet (20 mg total) by mouth daily. For 1 week and then when necessary as needed for swelling and  edema 04/21/16   Frederich Cha, MD  ketoconazole (NIZORAL) 2 % cream Apply 1 application topically 2 (two) times daily. 04/21/16   Frederich Cha, MD  lidocaine (XYLOCAINE) 5 % ointment Apply 1 application topically See admin instructions. 2 to 4 times daily    Historical Provider, MD  mupirocin ointment (BACTROBAN) 2 % Apply 1 application topically 3 (three) times daily. 04/21/16   Frederich Cha, MD  naloxone [facility]) 2 MG/2ML injection Inject 2 mLs into the muscle as needed. For side effect/adverse reaction/opioid overdose    Historical Provider, MD  potassium chloride SA (K-DUR,KLOR-CON) 20 MEQ tablet Take 1 tablet (20 mEq total) by mouth daily. When ever the furosemide is taken 04/21/16   Frederich Cha, MD  zinc oxide [facility] ZINC OXIDE) 20 % ointment Apply 1 application topically 4 (four) times daily as needed  for irritation. 04/21/16   Frederich Cha, MD    Family History Family History  Problem Relation Age of Onset  . Bladder Cancer Father     deceased    Social History Social History  Substance Use Topics  . Smoking status: Current Every Day Smoker    Packs/day: 0.50    Types: Cigarettes  . Smokeless tobacco: Never Used  . Alcohol use No     Allergies   Review of patient's allergies indicates no known allergies.   Review of Systems Review of Systems  Constitutional: Positive for fatigue.  Respiratory: Positive for shortness of breath.   Musculoskeletal: Positive for myalgias.  Skin: Positive for rash.  All other systems reviewed and are negative.    Physical Exam Triage Vital Signs ED Triage Vitals  Enc Vitals Group     BP 04/21/16 1605 115/73     Pulse Rate 04/21/16 1605 (!) 105     Resp 04/21/16 1605 20     Temp 04/21/16 1605 97.6 F (36.4 C)     Temp Source 04/21/16 1605 Oral     SpO2 04/21/16 1605 96 %     Weight 04/21/16 1605 186 lb (84.4 kg)     Height 04/21/16 1605 '5\' 11"'$  (1.803 m)     Head Circumference --      Peak Flow --      Pain Score 04/21/16 1606 8      Pain Loc --      Pain Edu? --      Excl. in [place]? --    No data found.   Updated Vital Signs BP 115/73 (BP Location: Left Arm)   Pulse (!) 105   Temp 97.6 F (36.4 C) (Oral)   Resp 20   Ht '5\' 11"'$  (1.803 m)   Wt 186 lb (84.4 kg)   SpO2 96%   BMI 25.94 kg/m   Visual Acuity Right Eye Distance:   Left Eye Distance:   Bilateral Distance:    Right Eye Near:   Left Eye Near:    Bilateral Near:     Physical Exam  Constitutional: Vital signs are normal. He is active. He appears toxic. He has a sickly appearance. He appears distressed.  Patient has questionable hygienic sweatpants and sweatshirt.  HENT:  Head: Normocephalic and atraumatic.  Eyes: Pupils are equal, round, and reactive to light.  Neck: Normal range of motion.  Cardiovascular: Normal rate, regular rhythm and normal heart sounds.   Pulmonary/Chest: Effort normal.  Musculoskeletal: Normal range of motion. He exhibits edema. He exhibits no deformity.       Right lower leg: He exhibits edema.       Left lower leg: He exhibits edema.       Legs: Neurological: He is alert.  Skin: Ecchymosis and rash noted. There is erythema.     Patient has horrible looking nails. Echo upset because the nails grow so long but these nails were going long before he went into the hospital Y they were not cut I cannot say. He also has ulceration on his back. This is a small ulceration he also has a ulceration on his right forearm but the arm appears be healing well. He is on Levaquin orally at this time as well. Now the buttocks he has a tinea infection that goes in the gluteal cleft involving both cheeks of the buttocks. The feet have the thick keratinized nail in both feet there is swelling 1+ edema of both the lower legs  and the feet and the feet are hyperemic pulses are intact  Psychiatric: He has a normal mood and affect.  Vitals reviewed.    UC Treatments / Results  Labs (all labs ordered are listed, but only abnormal results are  displayed) Labs Reviewed - No data to display  EKG  EKG Interpretation None       Radiology No results found.  Procedures Procedures (including critical care time)  Medications Ordered in UC Medications - No data to display   Initial Impression / Assessment and Plan / UC Course  I have reviewed the triage vital signs and the nursing notes.  Pertinent labs & imaging results that were available during my care of the patient were reviewed by me and considered in my medical decision making (see chart for details).  Clinical Course    Warfarin Bactroban ointment for the one ulcer ration on his right forearm and for the one on his back. Also for the fungal infection over the buttocks and the gluteal cleft Nizoral cream twice a day for 2 weeks and use zinc oxide 3-4 times a day as well. For the edema in both legs without place on Lasix 20 mg 1 tablet day 20 takes Lasix he needs to take potassium pills well after 1-2 weeks stop the Lasix daily and use on a when necessary basis. Follow-up with PCP on Friday. At the latest. Final Clinical Impressions(s) / UC Diagnoses   Final diagnoses:  Leg swelling  Peripheral edema  Candidal diaper dermatitis  Phlebitis and thrombophlebitis of deep veins of the upper extremities  Hx of bacterial pneumonia    New Prescriptions New Prescriptions   FUROSEMIDE (LASIX) 20 MG TABLET    Take 1 tablet (20 mg total) by mouth daily. For 1 week and then when necessary as needed for swelling and edema   KETOCONAZOLE (NIZORAL) 2 % CREAM    Apply 1 application topically 2 (two) times daily.   MUPIROCIN OINTMENT (BACTROBAN) 2 %    Apply 1 application topically 3 (three) times daily.   POTASSIUM CHLORIDE SA (K-DUR,KLOR-CON) 20 MEQ TABLET    Take 1 tablet (20 mEq total) by mouth daily. When ever the furosemide is taken   ZINC OXIDE (MEIJER ZINC OXIDE) 20 % OINTMENT    Apply 1 application topically 4 (four) times daily as needed for irritation.     Frederich Cha, MD 04/21/16 [phone]

## 2016-04-22 ENCOUNTER — Other Ambulatory Visit: Payer: Self-pay

## 2016-04-22 NOTE — Patient Outreach (Signed)
Transition of care: Placed call to patient who answered and reports he is doing well. Reports that he went to the urgent care yesterday for leg swelling and a rash. Reports he was given lasix and swelling is down some today. Reports rash is the same and he is applying his creams.  Patient reports that his breathing is good today. Reports pulse ox of 95%.  Patient reports that he has follow up planned with his primary MD on Friday. Wife reports that she will take patient to appointment. Patient reports that he would like me to speak with wife [name].  I spoke with patients wife who reports that patient needs a shower chair, wedge and an electric scooter.  Reviewed with wife that she would need to get the prescription from MD at office visit on Friday. Also reviewed with wife she could call customer service on the back of insurance card to inquire about co pays.  Reviewed with wife benefits of [employer]. Offered Education officer, museum referral and wife has accepted. Reports that she is recovering from cancer and may need assistance with transportation in the future.   Reviewed goals with wife and patient to avoid readmission if possible.  Outpatient Encounter Prescriptions as of 04/22/2016  Medication Sig  . albuterol (PROVENTIL HFA;VENTOLIN HFA) 108 (90 Base) MCG/ACT inhaler Inhale 2 puffs into the lungs every 4 (four) hours as needed for wheezing or shortness of breath. Reported on 10/26/2015  . baclofen (LIORESAL) 10 MG tablet Take 10 mg by mouth 2 (two) times daily as needed for muscle spasms.   Marland Kitchen buPROPion (WELLBUTRIN) 75 MG tablet Take 75 mg by mouth 2 (two) times daily. Reported on 10/26/2015  . celecoxib (CELEBREX) 200 MG capsule Take 200 mg by mouth daily.   . cyclobenzaprine (FLEXERIL) 10 MG tablet Take 10 mg by mouth 3 (three) times daily as needed for muscle spasms.  . diclofenac sodium (VOLTAREN) 1 % GEL Apply 4 g topically 4 (four) times daily as needed (for pain).   Marland Kitchen diltiazem (CARDIZEM SR) 90 MG 12 hr  capsule Take 1 capsule (90 mg total) by mouth every 12 (twelve) hours.  . DULoxetine (CYMBALTA) 60 MG capsule Take 60 mg by mouth daily. Reported on 10/26/2015  . enalapril (VASOTEC) 5 MG tablet Take 5 mg by mouth daily.  . finasteride (PROSCAR) 5 MG tablet Take 5 mg by mouth daily.  . Fluticasone-Salmeterol (ADVAIR) 250-50 MCG/DOSE AEPB Inhale 1 puff into the lungs 2 (two) times daily. Reported on 10/26/2015  . furosemide (LASIX) 20 MG tablet Take 1 tablet (20 mg total) by mouth daily. For 1 week and then when necessary as needed for swelling and edema  . gabapentin (NEURONTIN) 400 MG capsule Take 800 mg by mouth 3 (three) times daily.  Marland Kitchen ketoconazole (NIZORAL) 2 % cream Apply 1 application topically 2 (two) times daily.  Marland Kitchen lidocaine (XYLOCAINE) 5 % ointment Apply 1 application topically See admin instructions. 2 to 4 times daily  . metoprolol tartrate (LOPRESSOR) 25 MG tablet Take 1 tablet (25 mg total) by mouth 2 (two) times daily.  . mupirocin ointment (BACTROBAN) 2 % Apply 1 application topically 3 (three) times daily.  Marland Kitchen omeprazole (PRILOSEC) 40 MG capsule Take 40 mg by mouth 2 (two) times daily.  . polyethylene glycol (MIRALAX / GLYCOLAX) packet Take 17 g by mouth daily.  . potassium chloride SA (K-DUR,KLOR-CON) 20 MEQ tablet Take 1 tablet (20 mEq total) by mouth daily. When ever the furosemide is taken  . simvastatin (ZOCOR)  20 MG tablet Take 20 mg by mouth at bedtime.   . tamsulosin (FLOMAX) 0.4 MG CAPS capsule Take 0.4 mg by mouth daily.  Marland Kitchen tiotropium (SPIRIVA) 18 MCG inhalation capsule Place 18 mcg into inhaler and inhale daily. Reported on 10/26/2015  . traZODone (DESYREL) 50 MG tablet Take 150 mg by mouth at bedtime.  Marland Kitchen zinc oxide (MEIJER ZINC OXIDE) 20 % ointment Apply 1 application topically 4 (four) times daily as needed for irritation.  Marland Kitchen guaiFENesin-codeine 100-10 MG/5ML syrup Take 10 mLs by mouth every 4 (four) hours as needed for cough.  . naloxone (NARCAN) 2 MG/2ML injection  Inject 2 mLs into the muscle as needed. For side effect/adverse reaction/opioid overdose   No facility-administered encounter medications on file as of 04/22/2016.    Plan: Provided wife with assigned case manager name Landis Martins and informed wife that Landis Martins would call next week.  I also explained to wife if she or patient needed anything this week to call me. Reviewed transition of care program with weekly outreach calls and or visits.   Will send this note to MD. Will send barrier letter.  Will provide update to assigned case manager.  Reviewed with wife if patients conditions gets worse to call MD.   [facility] CM Care Plan Problem One   Flowsheet Row Most Recent Value  Care Plan Problem One  Recent admission related pneumonia   Role Documenting the Problem One  Care Management [place] for Problem One  Active  [facility] Long Term Goal (31-90 days)  Patient will verbalize no readmissions in the next 31days.   [facility] Long Term Goal Start Date  04/22/16  Interventions for Problem One Long Term Goal  Reviewed transition of care program with patient and wife. Provided contact information.  Reviewed discharge instructions.   [facility] CM Short Term Goal #1 (0-30 days)  Patient will verbalize attending followup appointment with primary MD in 7 days,  [facility] CM Short Term Goal #1 Start Date  04/22/16  Interventions for Short Term Goal #1  reviewed importance of timely follow up. Confirmed that wife will drive patient to MD appointment.  [facility] CM Short Term Goal #2 (0-30 days)  Patient will report decrease swelling to legs in the next 7 days.  [facility] CM Short Term Goal #2 Start Date  04/22/16  Interventions for Short Term Goal #2  Reviewed importance of calling MD for worsening symptoms or fever.     Tomasa Rand, RN, BSN, CEN [facility] [employer] [phone]

## 2016-04-22 NOTE — Addendum Note (Signed)
Addended by: Thana Ates on: 04/22/2016 12:28 PM   Modules accepted: Orders

## 2016-04-23 ENCOUNTER — Other Ambulatory Visit: Payer: Self-pay | Admitting: *Deleted

## 2016-04-23 NOTE — Progress Notes (Signed)
[facility]  Patient Status: Closed, patient had cancelled services the day after discharge, then called back to restart services. After several attempts to schedule visit, nurse reached patient and he declined services on 9/26. Stated he was doing fine at this time.       Florene Glen 04/23/2016, 11:56 AM

## 2016-04-23 NOTE — Patient Outreach (Signed)
[facility]) Care Management  04/23/2016  Stanley Rios. May 22, 1956 [id number]  RNCM referred this patient to social work to provide patient with community resources to address patient's DME needs and increasing medical bills.  Patient gave this social worker permission to speak to his spouse who stated that when patient came home from the hospital, they cancelled home health services because they could not afford the co-pay.  Per patient's spouse, patient needs a shower chair and electric wheelchair. Per patient's spouse, she has previous training as a CNA, however can only do so much due to her own medical issues and they are need of assistance.  Plan:  This Education officer, museum will discuss case with RNCM.              Home visit to be scheduled next week following              collaboration phone call with RNCM.    [name] [facility] Care Management [phone]

## 2016-04-25 DIAGNOSIS — K469 Unspecified abdominal hernia without obstruction or gangrene: Secondary | ICD-10-CM | POA: Diagnosis not present

## 2016-04-25 DIAGNOSIS — B354 Tinea corporis: Secondary | ICD-10-CM | POA: Diagnosis not present

## 2016-04-28 ENCOUNTER — Other Ambulatory Visit: Payer: Self-pay | Admitting: *Deleted

## 2016-04-28 ENCOUNTER — Encounter: Payer: Self-pay | Admitting: *Deleted

## 2016-04-28 NOTE — Patient Outreach (Signed)
[facility]) Care Management  04/28/2016  Stanley Rios. 1956-05-22 [id number]   Transition of care call  Placed call to [name], able to speak with his wife that is on [facility] consent, HIPAA verified. [name] reports patient is resting and that she just finished helping him with his bath. Caregiver reports patient's breathing was okay today , just a little worn out after bath.  [name] reports some decreased in swelling to legs.   [name] discussed equipment needs regarding a wheel chair, shower chair and asking about gloves for the cream that she is having to apply to broken skin areas on bottom related to fungal infections she states.  [name] discussed that they are unable to afford the co payment home health services. Discussed home visit with social worker to assess and assist with needs  and she is agreeable   [name] discussed that patient currently using vapor cigarette but today will be there last day smoking, she is planning on getting nicoderm patches to use.    Plan Will schedule co home visit with Chrystal Land , THN LCSW on this week as part of weekly transition of care outreach.    [facility] CM Care Plan Problem One   Flowsheet Row Most Recent Value  Care Plan Problem One  Recent admission related pneumonia   Role Documenting the Problem One  Care Management [place] for Problem One  Active  THN Long Term Goal (31-90 days)  Patient will verbalize no readmissions in the next 31days.   THN Long Term Goal Start Date  04/22/16  Interventions for Problem One Long Term Goal  home visit scheduled as part of transition of care,   THN CM Short Term Goal #1 (0-30 days)  Patient will verbalize attending followup appointment with primary MD in 7 days,  THN CM Short Term Goal #1 Start Date  04/22/16  [facility] CM Short Term Goal #1 Met Date  04/28/16  THN CM Short Term Goal #2 (0-30 days)  Patient will report decrease swelling to legs in the  next 7 days.  THN CM Short Term Goal #2 Start Date  04/22/16  Interventions for Short Term Goal #2  reinforced with caregiver to notify MD of worsening symptoms ,  breathing problems       [name], RN, [place] Care Management [phone]- Mobile [phone]- [place]

## 2016-05-01 ENCOUNTER — Other Ambulatory Visit: Payer: Self-pay | Admitting: *Deleted

## 2016-05-01 ENCOUNTER — Encounter: Payer: Self-pay | Admitting: *Deleted

## 2016-05-01 NOTE — Patient Outreach (Signed)
[facility]) Care Management   05/01/2016  Stanley Rios. [birth year] [id number]  Stanley Rios. is an 60 y.o. male  Subjective:   Mr.Reidel reports breathing is at baseline, complaints of pain at left lateral abdominal area points to bulging area. Patient and wife discussed patient new diagnosis of on recent CT scan that reveals hernia at chest/abdominal area. Patient discussed concern regarding the possibility of needing surgery and awaiting appointment with thoracic surgeon at  [facility].   Patient and Mrs.Bir discussed that patient is using a borrowed concentrator , with oxygen at 2 liters at times, usually at night. Patient denies any increase in shortness of breath reports taking medications as prescribed.  Mrs.Frisch reports patient  room air saturations have been 89 to 90's. Caregiver discussed patient did not qualify for oxygen at discharge from the hospital did receive nebulizer .Patient reports decreased cough.   Patient and wife discussed concern regarding red rash to buttock area and bilateral feet/lower legs swelling as decreased and patient no longer taking lasix.   Patient and wife state they are managing okay  at home, patient able to tolerate activity in home, using her walker reports they where unable to afford co payment for home therapy. Patient and wife interested in home equipment of shower chair, and electric wheelchair.    Objective:  Pulse 100   Resp 18   SpO2 96%   Patient resting on sofa, dressed comfortably,  rolling walker in reach Review of Systems  Constitutional: Negative.   HENT: Negative.   Eyes: Negative.   Respiratory: Negative for wheezing.   Cardiovascular: Positive for leg swelling.       Edema bilateral feet  Gastrointestinal: Negative.   Genitourinary: Negative.   Musculoskeletal: Positive for back pain.  Neurological: Negative.   Psychiatric/Behavioral: Positive for depression.    Physical Exam    Constitutional: He is oriented to person, place, and time. He appears well-developed and well-nourished.  Cardiovascular: Normal rate, normal heart sounds and intact distal pulses.   Respiratory: Effort normal and breath sounds normal. He has no wheezes.  Bulging noted at left lateral abdomen   GI: Soft. Bowel sounds are normal.    Neurological: He is alert and oriented to person, place, and time.  Skin: Skin is warm and dry.     Redness to buttock areas, skin intact,   Psychiatric: He has a normal mood and affect. His behavior is normal. Judgment and thought content normal.    Encounter Medications:   Outpatient Encounter Prescriptions as of 05/01/2016  Medication Sig Note  . albuterol (PROVENTIL HFA;VENTOLIN HFA) 108 (90 Base) MCG/ACT inhaler Inhale 2 puffs into the lungs every 4 (four) hours as needed for wheezing or shortness of breath. Reported on 10/26/2015   . buPROPion (WELLBUTRIN) 75 MG tablet Take 75 mg by mouth 2 (two) times daily. Reported on 10/26/2015   . celecoxib (CELEBREX) 200 MG capsule Take 200 mg by mouth daily.    . clotrimazole-betamethasone (LOTRISONE) cream Apply 1 application topically 2 (two) times daily. To sacral area   . cyclobenzaprine (FLEXERIL) 10 MG tablet Take 10 mg by mouth 3 (three) times daily as needed for muscle spasms.   . diclofenac sodium (VOLTAREN) 1 % GEL Apply 4 g topically 4 (four) times daily as needed (for pain).    Marland Kitchen diltiazem (CARDIZEM SR) 90 MG 12 hr capsule Take 1 capsule (90 mg total) by mouth every 12 (twelve) hours.   . DULoxetine (CYMBALTA) 60  MG capsule Take 60 mg by mouth daily. Reported on 10/26/2015   . finasteride (PROSCAR) 5 MG tablet Take 5 mg by mouth daily.   . Fluticasone-Salmeterol (ADVAIR) 250-50 MCG/DOSE AEPB Inhale 1 puff into the lungs 2 (two) times daily. Reported on 10/26/2015   . furosemide (LASIX) 20 MG tablet Take 1 tablet (20 mg total) by mouth daily. For 1 week and then when necessary as needed for swelling and  edema   . gabapentin (NEURONTIN) 400 MG capsule Take 800 mg by mouth 3 (three) times daily.   Marland Kitchen ipratropium-albuterol (DUONEB) 0.5-2.5 (3) MG/3ML SOLN Take 3 mLs by nebulization every 6 (six) hours as needed.   . lidocaine (XYLOCAINE) 5 % ointment Apply 1 application topically See admin instructions. 2 to 4 times daily   . metoprolol tartrate (LOPRESSOR) 25 MG tablet Take 1 tablet (25 mg total) by mouth 2 (two) times daily.   Marland Kitchen omeprazole (PRILOSEC) 40 MG capsule Take 40 mg by mouth 2 (two) times daily.   . polyethylene glycol (MIRALAX / GLYCOLAX) packet Take 17 g by mouth daily.   . potassium chloride SA (K-DUR,KLOR-CON) 20 MEQ tablet Take 1 tablet (20 mEq total) by mouth daily. When ever the furosemide is taken   . simvastatin (ZOCOR) 20 MG tablet Take 20 mg by mouth at bedtime.    . tamsulosin (FLOMAX) 0.4 MG CAPS capsule Take 0.4 mg by mouth daily.   Marland Kitchen tiotropium (SPIRIVA) 18 MCG inhalation capsule Place 18 mcg into inhaler and inhale daily. Reported on 10/26/2015   . TRAMADOL HCL PO Take 50 mg by mouth every 6 (six) hours as needed.   . traZODone (DESYREL) 50 MG tablet Take 150 mg by mouth at bedtime.   Marland Kitchen zinc oxide (MEIJER ZINC OXIDE) 20 % ointment Apply 1 application topically 4 (four) times daily as needed for irritation.   . baclofen (LIORESAL) 10 MG tablet Take 10 mg by mouth 2 (two) times daily as needed for muscle spasms.  05/01/2016: Not taking  . enalapril (VASOTEC) 5 MG tablet Take 5 mg by mouth daily. 05/01/2016: Not taking  . guaiFENesin-codeine 100-10 MG/5ML syrup Take 10 mLs by mouth every 4 (four) hours as needed for cough. (Patient not taking: Reported on 05/01/2016) 05/01/2016: Not taking  . ipratropium (ATROVENT HFA) 17 MCG/ACT inhaler Inhale 2 puffs into the lungs every 6 (six) hours.   Marland Kitchen ketoconazole (NIZORAL) 2 % cream Apply 1 application topically 2 (two) times daily. (Patient not taking: Reported on 05/01/2016)   . mupirocin ointment (BACTROBAN) 2 % Apply 1 application  topically 3 (three) times daily. (Patient not taking: Reported on 05/01/2016) 05/01/2016: Not taking  . naloxone (NARCAN) 2 MG/2ML injection Inject 2 mLs into the muscle as needed. For side effect/adverse reaction/opioid overdose 05/01/2016: Not taking   No facility-administered encounter medications on file as of 05/01/2016.     Functional Status:   In your present state of health, do you have any difficulty performing the following activities: 04/28/2016 04/06/2016  Hearing? N N  Vision? N N  Difficulty concentrating or making decisions? N N  Walking or climbing stairs? Y N  Dressing or bathing? Y N  Doing errands, shopping? Y N  Preparing Food and eating ? Y -  In the past six months, have you accidently leaked urine? Y -  Do you have problems with loss of bowel control? N -  Managing your Medications? N -  Managing your Finances? Y -  Housekeeping or managing your Housekeeping? Y -  Some recent data might be hidden    Fall/Depression Screening:    PHQ 2/9 Scores 05/01/2016 03/27/2016  PHQ - 2 Score 2 0  PHQ- 9 Score 3 -   Fall Risk  05/01/2016 05/01/2016 03/27/2016  Falls in the past year? - No No  Risk for fall due to : (No Data) Impaired balance/gait -  Risk for fall due to (comments): uses rolling walker fall prevention discussed -   Assessment:  Home Visit with Chrystal Land, LCSW and Mrs.Befort present.  Concern regarding need for possible surgery -  Awaiting call from [facility] regarding follow up . Wife able to provide transportation.   Hx COPD,Recent Pneumonia - breathing at baseline, decrease in cough by report. using respiratory medications as prescribed.Denies having difficulty purchasing medications.  Has follow up appointment with Pulmonary MD on 10/11, has seen PCP since hospital discharge Patient currently using vapor cigarette, and has a planned quit date for smoking on 10/8, has already purchased nicotine patch. Mrs.Grine to discuss home oxygen need at pulmonary  office visit. Discussed safety concerns regarding using borrowed oxygen concentrator. Reinforced importance of notifying MD worsening symptoms . Patient/wife able to verbalize use of rescue inhaler  .  Fall Risk - High Fall Risk- reinforced use of walker, will benefit from shower chair for safety and to preserve energy during bath.Patient requesting a electric wheel chair.   Depression screen positive - declines need for counseling,continuing current medication plan.    Plan:  Patient will attend all upcoming appointments. Patient/caregiver will notify MD of worsening symptoms and 911 for emergency. Patient will continue to use his walker at all times for fall prevention, reviewed fall prevention handout,THN packet. Will continue with weekly outreaches as part of the transition of care program.    [facility] CM Care Plan Problem One   Flowsheet Row Most Recent Value  Care Plan Problem One  Recent admission related pneumonia   Role Documenting the Problem One  Care Management [place] for Problem One  Active  THN Long Term Goal (31-90 days)  Patient will verbalize no readmissions in the next 31days.   THN Long Term Goal Start Date  04/22/16  Interventions for Problem One Long Term Goal  Discussed with patient and wife importance of notifying MD sooner of new or worsening symptoms   THN CM Short Term Goal #1 (0-30 days)  Patient will verbalize attending followup appointment with primary MD in 7 days,  THN CM Short Term Goal #1 Start Date  04/22/16  [facility] CM Short Term Goal #1 Met Date  04/28/16  THN CM Short Term Goal #2 (0-30 days)  Patient will report decrease swelling to legs in the next 7 days.  THN CM Short Term Goal #2 Start Date  04/22/16  THN CM Short Term Goal #2 Met Date  05/01/16  THN CM Short Term Goal #3 (0-30 days)  Patient will be able to states worsenig symptoms to notify MD of in the next 20 days   THN CM Short Term Goal #3 Start Date  05/01/16  Interventions for  Short Tern Goal #3  Provided and reviewed copd magnet and yellow zone symptoms      [name], RN, [place] Management [phone]- Mobile [phone]- [place]

## 2016-05-01 NOTE — Patient Outreach (Signed)
[facility]) Care Management  [facility] Social Work  05/01/2016  Stanley Rios. [birth year] [id number]  Subjective:  Patient is a 60 year old male.  fObjective:   Encounter Medications:  Outpatient Encounter Prescriptions as of 05/01/2016  Medication Sig  . albuterol (PROVENTIL HFA;VENTOLIN HFA) 108 (90 Base) MCG/ACT inhaler Inhale 2 puffs into the lungs every 4 (four) hours as needed for wheezing or shortness of breath. Reported on 10/26/2015  . baclofen (LIORESAL) 10 MG tablet Take 10 mg by mouth 2 (two) times daily as needed for muscle spasms.   Marland Kitchen buPROPion (WELLBUTRIN) 75 MG tablet Take 75 mg by mouth 2 (two) times daily. Reported on 10/26/2015  . celecoxib (CELEBREX) 200 MG capsule Take 200 mg by mouth daily.   . cyclobenzaprine (FLEXERIL) 10 MG tablet Take 10 mg by mouth 3 (three) times daily as needed for muscle spasms.  . diclofenac sodium (VOLTAREN) 1 % GEL Apply 4 g topically 4 (four) times daily as needed (for pain).   Marland Kitchen diltiazem (CARDIZEM SR) 90 MG 12 hr capsule Take 1 capsule (90 mg total) by mouth every 12 (twelve) hours.  . DULoxetine (CYMBALTA) 60 MG capsule Take 60 mg by mouth daily. Reported on 10/26/2015  . enalapril (VASOTEC) 5 MG tablet Take 5 mg by mouth daily.  . finasteride (PROSCAR) 5 MG tablet Take 5 mg by mouth daily.  . Fluticasone-Salmeterol (ADVAIR) 250-50 MCG/DOSE AEPB Inhale 1 puff into the lungs 2 (two) times daily. Reported on 10/26/2015  . furosemide (LASIX) 20 MG tablet Take 1 tablet (20 mg total) by mouth daily. For 1 week and then when necessary as needed for swelling and edema  . gabapentin (NEURONTIN) 400 MG capsule Take 800 mg by mouth 3 (three) times daily.  Marland Kitchen guaiFENesin-codeine 100-10 MG/5ML syrup Take 10 mLs by mouth every 4 (four) hours as needed for cough.  Marland Kitchen ketoconazole (NIZORAL) 2 % cream Apply 1 application topically 2 (two) times daily.  Marland Kitchen lidocaine (XYLOCAINE) 5 % ointment Apply 1 application topically See admin  instructions. 2 to 4 times daily  . metoprolol tartrate (LOPRESSOR) 25 MG tablet Take 1 tablet (25 mg total) by mouth 2 (two) times daily.  . mupirocin ointment (BACTROBAN) 2 % Apply 1 application topically 3 (three) times daily.  . naloxone (NARCAN) 2 MG/2ML injection Inject 2 mLs into the muscle as needed. For side effect/adverse reaction/opioid overdose  . omeprazole (PRILOSEC) 40 MG capsule Take 40 mg by mouth 2 (two) times daily.  . polyethylene glycol (MIRALAX / GLYCOLAX) packet Take 17 g by mouth daily.  . potassium chloride SA (K-DUR,KLOR-CON) 20 MEQ tablet Take 1 tablet (20 mEq total) by mouth daily. When ever the furosemide is taken  . simvastatin (ZOCOR) 20 MG tablet Take 20 mg by mouth at bedtime.   . tamsulosin (FLOMAX) 0.4 MG CAPS capsule Take 0.4 mg by mouth daily.  Marland Kitchen tiotropium (SPIRIVA) 18 MCG inhalation capsule Place 18 mcg into inhaler and inhale daily. Reported on 10/26/2015  . traZODone (DESYREL) 50 MG tablet Take 150 mg by mouth at bedtime.  Marland Kitchen zinc oxide (MEIJER ZINC OXIDE) 20 % ointment Apply 1 application topically 4 (four) times daily as needed for irritation.   No facility-administered encounter medications on file as of 05/01/2016.     Functional Status:  In your present state of health, do you have any difficulty performing the following activities: 04/28/2016 04/06/2016  Hearing? N N  Vision? N N  Difficulty concentrating or making decisions? N  N  Walking or climbing stairs? Y N  Dressing or bathing? Y N  Doing errands, shopping? Y N  Preparing Food and eating ? Y -  In the past six months, have you accidently leaked urine? Y -  Do you have problems with loss of bowel control? N -  Managing your Medications? N -  Managing your Finances? Y -  Housekeeping or managing your Housekeeping? Y -  Some recent data might be hidden    Fall/Depression Screening:  PHQ 2/9 Scores 03/27/2016  PHQ - 2 Score 0    Assessment: Co-visit with RNCM Landis Martins.  Patient saw  primary care doctor on Friday, report's having  2 hernia's 1 near the chest and 1 near diaphram.  Patient has been referred to a thoracic surgeon. Per patient, they are waiting on a phone call back from the surgeon in [place] to discuss further steps.   Patient has difficulty with mobility and is in need of a shower chair.  Patient picked up one from a yard sale that had mildew and was unsteady.  Spouse currently stays close to patient while he showers to avoid falls, but would like to get a functioning chair as soon as possible.  Patient also requesting assistance with a wheelchair. Patient would like a hover around, however cannot afford it and would be okay with an electric wheelchair.  It was explained that patient would need a wheelchair assessment from his primary care doctor to initiate the eligibility process.  Patient's wife discussed that they are in financial difficulty. Per patient's spouse, they do not qualify for section 8, food stamps or disability, due to their income and assets, although minimal.   Patient describes having 3 children, 1 Son that lives in [place] very  supportive and visits one to two time a week to check on them.  This social worker encouraged patient to contact the hospitals business office to discuss re-payment of medical bills.  Per patient, food resources are not a problem at this time.    Plan: This Education officer, museum will contact patient's provider's office regarding the need for a wheelchair assessment.  Financial form completed to assess eligibility for assistance through the [place] for needed DME.     [name] [facility] Care Management [phone]

## 2016-05-02 ENCOUNTER — Encounter: Payer: Self-pay | Admitting: *Deleted

## 2016-05-06 DIAGNOSIS — K219 Gastro-esophageal reflux disease without esophagitis: Secondary | ICD-10-CM | POA: Diagnosis not present

## 2016-05-06 DIAGNOSIS — J439 Emphysema, unspecified: Secondary | ICD-10-CM | POA: Diagnosis not present

## 2016-05-06 DIAGNOSIS — G894 Chronic pain syndrome: Secondary | ICD-10-CM | POA: Diagnosis not present

## 2016-05-06 DIAGNOSIS — Z7951 Long term (current) use of inhaled steroids: Secondary | ICD-10-CM | POA: Diagnosis not present

## 2016-05-06 DIAGNOSIS — H04129 Dry eye syndrome of unspecified lacrimal gland: Secondary | ICD-10-CM | POA: Diagnosis not present

## 2016-05-06 DIAGNOSIS — F17210 Nicotine dependence, cigarettes, uncomplicated: Secondary | ICD-10-CM | POA: Diagnosis not present

## 2016-05-06 DIAGNOSIS — K589 Irritable bowel syndrome without diarrhea: Secondary | ICD-10-CM | POA: Diagnosis not present

## 2016-05-06 DIAGNOSIS — M4802 Spinal stenosis, cervical region: Secondary | ICD-10-CM | POA: Diagnosis not present

## 2016-05-06 DIAGNOSIS — I10 Essential (primary) hypertension: Secondary | ICD-10-CM | POA: Diagnosis not present

## 2016-05-06 DIAGNOSIS — G4700 Insomnia, unspecified: Secondary | ICD-10-CM | POA: Diagnosis not present

## 2016-05-06 DIAGNOSIS — H43821 Vitreomacular adhesion, right eye: Secondary | ICD-10-CM | POA: Diagnosis not present

## 2016-05-06 DIAGNOSIS — Z9849 Cataract extraction status, unspecified eye: Secondary | ICD-10-CM | POA: Diagnosis not present

## 2016-05-06 DIAGNOSIS — F329 Major depressive disorder, single episode, unspecified: Secondary | ICD-10-CM | POA: Diagnosis not present

## 2016-05-06 DIAGNOSIS — Z8249 Family history of ischemic heart disease and other diseases of the circulatory system: Secondary | ICD-10-CM | POA: Diagnosis not present

## 2016-05-06 DIAGNOSIS — Z79899 Other long term (current) drug therapy: Secondary | ICD-10-CM | POA: Diagnosis not present

## 2016-05-06 DIAGNOSIS — M961 Postlaminectomy syndrome, not elsewhere classified: Secondary | ICD-10-CM | POA: Diagnosis not present

## 2016-05-06 DIAGNOSIS — K458 Other specified abdominal hernia without obstruction or gangrene: Secondary | ICD-10-CM | POA: Diagnosis not present

## 2016-05-06 DIAGNOSIS — Z809 Family history of malignant neoplasm, unspecified: Secondary | ICD-10-CM | POA: Diagnosis not present

## 2016-05-06 DIAGNOSIS — Z981 Arthrodesis status: Secondary | ICD-10-CM | POA: Diagnosis not present

## 2016-05-06 DIAGNOSIS — M1990 Unspecified osteoarthritis, unspecified site: Secondary | ICD-10-CM | POA: Diagnosis not present

## 2016-05-06 DIAGNOSIS — E559 Vitamin D deficiency, unspecified: Secondary | ICD-10-CM | POA: Diagnosis not present

## 2016-05-06 DIAGNOSIS — L821 Other seborrheic keratosis: Secondary | ICD-10-CM | POA: Diagnosis not present

## 2016-05-08 ENCOUNTER — Other Ambulatory Visit: Payer: Self-pay | Admitting: *Deleted

## 2016-05-08 DIAGNOSIS — Z9889 Other specified postprocedural states: Secondary | ICD-10-CM | POA: Diagnosis not present

## 2016-05-08 DIAGNOSIS — E785 Hyperlipidemia, unspecified: Secondary | ICD-10-CM | POA: Diagnosis not present

## 2016-05-08 DIAGNOSIS — Z7951 Long term (current) use of inhaled steroids: Secondary | ICD-10-CM | POA: Diagnosis not present

## 2016-05-08 DIAGNOSIS — J9811 Atelectasis: Secondary | ICD-10-CM | POA: Diagnosis not present

## 2016-05-08 DIAGNOSIS — M961 Postlaminectomy syndrome, not elsewhere classified: Secondary | ICD-10-CM | POA: Diagnosis not present

## 2016-05-08 DIAGNOSIS — J449 Chronic obstructive pulmonary disease, unspecified: Secondary | ICD-10-CM | POA: Diagnosis not present

## 2016-05-08 DIAGNOSIS — Y939 Activity, unspecified: Secondary | ICD-10-CM | POA: Diagnosis not present

## 2016-05-08 DIAGNOSIS — X58XXXA Exposure to other specified factors, initial encounter: Secondary | ICD-10-CM | POA: Diagnosis not present

## 2016-05-08 DIAGNOSIS — G894 Chronic pain syndrome: Secondary | ICD-10-CM | POA: Diagnosis not present

## 2016-05-08 DIAGNOSIS — N400 Enlarged prostate without lower urinary tract symptoms: Secondary | ICD-10-CM | POA: Diagnosis not present

## 2016-05-08 DIAGNOSIS — Z8701 Personal history of pneumonia (recurrent): Secondary | ICD-10-CM | POA: Diagnosis not present

## 2016-05-08 DIAGNOSIS — K449 Diaphragmatic hernia without obstruction or gangrene: Secondary | ICD-10-CM | POA: Diagnosis not present

## 2016-05-08 DIAGNOSIS — E559 Vitamin D deficiency, unspecified: Secondary | ICD-10-CM | POA: Diagnosis not present

## 2016-05-08 DIAGNOSIS — Z7952 Long term (current) use of systemic steroids: Secondary | ICD-10-CM | POA: Diagnosis not present

## 2016-05-08 DIAGNOSIS — F17210 Nicotine dependence, cigarettes, uncomplicated: Secondary | ICD-10-CM | POA: Diagnosis not present

## 2016-05-08 DIAGNOSIS — I10 Essential (primary) hypertension: Secondary | ICD-10-CM | POA: Diagnosis not present

## 2016-05-08 DIAGNOSIS — I959 Hypotension, unspecified: Secondary | ICD-10-CM | POA: Diagnosis not present

## 2016-05-08 DIAGNOSIS — S2232XA Fracture of one rib, left side, initial encounter for closed fracture: Secondary | ICD-10-CM | POA: Diagnosis not present

## 2016-05-08 DIAGNOSIS — J948 Other specified pleural conditions: Secondary | ICD-10-CM | POA: Diagnosis not present

## 2016-05-08 DIAGNOSIS — J984 Other disorders of lung: Secondary | ICD-10-CM | POA: Diagnosis not present

## 2016-05-08 DIAGNOSIS — G8918 Other acute postprocedural pain: Secondary | ICD-10-CM | POA: Diagnosis not present

## 2016-05-08 DIAGNOSIS — Z79899 Other long term (current) drug therapy: Secondary | ICD-10-CM | POA: Diagnosis not present

## 2016-05-08 DIAGNOSIS — M4802 Spinal stenosis, cervical region: Secondary | ICD-10-CM | POA: Diagnosis not present

## 2016-05-08 DIAGNOSIS — Z4682 Encounter for fitting and adjustment of non-vascular catheter: Secondary | ICD-10-CM | POA: Diagnosis not present

## 2016-05-08 NOTE — Patient Outreach (Signed)
[place] [facility]) Care Management  05/08/2016  Jarrid Lienhard. 1955/09/05 [id number]   Placed transition of care call  to Mr.Sabic, able to speak with his wife on [facility] consent, HIPAA verified. Wife states patient is in surgery today for hernia surgery and anticipates at least a 5 day hospital stay.   Plan Will plan follow up call in one week  Will discuss with [facility], LCSW, that has follow up visit for 10/16.   [name], RN, [place] Management [phone]- Mobile [phone]- Toll Free Main Office

## 2016-05-09 DIAGNOSIS — Z9889 Other specified postprocedural states: Secondary | ICD-10-CM | POA: Diagnosis not present

## 2016-05-09 DIAGNOSIS — R918 Other nonspecific abnormal finding of lung field: Secondary | ICD-10-CM | POA: Diagnosis not present

## 2016-05-09 DIAGNOSIS — Z4682 Encounter for fitting and adjustment of non-vascular catheter: Secondary | ICD-10-CM | POA: Diagnosis not present

## 2016-05-09 DIAGNOSIS — R0789 Other chest pain: Secondary | ICD-10-CM | POA: Diagnosis not present

## 2016-05-09 DIAGNOSIS — G8918 Other acute postprocedural pain: Secondary | ICD-10-CM | POA: Diagnosis not present

## 2016-05-10 DIAGNOSIS — K458 Other specified abdominal hernia without obstruction or gangrene: Secondary | ICD-10-CM | POA: Diagnosis not present

## 2016-05-10 DIAGNOSIS — J939 Pneumothorax, unspecified: Secondary | ICD-10-CM | POA: Diagnosis not present

## 2016-05-10 DIAGNOSIS — R918 Other nonspecific abnormal finding of lung field: Secondary | ICD-10-CM | POA: Diagnosis not present

## 2016-05-10 DIAGNOSIS — Z9889 Other specified postprocedural states: Secondary | ICD-10-CM | POA: Diagnosis not present

## 2016-05-10 DIAGNOSIS — G8918 Other acute postprocedural pain: Secondary | ICD-10-CM | POA: Diagnosis not present

## 2016-05-10 DIAGNOSIS — J90 Pleural effusion, not elsewhere classified: Secondary | ICD-10-CM | POA: Diagnosis not present

## 2016-05-10 DIAGNOSIS — R0789 Other chest pain: Secondary | ICD-10-CM | POA: Diagnosis not present

## 2016-05-10 DIAGNOSIS — Z4682 Encounter for fitting and adjustment of non-vascular catheter: Secondary | ICD-10-CM | POA: Diagnosis not present

## 2016-05-11 DIAGNOSIS — R0789 Other chest pain: Secondary | ICD-10-CM | POA: Diagnosis not present

## 2016-05-11 DIAGNOSIS — G8918 Other acute postprocedural pain: Secondary | ICD-10-CM | POA: Diagnosis not present

## 2016-05-12 ENCOUNTER — Ambulatory Visit: Payer: PPO | Admitting: *Deleted

## 2016-05-12 ENCOUNTER — Ambulatory Visit: Payer: Self-pay | Admitting: *Deleted

## 2016-05-12 DIAGNOSIS — G8918 Other acute postprocedural pain: Secondary | ICD-10-CM | POA: Diagnosis not present

## 2016-05-12 DIAGNOSIS — R0789 Other chest pain: Secondary | ICD-10-CM | POA: Diagnosis not present

## 2016-05-13 DIAGNOSIS — J449 Chronic obstructive pulmonary disease, unspecified: Secondary | ICD-10-CM | POA: Diagnosis not present

## 2016-05-13 DIAGNOSIS — J189 Pneumonia, unspecified organism: Secondary | ICD-10-CM | POA: Diagnosis not present

## 2016-05-14 ENCOUNTER — Other Ambulatory Visit: Payer: Self-pay | Admitting: *Deleted

## 2016-05-14 NOTE — Patient Outreach (Signed)
[place] [facility]) Care Management  05/14/2016  Stanley Rios. Nov 01, 1955 [id number]   Placed call to determine if patient had been discharged from hospital, Stanley Rios answered phone HIPAA information verified.  Patient was discharged home on 10/17, states they got home about 9 pm, wife discussed [facility] delivered oxygen and concentrator to home about 11 pm.Patient using oxygen at 4 liters nasal cannula,oxygen safety discussed, no smoking.   Stanley Rios reports that patient new prescriptions for keflex ,oxycodone were filled at hospital prior to discharge.Patient or Stanley Rios unable to review complete list of medication at this time, but states patient is taking medication as on the instructions they have.  Patient voiced concern about oxycodone not covering his pain and states his wife has spoken with surgeon office and awaiting return call.  Stanley Rios reports tolerating ambulating into living room,from bedroom but it took him a little while. Patient and wife discussed that they declined recommended home health services due to inability to afford co payments. Caregiver discussed needing shower chair Patient has scheduled office visit with PCP on 10/20 and states they have a friend to help with transportation to more local appointment , discussed concern regarding transportation to follow up appointment with surgeon office 10/26 and very limited finances.    Plan Will continue weekly outreaches as part of the transition of care program, will collaborate with [facility], regarding patient concern for transportation to appointments,shower chair delivery  (previously arranged) .  Will arrange home visit in the next week.    [facility] CM Care Plan Problem One   Flowsheet Row Most Recent Value  Care Plan Problem One  Recent hospital admission related to surgery   Role Documenting the Problem One  Care Management [place] for Problem One  Active  THN Long  Term Goal (31-90 days)  Patient will verbalize no readmissions in the next 31days.   THN Long Term Goal Start Date  05/14/16 Stanley Rios date restarted patient with readmission]  Interventions for Problem One Long Term Goal  Discussed with patient and wife importance of notifying MD sooner, of new or worsening symptoms, take medications as prescribed    THN CM Short Term Goal #1 (0-30 days)  Patient will verbalize attending followup appointment with primary MD in 7 days,  THN CM Short Term Goal #1 Start Date  05/14/16 [restarted patient with readmission ]  Interventions for Short Term Goal #1  Discussed the importance of attending PCP visit, verfied transportation   THN CM Short Term Goal #2 (0-30 days)  Patient will report improved pain control in the next 7 days   THN CM Short Term Goal #2 Start Date  05/14/16  Interventions for Short Term Goal #2  Family has placed call to surgeon regarding pain control, encourage to take medicatons as prescribed, discussed measures to control pain, using pillow to support area during coughing or moving.   THN CM Short Term Goal #3 (0-30 days)  Patient will be able to state worsenig symptoms to notify MD of in the next 20 days   THN CM Short Term Goal #3 Start Date  05/14/16 [goal date restarted]  Interventions for Short Tern Goal #3  Reinforced with patient to notify MD of increased pain,redness at incision ,shortness of breath , fever.      Stanley Draft, RN, [place] Management [phone]- Mobile [phone]- Toll Free Main Office

## 2016-05-15 ENCOUNTER — Ambulatory Visit: Payer: PPO | Admitting: *Deleted

## 2016-05-16 ENCOUNTER — Other Ambulatory Visit: Payer: Self-pay | Admitting: *Deleted

## 2016-05-16 NOTE — Patient Outreach (Signed)
[place] [facility]) Care Management  05/16/2016  Stanley Rios. 01-13-1956 [id number]   Incoming call from [place], she voiced concern regarding transportation services to patient's appointment for follow up with surgeon on 10/26. Discussed if her son was available to provide transportation she states that he is not able to, because if he is out of work he may lose his job. Reassured Stanley Rios that I have spoken with Chrystal Land,LCSW on 10/19 and she is looking into resources as we do not have a free transportation service.   Plan Will place phone call to Chrystal to discuss transportation to [facility] for post hospital surgeon visit. Will follow up with patient on next week for transition of care call and schedule home visit.  [name], RN, [place] Management [phone]- Mobile [phone]- Toll Free Main Office

## 2016-05-16 NOTE — Patient Outreach (Signed)
[place] [facility]) Care Management  05/16/2016  [name]. 1956/03/01 [id number]   Phone call to patient to discuss transportation needs.  Per patient, he is the primary driver and is unable to drive due to hernia surgery. Patient does not have the money to pay for transportation at this time. Patient has a post discharge appointment with the surgeon on 05/22/16 at 11 am.  He will receive x-rays first at 11 am and then will see the surgeon at 11:30am.  Transportation to be arranged through [facility].    Plan:  This Education officer, museum will schedule home visit within 1 month.   [name] [facility] Care Management [phone]

## 2016-05-19 ENCOUNTER — Other Ambulatory Visit: Payer: Self-pay | Admitting: *Deleted

## 2016-05-19 ENCOUNTER — Ambulatory Visit: Payer: Self-pay | Admitting: Internal Medicine

## 2016-05-19 NOTE — Patient Outreach (Signed)
[place] [facility]) Care Management  05/19/2016  Burle Kwan. 21-Jul-1956 [id number]   Phone call to patient's spouse to confirmatient's transportation to [facility] on 05/22/16 using [employer]. Per patient's spouse, she is prepared to pay parking fees.  Patient's spouse has also confirmed that she has gone through patient's surgeon to request a hospital bed.  The order was sent over to [facility] in [place] and they are awaiting authorization.  Home visit scheduled for 05/26/16 at 10am with this Education officer, museum and [place].  Shower chair and raised toilet seat to be provided.    [name] [facility] Care Management [phone]

## 2016-05-22 ENCOUNTER — Other Ambulatory Visit: Payer: Self-pay | Admitting: *Deleted

## 2016-05-22 DIAGNOSIS — R0602 Shortness of breath: Secondary | ICD-10-CM | POA: Diagnosis not present

## 2016-05-22 DIAGNOSIS — J984 Other disorders of lung: Secondary | ICD-10-CM | POA: Diagnosis not present

## 2016-05-22 DIAGNOSIS — M8440XK Pathological fracture, unspecified site, subsequent encounter for fracture with nonunion: Secondary | ICD-10-CM | POA: Diagnosis not present

## 2016-05-22 DIAGNOSIS — R918 Other nonspecific abnormal finding of lung field: Secondary | ICD-10-CM | POA: Diagnosis not present

## 2016-05-22 DIAGNOSIS — K449 Diaphragmatic hernia without obstruction or gangrene: Secondary | ICD-10-CM | POA: Diagnosis not present

## 2016-05-22 DIAGNOSIS — J90 Pleural effusion, not elsewhere classified: Secondary | ICD-10-CM | POA: Diagnosis not present

## 2016-05-22 NOTE — Patient Outreach (Signed)
[facility]) Care Management  05/22/2016  Stanley Rios. [birth year] [id number]  Transition of care call  Placed call to patient, able to speak with Stanley Rios, HIPAA verified.  She states  patient was able to attend appointment with  Surgeon, and states doctor was pleased with progress. Pain still has some pain discomfort and adjustment were made in his  pain medication.   Stanley Rios discussed that transportation service did not arrive for appointment and she was able to safely drive patient to appointment , she discussed how she had to stop several times due to her condition of nausea.  Patient has appointment with PCP on 10/27 and friend will provide transportation.     Plan Will discuss missed  transportation service appointment with Stanley Gurney, LCSW Will plan home visit on next week   [facility] CM Care Plan Problem One   Flowsheet Row Most Recent Value  Care Plan Problem One  Recent hospital admission related to surgery   Role Documenting the Problem One  Care Management [place] for Problem One  Active  THN Long Term Goal (31-90 days)  Patient will verbalize no readmissions in the next 31days.   THN Long Term Goal Start Date  05/14/16 [goal date restarted patient with readmission]  Interventions for Problem One Long Term Goal  Reinforced with patient and wife importance of notifying MD sooner, of new or worsening symptoms, take medications as prescribed    THN CM Short Term Goal #1 (0-30 days)  Patient will verbalize attending followup appointment with primary MD in 7 days,  THN CM Short Term Goal #1 Start Date  05/14/16 [restarted patient with readmission ]  Interventions for Short Term Goal #1  appointment rescheduled, verified transportation ,   [facility] CM Short Term Goal #2 (0-30 days)  Patient will report improved pain control in the next 7 days   THN CM Short Term Goal #2 Start Date  05/14/16  Interventions for Short Term Goal #2  Patient has  discussed pain control with surgeon   THN CM Short Term Goal #3 (0-30 days)  Patient will be able to state worsenig symptoms to notify MD of in the next 20 days   THN CM Short Term Goal #3 Start Date  05/14/16 [goal date restarted]  Interventions for Short Tern Goal #3  Reinforced with patient to notify MD of increased pain,redness at incision ,shortness of breath , fever.      [name], RN, [place] Management [phone]- Mobile [phone]- Toll Free Main Office

## 2016-05-23 ENCOUNTER — Other Ambulatory Visit: Payer: Self-pay | Admitting: *Deleted

## 2016-05-23 NOTE — Patient Outreach (Signed)
[place] [facility]) Care Management  05/23/2016  Stanley Rios. 08-26-1955 [id number]   Phone call from [facility]. Referral made to install grab bars in patient's bathroom.  They will contact patient to set up date and time to measure and install grab bars.  Patient will be informed to expect a call from [employer].    [name] [facility] Care Management [phone]

## 2016-05-26 ENCOUNTER — Other Ambulatory Visit: Payer: Self-pay | Admitting: *Deleted

## 2016-05-26 ENCOUNTER — Encounter: Payer: Self-pay | Admitting: *Deleted

## 2016-05-26 NOTE — Patient Outreach (Addendum)
[place] [facility]) Care Management  [facility] Social Work  05/26/2016  Maxi Rodas. [birth year] [id number]  Subjective:  Patient is a 60 year old male.  Objective:   Encounter Medications:  Outpatient Encounter Prescriptions as of 05/26/2016  Medication Sig Note  . albuterol (PROVENTIL HFA;VENTOLIN HFA) 108 (90 Base) MCG/ACT inhaler Inhale 2 puffs into the lungs every 4 (four) hours as needed for wheezing or shortness of breath. Reported on 10/26/2015   . baclofen (LIORESAL) 10 MG tablet Take 10 mg by mouth 2 (two) times daily as needed for muscle spasms.  05/01/2016: Not taking  . buPROPion (WELLBUTRIN) 75 MG tablet Take 75 mg by mouth 2 (two) times daily. Reported on 10/26/2015   . celecoxib (CELEBREX) 200 MG capsule Take 200 mg by mouth daily.    . clotrimazole-betamethasone (LOTRISONE) cream Apply 1 application topically 2 (two) times daily. To sacral area   . cyclobenzaprine (FLEXERIL) 10 MG tablet Take 10 mg by mouth 3 (three) times daily as needed for muscle spasms.   . diclofenac sodium (VOLTAREN) 1 % GEL Apply 4 g topically 4 (four) times daily as needed (for pain).    Marland Kitchen diltiazem (CARDIZEM SR) 90 MG 12 hr capsule Take 1 capsule (90 mg total) by mouth every 12 (twelve) hours.   . DULoxetine (CYMBALTA) 60 MG capsule Take 60 mg by mouth daily. Reported on 10/26/2015   . enalapril (VASOTEC) 5 MG tablet Take 5 mg by mouth daily. 05/01/2016: Not taking  . finasteride (PROSCAR) 5 MG tablet Take 5 mg by mouth daily.   . Fluticasone-Salmeterol (ADVAIR) 250-50 MCG/DOSE AEPB Inhale 1 puff into the lungs 2 (two) times daily. Reported on 10/26/2015   . furosemide (LASIX) 20 MG tablet Take 1 tablet (20 mg total) by mouth daily. For 1 week and then when necessary as needed for swelling and edema (Patient not taking: Reported on 05/14/2016)   . gabapentin (NEURONTIN) 400 MG capsule Take 800 mg by mouth 3 (three) times daily.   Marland Kitchen guaiFENesin-codeine 100-10 MG/5ML syrup Take 10 mLs by  mouth every 4 (four) hours as needed for cough. (Patient not taking: Reported on 05/14/2016) 05/01/2016: Not taking  . ipratropium (ATROVENT HFA) 17 MCG/ACT inhaler Inhale 2 puffs into the lungs every 6 (six) hours.   Marland Kitchen ipratropium-albuterol (DUONEB) 0.5-2.5 (3) MG/3ML SOLN Take 3 mLs by nebulization every 6 (six) hours as needed.   Marland Kitchen ketoconazole (NIZORAL) 2 % cream Apply 1 application topically 2 (two) times daily. (Patient not taking: Reported on 05/01/2016)   . lidocaine (XYLOCAINE) 5 % ointment Apply 1 application topically See admin instructions. 2 to 4 times daily   . metoprolol tartrate (LOPRESSOR) 25 MG tablet Take 1 tablet (25 mg total) by mouth 2 (two) times daily.   . mupirocin ointment (BACTROBAN) 2 % Apply 1 application topically 3 (three) times daily. (Patient not taking: Reported on 05/01/2016) 05/01/2016: Not taking  . naloxone (NARCAN) 2 MG/2ML injection Inject 2 mLs into the muscle as needed. For side effect/adverse reaction/opioid overdose 05/01/2016: Not taking  . omeprazole (PRILOSEC) 40 MG capsule Take 40 mg by mouth 2 (two) times daily.   . polyethylene glycol (MIRALAX / GLYCOLAX) packet Take 17 g by mouth daily.   . potassium chloride SA (K-DUR,KLOR-CON) 20 MEQ tablet Take 1 tablet (20 mEq total) by mouth daily. When ever the furosemide is taken   . simvastatin (ZOCOR) 20 MG tablet Take 20 mg by mouth at bedtime.    . tamsulosin (FLOMAX) 0.4  MG CAPS capsule Take 0.4 mg by mouth daily.   Marland Kitchen tiotropium (SPIRIVA) 18 MCG inhalation capsule Place 18 mcg into inhaler and inhale daily. Reported on 10/26/2015   . TRAMADOL HCL PO Take 50 mg by mouth every 6 (six) hours as needed.   . traZODone (DESYREL) 50 MG tablet Take 150 mg by mouth at bedtime.   Marland Kitchen zinc oxide (MEIJER ZINC OXIDE) 20 % ointment Apply 1 application topically 4 (four) times daily as needed for irritation.    No facility-administered encounter medications on file as of 05/26/2016.     Functional Status:  In your present  state of health, do you have any difficulty performing the following activities: 04/28/2016 04/06/2016  Hearing? N N  Vision? N N  Difficulty concentrating or making decisions? N N  Walking or climbing stairs? Y N  Dressing or bathing? Y N  Doing errands, shopping? Y N  Preparing Food and eating ? Y -  In the past six months, have you accidently leaked urine? Y -  Do you have problems with loss of bowel control? N -  Managing your Medications? N -  Managing your Finances? Y -  Housekeeping or managing your Housekeeping? Y -  Some recent data might be hidden    Fall/Depression Screening:  PHQ 2/9 Scores 05/01/2016 03/27/2016  PHQ - 2 Score 2 0  PHQ- 9 Score 3 -    Assessment: Co-visit with RNCM Landis Martins and nursing student. Shower chair and raised toilet seat provided to assist with avoiding falls.  Discussed referral for grab bars through [employer]. Willis with approximate measurements to use to install bars.  [name] will call patient to schedule a date for the install. [facility] continues to work with [employer] for authorization for hospital.  Patient has not seen primary care doctor, last appointment was cancelled  for next week to be re-scheduled closer to the time he needs a re-fill on his medications. Patient has placed obtaining a wheelchair on hold at this time. Primary focus is getting the hospital bed and grab bars in the bathroom. Patient has a motorized scooter for use as well as a Engineer, civil (consulting).  Patient's spouse requested resources for respite care.  Contact information provided for [facility] [phone] to request opportunities for respite care hours.     Plan: This Education officer, museum will follow up with patient within the next 30 days regarding grab bar installation for the bathroom to avoid falls.

## 2016-05-26 NOTE — Patient Outreach (Signed)
[name]) Care Management   05/26/2016  Stanley Rios. 1956/05/10 [id number]  Stanley Rios. is an 60 y.o. male  Subjective:   Stanley Rios discussed improvement in his condition since last home visit. Patient discussed his recent surgery, healing and pain controlled improved.  Patient has attended post op visit with surgeon, his appointment with PCP has been delayed due to trying arrange closer to timing of needed refills .  Patient has not smoked cigarettes in the last 2 weeks, using continuous oxygen at 3 liters Mr.Fallen discussed tolerating increased activity in the home , ambulating in home and some outside per wife's reports.  [name] mother is pursuing getting a bed for patient to rest in at night,he has a recliner but increased concern regarding patient falling from chair, [name] states MD orders have been sent to [facility] supply.     Objective:  BP 120/76 (BP Location: Left Arm, Patient Position: Sitting)   Pulse 94   Resp 20   SpO2 97%   Patient sitting on sofa in living room oxygen in place.   Review of Systems  Constitutional: Negative.   HENT: Negative.   Eyes: Negative.   Cardiovascular: Positive for leg swelling.       Swelling at bilateral legs above where bilateral sock area have left indentations.    Gastrointestinal: Negative.   Genitourinary: Negative.   Skin: Negative.   Neurological: Negative.   Endo/Heme/Allergies: Negative.   Psychiatric/Behavioral: Negative.     Physical Exam  Constitutional: He is oriented to person, place, and time. He appears well-developed and well-nourished.  Cardiovascular: Normal rate and normal heart sounds.   Respiratory: Effort normal.  Slightly decreased at lower lobes  GI: Soft.  Neurological: He is alert and oriented to person, place, and time.  Skin: Skin is warm and dry.     Psychiatric: He has a normal mood and affect. His behavior is normal. Judgment and thought  content normal.    Encounter Medications:   Outpatient Encounter Prescriptions as of 05/26/2016  Medication Sig Note  . albuterol (PROVENTIL HFA;VENTOLIN HFA) 108 (90 Base) MCG/ACT inhaler Inhale 2 puffs into the lungs every 4 (four) hours as needed for wheezing or shortness of breath. Reported on 10/26/2015   . baclofen (LIORESAL) 10 MG tablet Take 10 mg by mouth 2 (two) times daily as needed for muscle spasms.  05/01/2016: Not taking  . buPROPion (WELLBUTRIN) 75 MG tablet Take 75 mg by mouth 2 (two) times daily. Reported on 10/26/2015   . celecoxib (CELEBREX) 200 MG capsule Take 200 mg by mouth daily.    . clotrimazole-betamethasone (LOTRISONE) cream Apply 1 application topically 2 (two) times daily. To sacral area   . cyclobenzaprine (FLEXERIL) 10 MG tablet Take 10 mg by mouth 3 (three) times daily as needed for muscle spasms.   . diclofenac sodium (VOLTAREN) 1 % GEL Apply 4 g topically 4 (four) times daily as needed (for pain).    Marland Kitchen diltiazem (CARDIZEM SR) 90 MG 12 hr capsule Take 1 capsule (90 mg total) by mouth every 12 (twelve) hours.   . DULoxetine (CYMBALTA) 60 MG capsule Take 60 mg by mouth daily. Reported on 10/26/2015   . enalapril (VASOTEC) 5 MG tablet Take 5 mg by mouth daily. 05/01/2016: Not taking  . finasteride (PROSCAR) 5 MG tablet Take 5 mg by mouth daily.   . Fluticasone-Salmeterol (ADVAIR) 250-50 MCG/DOSE AEPB Inhale 1 puff into the lungs 2 (two) times daily. Reported on 10/26/2015   .  furosemide (LASIX) 20 MG tablet Take 1 tablet (20 mg total) by mouth daily. For 1 week and then when necessary as needed for swelling and edema (Patient not taking: Reported on 05/14/2016)   . gabapentin (NEURONTIN) 400 MG capsule Take 800 mg by mouth 3 (three) times daily.   Marland Kitchen guaiFENesin-codeine 100-10 MG/5ML syrup Take 10 mLs by mouth every 4 (four) hours as needed for cough. (Patient not taking: Reported on 05/14/2016) 05/01/2016: Not taking  . ipratropium (ATROVENT HFA) 17 MCG/ACT inhaler Inhale  2 puffs into the lungs every 6 (six) hours.   Marland Kitchen ipratropium-albuterol (DUONEB) 0.5-2.5 (3) MG/3ML SOLN Take 3 mLs by nebulization every 6 (six) hours as needed.   Marland Kitchen ketoconazole (NIZORAL) 2 % cream Apply 1 application topically 2 (two) times daily. (Patient not taking: Reported on 05/01/2016)   . lidocaine (XYLOCAINE) 5 % ointment Apply 1 application topically See admin instructions. 2 to 4 times daily   . metoprolol tartrate (LOPRESSOR) 25 MG tablet Take 1 tablet (25 mg total) by mouth 2 (two) times daily.   . mupirocin ointment (BACTROBAN) 2 % Apply 1 application topically 3 (three) times daily. (Patient not taking: Reported on 05/01/2016) 05/01/2016: Not taking  . naloxone (NARCAN) 2 MG/2ML injection Inject 2 mLs into the muscle as needed. For side effect/adverse reaction/opioid overdose 05/01/2016: Not taking  . omeprazole (PRILOSEC) 40 MG capsule Take 40 mg by mouth 2 (two) times daily.   . polyethylene glycol (MIRALAX / GLYCOLAX) packet Take 17 g by mouth daily.   . potassium chloride SA (K-DUR,KLOR-CON) 20 MEQ tablet Take 1 tablet (20 mEq total) by mouth daily. When ever the furosemide is taken   . simvastatin (ZOCOR) 20 MG tablet Take 20 mg by mouth at bedtime.    . tamsulosin (FLOMAX) 0.4 MG CAPS capsule Take 0.4 mg by mouth daily.   Marland Kitchen tiotropium (SPIRIVA) 18 MCG inhalation capsule Place 18 mcg into inhaler and inhale daily. Reported on 10/26/2015   . TRAMADOL HCL PO Take 50 mg by mouth every 6 (six) hours as needed.   . traZODone (DESYREL) 50 MG tablet Take 150 mg by mouth at bedtime.   Marland Kitchen zinc oxide (MEIJER ZINC OXIDE) 20 % ointment Apply 1 application topically 4 (four) times daily as needed for irritation.    No facility-administered encounter medications on file as of 05/26/2016.   Patient was recently discharged from hospital and all medications have been reviewed.  Functional Status:   In your present state of health, do you have any difficulty performing the following activities:  04/28/2016 04/06/2016  Hearing? N N  Vision? N N  Difficulty concentrating or making decisions? N N  Walking or climbing stairs? Y N  Dressing or bathing? Y N  Doing errands, shopping? Y N  Preparing Food and eating ? Y -  In the past six months, have you accidently leaked urine? Y -  Do you have problems with loss of bowel control? N -  Managing your Medications? N -  Managing your Finances? Y -  Housekeeping or managing your Housekeeping? Y -  Some recent data might be hidden    Fall/Depression Screening:    PHQ 2/9 Scores 05/01/2016 03/27/2016  PHQ - 2 Score 2 0  PHQ- 9 Score 3 -   Fall Risk  05/01/2016 05/01/2016 03/27/2016  Falls in the past year? - No No  Risk for fall due to : (No Data) Impaired balance/gait -  Risk for fall due to (comments): uses rolling walker  fall prevention discussed -    Assessment:  Co-visit with Chrystal Land, LCSW along with [employer] nursing student [employer].    Copd - in green zone, using oxygen and medications as prescribed.    Recent Surgery -  Progressing well, incision line closed, appetite good, patient continues to use incentive spirometry,proper technique observed, patient  reports improved pain control .   Fall Risk- reinforced use of rollator, patient has improved with mobility in home.     Plan:  Will continue weekly telephone outreach to patient as part of transition of care program, next follow up call in a week.  Will send this visit note to PCP  Patient centered care planning reviewed.  Mrs.Criscione will schedule PCP visit for patient   [facility] CM Care Plan Problem One   Flowsheet Row Most Recent Value  Care Plan Problem One  Recent hospital admission related to surgery   Role Documenting the Problem One  Care Management [place] for Problem One  Active  THN Long Term Goal (31-90 days)  Patient will verbalize no readmissions in the next 31days.   THN Long Term Goal Start Date  05/14/16 Barrie Folk date restarted patient  with readmission]  Interventions for Problem One Long Term Goal  Reminded patient and wife to notify MD of new concerns sooner to arrange office visit.   THN CM Short Term Goal #1 (0-30 days)  Patient will verbalize attending followup appointment with primary MD in 7 days,  THN CM Short Term Goal #1 Start Date  05/14/16 [restarted patient with readmission ]  Interventions for Short Term Goal #1  reinforced with caregiver to reschedule post hospital visit with PCP  [facility] CM Short Term Goal #2 (0-30 days)  Patient will report improved pain control in the next 7 days   THN CM Short Term Goal #2 Start Date  05/14/16  [facility] CM Short Term Goal #2 Met Date  05/26/16  THN CM Short Term Goal #3 (0-30 days)  Patient will be able to state worsenig symptoms to notify MD of in the next 20 days   THN CM Short Term Goal #3 Start Date  05/14/16 [goal date restarted]  Interventions for Short Tern Goal #3  Reinforced zones of COPD chart to notify MD of sooner, provided EMMI handout on COPD, when to call and about COPD, will review with teachback  THN CM Short Term Goal #4 (0-30 days)  Patient will report continued none smoking in the next 15 days   THN CM Short Term Goal #4 Start Date  05/26/16  Interventions for Short Term Goal #4  Praised patient with progress, provided and reviewed EMMI handout on  COPD quit smoking benefits       [name], RN, [place] Management [phone]- Mobile [phone]- [place]

## 2016-06-02 ENCOUNTER — Other Ambulatory Visit: Payer: Self-pay | Admitting: *Deleted

## 2016-06-02 NOTE — Patient Outreach (Signed)
[place] [facility]) Care Management  06/02/2016  Stanley Rios. 06/15/1956 [id number]  Transition of care note  Placed call to patient,and wife, patient reports doing fairly well with still some discomfort at surgical incisional area. Patient denies increase in shortness, cough or swelling,incision area without redness no fever. Patient continues to use his incentive spirometry at least 30 - 40 reps a day, and patient is NOT smoking, he continues to use nicotine patches. Patient wife report he is  tolerating taking a shower well on today, and discussed the benefits of having the shower chair as well as raised commode seat.  Mrs.Lindon discussed that she hasn't made follow up appointment with PCP yet, she discussed co payment of $40 and other financial obligations that they had this month, and that she is going to call the PCP office and talk with them regarding scheduling appointment and being able to pay within the next month.   Mrs.Tindol denies having concern with medication cost.   Mrs.Machi has received an update regarding the hospital bed, states it is in the claims department.    Plan Will discuss financial concerns  and phone visit with Chrystal Land, LCSW Will place weekly outreach call to patient in next week as part of the transition of care program   [facility] CM Care Plan Problem One   Flowsheet Row Most Recent Value  Care Plan Problem One  Recent hospital admission related to surgery   Role Documenting the Problem One  Care Management [place] for Problem One  Active  THN Long Term Goal (31-90 days)  Patient will verbalize no readmissions in the next 31days.   THN Long Term Goal Start Date  05/14/16 Barrie Folk date restarted patient with readmission]  Interventions for Problem One Long Term Goal  Reinforced with  patient and wife to notify MD of new concerns sooner to arrange office visit.   THN CM Short Term Goal #1 (0-30 days)  Patient will verbalize  attending followup appointment with primary MD in 7 days,  THN CM Short Term Goal #1 Start Date  05/14/16 [restarted patient with readmission ]  Interventions for Short Term Goal #1  Discusssed with caregiver regarding importance of PCP office, she will call to see if they are able to make appointment and pay within next month.   THN CM Short Term Goal #2 (0-30 days)  Patient will report improved pain control in the next 7 days   THN CM Short Term Goal #2 Start Date  05/14/16  THN CM Short Term Goal #2 Met Date  05/26/16  THN CM Short Term Goal #3 (0-30 days)  Patient will be able to state worsenig symptoms to notify MD of in the next 20 days   THN CM Short Term Goal #3 Start Date  05/14/16 [goal date restarted]  Interventions for Short Tern Goal #3  Reviewed with teachback when to call MD for worsening   THN CM Short Term Goal #4 (0-30 days)  Patient will report continued none smoking in the next 15 days   THN CM Short Term Goal #4 Start Date  05/26/16  Interventions for Short Term Goal #4  Praised patient with progress, provided and reviewed EMMI handout on  COPD quit smoking benefits      [name], RN, [place] Management [phone]- Mobile [phone]- [place] .

## 2016-06-09 ENCOUNTER — Other Ambulatory Visit: Payer: Self-pay | Admitting: *Deleted

## 2016-06-09 NOTE — Patient Outreach (Signed)
[facility]) Care Management  06/09/2016  Jamyron Redd. [birth year] [id number]   Transition of care call  Spoke with Mrs.Lineback, HIPAA verified. She reports patient is overall doing good. Patient continues NOTsmoking, wears oxygen at 3.5 liters, tolerating activity in home, uses his scooter outside. Patient denies any dizziness, wife states blood pressure has ranged in the 120/80 and heart rate less than 100. Patient continues to take his medications as prescribed, has some discomfort at surgical incision but it is improving, and site looks good.  Mrs.Smalling discussed that patient has appointment with PCP on Friday, November 17, and denies transportation concerns. She discussed that they maybe be moving to a different location in [place], but will not find out for a couple of week, she discussed maybe if agency that plans on installing rails in bathroom can wait a few weeks,if case they move rails can be placed in new location, will discuss when they call.  Mrs.Stephenson discussed that they are still awaiting approval for hospital bed in the home, she plans to contact insurance company on today for follow up.   Plan Will plan  final transition of care call in a week and will continue  plan to follow for next 30 days as complex case management , discussed with patient/wife a agreeable    [facility] CM Care Plan Problem One   Flowsheet Row Most Recent Value  Care Plan Problem One  Recent hospital admission related to surgery   Role Documenting the Problem One  Care Management [place] for Problem One  Active  THN Long Term Goal (31-90 days)  Patient will verbalize no readmissions in the next 31days.   THN Long Term Goal Start Date  05/14/16 Barrie Folk date restarted patient with readmission]  Interventions for Problem One Long Term Goal  Reviewed with  patient and wife to notify MD of new concerns sooner to arrange office visit.   THN CM Short Term Goal #1 (0-30 days)   Patient will verbalize attending followup appointment with primary MD in 7 days,  THN CM Short Term Goal #1 Start Date  05/14/16 [restarted patient with readmission ]  Interventions for Short Term Goal #1  Reinforced the importance of attending scheduling PCP visit , this week, verified transportation   THN CM Short Term Goal #2 (0-30 days)  Patient will report improved pain control in the next 7 days   THN CM Short Term Goal #2 Start Date  05/14/16  [facility] CM Short Term Goal #2 Met Date  05/26/16  THN CM Short Term Goal #3 (0-30 days)  Patient will be able to state worsenig symptoms to notify MD of in the next 20 days   THN CM Short Term Goal #3 Start Date  05/14/16 [goal date restarted]  [facility] CM Short Term Goal #3 Met Date  06/09/16  Interventions for Short Tern Goal #3  Reviewed with teachback when to call MD for worsening   THN CM Short Term Goal #4 (0-30 days)  Patient will report continued none smoking in the next 15 days   THN CM Short Term Goal #4 Start Date  05/26/16  Interventions for Short Term Goal #4  reinforced importance of not smoking     [name], RN, [place] Management [phone]- Mobile [phone]- [place]

## 2016-06-13 DIAGNOSIS — J449 Chronic obstructive pulmonary disease, unspecified: Secondary | ICD-10-CM | POA: Diagnosis not present

## 2016-06-13 DIAGNOSIS — R0603 Acute respiratory distress: Secondary | ICD-10-CM | POA: Diagnosis not present

## 2016-06-13 DIAGNOSIS — J439 Emphysema, unspecified: Secondary | ICD-10-CM | POA: Diagnosis not present

## 2016-06-13 DIAGNOSIS — I10 Essential (primary) hypertension: Secondary | ICD-10-CM | POA: Diagnosis not present

## 2016-06-13 DIAGNOSIS — J189 Pneumonia, unspecified organism: Secondary | ICD-10-CM | POA: Diagnosis not present

## 2016-06-16 ENCOUNTER — Other Ambulatory Visit: Payer: Self-pay | Admitting: *Deleted

## 2016-06-16 NOTE — Patient Outreach (Signed)
[facility] Care Management  06/16/2016  Kairon Shock. [birth year] [id number]   Final transition of care call  Spoke with Mrs.Baldassari,patient's caregiver, she reports that patient is doing alright. She discussed that they recently visit PCP for post hospital visit, discussed patient still has some discomfort at incisional area, but it has healed well. Patient for follow up chest xray on next week.   Patient continues to tolerate activity in home and going outside he uses his scooter to get to the car. Patient continues to wear his oxygen at 3.5 liters care giver still encourages patient with use of incentive, and Mr.Larose remains NOT smoking. Denies increase in shortness of breath,cough or sputum production or increased use of nebulizer treatments.  Patient has all of his medications and denies problems with purchasing.  Mrs.Henkin discussed that they now have hospital bed, but it is uncomfortable, would be better if it had a gel foam mattress which is an additional cost, she has been searching on [employer] she states as suggested by [facility].  Mrs.Bagshaw also discussed that they would be moving to another home in a few weeks, she reports that she had not heard back from the community church group regarding installing rails in bathroom, she does not have the contact number, and prefers rails to be installed at location they plan to move to.,   Patient has successfully completed transition of care follow up without readmissions. Will continue to follow patient for complex case management . Discussed with patient/caregiver to call sooner if concerns .   Plan Will plan follow up call in the next 2 weeks and home visit in the next month.    Will discuss with Chrystal Land LCSW regarding patient concern regarding rails being installed in bathroom. [facility] CM Care Plan Problem One   Flowsheet Row Most Recent Value  Care Plan Problem One  High Risk for hospital readmissions related to  COPD and surgery  Role Documenting the Problem One  Care Management [place] for Problem One  Active  THN Long Term Goal (31-90 days)  Patient will report no hospital readmissions in the next 31 days   THN Long Term Goal Start Date  06/16/16  [facility] Long Term Goal Met Date  06/16/16  Interventions for Problem One Long Term Goal  Reinforced importance of taking medications, keeping all follow up appointments and notifying MD of new or worsening symptoms    THN CM Short Term Goal #1 (0-30 days)  Patient will be able to recognize symptoms COPD zones and actions to take in  the next 30 days   THN CM Short Term Goal #1 Start Date  06/16/16  [facility] CM Short Term Goal #1 Met Date  06/16/16  Interventions for Short Term Goal #1  Reviewed importance of taking actions of symptoms in the yellow zone   THN CM Short Term Goal #2 (0-30 days)  Patient will report continuing to monitor and record blood pressure reading in the next 30 days   THN CM Short Term Goal #2 Start Date  06/16/16  [facility] CM Short Term Goal #2 Met Date  05/26/16  Interventions for Short Term Goal #2  DIscussed benefits of monitoring and keeping a record of readings to detect trend and nofity MD of sudden changes.   THN CM Short Term Goal #3 (0-30 days)  Patient will be able to state worsenig symptoms to notify MD of in the next 20 days   THN CM Short Term  Goal #3 Start Date  05/14/16 [goal date restarted]  [facility] CM Short Term Goal #3 Met Date  06/09/16  Interventions for Short Tern Goal #3  Reviewed with teachback when to call MD for worsening   THN CM Short Term Goal #4 (0-30 days)  Patient will report continued none smoking in the next 15 days   THN CM Short Term Goal #4 Start Date  05/26/16  [facility] CM Short Term Goal #4 Met Date  06/16/16  Interventions for Short Term Goal #4  reinforced importance of not smoking     [name], RN, [place] Management [phone]- Mobile [phone]- [place]

## 2016-06-24 DIAGNOSIS — R918 Other nonspecific abnormal finding of lung field: Secondary | ICD-10-CM | POA: Diagnosis not present

## 2016-06-24 DIAGNOSIS — Z981 Arthrodesis status: Secondary | ICD-10-CM | POA: Diagnosis not present

## 2016-06-24 DIAGNOSIS — J439 Emphysema, unspecified: Secondary | ICD-10-CM | POA: Diagnosis not present

## 2016-06-24 DIAGNOSIS — J90 Pleural effusion, not elsewhere classified: Secondary | ICD-10-CM | POA: Diagnosis not present

## 2016-06-30 ENCOUNTER — Other Ambulatory Visit: Payer: Self-pay | Admitting: *Deleted

## 2016-06-30 NOTE — Patient Outreach (Signed)
[place] [facility]) Care Management  06/30/2016  Jairo Bellew. 1956/05/10 [id number]  Follow up telephone assessment  Unsuccessful telephone call to patient , no answer, able to leave a Hipaa compliant telephone message requesting a return call.  Plan Will await return call, if no response will contact patient within a week regarding already  scheduled home visit within a week.   [name], RN, [place] Management [phone]- Mobile [phone]- Toll Free Main Office

## 2016-07-03 DIAGNOSIS — I10 Essential (primary) hypertension: Secondary | ICD-10-CM | POA: Diagnosis not present

## 2016-07-03 DIAGNOSIS — R109 Unspecified abdominal pain: Secondary | ICD-10-CM | POA: Diagnosis not present

## 2016-07-03 DIAGNOSIS — K4090 Unilateral inguinal hernia, without obstruction or gangrene, not specified as recurrent: Secondary | ICD-10-CM | POA: Diagnosis not present

## 2016-07-03 DIAGNOSIS — K5730 Diverticulosis of large intestine without perforation or abscess without bleeding: Secondary | ICD-10-CM | POA: Diagnosis not present

## 2016-07-03 DIAGNOSIS — I4510 Unspecified right bundle-branch block: Secondary | ICD-10-CM | POA: Diagnosis not present

## 2016-07-03 DIAGNOSIS — J9811 Atelectasis: Secondary | ICD-10-CM | POA: Diagnosis not present

## 2016-07-03 DIAGNOSIS — R1900 Intra-abdominal and pelvic swelling, mass and lump, unspecified site: Secondary | ICD-10-CM | POA: Diagnosis not present

## 2016-07-03 DIAGNOSIS — J439 Emphysema, unspecified: Secondary | ICD-10-CM | POA: Diagnosis not present

## 2016-07-03 DIAGNOSIS — R911 Solitary pulmonary nodule: Secondary | ICD-10-CM | POA: Diagnosis not present

## 2016-07-03 DIAGNOSIS — J90 Pleural effusion, not elsewhere classified: Secondary | ICD-10-CM | POA: Diagnosis not present

## 2016-07-03 DIAGNOSIS — R000 Tachycardia, unspecified: Secondary | ICD-10-CM | POA: Diagnosis not present

## 2016-07-03 DIAGNOSIS — K449 Diaphragmatic hernia without obstruction or gangrene: Secondary | ICD-10-CM | POA: Diagnosis not present

## 2016-07-03 DIAGNOSIS — F419 Anxiety disorder, unspecified: Secondary | ICD-10-CM | POA: Diagnosis not present

## 2016-07-03 DIAGNOSIS — J449 Chronic obstructive pulmonary disease, unspecified: Secondary | ICD-10-CM | POA: Diagnosis not present

## 2016-07-03 DIAGNOSIS — R0602 Shortness of breath: Secondary | ICD-10-CM | POA: Diagnosis not present

## 2016-07-03 DIAGNOSIS — R1012 Left upper quadrant pain: Secondary | ICD-10-CM | POA: Diagnosis not present

## 2016-07-03 DIAGNOSIS — R9431 Abnormal electrocardiogram [ECG] [EKG]: Secondary | ICD-10-CM | POA: Diagnosis not present

## 2016-07-03 DIAGNOSIS — F329 Major depressive disorder, single episode, unspecified: Secondary | ICD-10-CM | POA: Diagnosis not present

## 2016-07-03 DIAGNOSIS — E785 Hyperlipidemia, unspecified: Secondary | ICD-10-CM | POA: Diagnosis not present

## 2016-07-03 DIAGNOSIS — F17210 Nicotine dependence, cigarettes, uncomplicated: Secondary | ICD-10-CM | POA: Diagnosis not present

## 2016-07-04 ENCOUNTER — Other Ambulatory Visit: Payer: Self-pay | Admitting: *Deleted

## 2016-07-04 NOTE — Patient Outreach (Signed)
[facility] [facility] Care Management  07/04/2016  Bartlett Enke. 08-24-1955 [id number]   Telephone assessment  Placed call to patient to follow up , unable to contact patient at scheduled telephone visit earlier this week to  verify patient availability for scheduled home visit on next week, due to patient moving to another home this month.  Spoke with Mrs.Stanke, Hipaa information verified, she states patient went to emergency room at [facility] on yesterday due to pain at surgical area  and was then transferred to [facility] for observation at midnight  due to question of whether reoccurrence of hernia at recent surgical area.  Mrs.Metzger discussed that patient drove himself to [facility] on yesterday and that is where is car is. Patient to be discharged from [facility] on today and return to see MD on next week that performed the surgery Dr.Haithcock.  Mrs.Pricer has spoken with the case manager at [facility] this morning regarding patient discharge and  logistics on patient return home, as his car is in [place] and he is in need of filled oxygen tanks to return home. Per wife care manager is working on the situation and will return call to patient . Mrs.Scheidt states her son is unable to help with situation on today as he is working out of town.    Mrs.Ayotte request to delay home visit due to possibility of MD visit on Monday at time of previously scheduled home visit and they are in the process of moving to another location ASAP.   1320 Placed call to Mrs.Dedman she reports that [facility] hospital has arranged to send patient home in a taxi at no cost to patient and with oxygen tank.   Plan  Will schedule return call to patient on Monday.  Will update Chrystal Land LCSW of today's visit.  [name], RN, [place] Management [phone]- Mobile [phone]- Toll Free Main Office

## 2016-07-07 ENCOUNTER — Ambulatory Visit: Payer: Self-pay | Admitting: *Deleted

## 2016-07-07 ENCOUNTER — Other Ambulatory Visit: Payer: Self-pay | Admitting: *Deleted

## 2016-07-07 NOTE — Patient Outreach (Signed)
[place] [facility]) Care Management  07/07/2016  Stanley Rios. 1956/04/09 [id number]  Transition of care call In reviewing Medical records patient was admitted to [facility] 12/8, after being transferred from [facility] emergency room on 12/7 due to concern regarding recurrence of left lung hernia  and discharged on 12/8.  Will follow patient for transition of care per workflow.   Placed call to Stanley Rios, able to speak with his wife Stanley Rios, she states patient is doing okay, but concerned about possible need for further surgery. Discussed patient without increase in shortness of breath cough or sputum. Stanley Rios has follow up appointment with Dr.Haithcock, surgeon at [place] to evaluate for hernia recurrence on  12/12. Patient was recently discharged from hospital and all medications have been reviewed.  [name] discussed concerns regarding getting everything done to be able to move to another mobile home near Stanley Rios mother.  Plan Will place follow up telephone call on Friday to follow up regarding surgeon visit .  Will follow up with patient weekly as part of transition of care and schedule home visit when patient available .   THN CM Care Plan Problem One   Flowsheet Row Most Recent Value  Care Plan Problem One  High risk for readmission as evidenced by recent admission   Role Documenting the Problem One  Care Management [place] for Problem One  Active  THN Long Term Goal (31-90 days)  Patient will not experience a hospital admission in the next 31 days   THN Long Term Goal Start Date  07/07/16  Interventions for Problem One Long Term Goal  Reinforced the importance of taking medication as prescribed, following MD discharge instructions   THN CM Short Term Goal #1 (0-30 days)  Patient will attend MD/Surgeon follow up appointment in the next 7 days   THN CM Short Term Goal #1 Start Date  07/07/16  Interventions for Short Term Goal #1  Reviewed  importance of prompt MD office follow up after discharge.  THN CM Short Term Goal #2 (0-30 days)  Patient will adhere to activity and weight limit restrictions in the next 14 days   THN CM Short Term Goal #2 Start Date  07/07/16  Interventions for Short Term Goal #2  Reviewed importance of adhering to activity restrictions to prevent  further complications      [name], RN, [place] Management [phone]- Mobile [phone]- [place] Office

## 2016-07-11 ENCOUNTER — Other Ambulatory Visit: Payer: Self-pay | Admitting: *Deleted

## 2016-07-11 NOTE — Patient Outreach (Signed)
[facility]) Care Management  07/11/2016  Wyett Narine. [birth year] [id number]   Phone call from [place] from [facility] who stated that she had reached out to patient and patient's wife regarding installing the grab bars in their bathroom. [name] was told by patient's wife  that they will be moving soon and that once they move she will contact [place] for assistance with installing the grab bars.   [name] [facility] Care Management [phone]

## 2016-07-11 NOTE — Patient Outreach (Signed)
[facility]) Care Management  07/11/2016  Stanley Rios. 09-22-1955 [id number]   Placed call to patient , no answer able to leave a Hippa compliant telephone message  requesting return call.  Plan Will await return call if no response will schedule telephone visit for next week.  [name], RN, [place] Management [phone]- Mobile [phone]- Toll Free Main Office

## 2016-07-13 DIAGNOSIS — R0603 Acute respiratory distress: Secondary | ICD-10-CM | POA: Diagnosis not present

## 2016-07-13 DIAGNOSIS — J449 Chronic obstructive pulmonary disease, unspecified: Secondary | ICD-10-CM | POA: Diagnosis not present

## 2016-07-13 DIAGNOSIS — J189 Pneumonia, unspecified organism: Secondary | ICD-10-CM | POA: Diagnosis not present

## 2016-07-15 DIAGNOSIS — R52 Pain, unspecified: Secondary | ICD-10-CM | POA: Diagnosis not present

## 2016-07-16 ENCOUNTER — Other Ambulatory Visit: Payer: Self-pay | Admitting: *Deleted

## 2016-07-16 NOTE — Patient Outreach (Signed)
  [place] [facility]) Care Management  07/16/2016  Stanley Rios. [birth year] [id number]   Transition of care call, spoke with [name], hipaa information verified. She reports patient is feeling some better, relieved after seeing surgeon on yesterday and receiving news that additional surgery is not needed at this time.Per [name]  Dr.Haithcock reinforced importance of no heavy lifting. [name] reports that they are still in the process of moving hopefully in the next week or 2 , preferred to wait on scheduling home visit after they have moved.  Patient denies any new concerns at this time.    Plan Will continue with weekly outreach for transition of care, and schedule visit when patient agreeable. Patient will adhere to lifting restrictions.   [name], RN, [place] Management [phone]- Mobile [phone]- Toll Free Main Office

## 2016-07-24 ENCOUNTER — Other Ambulatory Visit: Payer: Self-pay | Admitting: *Deleted

## 2016-07-24 NOTE — Patient Outreach (Signed)
[facility] [facility]) Care Management  07/24/2016  Shivank Pinedo. 1955/07/31 [id number]   Placed transition of care call to patient , no answer able to leave a Hipaa a compliant message requesting a return call.  Plan Will await return call if no response will, reschedule call in the next week as part of transition of care   [name], RN, [place] Management [phone]- Mobile [phone]- [place]

## 2016-07-29 ENCOUNTER — Other Ambulatory Visit: Payer: Self-pay | Admitting: *Deleted

## 2016-07-29 ENCOUNTER — Encounter: Payer: Self-pay | Admitting: *Deleted

## 2016-07-29 NOTE — Patient Outreach (Signed)
[facility] [facility]) Care Management  07/29/2016  Stanley Rios. 19-Nov-1955 [id number]   Transition of care  Placed call to patient ,reports doing fairly well, continues to have some discomfort at left abdomen surgical site area. Patient is taking ibuprofen as suggested by MD  as needed for discomfort with some relief  Patient continues to limit lifting greater than 10 lbs.Mr.Kleinert discussed she is going to make a follow up appointment with [place] surgeon if patient continues with discomfort. Mr.Canupp continues to not smoke and is wearing oxygen at 3 liters , and tolerating usual home activities, walking in home, showering with assistance. Patient denies any increase in shortness of breath, cough or sputum production.   Mrs.Dollinger discussed that they plan to move to new location on 12/6, and request phone call on next week to follow up with scheduling a home visit.  Plan Will place weekly outreach call in the next week. Patient will notify MD or worsening of symptoms.  [name], RN, [place] Management [phone]- Mobile [phone]- Toll Free Main Office

## 2016-08-05 ENCOUNTER — Encounter: Payer: Self-pay | Admitting: Pain Medicine

## 2016-08-05 ENCOUNTER — Other Ambulatory Visit: Payer: Self-pay | Admitting: *Deleted

## 2016-08-05 ENCOUNTER — Ambulatory Visit: Payer: PPO | Attending: Pain Medicine | Admitting: Pain Medicine

## 2016-08-05 VITALS — BP 106/76 | HR 95 | Temp 97.4°F | Resp 18 | Ht 71.0 in | Wt 164.0 lb

## 2016-08-05 DIAGNOSIS — F419 Anxiety disorder, unspecified: Secondary | ICD-10-CM | POA: Insufficient documentation

## 2016-08-05 DIAGNOSIS — K219 Gastro-esophageal reflux disease without esophagitis: Secondary | ICD-10-CM | POA: Insufficient documentation

## 2016-08-05 DIAGNOSIS — Z79899 Other long term (current) drug therapy: Secondary | ICD-10-CM | POA: Diagnosis not present

## 2016-08-05 DIAGNOSIS — M25512 Pain in left shoulder: Secondary | ICD-10-CM | POA: Diagnosis not present

## 2016-08-05 DIAGNOSIS — M545 Low back pain: Secondary | ICD-10-CM | POA: Diagnosis not present

## 2016-08-05 DIAGNOSIS — E785 Hyperlipidemia, unspecified: Secondary | ICD-10-CM | POA: Insufficient documentation

## 2016-08-05 DIAGNOSIS — G894 Chronic pain syndrome: Secondary | ICD-10-CM

## 2016-08-05 DIAGNOSIS — K589 Irritable bowel syndrome without diarrhea: Secondary | ICD-10-CM | POA: Insufficient documentation

## 2016-08-05 DIAGNOSIS — Z5181 Encounter for therapeutic drug level monitoring: Secondary | ICD-10-CM | POA: Insufficient documentation

## 2016-08-05 DIAGNOSIS — Z79891 Long term (current) use of opiate analgesic: Secondary | ICD-10-CM

## 2016-08-05 DIAGNOSIS — F17210 Nicotine dependence, cigarettes, uncomplicated: Secondary | ICD-10-CM | POA: Diagnosis not present

## 2016-08-05 DIAGNOSIS — Z7951 Long term (current) use of inhaled steroids: Secondary | ICD-10-CM | POA: Insufficient documentation

## 2016-08-05 DIAGNOSIS — Z8052 Family history of malignant neoplasm of bladder: Secondary | ICD-10-CM | POA: Insufficient documentation

## 2016-08-05 DIAGNOSIS — M1288 Other specific arthropathies, not elsewhere classified, other specified site: Secondary | ICD-10-CM

## 2016-08-05 DIAGNOSIS — M79604 Pain in right leg: Secondary | ICD-10-CM

## 2016-08-05 DIAGNOSIS — I10 Essential (primary) hypertension: Secondary | ICD-10-CM | POA: Diagnosis not present

## 2016-08-05 DIAGNOSIS — M25511 Pain in right shoulder: Secondary | ICD-10-CM | POA: Diagnosis not present

## 2016-08-05 DIAGNOSIS — E349 Endocrine disorder, unspecified: Secondary | ICD-10-CM

## 2016-08-05 DIAGNOSIS — R29898 Other symptoms and signs involving the musculoskeletal system: Secondary | ICD-10-CM

## 2016-08-05 DIAGNOSIS — M542 Cervicalgia: Secondary | ICD-10-CM

## 2016-08-05 DIAGNOSIS — M533 Sacrococcygeal disorders, not elsewhere classified: Secondary | ICD-10-CM | POA: Diagnosis not present

## 2016-08-05 DIAGNOSIS — M51369 Other intervertebral disc degeneration, lumbar region without mention of lumbar back pain or lower extremity pain: Secondary | ICD-10-CM

## 2016-08-05 DIAGNOSIS — Z981 Arthrodesis status: Secondary | ICD-10-CM | POA: Insufficient documentation

## 2016-08-05 DIAGNOSIS — M47816 Spondylosis without myelopathy or radiculopathy, lumbar region: Secondary | ICD-10-CM

## 2016-08-05 DIAGNOSIS — M25559 Pain in unspecified hip: Secondary | ICD-10-CM | POA: Diagnosis not present

## 2016-08-05 DIAGNOSIS — M488X6 Other specified spondylopathies, lumbar region: Secondary | ICD-10-CM | POA: Diagnosis not present

## 2016-08-05 DIAGNOSIS — M47812 Spondylosis without myelopathy or radiculopathy, cervical region: Secondary | ICD-10-CM

## 2016-08-05 DIAGNOSIS — M488X2 Other specified spondylopathies, cervical region: Secondary | ICD-10-CM | POA: Insufficient documentation

## 2016-08-05 DIAGNOSIS — E559 Vitamin D deficiency, unspecified: Secondary | ICD-10-CM | POA: Insufficient documentation

## 2016-08-05 DIAGNOSIS — J449 Chronic obstructive pulmonary disease, unspecified: Secondary | ICD-10-CM | POA: Insufficient documentation

## 2016-08-05 DIAGNOSIS — F329 Major depressive disorder, single episode, unspecified: Secondary | ICD-10-CM | POA: Insufficient documentation

## 2016-08-05 DIAGNOSIS — G8929 Other chronic pain: Secondary | ICD-10-CM

## 2016-08-05 DIAGNOSIS — E291 Testicular hypofunction: Secondary | ICD-10-CM | POA: Insufficient documentation

## 2016-08-05 DIAGNOSIS — M5136 Other intervertebral disc degeneration, lumbar region: Secondary | ICD-10-CM | POA: Diagnosis not present

## 2016-08-05 DIAGNOSIS — M5030 Other cervical disc degeneration, unspecified cervical region: Secondary | ICD-10-CM | POA: Diagnosis not present

## 2016-08-05 DIAGNOSIS — R531 Weakness: Secondary | ICD-10-CM | POA: Diagnosis not present

## 2016-08-05 DIAGNOSIS — M961 Postlaminectomy syndrome, not elsewhere classified: Secondary | ICD-10-CM | POA: Diagnosis not present

## 2016-08-05 DIAGNOSIS — M79605 Pain in left leg: Secondary | ICD-10-CM

## 2016-08-05 DIAGNOSIS — F1190 Opioid use, unspecified, uncomplicated: Secondary | ICD-10-CM

## 2016-08-05 NOTE — Progress Notes (Signed)
Safety precautions to be maintained throughout the outpatient stay will include: orient to surroundings, keep bed in low position, maintain call bell within reach at all times, provide assistance with transfer out of bed and ambulation.  

## 2016-08-05 NOTE — Patient Outreach (Signed)
[place] [facility]) Care Management  08/05/2016  Melvin Whiteford. Jan 11, 1956 [id number]   Transition of care  Spoke with Madelaine Bhat, hippa information verified.  Patient reports that they have finally moved most everything is still in boxes. Mrs.Basic discussed that in the process of moving that they have misplaced cord that attaches to concentrator to be able to fill tanks at home, she has placed a call to [facility] regarding the part, that she has ordered but states it will take up to 4 days to arrive. She is concerned that is patient has somewhere else to go he will not be able to due to not having oxygen in portable tank,, she is asking if there is any other way.   Mr.Dunleavy is doing pretty good around the house wearing his oxygen  but gets winded when walking so he uses the chair most of the time. Patient had his initial visit at pain clinic on today and will have follow up psychiatry as part of process.  Mr.Riege continues to discomfort at surgical area, not redness of swelling at site. Patient has follow up visit with PCP on next week.   Patient and wife request to give them another week to get settled before I visit.  Stressed importance with patient/wife for patient not to leave home without filled oxygen tank.   Plan Will place weekly transition of care call to patient on next week and scheduled home visit the next week.  Placed call to [facility] to follow up regarding best way for patient to get missing part to fill oxygen tank, they do not deliver to home filled tank, missing part is sold in retail store in [place], the part patient ordered is usually sent by [employer], Representative that I spoke with at [facility] will have retail store call Mrs.Pelot with information regarding part being sold in store. I placed return call to Mrs.Gal regarding my conversation with [facility].  [name], RN, [place] Management [phone]-  Mobile [phone]- Toll Free Main Office

## 2016-08-05 NOTE — Progress Notes (Signed)
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 well nourished, well hydrated and pale Vitals:   08/05/16 1406  BP: 106/76  Pulse: 95  Resp: 18  Temp: 97.4 F (36.3 C)  TempSrc: Oral  SpO2: 92%  Weight: 164 lb (74.4 kg)  Height: _0  (1.803 m)   BMI Assessment: Estimated body mass index is 22.87 kg/m as calculated from the following:   Height as of this encounter: _1  (1.803 m).   Weight as of this encounter: 164 lb (74.4 kg).  BMI interpretation table: BMI level Category Range association with higher incidence of chronic pain  <18 kg/m2 Underweight   18.5-24.9 kg/m2 Ideal body weight   25-29.9 kg/m2 Overweight Increased incidence by 20%   30-34.9 kg/m2 Obese (Class I) Increased incidence by 68%  35-39.9 kg/m2 Severe obesity (Class II) Increased incidence by 136%  >40 kg/m2 Extreme obesity (Class III) Increased incidence by 254%   BMI Readings from Last 4 Encounters:  08/05/16 22.87 kg/m  04/21/16 25.94 kg/m  04/06/16 26.00 kg/m  03/27/16 23.99 kg/m   Wt  Readings from Last 4 Encounters:  08/05/16 164 lb (74.4 kg)  04/21/16 186 lb (84.4 kg)  04/06/16 186 lb 6.4 oz (84.6 kg)  03/27/16 172 lb (78 kg)  Psych/Mental status: Alert, oriented x 3 (person, place, & time) Eyes: PERLA Respiratory: Oxygen-dependent COPD  Cervical Spine Exam  Inspection: Well healed scar from previous spine surgery detected Alignment: Symmetrical Functional ROM: Decreased ROM Stability: No instability detected Muscle strength & Tone: Functionally intact Sensory: Unimpaired Palpation: Complains of area being tender to palpation  Upper Extremity (UE) Exam    Side: Right upper extremity  Side: Left upper extremity  Inspection: No masses, redness, swelling, or asymmetry  Inspection: No masses, redness, swelling, or asymmetry  Functional ROM: Unrestricted ROM          Functional ROM: Unrestricted ROM          Muscle strength & Tone: Functionally intact  Muscle strength & Tone: Functionally intact  Sensory: Unimpaired  Sensory: Unimpaired  Palpation: Non-contributory  Palpation: Non-contributory   Thoracic Spine Exam  Inspection: No masses, redness, or swelling Alignment: Symmetrical Functional ROM: Unrestricted ROM Stability: No instability detected Sensory: Unimpaired Muscle strength & Tone: Functionally intact Palpation: Non-contributory  Lumbar Spine Exam  Inspection: No masses, redness, or swelling Alignment: Symmetrical Functional ROM: Unrestricted ROM Stability: No instability detected Muscle strength & Tone: Functionally intact Sensory: Unimpaired Palpation: Non-contributory Provocative Tests: Lumbar Hyperextension and  rotation test: evaluation deferred today       Patrick's Maneuver: evaluation deferred today              Gait & Posture Assessment  Ambulation: Patient came in today in a wheel chair, primarily due to shortness of breath and COPD. Gait: Relatively normal for age and body habitus Posture: Slouching   Lower Extremity Exam    Side: Right lower extremity  Side: Left lower extremity  Inspection: No masses, redness, swelling, or asymmetry  Inspection: No masses, redness, swelling, or asymmetry  Functional ROM: Unrestricted ROM          Functional ROM: Unrestricted ROM          Muscle strength & Tone: Functionally intact  Muscle strength & Tone: Functionally intact  Sensory: Unimpaired  Sensory: Unimpaired  Palpation: Non-contributory  Palpation: Non-contributory   Assessment  Primary Diagnosis & Pertinent Problem List: The primary encounter diagnosis was Chronic pain syndrome. Diagnoses of Cervical post-laminectomy syndrome, Cervical facet syndrome, DDD (degenerative disc disease), cervical, Chronic bilateral low back pain without sciatica, Facet syndrome, lumbar, DDD (degenerative disc disease), lumbar, Sacroiliac joint dysfunction, Long term current use of opiate analgesic, Opiate use, Long term prescription opiate use, Testosterone deficiency, Chronic hip pain, unspecified laterality, Chronic pain of both shoulders, Weakness of both lower extremities, Chronic neck pain (Location of Secondary source of pain) (Bilateral) (R>L), and Chronic lower extremity pain (Bilateral) (R>L) were also pertinent to this visit.  Visit Diagnosis: 1. Chronic pain syndrome   2. Cervical post-laminectomy syndrome   3. Cervical facet syndrome   4. DDD (degenerative disc disease), cervical   5. Chronic bilateral low back pain without sciatica   6. Facet syndrome, lumbar   7. DDD (degenerative disc disease), lumbar   8. Sacroiliac joint dysfunction   9. Long term current use of opiate analgesic   10. Opiate use    11. Long term prescription opiate use   12. Testosterone deficiency   13. Chronic hip pain, unspecified laterality   14. Chronic pain of both shoulders   15. Weakness of both lower  extremities   16. Chronic neck pain (Location of Secondary source of pain) (Bilateral) (R>L)   17. Chronic lower extremity pain (Bilateral) (R>L)    Plan of Care  Initial treatment plan:  Please be advised that as per protocol, today's visit has been an evaluation only. We have not taken over the patient's controlled substance management.  Problem-specific plan: No problem-specific Assessment & Plan notes found for this encounter.  Ordered Lab-work, Procedure(s), Referral(s), & Consult(s): Orders Placed This Encounter  Procedures  . DG Cervical Spine Complete  . DG Lumbar Spine Complete W/Bend  . DG HIP UNILAT W OR W/O PELVIS 2-3 VIEWS LEFT  . DG HIP UNILAT W OR W/O PELVIS 2-3 VIEWS RIGHT  . DG Si Joints  . DG Shoulder Left  . DG Shoulder Right  . Compliance Drug Analysis, Ur  . Comprehensive metabolic panel  . C-reactive protein  . Magnesium  . Sedimentation rate  . Vitamin B12  . 25-Hydroxyvitamin D Lcms D2+D3  . Ambulatory referral to Psychology  . NCV with EMG(electromyography)   Pharmacotherapy: Medications ordered:  No orders of the defined types were placed in this encounter.  Medications administered during this visit: Stanley Rios had no medications administered during this visit.   Pharmacotherapy under consideration:  Opioid Analgesics: The patient was informed that there is no guarantee that he would be a candidate for opioid analgesics. The decision will be made following CDC guidelines. This decision will be based on the results of diagnostic studies, as well as Stanley Rios risk profile.  Membrane stabilizer: To be determined at a later time Muscle relaxant: To be determined at a later time NSAID: To be determined at a later time Other analgesic(s): To be determined at a later  time   Interventional therapies under consideration: Stanley Rios was informed that there is no guarantee that he would be a candidate for interventional therapies. The decision will be based on the results of diagnostic studies, as well as Stanley Rios risk profile.  Possible procedure(s): Diagnostic bilateral lumbar facet block under fluoroscopic guidance and IV sedation  Diagnostic right-sided lumbar epidural steroid injection under fluoroscopic guidance Diagnostic bilateral cervical facet block under fluoroscopic guidance and IV sedation  Diagnostic right-sided cervical epidural steroid injection under fluoroscopic guidance  Diagnostic bilateral intra-articular shoulder joint injections  Diagnostic bilateral suprascapular nerve blocks    Provider-requested follow-up: Return for 2nd Visit, after [facility] evaluation.  Future Appointments Date Time Provider [place]  08/11/2016 2:45 PM Alfonzo Feller, RN [facility] None    Primary Care Physician: Marygrace Drought, MD Location: [facility] Out[facility] Pain Management Facility Note by: Kathlen Brunswick. Dossie Arbour, M.D, DABA, DABAPM, DABPM, DABIPP, FIPP Date: 08/05/16; Time: 4:15 PM  Pain Score Disclaimer: We use the NRS-11 scale. This is a self-reported, subjective measurement of pain severity with only modest accuracy. It is used primarily to identify changes within a particular patient. It must be understood that outpatient pain scales are significantly less accurate that those used for research, where they can be applied under ideal controlled circumstances with minimal exposure to variables. In reality, the score is likely to be a combination of pain intensity and pain affect, where pain affect describes the degree of emotional arousal or changes in action readiness caused by the sensory experience of pain. Factors such as social and work situation, setting, emotional state, anxiety levels, expectation, and prior pain experience may influence pain  perception and show large inter-individual differences that may also be affected by time variables.  Patient  instructions provided during this appointment: Patient Instructions  Please get your labs and x-rays done as soon as possible.

## 2016-08-05 NOTE — Patient Instructions (Signed)
Please get your labs and x-rays done as soon as possible.

## 2016-08-07 ENCOUNTER — Other Ambulatory Visit
Admission: RE | Admit: 2016-08-07 | Discharge: 2016-08-07 | Disposition: A | Discharge status: Home / Self Care | Payer: PPO | Source: Ambulatory Visit | Attending: Pain Medicine | Admitting: Pain Medicine

## 2016-08-07 ENCOUNTER — Ambulatory Visit
Admission: RE | Admit: 2016-08-07 | Discharge: 2016-08-07 | Disposition: A | Discharge status: Home / Self Care | Payer: PPO | Source: Ambulatory Visit | Attending: Pain Medicine | Admitting: Pain Medicine

## 2016-08-07 DIAGNOSIS — M25512 Pain in left shoulder: Secondary | ICD-10-CM | POA: Diagnosis not present

## 2016-08-07 DIAGNOSIS — M488X2 Other specified spondylopathies, cervical region: Secondary | ICD-10-CM | POA: Diagnosis not present

## 2016-08-07 DIAGNOSIS — M488X6 Other specified spondylopathies, lumbar region: Secondary | ICD-10-CM | POA: Diagnosis not present

## 2016-08-07 DIAGNOSIS — M533 Sacrococcygeal disorders, not elsewhere classified: Secondary | ICD-10-CM

## 2016-08-07 DIAGNOSIS — M25511 Pain in right shoulder: Secondary | ICD-10-CM

## 2016-08-07 DIAGNOSIS — R937 Abnormal findings on diagnostic imaging of other parts of musculoskeletal system: Secondary | ICD-10-CM | POA: Insufficient documentation

## 2016-08-07 DIAGNOSIS — Z981 Arthrodesis status: Secondary | ICD-10-CM | POA: Diagnosis not present

## 2016-08-07 DIAGNOSIS — M47816 Spondylosis without myelopathy or radiculopathy, lumbar region: Secondary | ICD-10-CM | POA: Diagnosis not present

## 2016-08-07 DIAGNOSIS — G894 Chronic pain syndrome: Secondary | ICD-10-CM | POA: Insufficient documentation

## 2016-08-07 DIAGNOSIS — M545 Low back pain: Secondary | ICD-10-CM

## 2016-08-07 DIAGNOSIS — M5136 Other intervertebral disc degeneration, lumbar region: Secondary | ICD-10-CM

## 2016-08-07 DIAGNOSIS — M5030 Other cervical disc degeneration, unspecified cervical region: Secondary | ICD-10-CM

## 2016-08-07 DIAGNOSIS — M25559 Pain in unspecified hip: Secondary | ICD-10-CM

## 2016-08-07 DIAGNOSIS — M47812 Spondylosis without myelopathy or radiculopathy, cervical region: Secondary | ICD-10-CM

## 2016-08-07 DIAGNOSIS — M542 Cervicalgia: Secondary | ICD-10-CM | POA: Diagnosis not present

## 2016-08-07 DIAGNOSIS — M25551 Pain in right hip: Secondary | ICD-10-CM | POA: Diagnosis not present

## 2016-08-07 DIAGNOSIS — G8929 Other chronic pain: Secondary | ICD-10-CM

## 2016-08-07 DIAGNOSIS — M25552 Pain in left hip: Secondary | ICD-10-CM | POA: Diagnosis not present

## 2016-08-07 LAB — COMPREHENSIVE METABOLIC PANEL
ALBUMIN: 3.6 g/dL (ref 3.5–5.0)
ALT: 16 U/L — ABNORMAL LOW (ref 17–63)
AST: 20 U/L (ref 15–41)
Alkaline Phosphatase: 80 U/L (ref 38–126)
Anion gap: 7 (ref 5–15)
BUN: 12 mg/dL (ref 6–20)
CO2: 27 mmol/L (ref 22–32)
Calcium: 8.9 mg/dL (ref 8.9–10.3)
Chloride: 108 mmol/L (ref 101–111)
Creatinine, Ser: 0.9 mg/dL (ref 0.61–1.24)
GFR calc Af Amer: >60 mL/min (ref >60)
GFR calc non Af Amer: >60 mL/min (ref >60)
Glucose, Bld: 97 mg/dL (ref 65–99)
POTASSIUM: 3.6 mmol/L (ref 3.5–5.1)
SODIUM: 142 mmol/L (ref 135–145)
Total Bilirubin: 0.6 mg/dL (ref 0.3–1.2)
Total Protein: 6.6 g/dL (ref 6.5–8.1)

## 2016-08-07 LAB — SEDIMENTATION RATE: Sed Rate: 24 mm/hr — ABNORMAL HIGH (ref 0–20)

## 2016-08-07 LAB — C-REACTIVE PROTEIN: CRP: <0.8 mg/dL (ref <1.0)

## 2016-08-07 LAB — MAGNESIUM: MAGNESIUM: 1.7 mg/dL (ref 1.7–2.4)

## 2016-08-07 LAB — VITAMIN B12: Vitamin B-12: 176 pg/mL — ABNORMAL LOW (ref 180–914)

## 2016-08-09 DIAGNOSIS — M79604 Pain in right leg: Secondary | ICD-10-CM

## 2016-08-09 DIAGNOSIS — M79605 Pain in left leg: Secondary | ICD-10-CM

## 2016-08-09 DIAGNOSIS — G8929 Other chronic pain: Secondary | ICD-10-CM | POA: Insufficient documentation

## 2016-08-09 DIAGNOSIS — M542 Cervicalgia: Secondary | ICD-10-CM

## 2016-08-10 LAB — 25-HYDROXYVITAMIN D LCMS D2+D3
25-HYDROXY, VITAMIN D-2: 20 ng/mL
25-HYDROXY, VITAMIN D-3: 7 ng/mL

## 2016-08-10 LAB — 25-HYDROXY VITAMIN D LCMS D2+D3: 25-Hydroxy, Vitamin D: 27 ng/mL — ABNORMAL LOW

## 2016-08-11 ENCOUNTER — Other Ambulatory Visit: Payer: Self-pay | Admitting: *Deleted

## 2016-08-11 NOTE — Patient Outreach (Signed)
[facility]) Care Management  08/11/2016  Ashish Rossetti. 02-Jan-1956 [id number]   Placed transition of care call, unsuccessful able to leave a hipaa compliant message requesting a return call.  Plan Will await return call, if no response will place call again to patient in the next 3 days.  [name], RN, [place] Management [phone]- Mobile [phone]- Toll Free Main Office

## 2016-08-13 ENCOUNTER — Other Ambulatory Visit: Payer: Self-pay | Admitting: *Deleted

## 2016-08-13 DIAGNOSIS — R0603 Acute respiratory distress: Secondary | ICD-10-CM | POA: Diagnosis not present

## 2016-08-13 DIAGNOSIS — J189 Pneumonia, unspecified organism: Secondary | ICD-10-CM | POA: Diagnosis not present

## 2016-08-13 DIAGNOSIS — J449 Chronic obstructive pulmonary disease, unspecified: Secondary | ICD-10-CM | POA: Diagnosis not present

## 2016-08-13 NOTE — Patient Outreach (Signed)
[facility]) Care Management  08/13/2016  Ceylon Arenson. 03-Mar-1956 [id number]  Placed call to patient at all  listed telephone numbers , no answer, able to leave a hipaa compliant message on one line.  Plan Will await return call, will attempt contact by telephone prior to scheduled home visit in next week.   [name], RN, [place] Management [phone]- Mobile [phone]- Toll Free Main Office

## 2016-08-15 LAB — COMPLIANCE DRUG ANALYSIS, UR

## 2016-08-18 DIAGNOSIS — G608 Other hereditary and idiopathic neuropathies: Secondary | ICD-10-CM | POA: Diagnosis not present

## 2016-08-19 ENCOUNTER — Ambulatory Visit: Payer: Self-pay | Admitting: *Deleted

## 2016-08-19 ENCOUNTER — Other Ambulatory Visit: Payer: Self-pay | Admitting: *Deleted

## 2016-08-19 DIAGNOSIS — F4542 Pain disorder with related psychological factors: Secondary | ICD-10-CM | POA: Diagnosis not present

## 2016-08-19 NOTE — Patient Outreach (Signed)
[facility]) Care Management  08/19/2016  Stanley Rios. [birth year] [id number]   Successful call for final transition of care call .  Spoke with [name], she discussed patient continues to have discomfort at surgical area and notices at times clicking at his diaphragm at times. She discussed she had notified Dr.Hatithcock at surgical office and informed to follow up with PCP for referral, [name] has appointment with PCP on 2/1 and has requested to be notified if sooner appointment available.  Patient continues to wear his oxygen as prescribed, without increase shortness unless with activity increase, denies patient have increased cough or sputum production. Patient was able to locate tubing at home used to fill his oxygen tank to use for transport when outside the home.  Patient has appointment with Psych follow up today  as part of evaluation process for pain clinic doctor, patient is able to drive himself.     Plan Will schedule home visit for next week since patient has moved. Will discuss follow up regarding grab bars being installed will update [place] will notify MD of worsening of breathing, increased cough, pain or sputum production.   [name], RN, [place] Management [phone] Mobile [phone]- Toll Free Main Office

## 2016-08-25 ENCOUNTER — Other Ambulatory Visit: Payer: Self-pay | Admitting: *Deleted

## 2016-08-25 NOTE — Patient Outreach (Addendum)
[facility]) Care Management   08/25/2016  Stanley Rios. 04/27/1956 [id number]  Stanley Rios. is an 61 y.o. male  Subjective:  Patient discussed he has completed all of screening necessary  for entering in pain management treatment program has next appointment in March.  Patient reports his breathing has been doing good, does not  wearing oxygen while resting in chair, but when he gets up he puts it on . Patient discussed tolerating walking around in home and tolerating okay except for back discomfort. StanleyRios discussed concern regarding cost of monthly electric bill and believes large concentrator is to blame and turns it off during the day, and plans to call advanced home care for smaller version. Patients states he has an appointment with PCP on this week and will discuss discomfort he is still having at left abdominal area and he points to bulged out area.     Objective:  BP 114/76 (BP Location: Left Arm, Patient Position: Sitting, Cuff Size: Large)   Pulse 92   Resp 10   SpO2 94%  Review of Systems  Constitutional: Negative.   HENT: Negative.   Eyes: Negative.   Respiratory: Positive for shortness of breath.        Sometimes shortness breath with walking without oxygen.  Cardiovascular: Negative.  Negative for chest pain and leg swelling.  Gastrointestinal: Negative.   Genitourinary: Negative.   Musculoskeletal: Positive for back pain.  Skin: Negative.   Neurological: Negative.   Endo/Heme/Allergies: Negative.   Psychiatric/Behavioral: Negative.     Physical Exam  Constitutional: He is oriented to person, place, and time. He appears well-developed and well-nourished.  Cardiovascular: Normal rate and normal heart sounds.   Respiratory: He has decreased breath sounds in the right lower field and the left lower field.  Decreased breath sounds at bases   GI: Soft.    Neurological: He is alert and oriented to person, place, and time.    Skin: Skin is warm and dry.  Psychiatric: He has a normal mood and affect. His speech is normal and behavior is normal. Judgment and thought content normal. Cognition and memory are normal.    Encounter Medications:   Outpatient Encounter Prescriptions as of 08/25/2016  Medication Sig Note  . albuterol (PROVENTIL HFA;VENTOLIN HFA) 108 (90 Base) MCG/ACT inhaler Inhale 2 puffs into the lungs every 4 (four) hours as needed for wheezing or shortness of breath. Reported on 10/26/2015   . buPROPion (WELLBUTRIN) 75 MG tablet Take 75 mg by mouth 2 (two) times daily. Reported on 10/26/2015   . cyclobenzaprine (FLEXERIL) 10 MG tablet Take 10 mg by mouth 3 (three) times daily as needed for muscle spasms.   Marland Kitchen diltiazem (CARDIZEM SR) 90 MG 12 hr capsule Take 1 capsule (90 mg total) by mouth every 12 (twelve) hours.   . DULoxetine (CYMBALTA) 60 MG capsule Take 60 mg by mouth daily. Reported on 10/26/2015   . finasteride (PROSCAR) 5 MG tablet Take 5 mg by mouth daily.   . fluticasone (FLONASE) 50 MCG/ACT nasal spray SHAKE LIQUID AND USE 2 SPRAYS IN EACH NOSTRIL DAILY   . Fluticasone-Salmeterol (ADVAIR) 250-50 MCG/DOSE AEPB Inhale 1 puff into the lungs 2 (two) times daily. Reported on 10/26/2015   . furosemide (LASIX) 20 MG tablet Take 1 tablet (20 mg total) by mouth daily. For 1 week and then when necessary as needed for swelling and edema   . gabapentin (NEURONTIN) 400 MG capsule TAKE 2 CAPSULES(800 MG) BY MOUTH THREE  TIMES DAILY   . ipratropium (ATROVENT HFA) 17 MCG/ACT inhaler Inhale 2 puffs into the lungs every 6 (six) hours.   Marland Kitchen ipratropium-albuterol (DUONEB) 0.5-2.5 (3) MG/3ML SOLN Take 3 mLs by nebulization every 6 (six) hours as needed.   . metoprolol tartrate (LOPRESSOR) 25 MG tablet Take 1 tablet (25 mg total) by mouth 2 (two) times daily.   . nicotine (NICODERM CQ - DOSED IN MG/24 HOURS) 21 mg/24hr patch Place 21 mg onto the skin daily.    Marland Kitchen omeprazole (PRILOSEC) 40 MG capsule Take 40 mg by mouth 2  (two) times daily.   . predniSONE (DELTASONE) 10 MG tablet    . simvastatin (ZOCOR) 20 MG tablet Take 20 mg by mouth at bedtime.    . tamsulosin (FLOMAX) 0.4 MG CAPS capsule Take 0.4 mg by mouth daily.   Marland Kitchen tiotropium (SPIRIVA) 18 MCG inhalation capsule Place 18 mcg into inhaler and inhale daily. Reported on 10/26/2015   . traMADol (ULTRAM) 50 MG tablet Take 50 mg by mouth every 6 (six) hours as needed.   . traZODone (DESYREL) 50 MG tablet Take 150 mg by mouth at bedtime.   . baclofen (LIORESAL) 10 MG tablet Take 10 mg by mouth 2 (two) times daily as needed for muscle spasms.  08/25/2016: Not taking   . celecoxib (CELEBREX) 200 MG capsule Take 200 mg by mouth daily.  08/25/2016: Not taking   . enalapril (VASOTEC) 5 MG tablet Take 5 mg by mouth daily. 05/01/2016: Not taking  . lidocaine (XYLOCAINE) 5 % ointment Apply 1 application topically See admin instructions. 2 to 4 times daily   . naloxone (NARCAN) 2 MG/2ML injection Inject 2 mLs into the muscle as needed. For side effect/adverse reaction/opioid overdose 05/01/2016: Not taking  . naproxen sodium (ANAPROX) 220 MG tablet Take 220 mg by mouth 2 (two) times daily as needed.   . polyethylene glycol (MIRALAX / GLYCOLAX) packet Take 17 g by mouth daily.   . potassium chloride SA (K-DUR,KLOR-CON) 20 MEQ tablet Take 1 tablet (20 mEq total) by mouth daily. When ever the furosemide is taken (Patient not taking: Reported on 08/25/2016)   . Vitamin D, Ergocalciferol, (DRISDOL) 50000 units CAPS capsule TAKE 1 CAPSULE BY MOUTH 1 TIME A WEEK    No facility-administered encounter medications on file as of 08/25/2016.     Functional Status:   In your present state of health, do you have any difficulty performing the following activities: 08/25/2016 07/29/2016  Hearing? N N  Vision? N N  Difficulty concentrating or making decisions? N N  Walking or climbing stairs? Y Y  Dressing or bathing? Y Y  Doing errands, shopping? Stanley Rios  Preparing Food and eating ? Y N    Using the Toilet? N N  In the past six months, have you accidently leaked urine? N N  Do you have problems with loss of bowel control? N N  Managing your Medications? Y Y  Managing your Finances? Y N  Housekeeping or managing your Housekeeping? Stanley Rios  Some recent data might be hidden    Fall/Depression Screening:    PHQ 2/9 Scores 08/25/2016 08/05/2016 07/29/2016 05/01/2016 03/27/2016  PHQ - 2 Score 1 0 1 2 0  PHQ- 9 Score - - - 3 -   Fall Risk  08/25/2016 08/05/2016 07/29/2016 05/01/2016 05/01/2016  Falls in the past year? No No No - No  Risk for fall due to : - - Impaired balance/gait (No Data) Impaired balance/gait  Risk for fall  due to (comments): - - using rolling walker uses rolling walker fall prevention discussed   Assessment:  Home visit with patient at his new location . Home very organized.  Patient and wife are now ready to have grab bar placed at home and tub is possible to help with safety.  Pain  Taking tramadol with relief and rest also helps. To begin management at pain center.   COPD In green zone, wearing oxygen when active or leaving home, keeps oxygen off when at rest at home. Patient continues to state he is not smoking. Reviewed importance of keeping concentrator on for safety.    Fall Safety / Home Evaluation  Using cane for ambulation , will benefit from grab bars around shower   Plan:  Will follow up with patient in the next month Will discuss with social worker regarding community resource for [employer] in shower Provide patient with handout on 211 for resource Send visit note to PCP, quarterly update.   [facility] CM Care Plan Problem One   Flowsheet Row Most Recent Value  Care Plan Problem One  High risk for readmission as evidenced by recent admission   Role Documenting the Problem One  Care Management [place] for Problem One  Active  [facility] Long Term Goal (31-90 days)  Patient will not experience a hospital admission in the next 60  days   [updated goal ]  [facility] Long Term Goal Start Date  07/07/16  Interventions for Problem One Long Term Goal  Encouraged to keep upcoming appointment with PCP.   [facility] CM Short Term Goal #1 (0-30 days)  Patient will attend MD/Surgeon follow up appointment in the next 7 days   [facility] CM Short Term Goal #1 Start Date  07/07/16  [facility] CM Short Term Goal #1 Met Date  07/16/16  [facility] CM Short Term Goal #2 (0-30 days)  Patient will adhere to activity and weight limit restrictions in the next 14 days   [facility] CM Short Term Goal #2 Start Date  07/07/16  [facility] CM Short Term Goal #2 Met Date  08/05/16  Interventions for Short Term Goal #2  Reminded patient of importance of not lifting more than 10 lbs   [facility] CM Short Term Goal #3 (0-30 days)  Patient will be able to continue to state worsenig symptoms of COPD to notify MD in the next 30 days    [facility] CM Short Term Goal #3 Start Date  08/19/16  Interventions for Short Tern Goal #3  Discussed patient current zone able to indentify based on symptoms     [name], RN, [place] Management [phone]- Mobile [phone]- [place]

## 2016-08-28 DIAGNOSIS — J439 Emphysema, unspecified: Secondary | ICD-10-CM | POA: Diagnosis not present

## 2016-08-28 DIAGNOSIS — K449 Diaphragmatic hernia without obstruction or gangrene: Secondary | ICD-10-CM | POA: Diagnosis not present

## 2016-08-28 IMAGING — CT CT HEAD W/O CM
3 series · 15 of 47 positions shown, 18 images · non-contrast
Comparison: CT carotid arteries 05/26/2013

CLINICAL DATA: Altered mental status. Sluggish and appears
fatigued. Decreased blood pressure during triage.

EXAM:
CT HEAD WITHOUT CONTRAST
TECHNIQUE: Contiguous axial images were obtained from the base of the skull
through the vertex without intravenous contrast.

[Series 2: head wo · axial · 0.47mm/px · z∈[-118,+12]mm · 9 of 32 slices shown, 12 images]
[im 3/32  brain]
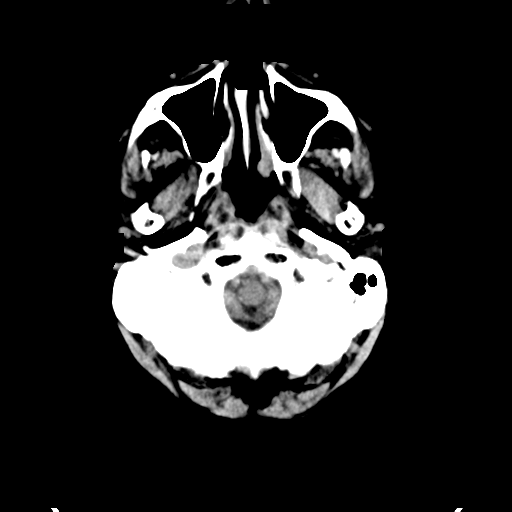
[im 3/32  bone]
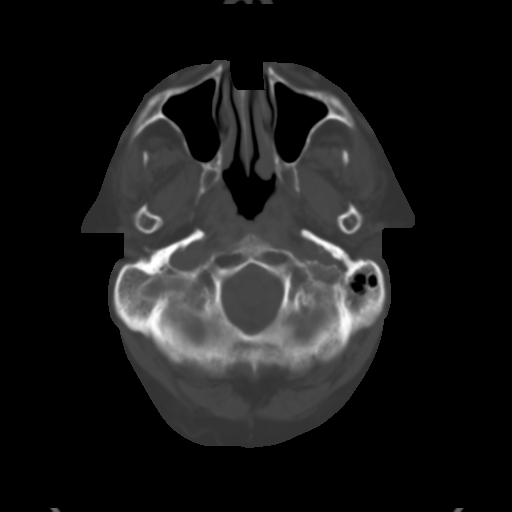
[im 6/32  brain]
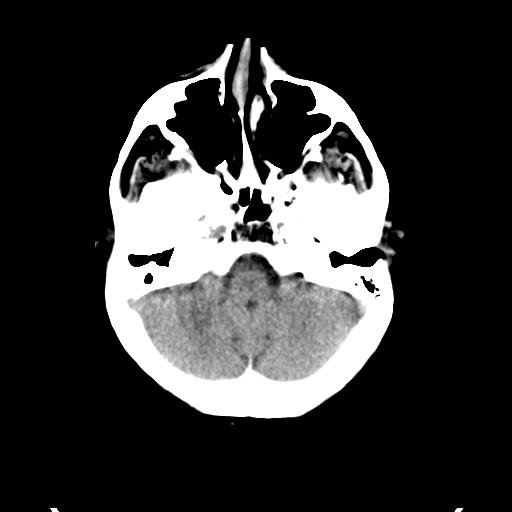
[im 9/32  brain]
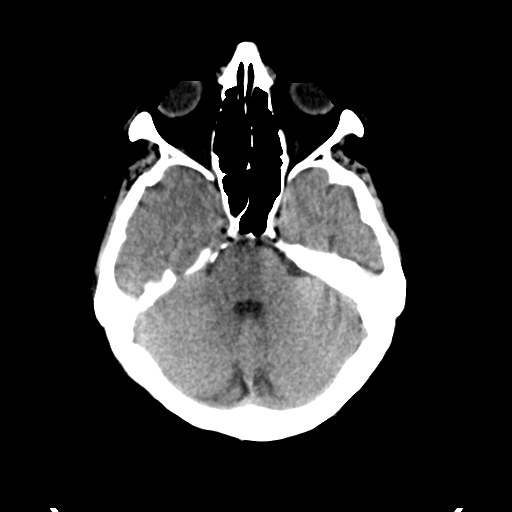
[im 12/32  brain]
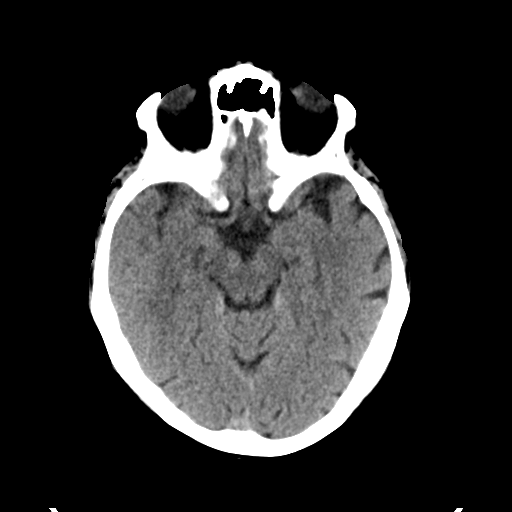
[im 17/32  brain]
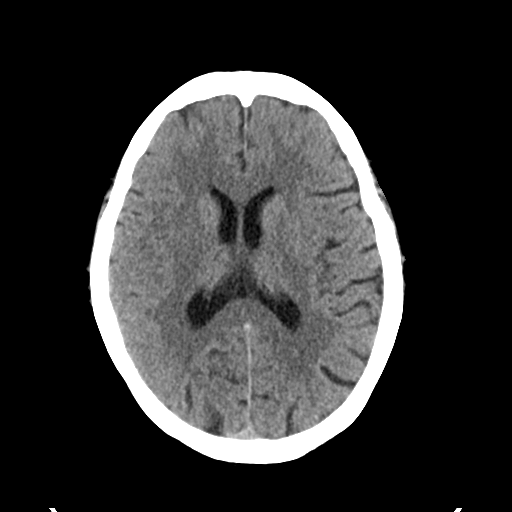
[im 17/32  bone]
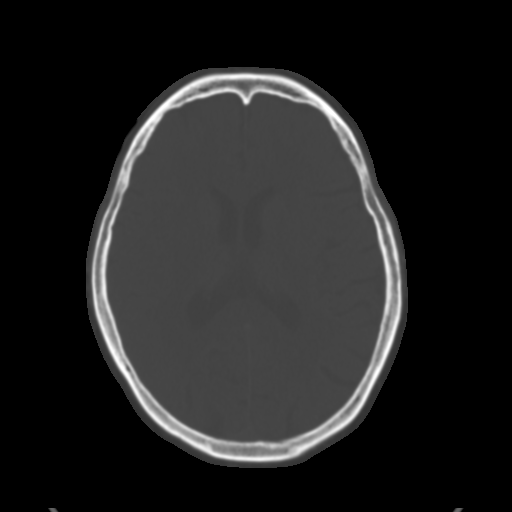
[im 20/32  brain]
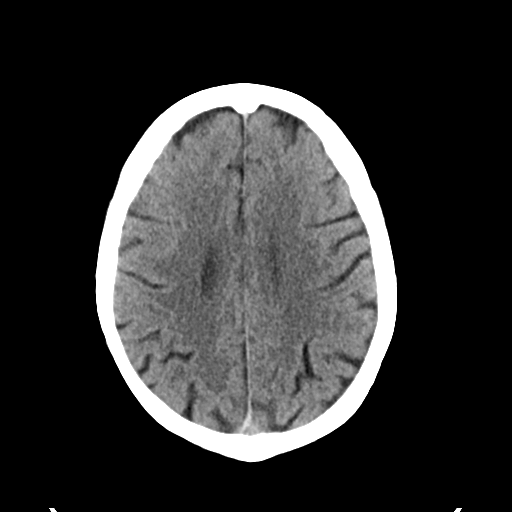
[im 23/32  brain]
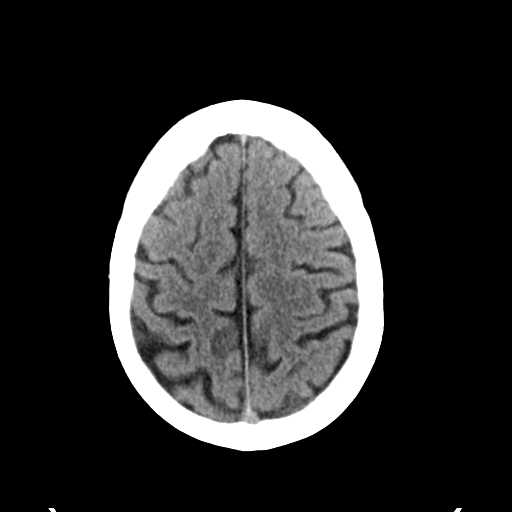
[im 26/32  brain]
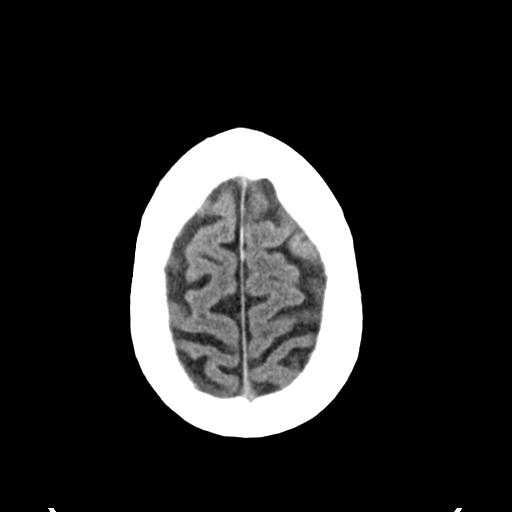
[im 29/32  brain]
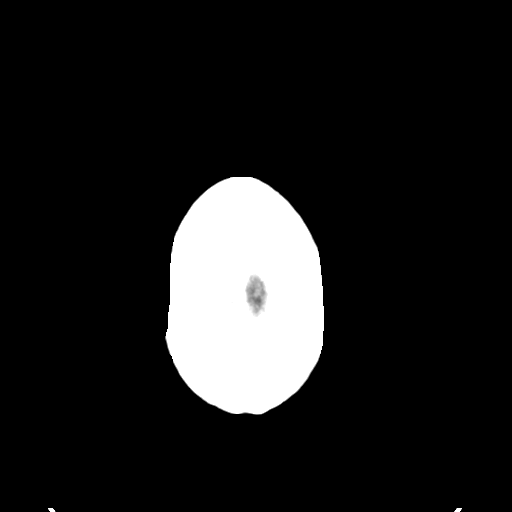
[im 29/32  bone]
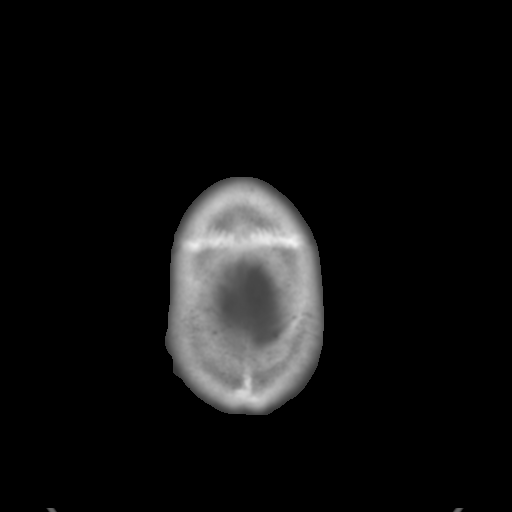

[Series 4: coronal soft tissue · coronal · 0.31mm/px · 3 of 61 slices shown]
[im 21/61  brain]
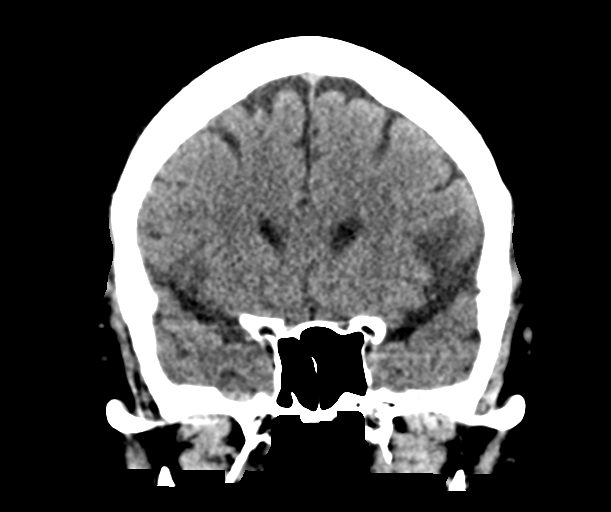
[im 27/61  brain]
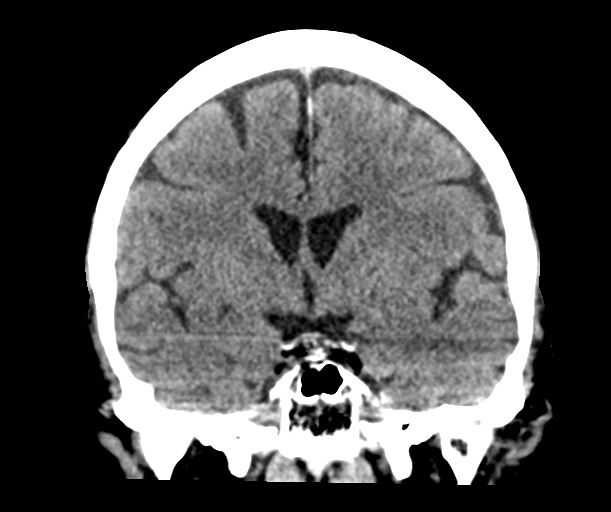
[im 34/61  brain]
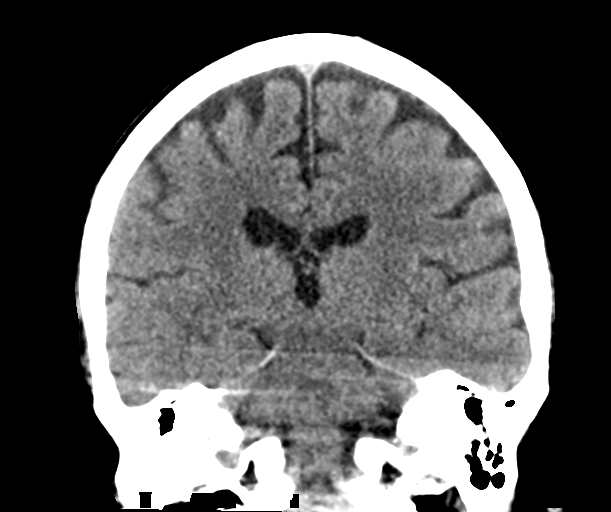

[Series 5: sagittal soft tissue · sagittal · 0.32mm/px · 3 of 52 slices shown]
[im 18/52  brain]
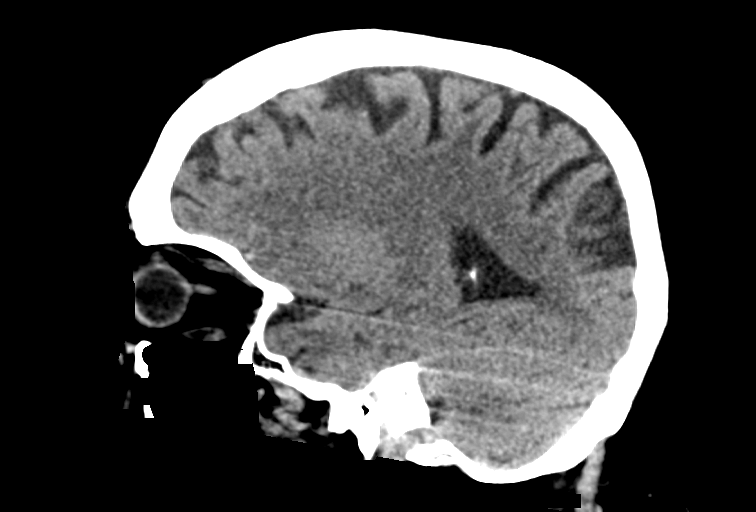
[im 26/52  brain]
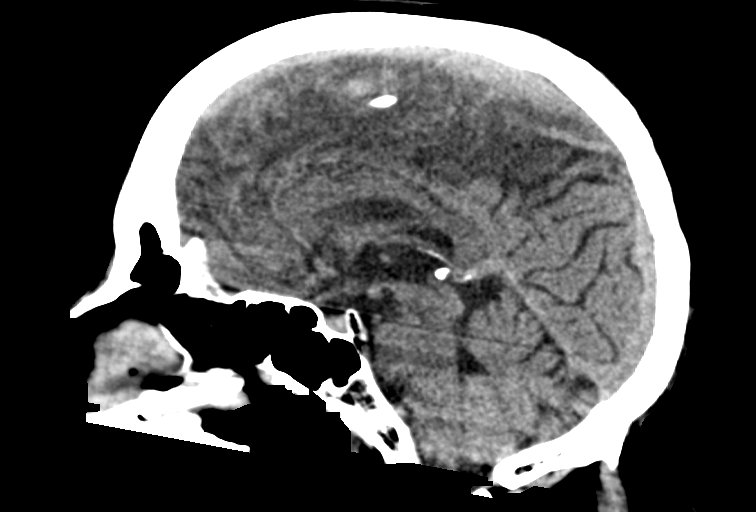
[im 35/52  brain]
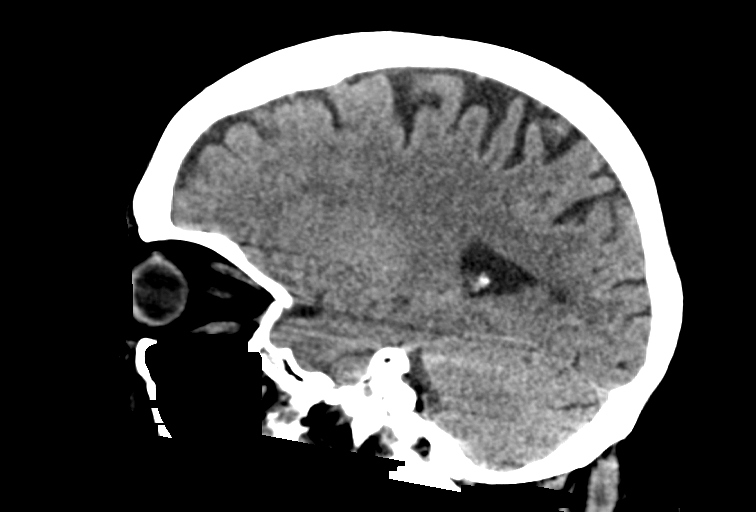

[15 of 47 positions shown; findings below may reference images not displayed]

FINDINGS: Brain: Ventricles and sulci appear symmetrical. No ventricular
dilatation. No mass effect or midline shift. No abnormal extra-axial
fluid collections. Gray-white matter junctions are distinct. Basal
cisterns are not effaced. No evidence of acute intracranial
hemorrhage.

Vascular: No hyperdense vessel or unexpected calcification.

Skull: No depressed skull fractures.

Sinuses/Orbits: Visualized paranasal sinuses and mastoid air cells
are not opacified.

Other: None.
IMPRESSION: No acute intracranial abnormalities.

## 2016-08-29 DIAGNOSIS — K449 Diaphragmatic hernia without obstruction or gangrene: Secondary | ICD-10-CM | POA: Insufficient documentation

## 2016-09-02 NOTE — Progress Notes (Signed)
NOTE: This forensic urine drug screen (UDS) test was conducted using a state-of-the-art ultra high performance liquid chromatography and mass spectrometry system (UPLC/MS-MS), the most sophisticated and accurate method available. UPLC/MS-MS is 1,000 times more precise and accurate than standard gas chromatography and mass spectrometry (GC/MS). This system can analyze 26 drug categories and 180 drug compounds.  Unreported substance: Tramadol  The findings of this UDT were reported as abnormal due to inconsistencies with expected results. An unreported substance was identified in the sample. Expectations were based on the medication history provided by the patient at the time of sample collection.  These results may suggest one of the following possibilities:  1). The use of multiple providers, suggesting the illegal practice of "Doctor Shopping", in violation of [place] Statutes, as well as our medication agreement.  2). The use of unsanctioned and possibly illegal substances, in violation of [place] Statutes, as well as our medication agreement. 3). Inaccurate list of reported substances.

## 2016-09-09 ENCOUNTER — Other Ambulatory Visit: Payer: Self-pay | Admitting: *Deleted

## 2016-09-09 DIAGNOSIS — J449 Chronic obstructive pulmonary disease, unspecified: Secondary | ICD-10-CM | POA: Diagnosis not present

## 2016-09-09 DIAGNOSIS — K458 Other specified abdominal hernia without obstruction or gangrene: Secondary | ICD-10-CM | POA: Diagnosis not present

## 2016-09-09 DIAGNOSIS — J189 Pneumonia, unspecified organism: Secondary | ICD-10-CM | POA: Diagnosis not present

## 2016-09-09 NOTE — Patient Outreach (Signed)
[place] [facility]) Care Management  09/09/2016  Chantry Headen. 1956/02/06 [id number]   Follow up telephone call   Spoke with Mrs.Spallone, discussed patient recent visit to PCP office and concern regarding reoccurrence of diaphragm hernia with patient still having bulging at  left flank lateral abdominal area and discomfort at area. Mrs.Spratt discussed they asked for a second opinion and has a referral appointment with MD at [facility] on 2/21, she discussed possibly of patient having additional surgery.   Mr.Daubert denies any having increase in shortness of breath continues to wear oxygen intermittently during the day at all night.   Mrs.Munguia discussed concern regarding need for grab bar at tub and around commode, informed her that I had update [place] of them being settled in new location and ready for bars to be installed.  Mrs.Reidel discussed wanting an additional foam mattress to add to patient's hospital bed that has a gel overlay mattress to increase comfort and ease of patient moving in bed. She is searching the Internet for mattress and prices, not sure they can afford.     Plan Will continue to follow patient for complex care management , will schedule  call in the next to follow up on results from his visit to [facility]. In basket message has been sent to [facility], LCSW regarding grab bar rail installment as previously planned .   [name], RN, [place] Management [phone]- Mobile [phone]- Toll Free Main Office

## 2016-09-11 ENCOUNTER — Other Ambulatory Visit: Payer: Self-pay | Admitting: *Deleted

## 2016-09-11 NOTE — Patient Outreach (Signed)
[name]) Care Management  09/11/2016  Stanley Rios. Jun 20, 1956 [id number]   Phone call to [name] through [facility] to request assistance with putting grab bars in patient's bathroom to avoid falls. HIPPA compliant voicemail message left for a return call.   [name] [facility] Care Management [phone]

## 2016-09-12 DIAGNOSIS — Z87891 Personal history of nicotine dependence: Secondary | ICD-10-CM | POA: Diagnosis not present

## 2016-09-12 DIAGNOSIS — Z9889 Other specified postprocedural states: Secondary | ICD-10-CM | POA: Diagnosis not present

## 2016-09-12 DIAGNOSIS — G8929 Other chronic pain: Secondary | ICD-10-CM | POA: Diagnosis not present

## 2016-09-12 DIAGNOSIS — M549 Dorsalgia, unspecified: Secondary | ICD-10-CM | POA: Diagnosis not present

## 2016-09-12 DIAGNOSIS — K449 Diaphragmatic hernia without obstruction or gangrene: Secondary | ICD-10-CM | POA: Diagnosis not present

## 2016-09-12 DIAGNOSIS — J449 Chronic obstructive pulmonary disease, unspecified: Secondary | ICD-10-CM | POA: Diagnosis not present

## 2016-09-13 DIAGNOSIS — J449 Chronic obstructive pulmonary disease, unspecified: Secondary | ICD-10-CM | POA: Diagnosis not present

## 2016-09-13 DIAGNOSIS — R0603 Acute respiratory distress: Secondary | ICD-10-CM | POA: Diagnosis not present

## 2016-09-18 ENCOUNTER — Ambulatory Visit: Payer: PPO | Admitting: *Deleted

## 2016-09-19 ENCOUNTER — Other Ambulatory Visit: Payer: Self-pay | Admitting: *Deleted

## 2016-09-19 NOTE — Patient Outreach (Signed)
[place] [facility]) Care Management  09/19/2016  Stanley Rios. [birth year] [id number]  Follow up telephone call  1020 Placed call to patient , no answer by phone able to leave a hipaa compliant message requesting a return call .   Plan Await return call if no response , will schedule return call in the next week.   [name], RN, [place] Management [phone]- Mobile [phone]- Toll Free Main Office

## 2016-09-22 ENCOUNTER — Other Ambulatory Visit: Payer: Self-pay | Admitting: *Deleted

## 2016-09-22 NOTE — Patient Outreach (Signed)
[place] [facility]) Care Management  09/22/2016  Stanley Rios. 18-Feb-1956 [id number]   Received return call from [place], she reports patient is doing pretty good, no increase in shortness of breath or cough, he continues to wear his oxygen at 2 liters at night and on and off during the day and he is taking his medications as prescribed.  She discussed patient's recent visit to Pulmonary doctor in [place] and surgery.  She is discussed patient will require additional reconstruction  surgery related to mesh at previous hernia repair, she states this is planned for March and is unsure of the current dates.  [name] will have follow up visit at pain clinic on this week.  Will continue to follow patient for community care management with pending surgery in the next few weeks at [facility]. [name] discussed concern related to needing an additional foam mattress for his bed for increased comfort and support after surgery.She continues to discuss cost of living expenses and seeking other options to reduce cost.   She discussed patient is still able to take a shower with  assistance from her helping him in the tub,  updated her that [place], [facility] LCSW is making contact with community resource regarding grab bars in bathroom.    Plan Will plan follow up telephone call in the next 2 weeks  Will update [place] , LCSW regarding patient situation  Discuss 211 community resource and contact information with caregiver.  [name], RN, [place] Management [phone]- Mobile [phone]- Toll Free Main Office

## 2016-09-25 ENCOUNTER — Ambulatory Visit: Payer: PPO | Attending: Pain Medicine | Admitting: Pain Medicine

## 2016-09-25 ENCOUNTER — Encounter: Payer: Self-pay | Admitting: Pain Medicine

## 2016-09-25 VITALS — BP 131/78 | HR 119 | Temp 97.9°F | Resp 16 | Ht 67.0 in | Wt 187.0 lb

## 2016-09-25 DIAGNOSIS — Z87891 Personal history of nicotine dependence: Secondary | ICD-10-CM | POA: Insufficient documentation

## 2016-09-25 DIAGNOSIS — J439 Emphysema, unspecified: Secondary | ICD-10-CM | POA: Diagnosis not present

## 2016-09-25 DIAGNOSIS — E785 Hyperlipidemia, unspecified: Secondary | ICD-10-CM | POA: Diagnosis not present

## 2016-09-25 DIAGNOSIS — M488X6 Other specified spondylopathies, lumbar region: Secondary | ICD-10-CM | POA: Diagnosis not present

## 2016-09-25 DIAGNOSIS — E538 Deficiency of other specified B group vitamins: Secondary | ICD-10-CM | POA: Insufficient documentation

## 2016-09-25 DIAGNOSIS — M542 Cervicalgia: Secondary | ICD-10-CM

## 2016-09-25 DIAGNOSIS — M1288 Other specific arthropathies, not elsewhere classified, other specified site: Secondary | ICD-10-CM

## 2016-09-25 DIAGNOSIS — I10 Essential (primary) hypertension: Secondary | ICD-10-CM | POA: Diagnosis not present

## 2016-09-25 DIAGNOSIS — M25512 Pain in left shoulder: Secondary | ICD-10-CM

## 2016-09-25 DIAGNOSIS — M7918 Myalgia, other site: Secondary | ICD-10-CM | POA: Insufficient documentation

## 2016-09-25 DIAGNOSIS — E559 Vitamin D deficiency, unspecified: Secondary | ICD-10-CM | POA: Diagnosis not present

## 2016-09-25 DIAGNOSIS — G8929 Other chronic pain: Secondary | ICD-10-CM

## 2016-09-25 DIAGNOSIS — K449 Diaphragmatic hernia without obstruction or gangrene: Secondary | ICD-10-CM | POA: Insufficient documentation

## 2016-09-25 DIAGNOSIS — R531 Weakness: Secondary | ICD-10-CM | POA: Diagnosis not present

## 2016-09-25 DIAGNOSIS — M5136 Other intervertebral disc degeneration, lumbar region: Secondary | ICD-10-CM | POA: Insufficient documentation

## 2016-09-25 DIAGNOSIS — M545 Low back pain: Secondary | ICD-10-CM

## 2016-09-25 DIAGNOSIS — F329 Major depressive disorder, single episode, unspecified: Secondary | ICD-10-CM | POA: Diagnosis not present

## 2016-09-25 DIAGNOSIS — M1990 Unspecified osteoarthritis, unspecified site: Secondary | ICD-10-CM | POA: Diagnosis not present

## 2016-09-25 DIAGNOSIS — G894 Chronic pain syndrome: Secondary | ICD-10-CM | POA: Insufficient documentation

## 2016-09-25 DIAGNOSIS — M5030 Other cervical disc degeneration, unspecified cervical region: Secondary | ICD-10-CM | POA: Insufficient documentation

## 2016-09-25 DIAGNOSIS — M792 Neuralgia and neuritis, unspecified: Secondary | ICD-10-CM | POA: Diagnosis not present

## 2016-09-25 DIAGNOSIS — F419 Anxiety disorder, unspecified: Secondary | ICD-10-CM | POA: Insufficient documentation

## 2016-09-25 DIAGNOSIS — M150 Primary generalized (osteo)arthritis: Secondary | ICD-10-CM | POA: Diagnosis not present

## 2016-09-25 DIAGNOSIS — M961 Postlaminectomy syndrome, not elsewhere classified: Secondary | ICD-10-CM | POA: Insufficient documentation

## 2016-09-25 DIAGNOSIS — K589 Irritable bowel syndrome without diarrhea: Secondary | ICD-10-CM | POA: Insufficient documentation

## 2016-09-25 DIAGNOSIS — Z981 Arthrodesis status: Secondary | ICD-10-CM | POA: Insufficient documentation

## 2016-09-25 DIAGNOSIS — Z79899 Other long term (current) drug therapy: Secondary | ICD-10-CM | POA: Insufficient documentation

## 2016-09-25 DIAGNOSIS — G4700 Insomnia, unspecified: Secondary | ICD-10-CM | POA: Insufficient documentation

## 2016-09-25 DIAGNOSIS — J449 Chronic obstructive pulmonary disease, unspecified: Secondary | ICD-10-CM | POA: Diagnosis not present

## 2016-09-25 DIAGNOSIS — Z7951 Long term (current) use of inhaled steroids: Secondary | ICD-10-CM | POA: Insufficient documentation

## 2016-09-25 DIAGNOSIS — M159 Polyosteoarthritis, unspecified: Secondary | ICD-10-CM | POA: Insufficient documentation

## 2016-09-25 DIAGNOSIS — M25511 Pain in right shoulder: Secondary | ICD-10-CM

## 2016-09-25 DIAGNOSIS — Z79891 Long term (current) use of opiate analgesic: Secondary | ICD-10-CM | POA: Diagnosis not present

## 2016-09-25 DIAGNOSIS — M791 Myalgia: Secondary | ICD-10-CM | POA: Diagnosis not present

## 2016-09-25 DIAGNOSIS — M533 Sacrococcygeal disorders, not elsewhere classified: Secondary | ICD-10-CM | POA: Insufficient documentation

## 2016-09-25 DIAGNOSIS — Z8052 Family history of malignant neoplasm of bladder: Secondary | ICD-10-CM | POA: Insufficient documentation

## 2016-09-25 DIAGNOSIS — M47816 Spondylosis without myelopathy or radiculopathy, lumbar region: Secondary | ICD-10-CM

## 2016-09-25 DIAGNOSIS — K219 Gastro-esophageal reflux disease without esophagitis: Secondary | ICD-10-CM | POA: Diagnosis not present

## 2016-09-25 MED ORDER — CYCLOBENZAPRINE HCL 10 MG PO TABS: 10.0000 mg | ORAL_TABLET | Freq: Three times a day (TID) | ORAL | 0 refills | Status: DC | PRN

## 2016-09-25 MED ORDER — CYANOCOBALAMIN 2000 MCG PO TABS: 2000.0000 ug | ORAL_TABLET | Freq: Every day | ORAL | 0 refills | Status: DC

## 2016-09-25 MED ORDER — GABAPENTIN 800 MG PO TABS: 800.0000 mg | ORAL_TABLET | Freq: Three times a day (TID) | ORAL | 0 refills | Status: DC

## 2016-09-25 MED ORDER — TRAMADOL HCL 50 MG PO TABS: 50.0000 mg | ORAL_TABLET | Freq: Four times a day (QID) | ORAL | 0 refills | Status: DC | PRN

## 2016-09-25 MED ORDER — MELOXICAM 15 MG PO TABS: 15.0000 mg | ORAL_TABLET | Freq: Every day | ORAL | 0 refills | Status: DC

## 2016-09-25 MED ORDER — CYANOCOBALAMIN 1500 MCG PO TBDP: 1.0000 | ORAL_TABLET | Freq: Every day | ORAL | 0 refills | Status: DC

## 2016-09-25 NOTE — Patient Instructions (Addendum)
Rx for Tramadol 50 mg x 1 month given to patient.   Pain Score  Introduction: The pain score used by this practice is the Verbal Numerical Rating Scale (VNRS-11). This is an 11-point scale. It is for adults and children 10 years or older. There are significant differences in how the pain score is reported, used, and applied. Forget everything you learned in the past and learn this scoring system.  General Information: The scale should reflect your current level of pain. Unless you are specifically asked for the level of your worst pain, or your average pain. If you are asked for one of these two, then it should be understood that it is over the past 24 hours.  Basic Activities of Daily Living (ADL): Personal hygiene, dressing, eating, transferring, and using restroom.  Instructions: Most patients tend to report their level of pain as a combination of two factors, their physical pain and their psychosocial pain. This last one is also known as "suffering" and it is reflection of how physical pain affects you socially and psychologically. From now on, report them separately. From this point on, when asked to report your pain level, report only your physical pain. Use the following table for reference.  Pain Clinic Pain Levels (0-5/10)  Pain Level Score Description  No Pain 0   Mild pain 1 Nagging, annoying, but does not interfere with basic activities of daily living (ADL). Patients are able to eat, bathe, get dressed, toileting (being able to get on and off the toilet and perform personal hygiene functions), transfer (move in and out of bed or a chair without assistance), and maintain continence (able to control bladder and bowel functions). Blood pressure and heart rate are unaffected. A normal heart rate for a healthy adult ranges from 60 to 100 bpm (beats per minute).   Mild to moderate pain 2 Noticeable and distracting. Impossible to hide from other people. More frequent flare-ups. Still possible  to adapt and function close to normal. It can be very annoying and may have occasional stronger flare-ups. With discipline, patients may get used to it and adapt.   Moderate pain 3 Interferes significantly with activities of daily living (ADL). It becomes difficult to feed, bathe, get dressed, get on and off the toilet or to perform personal hygiene functions. Difficult to get in and out of bed or a chair without assistance. Very distracting. With effort, it can be ignored when deeply involved in activities.   Moderately severe pain 4 Impossible to ignore for more than a few minutes. With effort, patients may still be able to manage work or participate in some social activities. Very difficult to concentrate. Signs of autonomic nervous system discharge are evident: dilated pupils (mydriasis); mild sweating (diaphoresis); sleep interference. Heart rate becomes elevated (>115 bpm). Diastolic blood pressure (lower number) rises above 100 mmHg. Patients find relief in laying down and not moving.   Severe pain 5 Intense and extremely unpleasant. Associated with frowning face and frequent crying. Pain overwhelms the senses.  Ability to do any activity or maintain social relationships becomes significantly limited. Conversation becomes difficult. Pacing back and forth is common, as getting into a comfortable position is nearly impossible. Pain wakes you up from deep sleep. Physical signs will be obvious: pupillary dilation; increased sweating; goosebumps; brisk reflexes; cold, clammy hands and feet; nausea, vomiting or dry heaves; loss of appetite; significant sleep disturbance with inability to fall asleep or to remain asleep. When persistent, significant weight loss is observed due to  the complete loss of appetite and sleep deprivation.  Blood pressure and heart rate becomes significantly elevated. Caution: If elevated blood pressure triggers a pounding headache associated with blurred vision, then the patient  should immediately seek attention at an urgent or emergency care unit, as these may be signs of an impending stroke.    Emergency Department Pain Levels (6-10/10)  Emergency Room Pain 6 Severely limiting. Requires emergency care and should not be seen or managed at an outpatient pain management facility. Communication becomes difficult and requires great effort. Assistance to reach the emergency department may be required. Facial flushing and profuse sweating along with potentially dangerous increases in heart rate and blood pressure will be evident.   Distressing pain 7 Self-care is very difficult. Assistance is required to transport, or use restroom. Assistance to reach the emergency department will be required. Tasks requiring coordination, such as bathing and getting dressed become very difficult.   Disabling pain 8 Self-care is no longer possible. At this level, pain is disabling. The individual is unable to do even the most "basic" activities such as walking, eating, bathing, dressing, transferring to a bed, or toileting. Fine motor skills are lost. It is difficult to think clearly.   Incapacitating pain 9 Pain becomes incapacitating. Thought processing is no longer possible. Difficult to remember your own name. Control of movement and coordination are lost.   The worst pain imaginable 10 At this level, most patients pass out from pain. When this level is reached, collapse of the autonomic nervous system occurs, leading to a sudden drop in blood pressure and heart rate. This in turn results in a temporary and dramatic drop in blood flow to the brain, leading to a loss of consciousness. Fainting is one of the body's self defense mechanisms. Passing out puts the brain in a calmed state and causes it to shut down for a while, in order to begin the healing process.    Summary: 1. Refer to this scale when providing [place] with your pain level. 2. Be accurate and careful when reporting your pain level.  This will help with your care. 3. Over-reporting your pain level will lead to loss of credibility. 4. Even a level of 1/10 means that there is pain and will be treated at our facility. 5. High, inaccurate reporting will be documented as "Symptom Exaggeration", leading to loss of credibility and suspicions of possible secondary gains such as obtaining more narcotics, or wanting to appear disabled, for fraudulent reasons. 6. Only pain levels of 5 or below will be seen at our facility. 7. Pain levels of 6 and above will be sent to the Emergency Department and the appointment cancelled. _____________________________________________________________________________________________  Preparing for Procedure with Sedation Instructions: . Oral Intake: Do not eat or drink anything for at least 8 hours prior to your procedure. . Transportation: Public transportation is not allowed. Bring an adult driver. The driver must be physically present in our waiting room before any procedure can be started. Marland Kitchen Physical Assistance: Bring an adult physically capable of assisting you, in the event you need help. This adult should keep you company at home for at least 6 hours after the procedure. . Blood Pressure Medicine: Take your blood pressure medicine with a sip of water the morning of the procedure. . Blood thinners:  . Diabetics on insulin: Notify the staff so that you can be scheduled 1st case in the morning. If your diabetes requires high dose insulin, take only  of your normal insulin dose  the morning of the procedure and notify the staff that you have done so. . Preventing infections: Shower with an antibacterial soap the morning of your procedure. . Build-up your immune system: Take 1000 mg of Vitamin C with every meal (3 times a day) the day prior to your procedure. Marland Kitchen Antibiotics: Inform the staff if you have a condition or reason that requires you to take antibiotics before dental procedures. . Pregnancy: If  you are pregnant, call and cancel the procedure. . Sickness: If you have a cold, fever, or any active infections, call and cancel the procedure. . Arrival: You must be in the facility at least 30 minutes prior to your scheduled procedure. . Children: Do not bring children with you. . Dress appropriately: Bring dark clothing that you would not mind if they get stained. . Valuables: Do not bring any jewelry or valuables. Procedure appointments are reserved for interventional treatments only. Marland Kitchen No Prescription Refills. . No medication changes will be discussed during procedure appointments. . No disability issues will be discussed.  ____________________________________________________________________________________________  GENERAL RISKS AND COMPLICATIONS  What are the risk, side effects and possible complications? Generally speaking, most procedures are safe.  However, with any procedure there are risks, side effects, and the possibility of complications.  The risks and complications are dependent upon the sites that are lesioned, or the type of nerve block to be performed.  The closer the procedure is to the spine, the more serious the risks are.  Great care is taken when placing the radio frequency needles, block needles or lesioning probes, but sometimes complications can occur. 1. Infection: Any time there is an injection through the skin, there is a risk of infection.  This is why sterile conditions are used for these blocks.  There are four possible types of infection. 1. Localized skin infection. 2. Central Nervous System Infection-This can be in the form of Meningitis, which can be deadly. 3. Epidural Infections-This can be in the form of an epidural abscess, which can cause pressure inside of the spine, causing compression of the spinal cord with subsequent paralysis. This would require an emergency surgery to decompress, and there are no guarantees that the patient would recover from the  paralysis. 4. Discitis-This is an infection of the intervertebral discs.  It occurs in about 1% of discography procedures.  It is difficult to treat and it may lead to surgery.        2. Pain: the needles have to go through skin and soft tissues, will cause soreness.       3. Damage to internal structures:  The nerves to be lesioned may be near blood vessels or    other nerves which can be potentially damaged.       4. Bleeding: Bleeding is more common if the patient is taking blood thinners such as  aspirin, Coumadin, Ticiid, Plavix, etc., or if he/she have some genetic predisposition  such as hemophilia. Bleeding into the spinal canal can cause compression of the spinal  cord with subsequent paralysis.  This would require an emergency surgery to  decompress and there are no guarantees that the patient would recover from the  paralysis.       5. Pneumothorax:  Puncturing of a lung is a possibility, every time a needle is introduced in  the area of the chest or upper back.  Pneumothorax refers to free air around the  collapsed lung(s), inside of the thoracic cavity (chest cavity).  Another two possible  complications related to a similar event would include: Hemothorax and Chylothorax.   These are variations of the Pneumothorax, where instead of air around the collapsed  lung(s), you may have blood or chyle, respectively.       6. Spinal headaches: They may occur with any procedures in the area of the spine.       7. Persistent CSF (Cerebro-Spinal Fluid) leakage: This is a rare problem, but may occur  with prolonged intrathecal or epidural catheters either due to the formation of a fistulous  track or a dural tear.       8. Nerve damage: By working so close to the spinal cord, there is always a possibility of  nerve damage, which could be as serious as a permanent spinal cord injury with  paralysis.       9. Death:  Although rare, severe deadly allergic reactions known as "Anaphylactic  reaction" can  occur to any of the medications used.      10. Worsening of the symptoms:  We can always make thing worse.  What are the chances of something like this happening? Chances of any of this occuring are extremely low.  By statistics, you have more of a chance of getting killed in a motor vehicle accident: while driving to the hospital than any of the above occurring .  Nevertheless, you should be aware that they are possibilities.  In general, it is similar to taking a shower.  Everybody knows that you can slip, hit your head and get killed.  Does that mean that you should not shower again?  Nevertheless always keep in mind that statistics do not mean anything if you happen to be on the wrong side of them.  Even if a procedure has a 1 (one) in a 1,000,000 (million) chance of going wrong, it you happen to be that one..Also, keep in mind that by statistics, you have more of a chance of having something go wrong when taking medications.  Who should not have this procedure? If you are on a blood thinning medication (e.g. Coumadin, Plavix, see list of "Blood Thinners"), or if you have an active infection going on, you should not have the procedure.  If you are taking any blood thinners, please inform your physician.  How should I prepare for this procedure?  Do not eat or drink anything at least six hours prior to the procedure.  Bring a driver with you .  It cannot be a taxi.  Come accompanied by an adult that can drive you back, and that is strong enough to help you if your legs get weak or numb from the local anesthetic.  Take all of your medicines the morning of the procedure with just enough water to swallow them.  If you have diabetes, make sure that you are scheduled to have your procedure done first thing in the morning, whenever possible.  If you have diabetes, take only half of your insulin dose and notify our nurse that you have done so as soon as you arrive at the clinic.  If you are  diabetic, but only take blood sugar pills (oral hypoglycemic), then do not take them on the morning of your procedure.  You may take them after you have had the procedure.  Do not take aspirin or any aspirin-containing medications, at least eleven (11) days prior to the procedure.  They may prolong bleeding.  Wear loose fitting clothing that may be easy to take off and that  you would not mind if it got stained with Betadine or blood.  Do not wear any jewelry or perfume  Remove any nail coloring.  It will interfere with some of our monitoring equipment.  NOTE: Remember that this is not meant to be interpreted as a complete list of all possible complications.  Unforeseen problems may occur.  BLOOD THINNERS The following drugs contain aspirin or other products, which can cause increased bleeding during surgery and should not be taken for 2 weeks prior to and 1 week after surgery.  If you should need take something for relief of minor pain, you may take acetaminophen which is found in Tylenol,m Datril, Anacin-3 and Panadol. It is not blood thinner. The products listed below are.  Do not take any of the products listed below in addition to any listed on your instruction sheet.  A.P.C or A.P.C with Codeine Codeine Phosphate Capsules #3 Ibuprofen Ridaura  ABC compound Congesprin Imuran rimadil  Advil Cope Indocin Robaxisal  Alka-Seltzer Effervescent Pain Reliever and Antacid Coricidin or Coricidin-D  Indomethacin Rufen  Alka-Seltzer plus Cold Medicine Cosprin Ketoprofen S-A-C Tablets  Anacin Analgesic Tablets or Capsules Coumadin Korlgesic Salflex  Anacin Extra Strength Analgesic tablets or capsules CP-2 Tablets Lanoril Salicylate  Anaprox Cuprimine Capsules Levenox Salocol  Anexsia-D Dalteparin Magan Salsalate  Anodynos Darvon compound Magnesium Salicylate Sine-off  Ansaid Dasin Capsules Magsal Sodium Salicylate  Anturane Depen Capsules Marnal Soma  APF Arthritis pain formula Dewitt's Pills  Measurin Stanback  Argesic Dia-Gesic Meclofenamic Sulfinpyrazone  Arthritis Bayer Timed Release Aspirin Diclofenac Meclomen Sulindac  Arthritis pain formula Anacin Dicumarol Medipren Supac  Analgesic (Safety coated) Arthralgen Diffunasal Mefanamic Suprofen  Arthritis Strength Bufferin Dihydrocodeine Mepro Compound Suprol  Arthropan liquid Dopirydamole Methcarbomol with Aspirin Synalgos  ASA tablets/Enseals Disalcid Micrainin Tagament  Ascriptin Doan's Midol Talwin  Ascriptin A/D Dolene Mobidin Tanderil  Ascriptin Extra Strength Dolobid Moblgesic Ticlid  Ascriptin with Codeine Doloprin or Doloprin with Codeine Momentum Tolectin  Asperbuf Duoprin Mono-gesic Trendar  Aspergum Duradyne Motrin or Motrin IB Triminicin  Aspirin plain, buffered or enteric coated Durasal Myochrisine Trigesic  Aspirin Suppositories Easprin Nalfon Trillsate  Aspirin with Codeine Ecotrin Regular or Extra Strength Naprosyn Uracel  Atromid-S Efficin Naproxen Ursinus  Auranofin Capsules Elmiron Neocylate Vanquish  Axotal Emagrin Norgesic Verin  Azathioprine Empirin or Empirin with Codeine Normiflo Vitamin E  Azolid Emprazil Nuprin Voltaren  Bayer Aspirin plain, buffered or children's or timed BC Tablets or powders Encaprin Orgaran Warfarin Sodium  Buff-a-Comp Enoxaparin Orudis Zorpin  Buff-a-Comp with Codeine Equegesic Os-Cal-Gesic   Buffaprin Excedrin plain, buffered or Extra Strength Oxalid   Bufferin Arthritis Strength Feldene Oxphenbutazone   Bufferin plain or Extra Strength Feldene Capsules Oxycodone with Aspirin   Bufferin with Codeine Fenoprofen Fenoprofen Pabalate or Pabalate-SF   Buffets II Flogesic Panagesic   Buffinol plain or Extra Strength Florinal or Florinal with Codeine Panwarfarin   Buf-Tabs Flurbiprofen Penicillamine   Butalbital Compound Four-way cold tablets Penicillin   Butazolidin Fragmin Pepto-Bismol   Carbenicillin Geminisyn Percodan   Carna Arthritis Reliever Geopen Persantine    Carprofen Gold's salt Persistin   Chloramphenicol Goody's Phenylbutazone   Chloromycetin Haltrain Piroxlcam   Clmetidine heparin Plaquenil   Cllnoril Hyco-pap Ponstel   Clofibrate Hydroxy chloroquine Propoxyphen         Before stopping any of these medications, be sure to consult the physician who ordered them.  Some, such as Coumadin (Warfarin) are ordered to prevent or treat serious conditions such as "deep thrombosis", "pumonary embolisms", and other heart  problems.  The amount of time that you may need off of the medication may also vary with the medication and the reason for which you were taking it.  If you are taking any of these medications, please make sure you notify your pain physician before you undergo any procedures.         Facet Blocks Patient Information  Description: The facets are joints in the spine between the vertebrae.  Like any joints in the body, facets can become irritated and painful.  Arthritis can also effect the facets.  By injecting steroids and local anesthetic in and around these joints, we can temporarily block the nerve supply to them.  Steroids act directly on irritated nerves and tissues to reduce selling and inflammation which often leads to decreased pain.  Facet blocks may be done anywhere along the spine from the neck to the low back depending upon the location of your pain.   After numbing the skin with local anesthetic (like Novocaine), a small needle is passed onto the facet joints under x-ray guidance.  You may experience a sensation of pressure while this is being done.  The entire block usually lasts about 15-25 minutes.   Conditions which may be treated by facet blocks:   Low back/buttock pain  Neck/shoulder pain  Certain types of headaches  Preparation for the injection:  1. Do not eat any solid food or dairy products within 8 hours of your appointment. 2. You may drink clear liquid up to 3 hours before appointment.  Clear liquids  include water, black coffee, juice or soda.  No milk or cream please. 3. You may take your regular medication, including pain medications, with a sip of water before your appointment.  Diabetics should hold regular insulin (if taken separately) and take 1/2 normal NPH dose the morning of the procedure.  Carry some sugar containing items with you to your appointment. 4. A driver must accompany you and be prepared to drive you home after your procedure. 5. Bring all your current medications with you. 6. An IV may be inserted and sedation may be given at the discretion of the physician. 7. A blood pressure cuff, EKG and other monitors will often be applied during the procedure.  Some patients may need to have extra oxygen administered for a short period. 8. You will be asked to provide medical information, including your allergies and medications, prior to the procedure.  We must know immediately if you are taking blood thinners (like Coumadin/Warfarin) or if you are allergic to IV iodine contrast (dye).  We must know if you could possible be pregnant.  Possible side-effects:   Bleeding from needle site  Infection (rare, may require surgery)  Nerve injury (rare)  Numbness & tingling (temporary)  Difficulty urinating (rare, temporary)  Spinal headache (a headache worse with upright posture)  Light-headedness (temporary)  Pain at injection site (serveral days)  Decreased blood pressure (rare, temporary)  Weakness in arm/leg (temporary)  Pressure sensation in back/neck (temporary)   Call if you experience:   Fever/chills associated with headache or increased back/neck pain  Headache worsened by an upright position  New onset, weakness or numbness of an extremity below the injection site  Hives or difficulty breathing (go to the emergency room)  Inflammation or drainage at the injection site(s)  Severe back/neck pain greater than usual  New symptoms which are concerning to  you  Please note:  Although the local anesthetic injected can often make your back or  neck feel good for several hours after the injection, the pain will likely return. It takes 3-7 days for steroids to work.  You may not notice any pain relief for at least one week.  If effective, we will often do a series of 2-3 injections spaced 3-6 weeks apart to maximally decrease your pain.  After the initial series, you may be a candidate for a more permanent nerve block of the facets.  If you have any questions, please call #[phone]) [facility]

## 2016-09-25 NOTE — Progress Notes (Signed)
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 is grossly maintained. Pubic symphysis appears intact.  IMPRESSION: No acute osseous abnormality.   Electronically Signed   By: Donavan Foil M.D.   On: 08/07/2016 15:31    Hip-L DG 2-3 views:  Results for orders placed during the hospital encounter of 08/07/16  DG HIP UNILAT W OR W/O PELVIS 2-3 VIEWS LEFT   Narrative CLINICAL DATA:  Hip pain  EXAM: DG HIP (WITH OR WITHOUT PELVIS) 2-3V LEFT  COMPARISON:  CT 04/11/2016  FINDINGS: Left femoral head projects in joint. No fracture or dislocation. Joint space appears maintained. Pubic symphysis appears intact.  IMPRESSION: No acute osseous abnormality   Electronically Signed   By: Donavan Foil M.D.   On: 08/07/2016 15:30    Note: Results of ordered imaging test(s) reviewed and explained to patient in Layman's terms. Copy of results provided to patient  Meds  The  patient has a current medication list which includes the following prescription(s): albuterol, bupropion, cyanocobalamin, cyanocobalamin, cyclobenzaprine, diltiazem, duloxetine, enalapril, finasteride, fluticasone, fluticasone-salmeterol, furosemide, gabapentin, ipratropium, ipratropium-albuterol, lidocaine, meloxicam, metoprolol tartrate, nicotine, omeprazole, polyethylene glycol, potassium chloride sa, prednisone, simvastatin, tamsulosin, tiotropium, tramadol, trazodone, and zinc oxide.  Current Outpatient Prescriptions on File Prior to Visit  Medication Sig  . albuterol (PROVENTIL HFA;VENTOLIN HFA) 108 (90 Base) MCG/ACT inhaler Inhale 2 puffs into the lungs every 4 (four) hours as needed for wheezing or shortness of breath. Reported on 10/26/2015  . buPROPion (WELLBUTRIN) 75 MG tablet Take 75 mg by mouth 2 (two) times daily. Reported on 10/26/2015  . DULoxetine (CYMBALTA) 60 MG capsule Take 60 mg by mouth daily. Reported on 10/26/2015  . enalapril (VASOTEC) 5 MG tablet Take 5 mg by mouth daily.  . finasteride (PROSCAR) 5 MG tablet Take 5 mg by mouth daily.  . fluticasone (FLONASE) 50 MCG/ACT nasal spray SHAKE LIQUID AND USE 2 SPRAYS IN EACH NOSTRIL DAILY  . furosemide (LASIX) 20 MG tablet Take 1 tablet (20 mg total) by mouth daily. For 1 week and then when necessary as needed for swelling and edema  . ipratropium (ATROVENT HFA) 17 MCG/ACT inhaler Inhale 2 puffs into the lungs every 6 (six) hours.  Marland Kitchen ipratropium-albuterol (DUONEB) 0.5-2.5 (3) MG/3ML SOLN Take 3 mLs by nebulization every 6 (six) hours as needed.  . lidocaine (XYLOCAINE) 5 % ointment Apply 1 application topically See admin instructions. 2 to 4 times daily  . metoprolol tartrate (LOPRESSOR) 25 MG tablet Take 1 tablet (25 mg total) by mouth 2 (two) times daily.  . nicotine (NICODERM CQ - DOSED IN  MG/24 HOURS) 21 mg/24hr patch Place 21 mg onto the skin daily.   Marland Kitchen omeprazole (PRILOSEC) 40 MG capsule Take 40 mg by mouth 2 (two) times daily.   . polyethylene glycol (MIRALAX / GLYCOLAX) packet Take 17 g by mouth daily.  . potassium chloride SA (K-DUR,KLOR-CON) 20 MEQ tablet Take 1 tablet (20 mEq total) by mouth daily. When ever the furosemide is taken  . predniSONE (DELTASONE) 10 MG tablet   . simvastatin (ZOCOR) 20 MG tablet Take 20 mg by mouth at bedtime.   . tamsulosin (FLOMAX) 0.4 MG CAPS capsule Take 0.4 mg by mouth daily.  . traZODone (DESYREL) 50 MG tablet Take 150 mg by mouth at bedtime.   No current facility-administered medications on file prior to visit.    ROS  Constitutional: Denies any fever or chills Gastrointestinal: No reported hemesis, hematochezia, vomiting, or acute GI distress Musculoskeletal: Denies any acute onset joint swelling, redness, loss of ROM, or weakness Neurological: No reported episodes of acute onset apraxia, aphasia, dysarthria, agnosia, amnesia, paralysis, loss of coordination, or loss of consciousness  Allergies  Mr. Shibata has No Known Allergies.  [place]  Drug: Mr. Moskal  reports that he uses drugs. Alcohol:  reports that he does not drink alcohol. Tobacco:  reports that he has quit smoking. His smoking use included Cigarettes. He smoked 0.50 packs per day. He uses smokeless tobacco. Medical:  has a past medical history of Anxiety; Chronic back pain; Chronic neck pain; COPD (chronic obstructive pulmonary disease) ([place]); Depression; Hyperlipidemia; Hypertension; and Opiate abuse, continuous. Family: family history includes Bladder Cancer in his father.  Past Surgical History:  Procedure Laterality Date  . CHOLECYSTECTOMY    . NECK SURGERY  2002 approx   fusion, about 12 years ago  . SPINAL FUSION     ?? C3- C4   Constitutional Exam  General appearance: Well nourished, well developed, and well hydrated. In no apparent acute distress Vitals:   09/25/16 1041  BP: 131/78  Pulse: (!) 119  Resp: 16  Temp: 97.9 F (36.6 C)  TempSrc: Oral  SpO2: 95%  Weight: 187 lb (84.8 kg)   Height: 5' 7" (1.702 m)   BMI Assessment: Estimated body mass index is 29.29 kg/m as calculated from the following:   Height as of this encounter: 5' 7" (1.702 m).   Weight as of this encounter: 187 lb (84.8 kg).  BMI interpretation table: BMI level Category Range association with higher incidence of chronic pain  <18 kg/m2 Underweight   18.5-24.9 kg/m2 Ideal body weight   25-29.9 kg/m2 Overweight Increased incidence by 20%  30-34.9 kg/m2 Obese (Class I) Increased incidence by 68%  35-39.9 kg/m2 Severe obesity (Class II) Increased incidence by 136%  >40 kg/m2 Extreme obesity (Class III) Increased incidence by 254%   BMI Readings from Last 4 Encounters:  09/25/16 29.29 kg/m  08/05/16 22.87 kg/m  04/21/16 25.94 kg/m  04/06/16 26.00 kg/m   Wt Readings from Last 4 Encounters:  09/25/16 187 lb (84.8 kg)  08/05/16 164 lb (74.4 kg)  04/21/16 186 lb (84.4 kg)  04/06/16 186 lb 6.4 oz (84.6 kg)  Psych/Mental status: Alert, oriented x 3 (person, place, & time)       Eyes: PERLA Respiratory: No evidence of acute respiratory distress  Cervical Spine Exam  Inspection: No masses, redness, or swelling Alignment: Symmetrical Functional ROM: Unrestricted ROM Stability: No instability detected Muscle strength & Tone: Functionally intact Sensory: Unimpaired Palpation: Non-contributory  Upper Extremity (UE) Exam    Side:  Right upper extremity  Side: Left upper extremity  Inspection: No masses, redness, swelling, or asymmetry. No contractures  Inspection: No masses, redness, swelling, or asymmetry. No contractures  Functional ROM: Unrestricted ROM          Functional ROM: Unrestricted ROM          Muscle strength & Tone: Functionally intact  Muscle strength & Tone: Functionally intact  Sensory: Unimpaired  Sensory: Unimpaired  Palpation: Euthermic  Palpation: Euthermic  Specialized Test(s): Deferred         Specialized Test(s): Deferred          Thoracic Spine Exam  Inspection: No  masses, redness, or swelling Alignment: Symmetrical Functional ROM: Unrestricted ROM Stability: No instability detected Sensory: Unimpaired Muscle strength & Tone: Functionally intact Palpation: Non-contributory  Lumbar Spine Exam  Inspection: No masses, redness, or swelling Alignment: Symmetrical Functional ROM: Limited ROM Stability: No instability detected Muscle strength & Tone: Functionally intact Sensory: Movement-associated pain Palpation: Complains of area being tender to palpation Provocative Tests: Lumbar Hyperextension and rotation test: Positive bilaterally for facet joint pain. Patrick's Maneuver: Positive for bilateral S-I joint pain           Gait & Posture Assessment  Ambulation: Unassisted Gait: Relatively normal for age and body habitus Posture: WNL   Lower Extremity Exam    Side: Right lower extremity  Side: Left lower extremity  Inspection: No masses, redness, swelling, or asymmetry. No contractures  Inspection: No masses, redness, swelling, or asymmetry. No contractures  Functional ROM: Unrestricted ROM          Functional ROM: Unrestricted ROM          Muscle strength & Tone: Functionally intact  Muscle strength & Tone: Functionally intact  Sensory: Unimpaired  Sensory: Unimpaired  Palpation: No palpable anomalies  Palpation: No palpable anomalies   Assessment & Plan  Primary Diagnosis & Pertinent Problem List: The primary encounter diagnosis was Chronic pain syndrome. Diagnoses of B12 deficiency, Chronic low back pain (Location of Primary Source of Pain) (Bilateral) (R>L), Chronic neck pain (Location of Secondary source of pain) (Bilateral) (R>L), Chronic shoulder pain (Location of Tertiary source of pain) (Bilateral) (R>L), Facet syndrome, lumbar, Osteoarthritis, Neurogenic pain, Musculoskeletal pain, Sacroiliac joint dysfunction, and Cervical post-laminectomy syndrome were also pertinent to this visit.  Visit Diagnosis: 1. Chronic pain syndrome   2. B12  deficiency   3. Chronic low back pain (Location of Primary Source of Pain) (Bilateral) (R>L)   4. Chronic neck pain (Location of Secondary source of pain) (Bilateral) (R>L)   5. Chronic shoulder pain (Location of Tertiary source of pain) (Bilateral) (R>L)   6. Facet syndrome, lumbar   7. Osteoarthritis   8. Neurogenic pain   9. Musculoskeletal pain   10. Sacroiliac joint dysfunction   11. Cervical post-laminectomy syndrome    Problems updated and reviewed during this visit: Problem  Osteoarthritis  Neurogenic Pain  Musculoskeletal Pain  B12 Deficiency  Diaphragmatic Hernia   Overview:  61 yo male referred by Dr. Marygrace Drought.  He has a history of  bilateral Bochdalek hernias and chronic rib fx L10, s/p L hernia mesh repair with rib plating 05/08/16 with Dr. Waldemar Dickens. He presented to the ED 07/03/16 w/SOB after feeling a "pop" in his L abdomen. CT showed a small fluid collection along the area. On return to Dr. Justice Deeds clinic he has a small bulge in this area that is tender, but he is otherwise asymptomatic and has no evidence of infection. He developed shortness  of breath and LUQ pain 12/6 and presented to the ED 12/7 for further evaluation. His SOB resolved spontaneously but he was incidentally found on CT to have a fluid collection near the rib plating site.   Quit smoking 03/2016.  Chronic pain requiring opiates; now has pain management specialist - note in [place]  07/03/2016 CTA chest: -No evidence of pulmonary embolism.  -Sequela of left diaphragmatic hernia repair and left ninth rib screw/plate fixation with residual trace loculated left pleural effusion. Large right Bochdalek hernia unchanged.  -Emphysema.  06/24/2016 CXR: Conclusions Lung hyperinflation similar to prior Mild decrease in left basilar opacity likely atelectasis No new enlarging air space disease Small left effusion with partial loculation laterally, slightly decreased No  pneumothoraces Cardiomediastinal silhouette unchanged Partially imaged ACDF and posterolateral left 9th rib screw/plate fixation unchanged    Problem-specific Plan(s): No problem-specific Assessment & Plan notes found for this encounter.  Assessment & plan notes cannot be loaded without a specified hospital service.  Plan of Care  Pharmacotherapy (Medications Ordered): Meds ordered this encounter  Medications  . cyanocobalamin ([employer] VITAMIN B12) 2000 MCG tablet    Sig: Take 1 tablet (2,000 mcg total) by mouth daily.    Dispense:  180 tablet    Refill:  0    Do not add to the "Automatic Refill" notification system.  . Cyanocobalamin (VITAMELTS ENERGY VITAMIN B-12) 1500 MCG TBDP    Sig: Take 1 tablet by mouth daily.    Dispense:  180 tablet    Refill:  0    Do not add to the "Automatic Refill" notification system.  . traMADol (ULTRAM) 50 MG tablet    Sig: Take 1 tablet (50 mg total) by mouth every 6 (six) hours as needed.    Dispense:  120 tablet    Refill:  0    Do not place medication on "Automatic Refill". Fill one day early if pharmacy is closed on scheduled refill date.  . meloxicam (MOBIC) 15 MG tablet    Sig: Take 1 tablet (15 mg total) by mouth daily.    Dispense:  30 tablet    Refill:  0    Do not add this medication to the electronic "Automatic Refill" notification system. Patient may have prescription filled one day early if pharmacy is closed on scheduled refill date.  . cyclobenzaprine (FLEXERIL) 10 MG tablet    Sig: Take 1 tablet (10 mg total) by mouth 3 (three) times daily as needed for muscle spasms.    Dispense:  90 tablet    Refill:  0    Do not place medication on "Automatic Refill". Fill one day early if pharmacy is closed on scheduled refill date.  . gabapentin (NEURONTIN) 800 MG tablet    Sig: Take 1 tablet (800 mg total) by mouth every 8 (eight) hours.    Dispense:  90 tablet    Refill:  0    Do not place this medication, or any other prescription from  our practice, on "Automatic Refill". Patient may have prescription filled one day early if pharmacy is closed on scheduled refill date.   Lab-work, procedure(s), and/or referral(s): Orders Placed This Encounter  Procedures  . LUMBAR FACET(MEDIAL BRANCH NERVE BLOCK) MBNB  . SACROILIAC JOINT INJECTINS    Pharmacotherapy: Opioid Analgesics: We'll take over management today. See above orders Membrane stabilizer: We have discussed the possibility of optimizing this mode of therapy, if tolerated Muscle relaxant: We have discussed the possibility of a trial NSAID: We have discussed  the possibility of a trial Other analgesic(s): To be determined at a later time   Interventional therapies: Planned, scheduled, and/or pending:    Diagnostic bilateral lumbar facet block + bilateral SI block   Considering:   Diagnostic bilateral lumbar facet block  Diagnostic right-sided lumbar epidural steroid injection  Diagnostic bilateral cervical facet block  Diagnostic right-sided cervical epidural steroid injection  Diagnostic bilateral intra-articular shoulder joint injections  Diagnostic bilateral suprascapular nerve blocks    PRN Procedures:   To be determined at a later time   Provider-requested follow-up: Return in about 1 month (around 10/26/2016) for (MD) Med-Mgmt, in addition, procedure (ASAP).  Future Appointments Date Time Provider [place]  10/06/2016 2:00 PM Alfonzo Feller, RN [facility] None  10/09/2016 9:30 AM Milinda Pointer, MD [facility] None    Primary Care Physician: Marygrace Drought, MD Location: [facility] Facility Note by: Kathlen Brunswick. Dossie Arbour, M.D, DABA, DABAPM, DABPM, DABIPP, FIPP Date: 09/25/2016; Time: 12:56 PM  Pain Score Disclaimer: We use the NRS-11 scale. This is a self-reported, subjective measurement of pain severity with only modest accuracy. It is used primarily to identify changes within a particular patient. It must be understood that  outpatient pain scales are significantly less accurate that those used for research, where they can be applied under ideal controlled circumstances with minimal exposure to variables. In reality, the score is likely to be a combination of pain intensity and pain affect, where pain affect describes the degree of emotional arousal or changes in action readiness caused by the sensory experience of pain. Factors such as social and work situation, setting, emotional state, anxiety levels, expectation, and prior pain experience may influence pain perception and show large inter-individual differences that may also be affected by time variables.  Patient instructions provided during this appointment: Patient Instructions   Rx for Tramadol 50 mg x 1 month given to patient.   Pain Score  Introduction: The pain score used by this practice is the Verbal Numerical Rating Scale (VNRS-11). This is an 11-point scale. It is for adults and children 10 years or older. There are significant differences in how the pain score is reported, used, and applied. Forget everything you learned in the past and learn this scoring system.  General Information: The scale should reflect your current level of pain. Unless you are specifically asked for the level of your worst pain, or your average pain. If you are asked for one of these two, then it should be understood that it is over the past 24 hours.  Basic Activities of Daily Living (ADL): Personal hygiene, dressing, eating, transferring, and using restroom.  Instructions: Most patients tend to report their level of pain as a combination of two factors, their physical pain and their psychosocial pain. This last one is also known as "suffering" and it is reflection of how physical pain affects you socially and psychologically. From now on, report them separately. From this point on, when asked to report your pain level, report only your physical pain. Use the following table for  reference.  Pain Clinic Pain Levels (0-5/10)  Pain Level Score Description  No Pain 0   Mild pain 1 Nagging, annoying, but does not interfere with basic activities of daily living (ADL). Patients are able to eat, bathe, get dressed, toileting (being able to get on and off the toilet and perform personal hygiene functions), transfer (move in and out of bed or a chair without assistance), and maintain continence (able to control bladder  and bowel functions). Blood pressure and heart rate are unaffected. A normal heart rate for a healthy adult ranges from 60 to 100 bpm (beats per minute).   Mild to moderate pain 2 Noticeable and distracting. Impossible to hide from other people. More frequent flare-ups. Still possible to adapt and function close to normal. It can be very annoying and may have occasional stronger flare-ups. With discipline, patients may get used to it and adapt.   Moderate pain 3 Interferes significantly with activities of daily living (ADL). It becomes difficult to feed, bathe, get dressed, get on and off the toilet or to perform personal hygiene functions. Difficult to get in and out of bed or a chair without assistance. Very distracting. With effort, it can be ignored when deeply involved in activities.   Moderately severe pain 4 Impossible to ignore for more than a few minutes. With effort, patients may still be able to manage work or participate in some social activities. Very difficult to concentrate. Signs of autonomic nervous system discharge are evident: dilated pupils (mydriasis); mild sweating (diaphoresis); sleep interference. Heart rate becomes elevated (>115 bpm). Diastolic blood pressure (lower number) rises above 100 mmHg. Patients find relief in laying down and not moving.   Severe pain 5 Intense and extremely unpleasant. Associated with frowning face and frequent crying. Pain overwhelms the senses.  Ability to do any activity or maintain social relationships becomes  significantly limited. Conversation becomes difficult. Pacing back and forth is common, as getting into a comfortable position is nearly impossible. Pain wakes you up from deep sleep. Physical signs will be obvious: pupillary dilation; increased sweating; goosebumps; brisk reflexes; cold, clammy hands and feet; nausea, vomiting or dry heaves; loss of appetite; significant sleep disturbance with inability to fall asleep or to remain asleep. When persistent, significant weight loss is observed due to the complete loss of appetite and sleep deprivation.  Blood pressure and heart rate becomes significantly elevated. Caution: If elevated blood pressure triggers a pounding headache associated with blurred vision, then the patient should immediately seek attention at an urgent or emergency care unit, as these may be signs of an impending stroke.    Emergency Department Pain Levels (6-10/10)  Emergency Room Pain 6 Severely limiting. Requires emergency care and should not be seen or managed at an outpatient pain management facility. Communication becomes difficult and requires great effort. Assistance to reach the emergency department may be required. Facial flushing and profuse sweating along with potentially dangerous increases in heart rate and blood pressure will be evident.   Distressing pain 7 Self-care is very difficult. Assistance is required to transport, or use restroom. Assistance to reach the emergency department will be required. Tasks requiring coordination, such as bathing and getting dressed become very difficult.   Disabling pain 8 Self-care is no longer possible. At this level, pain is disabling. The individual is unable to do even the most "basic" activities such as walking, eating, bathing, dressing, transferring to a bed, or toileting. Fine motor skills are lost. It is difficult to think clearly.   Incapacitating pain 9 Pain becomes incapacitating. Thought processing is no longer possible.  Difficult to remember your own name. Control of movement and coordination are lost.   The worst pain imaginable 10 At this level, most patients pass out from pain. When this level is reached, collapse of the autonomic nervous system occurs, leading to a sudden drop in blood pressure and heart rate. This in turn results in a temporary and dramatic drop in  blood flow to the brain, leading to a loss of consciousness. Fainting is one of the body's self defense mechanisms. Passing out puts the brain in a calmed state and causes it to shut down for a while, in order to begin the healing process.    Summary: 1. Refer to this scale when providing [place] with your pain level. 2. Be accurate and careful when reporting your pain level. This will help with your care. 3. Over-reporting your pain level will lead to loss of credibility. 4. Even a level of 1/10 means that there is pain and will be treated at our facility. 5. High, inaccurate reporting will be documented as "Symptom Exaggeration", leading to loss of credibility and suspicions of possible secondary gains such as obtaining more narcotics, or wanting to appear disabled, for fraudulent reasons. 6. Only pain levels of 5 or below will be seen at our facility. 7. Pain levels of 6 and above will be sent to the Emergency Department and the appointment cancelled. _____________________________________________________________________________________________  Preparing for Procedure with Sedation Instructions: . Oral Intake: Do not eat or drink anything for at least 8 hours prior to your procedure. . Transportation: Public transportation is not allowed. Bring an adult driver. The driver must be physically present in our waiting room before any procedure can be started. Marland Kitchen Physical Assistance: Bring an adult physically capable of assisting you, in the event you need help. This adult should keep you company at home for at least 6 hours after the procedure. . Blood  Pressure Medicine: Take your blood pressure medicine with a sip of water the morning of the procedure. . Blood thinners:  . Diabetics on insulin: Notify the staff so that you can be scheduled 1st case in the morning. If your diabetes requires high dose insulin, take only  of your normal insulin dose the morning of the procedure and notify the staff that you have done so. . Preventing infections: Shower with an antibacterial soap the morning of your procedure. . Build-up your immune system: Take 1000 mg of Vitamin C with every meal (3 times a day) the day prior to your procedure. Marland Kitchen Antibiotics: Inform the staff if you have a condition or reason that requires you to take antibiotics before dental procedures. . Pregnancy: If you are pregnant, call and cancel the procedure. . Sickness: If you have a cold, fever, or any active infections, call and cancel the procedure. . Arrival: You must be in the facility at least 30 minutes prior to your scheduled procedure. . Children: Do not bring children with you. . Dress appropriately: Bring dark clothing that you would not mind if they get stained. . Valuables: Do not bring any jewelry or valuables. Procedure appointments are reserved for interventional treatments only. Marland Kitchen No Prescription Refills. . No medication changes will be discussed during procedure appointments. . No disability issues will be discussed.  ____________________________________________________________________________________________  GENERAL RISKS AND COMPLICATIONS  What are the risk, side effects and possible complications? Generally speaking, most procedures are safe.  However, with any procedure there are risks, side effects, and the possibility of complications.  The risks and complications are dependent upon the sites that are lesioned, or the type of nerve block to be performed.  The closer the procedure is to the spine, the more serious the risks are.  Great care is taken when  placing the radio frequency needles, block needles or lesioning probes, but sometimes complications can occur. 1. Infection: Any time there is an injection through the  skin, there is a risk of infection.  This is why sterile conditions are used for these blocks.  There are four possible types of infection. 1. Localized skin infection. 2. Central Nervous System Infection-This can be in the form of Meningitis, which can be deadly. 3. Epidural Infections-This can be in the form of an epidural abscess, which can cause pressure inside of the spine, causing compression of the spinal cord with subsequent paralysis. This would require an emergency surgery to decompress, and there are no guarantees that the patient would recover from the paralysis. 4. Discitis-This is an infection of the intervertebral discs.  It occurs in about 1% of discography procedures.  It is difficult to treat and it may lead to surgery.        2. Pain: the needles have to go through skin and soft tissues, will cause soreness.       3. Damage to internal structures:  The nerves to be lesioned may be near blood vessels or    other nerves which can be potentially damaged.       4. Bleeding: Bleeding is more common if the patient is taking blood thinners such as  aspirin, Coumadin, Ticiid, Plavix, etc., or if he/she have some genetic predisposition  such as hemophilia. Bleeding into the spinal canal can cause compression of the spinal  cord with subsequent paralysis.  This would require an emergency surgery to  decompress and there are no guarantees that the patient would recover from the  paralysis.       5. Pneumothorax:  Puncturing of a lung is a possibility, every time a needle is introduced in  the area of the chest or upper back.  Pneumothorax refers to free air around the  collapsed lung(s), inside of the thoracic cavity (chest cavity).  Another two possible  complications related to a similar event would include: Hemothorax and  Chylothorax.   These are variations of the Pneumothorax, where instead of air around the collapsed  lung(s), you may have blood or chyle, respectively.       6. Spinal headaches: They may occur with any procedures in the area of the spine.       7. Persistent CSF (Cerebro-Spinal Fluid) leakage: This is a rare problem, but may occur  with prolonged intrathecal or epidural catheters either due to the formation of a fistulous  track or a dural tear.       8. Nerve damage: By working so close to the spinal cord, there is always a possibility of  nerve damage, which could be as serious as a permanent spinal cord injury with  paralysis.       9. Death:  Although rare, severe deadly allergic reactions known as "Anaphylactic  reaction" can occur to any of the medications used.      10. Worsening of the symptoms:  We can always make thing worse.  What are the chances of something like this happening? Chances of any of this occuring are extremely low.  By statistics, you have more of a chance of getting killed in a motor vehicle accident: while driving to the hospital than any of the above occurring .  Nevertheless, you should be aware that they are possibilities.  In general, it is similar to taking a shower.  Everybody knows that you can slip, hit your head and get killed.  Does that mean that you should not shower again?  Nevertheless always keep in mind that statistics do not mean anything  if you happen to be on the wrong side of them.  Even if a procedure has a 1 (one) in a 1,000,000 (million) chance of going wrong, it you happen to be that one..Also, keep in mind that by statistics, you have more of a chance of having something go wrong when taking medications.  Who should not have this procedure? If you are on a blood thinning medication (e.g. Coumadin, Plavix, see list of "Blood Thinners"), or if you have an active infection going on, you should not have the procedure.  If you are taking any blood thinners,  please inform your physician.  How should I prepare for this procedure?  Do not eat or drink anything at least six hours prior to the procedure.  Bring a driver with you .  It cannot be a taxi.  Come accompanied by an adult that can drive you back, and that is strong enough to help you if your legs get weak or numb from the local anesthetic.  Take all of your medicines the morning of the procedure with just enough water to swallow them.  If you have diabetes, make sure that you are scheduled to have your procedure done first thing in the morning, whenever possible.  If you have diabetes, take only half of your insulin dose and notify our nurse that you have done so as soon as you arrive at the clinic.  If you are diabetic, but only take blood sugar pills (oral hypoglycemic), then do not take them on the morning of your procedure.  You may take them after you have had the procedure.  Do not take aspirin or any aspirin-containing medications, at least eleven (11) days prior to the procedure.  They may prolong bleeding.  Wear loose fitting clothing that may be easy to take off and that you would not mind if it got stained with Betadine or blood.  Do not wear any jewelry or perfume  Remove any nail coloring.  It will interfere with some of our monitoring equipment.  NOTE: Remember that this is not meant to be interpreted as a complete list of all possible complications.  Unforeseen problems may occur.  BLOOD THINNERS The following drugs contain aspirin or other products, which can cause increased bleeding during surgery and should not be taken for 2 weeks prior to and 1 week after surgery.  If you should need take something for relief of minor pain, you may take acetaminophen which is found in Tylenol,m Datril, Anacin-3 and Panadol. It is not blood thinner. The products listed below are.  Do not take any of the products listed below in addition to any listed on your instruction  sheet.  A.P.C or A.P.C with Codeine Codeine Phosphate Capsules #3 Ibuprofen Ridaura  ABC compound Congesprin Imuran rimadil  Advil Cope Indocin Robaxisal  Alka-Seltzer Effervescent Pain Reliever and Antacid Coricidin or Coricidin-D  Indomethacin Rufen  Alka-Seltzer plus Cold Medicine Cosprin Ketoprofen S-A-C Tablets  Anacin Analgesic Tablets or Capsules Coumadin Korlgesic Salflex  Anacin Extra Strength Analgesic tablets or capsules CP-2 Tablets Lanoril Salicylate  Anaprox Cuprimine Capsules Levenox Salocol  Anexsia-D Dalteparin Magan Salsalate  Anodynos Darvon compound Magnesium Salicylate Sine-off  Ansaid Dasin Capsules Magsal Sodium Salicylate  Anturane Depen Capsules Marnal Soma  APF Arthritis pain formula Dewitt's Pills Measurin Stanback  Argesic Dia-Gesic Meclofenamic Sulfinpyrazone  Arthritis Bayer Timed Release Aspirin Diclofenac Meclomen Sulindac  Arthritis pain formula Anacin Dicumarol Medipren Supac  Analgesic (Safety coated) Arthralgen Diffunasal Mefanamic Suprofen  Arthritis Strength  Bufferin Dihydrocodeine Mepro Compound Suprol  Arthropan liquid Dopirydamole Methcarbomol with Aspirin Synalgos  ASA tablets/Enseals Disalcid Micrainin Tagament  Ascriptin Doan's Midol Talwin  Ascriptin A/D Dolene Mobidin Tanderil  Ascriptin Extra Strength Dolobid Moblgesic Ticlid  Ascriptin with Codeine Doloprin or Doloprin with Codeine Momentum Tolectin  Asperbuf Duoprin Mono-gesic Trendar  Aspergum Duradyne Motrin or Motrin IB Triminicin  Aspirin plain, buffered or enteric coated Durasal Myochrisine Trigesic  Aspirin Suppositories Easprin Nalfon Trillsate  Aspirin with Codeine Ecotrin Regular or Extra Strength Naprosyn Uracel  Atromid-S Efficin Naproxen Ursinus  Auranofin Capsules Elmiron Neocylate Vanquish  Axotal Emagrin Norgesic Verin  Azathioprine Empirin or Empirin with Codeine Normiflo Vitamin E  Azolid Emprazil Nuprin Voltaren  Bayer Aspirin plain, buffered or children's or  timed BC Tablets or powders Encaprin Orgaran Warfarin Sodium  Buff-a-Comp Enoxaparin Orudis Zorpin  Buff-a-Comp with Codeine Equegesic Os-Cal-Gesic   Buffaprin Excedrin plain, buffered or Extra Strength Oxalid   Bufferin Arthritis Strength Feldene Oxphenbutazone   Bufferin plain or Extra Strength Feldene Capsules Oxycodone with Aspirin   Bufferin with Codeine Fenoprofen Fenoprofen Pabalate or Pabalate-SF   Buffets II Flogesic Panagesic   Buffinol plain or Extra Strength Florinal or Florinal with Codeine Panwarfarin   Buf-Tabs Flurbiprofen Penicillamine   Butalbital Compound Four-way cold tablets Penicillin   Butazolidin Fragmin Pepto-Bismol   Carbenicillin Geminisyn Percodan   Carna Arthritis Reliever Geopen Persantine   Carprofen Gold's salt Persistin   Chloramphenicol Goody's Phenylbutazone   Chloromycetin Haltrain Piroxlcam   Clmetidine heparin Plaquenil   Cllnoril Hyco-pap Ponstel   Clofibrate Hydroxy chloroquine Propoxyphen         Before stopping any of these medications, be sure to consult the physician who ordered them.  Some, such as Coumadin (Warfarin) are ordered to prevent or treat serious conditions such as "deep thrombosis", "pumonary embolisms", and other heart problems.  The amount of time that you may need off of the medication may also vary with the medication and the reason for which you were taking it.  If you are taking any of these medications, please make sure you notify your pain physician before you undergo any procedures.         Facet Blocks Patient Information  Description: The facets are joints in the spine between the vertebrae.  Like any joints in the body, facets can become irritated and painful.  Arthritis can also effect the facets.  By injecting steroids and local anesthetic in and around these joints, we can temporarily block the nerve supply to them.  Steroids act directly on irritated nerves and tissues to reduce selling and inflammation which  often leads to decreased pain.  Facet blocks may be done anywhere along the spine from the neck to the low back depending upon the location of your pain.   After numbing the skin with local anesthetic (like Novocaine), a small needle is passed onto the facet joints under x-ray guidance.  You may experience a sensation of pressure while this is being done.  The entire block usually lasts about 15-25 minutes.   Conditions which may be treated by facet blocks:   Low back/buttock pain  Neck/shoulder pain  Certain types of headaches  Preparation for the injection:  1. Do not eat any solid food or dairy products within 8 hours of your appointment. 2. You may drink clear liquid up to 3 hours before appointment.  Clear liquids include water, black coffee, juice or soda.  No milk or cream please. 3. You may take your regular medication,  including pain medications, with a sip of water before your appointment.  Diabetics should hold regular insulin (if taken separately) and take 1/2 normal NPH dose the morning of the procedure.  Carry some sugar containing items with you to your appointment. 4. A driver must accompany you and be prepared to drive you home after your procedure. 5. Bring all your current medications with you. 6. An IV may be inserted and sedation may be given at the discretion of the physician. 7. A blood pressure cuff, EKG and other monitors will often be applied during the procedure.  Some patients may need to have extra oxygen administered for a short period. 8. You will be asked to provide medical information, including your allergies and medications, prior to the procedure.  We must know immediately if you are taking blood thinners (like Coumadin/Warfarin) or if you are allergic to IV iodine contrast (dye).  We must know if you could possible be pregnant.  Possible side-effects:   Bleeding from needle site  Infection (rare, may require surgery)  Nerve injury (rare)  Numbness  & tingling (temporary)  Difficulty urinating (rare, temporary)  Spinal headache (a headache worse with upright posture)  Light-headedness (temporary)  Pain at injection site (serveral days)  Decreased blood pressure (rare, temporary)  Weakness in arm/leg (temporary)  Pressure sensation in back/neck (temporary)   Call if you experience:   Fever/chills associated with headache or increased back/neck pain  Headache worsened by an upright position  New onset, weakness or numbness of an extremity below the injection site  Hives or difficulty breathing (go to the emergency room)  Inflammation or drainage at the injection site(s)  Severe back/neck pain greater than usual  New symptoms which are concerning to you  Please note:  Although the local anesthetic injected can often make your back or neck feel good for several hours after the injection, the pain will likely return. It takes 3-7 days for steroids to work.  You may not notice any pain relief for at least one week.  If effective, we will often do a series of 2-3 injections spaced 3-6 weeks apart to maximally decrease your pain.  After the initial series, you may be a candidate for a more permanent nerve block of the facets.  If you have any questions, please call #[phone]) [facility]

## 2016-09-25 NOTE — Progress Notes (Signed)
Safety precautions to be maintained throughout the outpatient stay will include: orient to surroundings, keep bed in low position, maintain call bell within reach at all times, provide assistance with transfer out of bed and ambulation.  

## 2016-10-01 ENCOUNTER — Other Ambulatory Visit: Payer: Self-pay | Admitting: *Deleted

## 2016-10-01 NOTE — Patient Outreach (Signed)
[facility]) Care Management  10/01/2016  [name]. 1956-01-05 [id number]   Return phone call from [facility] who agrees to assist patient install grab bars in his bathroom to avoid falls.   Per [name] patient will be first on the list for installation, however the work will not be able to get started for 2 weeks due to the installers availability.  Patient to be informed of plans to install the grab bars within the next 2 weeks.    [name] [facility] Care Management [phone]

## 2016-10-06 ENCOUNTER — Other Ambulatory Visit: Payer: Self-pay | Admitting: *Deleted

## 2016-10-06 DIAGNOSIS — J9611 Chronic respiratory failure with hypoxia: Secondary | ICD-10-CM | POA: Insufficient documentation

## 2016-10-06 DIAGNOSIS — F1190 Opioid use, unspecified, uncomplicated: Secondary | ICD-10-CM | POA: Insufficient documentation

## 2016-10-06 NOTE — Patient Outreach (Addendum)
  [facility]) Care Management  10/06/2016  Stanley Rios. [birth year] [id number]   Telephone follow up  Follow up telephone call to patient to follow up regarding date of surgery. Able to speak with [name] , she discussed patient recent visit to pulmonary doctor at [facility]. She discussed patient now has referral to pulmonary rehab and awaiting call phone regarding first visit. Patient also has referral to have a sleep study.   Mrs.Hoog discussed pulmonary has encouraged patient to wear his oxygen most of the day and take use his nebulizer treatment at least 2 to 4 times a day as needed. Patient denies having increase in shortness of breath or cough, wears oxygen at 3 liters on and off.   [name] discussed patient is tolerating sleeping on his hospital bed, with mattress and gel overlay pad  much better since she has purchased sheets that fit bed much better   Patient with possible  surgery at [facility] in the next 30 days related to diaphragmatic hernia, has office visit on  3/30 for further evaluation if he continues to be bothered by pain at area. . Will continue to follow patient for care management,education and support and assess for further needs. Patient continues to be non smoker praised for his continued success.   Patient is attending Pain clinic and has follow up appointment in the next week.   Plan Will follow up with patient in the next 2 weeks regarding surgery date and plan home visit in next month.  Patient will continue to wear oxygen as directed and increase activity as tolerated.  [name], RN, [place] [facility] [phone]- Mobile [phone]- Toll Free Main Office

## 2016-10-07 DIAGNOSIS — J189 Pneumonia, unspecified organism: Secondary | ICD-10-CM | POA: Diagnosis not present

## 2016-10-07 DIAGNOSIS — K458 Other specified abdominal hernia without obstruction or gangrene: Secondary | ICD-10-CM | POA: Diagnosis not present

## 2016-10-07 DIAGNOSIS — J449 Chronic obstructive pulmonary disease, unspecified: Secondary | ICD-10-CM | POA: Diagnosis not present

## 2016-10-08 ENCOUNTER — Other Ambulatory Visit: Payer: Self-pay | Admitting: Pain Medicine

## 2016-10-08 DIAGNOSIS — E559 Vitamin D deficiency, unspecified: Secondary | ICD-10-CM

## 2016-10-08 DIAGNOSIS — E538 Deficiency of other specified B group vitamins: Secondary | ICD-10-CM

## 2016-10-08 MED ORDER — VITAMIN D (ERGOCALCIFEROL) 1.25 MG (50000 UNIT) PO CAPS: ORAL_CAPSULE | ORAL | 0 refills | Status: DC

## 2016-10-08 MED ORDER — VITAMIN D3 50 MCG (2000 UT) PO CAPS: ORAL_CAPSULE | ORAL | 99 refills | Status: DC

## 2016-10-08 NOTE — Progress Notes (Signed)
Reason for ordering the test: To determine the cause of the diffuse arthralgias & myalgias. Finding(s): Low Vitamin D levels Explanation of findings:  Low Vitamin D Results: Normal levels: between 30 and 100 ng/mL. Vitamin D Insufficiency: Levels between 20-30 ng/ml are defined as a "Vitamin D insufficiency". Vitamin D Deficiency: Levels below 20 ng/ml, is diagnosed as a "Vitamin D Deficiency". Common causes include: dietary insufficiency; inadequate sun exposure; inability to absorb vitamin D from the intestines; or inability to process it due to kidney or liver disease. Low 25-hydroxyvitamin D: A low blood level of 25-hydroxyvitamin D may mean that a person is not getting enough exposure to sunlight or enough dietary vitamin D to meet his or her body's demand or that there is a problem with its absorption from the intestines. Occasionally, drugs used to treat seizures, particularly phenytoin (Dilantin), can interfere with the production of 25-hydroxyvitamin D in the liver. There is some evidence that vitamin D deficiency may increase the risk of some cancers, immune diseases, and cardiovascular disease. Low 1,25-dihydroxyvitamin D: A low level of 1,25-dihydroxyvitamin D can be seen in kidney disease and is one of the earliest changes to occur in persons with early kidney failure. Associated complications may include: hypocalcemia, hypophosphatemia, and reduced bone density. Associated symptoms: Vitamin D deficiencies and insufficiencies may be associated with fatigue, weakness, bone pain, joint pain, and muscle pain. Recommendation(s): Patient may benefit from taking over-the-counter Vitamin D3 supplements. I recommend a vitamin D + Calcium supplements. "Natures Bounty", a brand easily found in most pharmacies, has a formulation containing Calcium 1200 mg plus Vitamin D3 1000 IU, in Softgels capsules that are easy to swallow. This should be taken once a day, preferably in the morning as vitamin D will  increase energy levels and make it difficult to fall asleep, if taken at night. Patients with levels lower than 20 ng/ml should contact their primary care physicians to receive replacement therapy. Vitamin D3 can be obtained over-the-counter, without a prescription. Vitamin D2 requires a prescription and it is used for replacement therapy.  Reason for ordering the test: To determine the cause of the neurogenic pain Finding(s): Low Vitamin B-12 levels Explanation of findings:  Normal Vitamin B-12 level: Between 180 and 914 pg/mL Deficiency: levels below 180 pg/mL Insufficiency: levels between 200 and 500 pg/mL. These may be symptomatic, in which case it is considered an "insufficiency". Symptoms: (deficiency or insufficiency) tingling and numbness of the digits (fingers & toes), generalized muscle weakness, staggering, irritability, confusion, forgetfulness, tenderness, fatigue, shortness of breath, palpitation, anemia, sporadic episodes of diarrhea, decreased immune system, cognitive impairment, and degeneration of the posterior sensory columns of the spinal cord. Deficiency can lead to anemia and congestive heart failure. Lack of vitamin B12 may lead to peripheral neuropathy. Patient Recommendation(s): The recommended over-the-counter Vitamin B12 dose intake for deficiency is 125 to 2,000 micrograms of cyanocobalamin taken by mouth, daily.  - A normal sedimentation rate should be below 30 mm/hr. The sed rate is an acute phase reactant that indirectly measures the degree of inflammation present in the body. It can be acute, developing rapidly after trauma, injury or infection, for example, or can occur over an extended time (chronic) with conditions such as autoimmune diseases or cancer. The ESR is not diagnostic; it is a non-specific, screening test that may be elevated in a number of these different conditions. It provides general information about the presence or absence of an inflammatory  condition.  - While most low ALT level results indicate a normal  healthy liver, that may not always be the case. A low-functioning or non-functioning liver, lacking normal levels of ALT activity to begin with, would not release a lot of ALT into the blood when damaged. People infected with the hepatitis C virus initially show high ALT levels in their blood, but these levels fall over time. Because the ALT test measures ALT levels at only one point in time, people with chronic hepatitis C infection may already have experienced the ALT peak well before blood was drawn for the ALT test. Urinary tract infections or malnutrition may also cause low blood ALT levels.

## 2016-10-09 ENCOUNTER — Encounter: Payer: Self-pay | Admitting: Pain Medicine

## 2016-10-09 ENCOUNTER — Other Ambulatory Visit: Payer: Self-pay

## 2016-10-09 ENCOUNTER — Ambulatory Visit: Payer: PPO | Attending: Pain Medicine | Admitting: Pain Medicine

## 2016-10-09 VITALS — BP 110/67 | HR 72 | Temp 97.5°F | Resp 16 | Ht 71.0 in | Wt 187.0 lb

## 2016-10-09 DIAGNOSIS — M791 Myalgia: Secondary | ICD-10-CM | POA: Diagnosis not present

## 2016-10-09 DIAGNOSIS — E559 Vitamin D deficiency, unspecified: Secondary | ICD-10-CM | POA: Insufficient documentation

## 2016-10-09 DIAGNOSIS — M5136 Other intervertebral disc degeneration, lumbar region: Secondary | ICD-10-CM | POA: Diagnosis not present

## 2016-10-09 DIAGNOSIS — M7918 Myalgia, other site: Secondary | ICD-10-CM

## 2016-10-09 DIAGNOSIS — M533 Sacrococcygeal disorders, not elsewhere classified: Secondary | ICD-10-CM | POA: Diagnosis not present

## 2016-10-09 DIAGNOSIS — I10 Essential (primary) hypertension: Secondary | ICD-10-CM | POA: Insufficient documentation

## 2016-10-09 DIAGNOSIS — Z87891 Personal history of nicotine dependence: Secondary | ICD-10-CM | POA: Insufficient documentation

## 2016-10-09 DIAGNOSIS — M150 Primary generalized (osteo)arthritis: Secondary | ICD-10-CM

## 2016-10-09 DIAGNOSIS — M792 Neuralgia and neuritis, unspecified: Secondary | ICD-10-CM

## 2016-10-09 DIAGNOSIS — Z7951 Long term (current) use of inhaled steroids: Secondary | ICD-10-CM | POA: Insufficient documentation

## 2016-10-09 DIAGNOSIS — J9611 Chronic respiratory failure with hypoxia: Secondary | ICD-10-CM | POA: Insufficient documentation

## 2016-10-09 DIAGNOSIS — M961 Postlaminectomy syndrome, not elsewhere classified: Secondary | ICD-10-CM | POA: Insufficient documentation

## 2016-10-09 DIAGNOSIS — E538 Deficiency of other specified B group vitamins: Secondary | ICD-10-CM | POA: Insufficient documentation

## 2016-10-09 DIAGNOSIS — M545 Low back pain: Secondary | ICD-10-CM

## 2016-10-09 DIAGNOSIS — G4700 Insomnia, unspecified: Secondary | ICD-10-CM | POA: Insufficient documentation

## 2016-10-09 DIAGNOSIS — M159 Polyosteoarthritis, unspecified: Secondary | ICD-10-CM

## 2016-10-09 DIAGNOSIS — F419 Anxiety disorder, unspecified: Secondary | ICD-10-CM | POA: Diagnosis not present

## 2016-10-09 DIAGNOSIS — G8929 Other chronic pain: Secondary | ICD-10-CM

## 2016-10-09 DIAGNOSIS — Z79899 Other long term (current) drug therapy: Secondary | ICD-10-CM | POA: Diagnosis not present

## 2016-10-09 DIAGNOSIS — K589 Irritable bowel syndrome without diarrhea: Secondary | ICD-10-CM | POA: Insufficient documentation

## 2016-10-09 DIAGNOSIS — M488X6 Other specified spondylopathies, lumbar region: Secondary | ICD-10-CM | POA: Insufficient documentation

## 2016-10-09 DIAGNOSIS — M1990 Unspecified osteoarthritis, unspecified site: Secondary | ICD-10-CM | POA: Insufficient documentation

## 2016-10-09 DIAGNOSIS — M542 Cervicalgia: Secondary | ICD-10-CM

## 2016-10-09 DIAGNOSIS — G894 Chronic pain syndrome: Secondary | ICD-10-CM | POA: Diagnosis not present

## 2016-10-09 DIAGNOSIS — Z981 Arthrodesis status: Secondary | ICD-10-CM | POA: Diagnosis not present

## 2016-10-09 DIAGNOSIS — J439 Emphysema, unspecified: Secondary | ICD-10-CM | POA: Insufficient documentation

## 2016-10-09 DIAGNOSIS — Z5181 Encounter for therapeutic drug level monitoring: Secondary | ICD-10-CM | POA: Diagnosis not present

## 2016-10-09 DIAGNOSIS — K219 Gastro-esophageal reflux disease without esophagitis: Secondary | ICD-10-CM | POA: Insufficient documentation

## 2016-10-09 DIAGNOSIS — M4802 Spinal stenosis, cervical region: Secondary | ICD-10-CM | POA: Insufficient documentation

## 2016-10-09 DIAGNOSIS — F1190 Opioid use, unspecified, uncomplicated: Secondary | ICD-10-CM

## 2016-10-09 DIAGNOSIS — D72828 Other elevated white blood cell count: Secondary | ICD-10-CM | POA: Insufficient documentation

## 2016-10-09 DIAGNOSIS — Z79891 Long term (current) use of opiate analgesic: Secondary | ICD-10-CM | POA: Diagnosis not present

## 2016-10-09 DIAGNOSIS — M1288 Other specific arthropathies, not elsewhere classified, other specified site: Secondary | ICD-10-CM | POA: Diagnosis not present

## 2016-10-09 DIAGNOSIS — F329 Major depressive disorder, single episode, unspecified: Secondary | ICD-10-CM | POA: Diagnosis not present

## 2016-10-09 DIAGNOSIS — M5030 Other cervical disc degeneration, unspecified cervical region: Secondary | ICD-10-CM | POA: Diagnosis not present

## 2016-10-09 DIAGNOSIS — M25511 Pain in right shoulder: Secondary | ICD-10-CM

## 2016-10-09 DIAGNOSIS — Z8052 Family history of malignant neoplasm of bladder: Secondary | ICD-10-CM | POA: Diagnosis not present

## 2016-10-09 DIAGNOSIS — M25512 Pain in left shoulder: Secondary | ICD-10-CM

## 2016-10-09 DIAGNOSIS — H04129 Dry eye syndrome of unspecified lacrimal gland: Secondary | ICD-10-CM | POA: Insufficient documentation

## 2016-10-09 DIAGNOSIS — M47816 Spondylosis without myelopathy or radiculopathy, lumbar region: Secondary | ICD-10-CM

## 2016-10-09 MED ORDER — VITAMIN D3 50 MCG (2000 UT) PO CAPS: ORAL_CAPSULE | ORAL | 99 refills | Status: DC

## 2016-10-09 MED ORDER — MELOXICAM 15 MG PO TABS: 15.0000 mg | ORAL_TABLET | Freq: Every day | ORAL | 2 refills | Status: DC

## 2016-10-09 MED ORDER — TRAMADOL HCL 50 MG PO TABS: 50.0000 mg | ORAL_TABLET | Freq: Four times a day (QID) | ORAL | 2 refills | Status: DC | PRN

## 2016-10-09 MED ORDER — VITAMIN D (ERGOCALCIFEROL) 1.25 MG (50000 UNIT) PO CAPS: ORAL_CAPSULE | ORAL | 0 refills | Status: AC

## 2016-10-09 MED ORDER — GABAPENTIN 800 MG PO TABS: 800.0000 mg | ORAL_TABLET | Freq: Three times a day (TID) | ORAL | 2 refills | Status: DC

## 2016-10-09 MED ORDER — CYCLOBENZAPRINE HCL 10 MG PO TABS: 10.0000 mg | ORAL_TABLET | Freq: Three times a day (TID) | ORAL | 2 refills | Status: DC | PRN

## 2016-10-09 NOTE — Progress Notes (Signed)
Nursing Pain Medication Assessment:  Safety precautions to be maintained throughout the outpatient stay will include: orient to surroundings, keep bed in low position, maintain call bell within reach at all times, provide assistance with transfer out of bed and ambulation.  Medication Inspection Compliance: Pill count conducted under aseptic conditions, in front of the patient. Neither the pills nor the bottle was removed from the patient's sight at any time. Once count was completed pills were immediately returned to the patient in their original bottle.  Medication: Tramadol (Ultram) Pill/Patch Count: 57 of 120 pills remain Bottle Appearance: Standard pharmacy container. Clearly labeled. Filled Date:03/01/ 2018 Last Medication intake:  Today  Patient states he has a few pills at home in the pill box. Instructed to bring all pills next med refill visit.

## 2016-10-09 NOTE — Progress Notes (Signed)
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 Physician: Marygrace Drought, MD Location: [facility] Outpatient Pain Management Facility Note by: Kathlen Brunswick. Dossie Rios, M.D, DABA, DABAPM, DABPM, DABIPP, FIPP Date: 10/09/2016; Time: 3:53 PM  Pain Score Disclaimer: We use the NRS-11 scale. This is a self-reported, subjective measurement of pain severity with only modest accuracy. It is used primarily to identify changes within a particular patient. It must be understood that outpatient pain scales are significantly less accurate that those used for research, where they can be applied under ideal controlled circumstances with minimal exposure to variables. In reality, the score is likely to be a combination of pain intensity and pain affect, where pain affect describes the degree of emotional arousal or changes in action readiness caused by the sensory experience of pain. Factors such as social and work situation, setting, emotional state, anxiety levels, expectation, and prior pain experience may influence pain perception and show large inter-individual differences that may also be affected by  time variables.  Patient instructions provided during this appointment: There are no Patient Instructions on file for this visit.

## 2016-10-13 LAB — TOXASSURE SELECT 13 (MW), URINE

## 2016-10-20 ENCOUNTER — Ambulatory Visit (HOSPITAL_BASED_OUTPATIENT_CLINIC_OR_DEPARTMENT_OTHER): Payer: PPO | Admitting: Pain Medicine

## 2016-10-20 ENCOUNTER — Ambulatory Visit
Admission: RE | Admit: 2016-10-20 | Discharge: 2016-10-20 | Disposition: A | Discharge status: Home / Self Care | Payer: PPO | Source: Ambulatory Visit | Attending: Pain Medicine | Admitting: Pain Medicine

## 2016-10-20 ENCOUNTER — Encounter: Payer: Self-pay | Admitting: Pain Medicine

## 2016-10-20 VITALS — BP 143/77 | HR 92 | Temp 97.7°F | Resp 13 | Ht 71.0 in | Wt 187.0 lb

## 2016-10-20 DIAGNOSIS — M545 Low back pain, unspecified: Secondary | ICD-10-CM

## 2016-10-20 DIAGNOSIS — M488X6 Other specified spondylopathies, lumbar region: Secondary | ICD-10-CM | POA: Insufficient documentation

## 2016-10-20 DIAGNOSIS — M533 Sacrococcygeal disorders, not elsewhere classified: Secondary | ICD-10-CM | POA: Insufficient documentation

## 2016-10-20 DIAGNOSIS — M5136 Other intervertebral disc degeneration, lumbar region: Secondary | ICD-10-CM | POA: Diagnosis not present

## 2016-10-20 DIAGNOSIS — G8929 Other chronic pain: Secondary | ICD-10-CM | POA: Diagnosis not present

## 2016-10-20 DIAGNOSIS — M51369 Other intervertebral disc degeneration, lumbar region without mention of lumbar back pain or lower extremity pain: Secondary | ICD-10-CM

## 2016-10-20 DIAGNOSIS — Z981 Arthrodesis status: Secondary | ICD-10-CM | POA: Insufficient documentation

## 2016-10-20 DIAGNOSIS — M1288 Other specific arthropathies, not elsewhere classified, other specified site: Secondary | ICD-10-CM

## 2016-10-20 DIAGNOSIS — M47816 Spondylosis without myelopathy or radiculopathy, lumbar region: Secondary | ICD-10-CM

## 2016-10-20 MED ORDER — ROPIVACAINE HCL 5 MG/ML IJ SOLN
5.0000 mL | Freq: Once | INTRAMUSCULAR | Status: AC
  Administered 2016-10-20: 5 mL via PERINEURAL

## 2016-10-20 MED ORDER — TRIAMCINOLONE ACETONIDE 40 MG/ML IJ SUSP
40.0000 mg | Freq: Once | INTRAMUSCULAR | Status: AC
  Administered 2016-10-20: 40 mg

## 2016-10-20 MED ORDER — LIDOCAINE HCL (PF) 1 % IJ SOLN: 10.0000 mL | Freq: Once | INTRAMUSCULAR | Status: DC

## 2016-10-20 MED ORDER — ROPIVACAINE HCL 5 MG/ML IJ SOLN
INTRAMUSCULAR | Status: AC
  Filled 2016-10-20: qty 40

## 2016-10-20 MED ORDER — FENTANYL CITRATE (PF) 100 MCG/2ML IJ SOLN
INTRAMUSCULAR | Status: AC
  Filled 2016-10-20: qty 2

## 2016-10-20 MED ORDER — METHYLPREDNISOLONE ACETATE 80 MG/ML IJ SUSP
80.0000 mg | Freq: Once | INTRAMUSCULAR | Status: AC
  Administered 2016-10-20: 80 mg via INTRA_ARTICULAR

## 2016-10-20 MED ORDER — TRIAMCINOLONE ACETONIDE 40 MG/ML IJ SUSP
INTRAMUSCULAR | Status: AC
  Filled 2016-10-20: qty 2

## 2016-10-20 MED ORDER — ROPIVACAINE HCL 5 MG/ML IJ SOLN
9.0000 mL | Freq: Once | INTRAMUSCULAR | Status: AC
  Administered 2016-10-20: 9 mL via INTRA_ARTICULAR

## 2016-10-20 MED ORDER — FENTANYL CITRATE (PF) 100 MCG/2ML IJ SOLN
25.0000 ug | INTRAMUSCULAR | Status: DC | PRN
  Administered 2016-10-20: 50 ug via INTRAVENOUS

## 2016-10-20 MED ORDER — MIDAZOLAM HCL 5 MG/5ML IJ SOLN
INTRAMUSCULAR | Status: AC
Start: 2016-10-20 — End: 2016-10-20
  Filled 2016-10-20: qty 5

## 2016-10-20 MED ORDER — LACTATED RINGERS IV SOLN
1000.0000 mL | Freq: Once | INTRAVENOUS | Status: AC
  Administered 2016-10-20: 1000 mL via INTRAVENOUS

## 2016-10-20 MED ORDER — MIDAZOLAM HCL 5 MG/5ML IJ SOLN
1.0000 mg | INTRAMUSCULAR | Status: DC | PRN
  Administered 2016-10-20: 2 mg via INTRAVENOUS

## 2016-10-20 MED ORDER — METHYLPREDNISOLONE ACETATE 80 MG/ML IJ SUSP
INTRAMUSCULAR | Status: AC
  Filled 2016-10-20: qty 1

## 2016-10-20 NOTE — Patient Instructions (Addendum)

## 2016-10-20 NOTE — Progress Notes (Signed)
Safety precautions to be maintained throughout the outpatient stay will include: orient to surroundings, keep bed in low position, maintain call bell within reach at all times, provide assistance with transfer out of bed and ambulation.  

## 2016-10-20 NOTE — Progress Notes (Signed)
Patient's Name: Stanley Rios.  MRN: [id number]  Referring Provider: Milinda Pointer, MD  DOB: 03-17-1956  PCP: Marygrace Drought, MD  DOS: 10/20/2016  Note by: Kathlen Brunswick. Dossie Arbour, MD  Service setting: Ambulatory outpatient  Location: [facility] (AMB) Pain Management Facility  Visit type: Procedure  Specialty: Interventional Pain Management  Patient type: Established   Primary Reason for Visit: Interventional Pain Management Treatment. CC: Back Pain (lower bilaterally- left worse)  Procedure:  Anesthesia, Analgesia, Anxiolysis:  Procedure #1: Type: Diagnostic Medial Branch Facet Block Region: Lumbar Level: L2, L3, L4, L5, & S1 Medial Branch Level(s) Laterality: Bilateral  Procedure #2: Type: Diagnostic Sacroiliac Joint Block Region: Posterior Lumbosacral Level: PSIS (Posterior Superior Iliac Spine) Sacroiliac Joint Laterality: Bilateral  Type: Local Anesthesia with Moderate (Conscious) Sedation Local Anesthetic: Lidocaine 1% Route: Intravenous (IV) IV Access: Secured Sedation: Meaningful verbal contact was maintained at all times during the procedure  Indication(s): Analgesia and Anxiety  Indications: 1. Chronic low back pain (Location of Primary Source of Pain) (Bilateral) (R>L)   2. Facet syndrome, lumbar   3. DDD (degenerative disc disease), lumbar   4. Sacroiliac joint dysfunction    Pain Score: Pre-procedure: 3 /10 Post-procedure: 0-No pain/10  Pre-op Assessment:  Previous date of service: 10/09/16 Service provided: Med Refill Stanley Rios is a 61 y.o. (year old), male patient, seen today for interventional treatment. He  has a past surgical history that includes Cholecystectomy; Spinal fusion; and Neck surgery (2002 approx). His primarily concern today is the Back Pain (lower bilaterally- left worse)  Initial Vital Signs: Blood pressure 125/75, pulse 92, temperature 97.7 F (36.5 C), temperature source Oral, resp. rate 16, height '5\' 11"'$  (1.803 m), weight 187 lb (84.8  kg). BMI: 26.08 kg/m  Risk Assessment: Allergies: Reviewed. He has No Known Allergies.  Allergy Precautions: None required Coagulopathies: "Reviewed. None identified.  Blood-thinner therapy: None at this time Active Infection(s): Reviewed. None identified. Stanley Rios is afebrile  Site Confirmation: Stanley Rios was asked to confirm the procedure and laterality before marking the site Procedure checklist: Completed Consent: Before the procedure and under the influence of no sedative(s), amnesic(s), or anxiolytics, the patient was informed of the treatment options, risks and possible complications. To fulfill our ethical and legal obligations, as recommended by the American Medical Association's Code of Ethics, I have informed the patient of my clinical impression; the nature and purpose of the treatment or procedure; the risks, benefits, and possible complications of the intervention; the alternatives, including doing nothing; the risk(s) and benefit(s) of the alternative treatment(s) or procedure(s); and the risk(s) and benefit(s) of doing nothing. The patient was provided information about the general risks and possible complications associated with the procedure. These may include, but are not limited to: failure to achieve desired goals, infection, bleeding, organ or nerve damage, allergic reactions, paralysis, and death. In addition, the patient was informed of those risks and complications associated to Spine-related procedures, such as failure to decrease pain; infection (i.e.: Meningitis, epidural or intraspinal abscess); bleeding (i.e.: epidural hematoma, subarachnoid hemorrhage, or any other type of intraspinal or peri-dural bleeding); organ or nerve damage (i.e.: Any type of peripheral nerve, nerve root, or spinal cord injury) with subsequent damage to sensory, motor, and/or autonomic systems, resulting in permanent pain, numbness, and/or weakness of one or several areas of the body; allergic  reactions; (i.e.: anaphylactic reaction); and/or death. Furthermore, the patient was informed of those risks and complications associated with the medications. These include, but are not limited to: allergic reactions (i.e.:  anaphylactic or anaphylactoid reaction(s)); adrenal axis suppression; blood sugar elevation that in diabetics may result in ketoacidosis or comma; water retention that in patients with history of congestive heart failure may result in shortness of breath, pulmonary edema, and decompensation with resultant heart failure; weight gain; swelling or edema; medication-induced neural toxicity; particulate matter embolism and blood vessel occlusion with resultant organ, and/or nervous system infarction; and/or aseptic necrosis of one or more joints. Finally, the patient was informed that Medicine is not an exact science; therefore, there is also the possibility of unforeseen or unpredictable risks and/or possible complications that may result in a catastrophic outcome. The patient indicated having understood very clearly. We have given the patient no guarantees and we have made no promises. Enough time was given to the patient to ask questions, all of which were answered to the patient's satisfaction. Stanley Rios has indicated that he wanted to continue with the procedure. Attestation: I, the ordering provider, attest that I have discussed with the patient the benefits, risks, side-effects, alternatives, likelihood of achieving goals, and potential problems during recovery for the procedure that I have provided informed consent. Date: 10/20/2016; Time: 9:34 AM  Pre-Procedure Preparation:  Monitoring: As per clinic protocol. Respiration, ETCO2, SpO2, BP, heart rate and rhythm monitor placed and checked for adequate function Safety Precautions: Patient was assessed for positional comfort and pressure points before starting the procedure. Time-out: I initiated and conducted the "Time-out" before  starting the procedure, as per protocol. The patient was asked to participate by confirming the accuracy of the "Time Out" information. Verification of the correct person, site, and procedure were performed and confirmed by me, the nursing staff, and the patient. "Time-out" conducted as per Joint Commission's Universal Protocol (UP.01.01.01). "Time-out" Date & Time: 10/20/2016; 1012 hrs.  Description of Procedure #1 Process:   Time-out: "Time-out" completed before starting procedure, as per protocol. Position: Prone Target Area: For Lumbar Facet blocks, the target is the groove formed by the junction of the transverse process and superior articular process. For the L5 dorsal ramus, the target is the notch between superior articular process and sacral ala. For the S1 dorsal ramus, the target is the superior and lateral edge of the posterior S1 Sacral foramen. Approach: Paramedial approach. Area Prepped: Entire Posterior Lumbosacral Region Prepping solution: ChloraPrep (2% chlorhexidine gluconate and 70% isopropyl alcohol) Safety Precautions: Aspiration looking for blood return was conducted prior to all injections. At no point did we inject any substances, as a needle was being advanced. No attempts were made at seeking any paresthesias. Safe injection practices and needle disposal techniques used. Medications properly checked for expiration dates. SDV (single dose vial) medications used.  Description of the Procedure: Protocol guidelines were followed. The patient was placed in position over the fluoroscopy table. The target area was identified and the area prepped in the usual manner. Skin desensitized using vapocoolant spray. Skin & deeper tissues infiltrated with local anesthetic. Appropriate amount of time allowed to pass for local anesthetics to take effect. The procedure needle was introduced through the skin, ipsilateral to the reported pain, and advanced to the target area. Employing the "Medial  Branch Technique", the needles were advanced to the angle made by the superior and medial portion of the transverse process, and the lateral and inferior portion of the superior articulating process of the targeted vertebral bodies. This area is known as "Burton's Eye" or the "Eye of the [place] Dog". A procedure needle was introduced through the skin, and this time advanced to  the angle made by the superior and medial border of the sacral ala, and the lateral border of the S1 vertebral body. This last needle was later repositioned at the superior and lateral border of the posterior S1 foramen. Negative aspiration confirmed. Solution injected in intermittent fashion, asking for systemic symptoms every 0.5cc of injectate. The needles were then removed and the area cleansed, making sure to leave some of the prepping solution back to take advantage of its long term bactericidal properties. Start Time: 1012 hrs. Materials:  Needle(s) Type: Regular needle Gauge: 22G Length: 3.5-in Medication(s): We administered fentaNYL, lactated ringers, midazolam, triamcinolone acetonide, triamcinolone acetonide, ropivacaine (PF) 5 mg/mL (0.5%), ropivacaine (PF) 5 mg/mL (0.5%), methylPREDNISolone acetate, and ropivacaine (PF) 5 mg/mL (0.5%). Please see chart orders for dosing details.  Description of Procedure # 2 Process:   Position: Prone Target Area: For upper sacroiliac joint block(s), the target is the superior and posterior margin of the sacroiliac joint. Approach: Ipsilateral approach. Area Prepped: Entire Posterior Lumbosacral Region Prepping solution: ChloraPrep (2% chlorhexidine gluconate and 70% isopropyl alcohol) Safety Precautions: Aspiration looking for blood return was conducted prior to all injections. At no point did we inject any substances, as a needle was being advanced. No attempts were made at seeking any paresthesias. Safe injection practices and needle disposal techniques used. Medications  properly checked for expiration dates. SDV (single dose vial) medications used. Description of the Procedure: Protocol guidelines were followed. The patient was placed in position over the fluoroscopy table. The target area was identified and the area prepped in the usual manner. Skin desensitized using vapocoolant spray. Skin & deeper tissues infiltrated with local anesthetic. Appropriate amount of time allowed to pass for local anesthetics to take effect. The procedure needle was advanced under fluoroscopic guidance into the sacroiliac joint until a firm endpoint was obtained. Proper needle placement secured. Negative aspiration confirmed. Solution injected in intermittent fashion, asking for systemic symptoms every 0.5cc of injectate. The needles were then removed and the area cleansed, making sure to leave some of the prepping solution back to take advantage of its long term bactericidal properties. Vitals:   10/20/16 1025 10/20/16 1031 10/20/16 1041 10/20/16 1051  BP: 110/83 (!) 156/74 137/66 (!) 143/77  Pulse:      Resp: '16 12 13 13  '$ Temp:      TempSrc:      SpO2: 98% 95% 95% 95%  Weight:      Height:        End Time: 1025 hrs. Materials:  Needle(s) Type: Regular needle Gauge: 22G Length: 3.5-in Medication(s): We administered fentaNYL, lactated ringers, midazolam, triamcinolone acetonide, triamcinolone acetonide, ropivacaine (PF) 5 mg/mL (0.5%), ropivacaine (PF) 5 mg/mL (0.5%), methylPREDNISolone acetate, and ropivacaine (PF) 5 mg/mL (0.5%). Please see chart orders for dosing details.  Imaging Guidance (Spinal):  Type of Imaging Technique: Fluoroscopy Guidance (Spinal) Indication(s): Assistance in needle guidance and placement for procedures requiring needle placement in or near specific anatomical locations not easily accessible without such assistance. Exposure Time: Please see nurses notes. Contrast: None used. Fluoroscopic Guidance: I was personally present during the use of  fluoroscopy. "Tunnel Vision Technique" used to obtain the best possible view of the target area. Parallax error corrected before commencing the procedure. "Direction-depth-direction" technique used to introduce the needle under continuous pulsed fluoroscopy. Once target was reached, antero-posterior, oblique, and lateral fluoroscopic projection used confirm needle placement in all planes. Images permanently stored in EMR. Interpretation: No contrast injected. I personally interpreted the imaging intraoperatively. Adequate needle placement confirmed  in multiple planes. Permanent images saved into the patient's record.  Antibiotic Prophylaxis:  Indication(s): None identified Antibiotic given: None  Post-operative Assessment:  EBL: None Complications: No immediate post-treatment complications observed by team, or reported by patient. Note: The patient tolerated the entire procedure well. A repeat set of vitals were taken after the procedure and the patient was kept under observation following institutional policy, for this type of procedure. Post-procedural neurological assessment was performed, showing return to baseline, prior to discharge. The patient was provided with post-procedure discharge instructions, including a section on how to identify potential problems. Should any problems arise concerning this procedure, the patient was given instructions to immediately contact us, at any time, without hesitation. In any case, we plan to contact the patient by telephone for a follow-up status report regarding this interventional procedure. Comments:  No additional relevant information.  Plan of Care  Disposition: Discharge home  Discharge Date & Time: 10/20/2016; 1055 hrs.  Physician-requested Follow-up:  Return in about 2 weeks (around 11/03/2016) for Post-Procedure evaluation.  Future Appointments Date Time Provider [place]  11/03/2016 1:45 PM Milinda Pointer, MD [facility] None    Medications ordered for procedure: Meds ordered this encounter  Medications  . fentaNYL (SUBLIMAZE) injection 25-50 mcg    Make sure Narcan is available in the pyxis when using this medication. In the event of respiratory depression (RR< 8/min): Titrate NARCAN (naloxone) in increments of 0.1 to 0.2 mg IV at 2-3 minute intervals, until desired degree of reversal.  . lactated ringers infusion 1,000 mL  . midazolam (VERSED) 5 MG/5ML injection 1-2 mg    Make sure Flumazenil is available in the pyxis when using this medication. If oversedation occurs, administer 0.2 mg IV over 15 sec. If after 45 sec no response, administer 0.2 mg again over 1 min; may repeat at 1 min intervals; not to exceed 4 doses (1 mg)  . triamcinolone acetonide (KENALOG-40) injection 40 mg  . triamcinolone acetonide (KENALOG-40) injection 40 mg  . lidocaine (PF) (XYLOCAINE) 1 % injection 10 mL  . ropivacaine (PF) 5 mg/mL (0.5%) (NAROPIN) injection 5 mL    Preservative-free (MPF), single use vial.  . ropivacaine (PF) 5 mg/mL (0.5%) (NAROPIN) injection 5 mL    Preservative-free (MPF), single use vial.  . methylPREDNISolone acetate (DEPO-MEDROL) injection 80 mg  . ropivacaine (PF) 5 mg/mL (0.5%) (NAROPIN) injection 9 mL  . lidocaine (PF) (XYLOCAINE) 1 % injection 10 mL   Medications administered: We administered fentaNYL, lactated ringers, midazolam, triamcinolone acetonide, triamcinolone acetonide, ropivacaine (PF) 5 mg/mL (0.5%), ropivacaine (PF) 5 mg/mL (0.5%), methylPREDNISolone acetate, and ropivacaine (PF) 5 mg/mL (0.5%).  See the medical record for exact dosing, route, and time of administration.  Lab-work, Procedure(s), & Referral(s) Ordered: Orders Placed This Encounter  Procedures  . SACROILIAC JOINT INJECTINS  . DG C-Arm 1-60 Min-No Report  . Discharge instructions  . Follow-up  . Informed Consent Details: Transcribe to consent form and obtain patient signature  . Provider attestation of informed consent  for procedure/surgical case  . Verify informed consent   Imaging Ordered: No results found for this or any previous visit. New Prescriptions   No medications on file   Primary Care Physician: Marygrace Drought, MD Location: [facility] Out[facility] Pain Management Facility Note by: Kathlen Brunswick. Dossie Arbour, M.D, DABA, DABAPM, DABPM, DABIPP, FIPP Date: 10/20/2016; Time: 12:21 PM  Disclaimer:  Medicine is not an Chief Strategy Officer. The only guarantee in medicine is that nothing is guaranteed. It is important to note that the decision  to proceed with this intervention was based on the information collected from the patient. The Data and conclusions were drawn from the patient's questionnaire, the interview, and the physical examination. Because the information was provided in large part by the patient, it cannot be guaranteed that it has not been purposely or unconsciously manipulated. Every effort has been made to obtain as much relevant data as possible for this evaluation. It is important to note that the conclusions that lead to this procedure are derived in large part from the available data. Always take into account that the treatment will also be dependent on availability of resources and existing treatment guidelines, considered by other Pain Management Practitioners as being common knowledge and practice, at the time of the intervention. For Medico-Legal purposes, it is also important to point out that variation in procedural techniques and pharmacological choices are the acceptable norm. The indications, contraindications, technique, and results of the above procedure should only be interpreted and judged by a Board-Certified Interventional Pain Specialist with extensive familiarity and expertise in the same exact procedure and technique. Attempts at providing opinions without similar or greater experience and expertise than that of the treating physician will be considered as inappropriate and unethical, and shall  result in a formal complaint to the state medical board and applicable specialty societies.  Instructions provided at this appointment: Patient Instructions  Post-Procedure instructions Instructions:  Apply ice: Fill a plastic sandwich bag with crushed ice. Cover it with a small towel and apply to injection site. Apply for 15 minutes then remove x 15 minutes. Repeat sequence on day of procedure, until you go to bed. The purpose is to minimize swelling and discomfort after procedure.  Apply heat: Apply heat to procedure site starting the day following the procedure. The purpose is to treat any soreness and discomfort from the procedure.  Food intake: Start with clear liquids (like water) and advance to regular food, as tolerated.   Physical activities: Keep activities to a minimum for the first 8 hours after the procedure.   Driving: If you have received any sedation, you are not allowed to drive for 24 hours after your procedure.  Blood thinner: Restart your blood thinner 6 hours after your procedure. (Only for those taking blood thinners)  Insulin: As soon as you can eat, you may resume your normal dosing schedule. (Only for those taking insulin)  Infection prevention: Keep procedure site clean and dry.  Post-procedure Pain Diary: Extremely important that this be done correctly and accurately. Recorded information will be used to determine the next step in treatment.  Pain evaluated is that of treated area only. Do not include pain from an untreated area.  Complete every hour, on the hour, for the initial 8 hours. Set an alarm to help you do this part accurately.  Do not go to sleep and have it completed later. It will not be accurate.  Follow-up appointment: Keep your follow-up appointment after the procedure. Usually 2 weeks for most procedures. (6 weeks in the case of radiofrequency.) Bring you pain diary.  Expect:  From numbing medicine (AKA: Local Anesthetics): Numbness or  decrease in pain.  Onset: Full effect within 15 minutes of injected.  Duration: It will depend on the type of local anesthetic used. On the average, 1 to 8 hours.   From steroids: Decrease in swelling or inflammation. Once inflammation is improved, relief of the pain will follow.  Onset of benefits: Depends on the amount of swelling present. The more swelling,  the longer it will take for the benefits to be seen.   Duration: Steroids will stay in the system x 2 weeks. Duration of benefits will depend on multiple posibilities including persistent irritating factors.  From procedure: Some discomfort is to be expected once the numbing medicine wears off. This should be minimal if ice and heat are applied as instructed. Call if:  You experience numbness and weakness that gets worse with time, as opposed to wearing off.  New onset bowel or bladder incontinence. (Spinal procedures only)  Emergency Numbers:  Durning business hours (Monday - Thursday, 8:00 AM - 4:00 PM) (Friday, 9:00 AM - 12:00 Noon): [phone]  After hours: [phone]   __________________________________________________________________________________________   Pain Management Discharge Instructions  General Discharge Instructions :  If you need to reach your doctor call: Monday-Friday 8:00 am - 4:00 pm at [phone] or toll free [phone].  After clinic hours [phone] to have operator reach doctor.  Bring all of your medication bottles to all your appointments in the pain clinic.  To cancel or reschedule your appointment with Pain Management please remember to call 24 hours in advance to avoid a fee.  Refer to the educational materials which you have been given on: General Risks, I had my Procedure. Discharge Instructions, Post Sedation.  Post Procedure Instructions:  The drugs you were given will stay in your system until tomorrow, so for the next 24 hours you should not drive, make any legal  decisions or drink any alcoholic beverages.  You may eat anything you prefer, but it is better to start with liquids then soups and crackers, and gradually work up to solid foods.  Please notify your doctor immediately if you have any unusual bleeding, trouble breathing or pain that is not related to your normal pain.  Depending on the type of procedure that was done, some parts of your body may feel week and/or numb.  This usually clears up by tonight or the next day.  Walk with the use of an assistive device or accompanied by an adult for the 24 hours.  You may use ice on the affected area for the first 24 hours.  Put ice in a Ziploc bag and cover with a towel and place against area 15 minutes on 15 minutes off.  You may switch to heat after 24 hours.

## 2016-10-21 ENCOUNTER — Telehealth: Payer: Self-pay | Admitting: *Deleted

## 2016-10-21 NOTE — Telephone Encounter (Signed)
Denies problems post procedure. 

## 2016-10-23 ENCOUNTER — Ambulatory Visit: Payer: PPO | Admitting: Pain Medicine

## 2016-10-24 DIAGNOSIS — J449 Chronic obstructive pulmonary disease, unspecified: Secondary | ICD-10-CM | POA: Diagnosis not present

## 2016-10-24 DIAGNOSIS — K440 Diaphragmatic hernia with obstruction, without gangrene: Secondary | ICD-10-CM | POA: Diagnosis not present

## 2016-10-28 ENCOUNTER — Other Ambulatory Visit: Payer: Self-pay | Admitting: *Deleted

## 2016-10-28 NOTE — Patient Outreach (Signed)
[facility]) Care Management  10/28/2016  Stanley Rios. 07-05-1956 [id number]  Telephone follow up  1025 Placed call to [place] [facility], [name] request return call later .  1600  Returned call to patient able to speak to his wife [name], she reporst that patient had follow up visit on last week regarding possibility of having surgery related to diaphragmatic hernia mesh repair, she states the doctor is not planning any surgery at this time they want to work on getting his lungs better.  [name] discussed patient needs to have a sleep study done and that he may possibly need a CPAP.  [name] discussed that she hasn't heard from [facility] regarding referral for pulmonary rehab.   [name] discussed that [employer] as installed grab bar at tub area and they are very pleased.  Plan Will follow up regarding referral for Pulmonary Rehab patient want to attend in [place] area.  Will notify [employer], LCSW that grab bars have been installed. Will follow up patient in the next week for home visit for continued management of COPD.  [name], RN, [facility] [phone]- Mobile [phone]- Toll Free Main Office

## 2016-10-30 ENCOUNTER — Other Ambulatory Visit: Payer: Self-pay | Admitting: *Deleted

## 2016-10-30 NOTE — Patient Outreach (Signed)
[facility]) Care Management  10/30/2016  Stanley Rios. Jan 17, 1956 [id number]   Phone call to patient and patient's wife. This Education officer, [employer] was able to confirm that [name] was able to install the grab bars in the bathroom to help avoid falls. Patient very appreciative of the work done.  Patient requesting assistance with clothing due to recent weight gain and lack of funds.    Plan: This social worker will complete a referral for [facility] [employer] for a Ambulance person.    [name] [facility] Care Management [phone]

## 2016-11-03 ENCOUNTER — Ambulatory Visit: Payer: PPO | Attending: Pain Medicine | Admitting: Pain Medicine

## 2016-11-03 ENCOUNTER — Other Ambulatory Visit: Payer: Self-pay | Admitting: *Deleted

## 2016-11-03 ENCOUNTER — Encounter: Payer: Self-pay | Admitting: Pain Medicine

## 2016-11-03 VITALS — BP 145/80 | HR 94 | Temp 98.0°F | Resp 16 | Ht 71.0 in | Wt 187.0 lb

## 2016-11-03 DIAGNOSIS — M5136 Other intervertebral disc degeneration, lumbar region: Secondary | ICD-10-CM | POA: Diagnosis not present

## 2016-11-03 DIAGNOSIS — E785 Hyperlipidemia, unspecified: Secondary | ICD-10-CM | POA: Insufficient documentation

## 2016-11-03 DIAGNOSIS — G8929 Other chronic pain: Secondary | ICD-10-CM

## 2016-11-03 DIAGNOSIS — M4802 Spinal stenosis, cervical region: Secondary | ICD-10-CM | POA: Diagnosis not present

## 2016-11-03 DIAGNOSIS — Z79899 Other long term (current) drug therapy: Secondary | ICD-10-CM | POA: Diagnosis not present

## 2016-11-03 DIAGNOSIS — K589 Irritable bowel syndrome without diarrhea: Secondary | ICD-10-CM | POA: Diagnosis not present

## 2016-11-03 DIAGNOSIS — Z8052 Family history of malignant neoplasm of bladder: Secondary | ICD-10-CM | POA: Diagnosis not present

## 2016-11-03 DIAGNOSIS — Z981 Arthrodesis status: Secondary | ICD-10-CM | POA: Insufficient documentation

## 2016-11-03 DIAGNOSIS — Z79891 Long term (current) use of opiate analgesic: Secondary | ICD-10-CM | POA: Diagnosis not present

## 2016-11-03 DIAGNOSIS — M533 Sacrococcygeal disorders, not elsewhere classified: Secondary | ICD-10-CM

## 2016-11-03 DIAGNOSIS — Z87891 Personal history of nicotine dependence: Secondary | ICD-10-CM | POA: Diagnosis not present

## 2016-11-03 DIAGNOSIS — M545 Low back pain, unspecified: Secondary | ICD-10-CM

## 2016-11-03 DIAGNOSIS — M5030 Other cervical disc degeneration, unspecified cervical region: Secondary | ICD-10-CM | POA: Insufficient documentation

## 2016-11-03 DIAGNOSIS — G894 Chronic pain syndrome: Secondary | ICD-10-CM | POA: Diagnosis not present

## 2016-11-03 DIAGNOSIS — F329 Major depressive disorder, single episode, unspecified: Secondary | ICD-10-CM | POA: Diagnosis not present

## 2016-11-03 DIAGNOSIS — E538 Deficiency of other specified B group vitamins: Secondary | ICD-10-CM | POA: Insufficient documentation

## 2016-11-03 DIAGNOSIS — F419 Anxiety disorder, unspecified: Secondary | ICD-10-CM | POA: Diagnosis not present

## 2016-11-03 DIAGNOSIS — M47816 Spondylosis without myelopathy or radiculopathy, lumbar region: Secondary | ICD-10-CM

## 2016-11-03 DIAGNOSIS — G8912 Acute post-thoracotomy pain: Secondary | ICD-10-CM

## 2016-11-03 DIAGNOSIS — I10 Essential (primary) hypertension: Secondary | ICD-10-CM | POA: Diagnosis not present

## 2016-11-03 DIAGNOSIS — K219 Gastro-esophageal reflux disease without esophagitis: Secondary | ICD-10-CM | POA: Diagnosis not present

## 2016-11-03 DIAGNOSIS — Z7951 Long term (current) use of inhaled steroids: Secondary | ICD-10-CM | POA: Insufficient documentation

## 2016-11-03 DIAGNOSIS — M1990 Unspecified osteoarthritis, unspecified site: Secondary | ICD-10-CM | POA: Insufficient documentation

## 2016-11-03 DIAGNOSIS — G548 Other nerve root and plexus disorders: Secondary | ICD-10-CM | POA: Diagnosis not present

## 2016-11-03 DIAGNOSIS — E559 Vitamin D deficiency, unspecified: Secondary | ICD-10-CM | POA: Insufficient documentation

## 2016-11-03 DIAGNOSIS — M1288 Other specific arthropathies, not elsewhere classified, other specified site: Secondary | ICD-10-CM

## 2016-11-03 DIAGNOSIS — G4700 Insomnia, unspecified: Secondary | ICD-10-CM | POA: Insufficient documentation

## 2016-11-03 DIAGNOSIS — M488X6 Other specified spondylopathies, lumbar region: Secondary | ICD-10-CM | POA: Diagnosis not present

## 2016-11-03 DIAGNOSIS — J439 Emphysema, unspecified: Secondary | ICD-10-CM | POA: Diagnosis not present

## 2016-11-03 DIAGNOSIS — G580 Intercostal neuropathy: Secondary | ICD-10-CM | POA: Diagnosis not present

## 2016-11-03 DIAGNOSIS — J9611 Chronic respiratory failure with hypoxia: Secondary | ICD-10-CM | POA: Insufficient documentation

## 2016-11-03 DIAGNOSIS — M961 Postlaminectomy syndrome, not elsewhere classified: Secondary | ICD-10-CM | POA: Diagnosis not present

## 2016-11-03 NOTE — Progress Notes (Signed)
Patient's Name: Stanley Rios.  MRN: [id number]  Referring Provider: Marygrace Drought, MD  DOB: Jan 22, 1956  PCP: Stanley Drought, MD  DOS: 11/03/2016  Note by: Stanley Rios. Stanley Arbour, MD  Service setting: Ambulatory outpatient  Specialty: Interventional Pain Management  Location: [facility] (AMB) Pain Management Facility    Patient type: Established   Primary Reason(s) for Visit: Encounter for post-procedure evaluation of chronic illness with mild to moderate exacerbation CC: Back Pain (lower) and Neck Pain (bilaterally to hands- effects all 4 fingers )  HPI  Stanley Rios is a 61 y.o. year old, male patient, who comes today for a post-procedure evaluation. He has Opiate withdrawal (HCC); COPD (chronic obstructive pulmonary disease) ([place]); HTN (hypertension); Depression; Anxiety; Neutrophilic leukocytosis; DDD (degenerative disc disease), lumbar; Lumbar facet syndrome (Bilateral) (R>L); Sacroiliac joint pain (Bilateral) (R>L); DDD (degenerative disc disease), cervical; Cervical facet syndrome; Pressure injury of skin; Chronic airway obstruction ([place]); Chronic pain syndrome; Depressive disorder; Dry eye syndrome; Esophageal reflux; Hypertension; Insomnia; Irritable bowel syndrome (IBS); Chronic low back pain (Location of Primary Source of Pain) (Bilateral) (R>L); Posterior subcapsular cataract, bilateral; Cervical post-laminectomy syndrome; Pulmonary emphysema ([place]); Seborrheic keratoses; Spinal stenosis in cervical region; Testosterone deficiency; Tobacco use disorder; Umbilical hernia; Vitamin D deficiency; Vitreomacular adhesion of right eye; Long term current use of opiate analgesic; Opiate use; Long term prescription opiate use; Chronic hip pain; Chronic shoulder pain (Location of Tertiary source of pain) (Bilateral) (R>L); Lower extremity weakness; Chronic neck pain (Location of Secondary source of pain) (Bilateral) (R>L); Chronic lower extremity pain (Bilateral) (R>L); B12 deficiency; Osteoarthritis;  Neurogenic pain; Musculoskeletal pain; Diaphragmatic hernia; Chronic hypoxemic respiratory failure ([place]); Chronic narcotic use; Intercostal neuropathic pain (Left); and Post-thoracotomy pain syndrome (Left) on his problem list. His primarily concern today is the Back Pain (lower) and Neck Pain (bilaterally to hands- effects all 4 fingers )  Pain Assessment: Self-Reported Pain Score: 2 /10             Reported level is compatible with observation.       Pain Type:  (Pthaving pain left side from mesh remain from hernia surgery) Pain Location: Back Pain Orientation: Lower Pain Descriptors / Indicators: Constant, Nagging, Aching, Other (Comment) (weak) Pain Frequency: Constant  Stanley Rios comes in today for post-procedure evaluation after the treatment done on 10/20/2016. The patient indicates good relief of the pain for the duration of the local anesthetic with some persistent benefit since the diagnostic bilateral lumbar facet block + diagnostic bilateral sacroiliac joint block under fluoroscopic guidance and IV sedation. The patient indicates that he continues to have some problems with his breathing secondary to his COPD. He recently saw Stanley Rios on a referral or his left rib pain. Reviewing the physician's notes, I would appear that the reason why Stanley Rios is having some pain is secondary to his high alcohol hernia mesh having become detached from the area of the lower loading ribs on the left side, meaning the T11 and T12 ribs. According to Stanley Rios's note, he would like to postpone any corrective surgery until we try some injections to control the pain. In reviewing the patient's problem, I think that he would benefit from a trial of a left-sided T11 and T12 to nerve blocks. If he does get good relief of the pain with the diagnostic block, he may be a candidate for radiofrequency.  Further details on both, my assessment(s), as well as the proposed treatment plan,  please see below.  Post-Procedure Assessment  10/20/2016 Procedure: Diagnostic bilateral lumbar facet + sacroiliac joint block under fluoroscopic guidance and IV sedation Pre-procedure pain score:  3/10 Post-procedure pain score: 0/10 (100% relief) Influential Factors: BMI: 26.08 kg/m Intra-procedural challenges: None observed Assessment challenges: None detected         Post-procedural side-effects, adverse reactions, or complications: None reported Reported issues: None  Sedation: Sedation provided. When no sedatives are used, the analgesic levels obtained are directly associated to the effectiveness of the local anesthetics. However, when sedation is provided, the level of analgesia obtained during the initial 1 hour following the intervention, is believed to be the result of a combination of factors. These factors may include, but are not limited to: 1. The effectiveness of the local anesthetics used. 2. The effects of the analgesic(s) and/or anxiolytic(s) used. 3. The degree of discomfort experienced by the patient at the time of the procedure. 4. The patients ability and reliability in recalling and recording the events. 5. The presence and influence of possible secondary gains and/or psychosocial factors. Reported result: Relief experienced during the 1st hour after the procedure: 100 % (Ultra-Short Term Relief) Interpretative annotation: Analgesia during this period is likely to be Local Anesthetic and/or IV Sedative (Analgesic/Anxiolitic) related.          Effects of local anesthetic: The analgesic effects attained during this period are directly associated to the localized infiltration of local anesthetics and therefore cary significant diagnostic value as to the etiological location, or anatomical origin, of the pain. Expected duration of relief is directly dependent on the pharmacodynamics of the local anesthetic used. Long-acting (4-6 hours) anesthetics used.  Reported result:  Relief during the next 4 to 6 hour after the procedure: 100 % (Short-Term Relief) Interpretative annotation: Complete relief would suggest area to be the source of the pain.          Long-term benefit: Defined as the period of time past the expected duration of local anesthetics. With the possible exception of prolonged sympathetic blockade from the local anesthetics, benefits during this period are typically attributed to, or associated with, other factors such as analgesic sensory neuropraxia, antiinflammatory effects, or beneficial biochemical changes provided by agents other than the local anesthetics Reported result: Extended relief following procedure: 75 % (Long-Term Relief) Interpretative annotation: Good relief. This could suggest inflammation to be a significant component in the etiology to the pain.          Current benefits: Defined as persistent relief that continues at this point in time.   Reported results: Treated area: >50 % Mr. Janes reports improvement in function Interpretative annotation: Ongoing benefits would suggest effective therapeutic approach  Interpretation: Results would suggest that repeating the procedure may be necessary, for therapeutic reasons  Laboratory Chemistry  Inflammation Markers Lab Results  Component Value Date   CRP <0.8 08/07/2016   ESRSEDRATE 24 (H) 08/07/2016   (CRP: Acute Phase) (ESR: Chronic Phase) Renal Function Markers Lab Results  Component Value Date   BUN 12 08/07/2016   CREATININE 0.90 08/07/2016   GFRAA >60 08/07/2016   GFRNONAA >60 08/07/2016   Hepatic Function Markers Lab Results  Component Value Date   AST 20 08/07/2016   ALT 16 (L) 08/07/2016   ALBUMIN 3.6 08/07/2016   ALKPHOS 80 08/07/2016   Electrolytes Lab Results  Component Value Date   NA 142 08/07/2016   K 3.6 08/07/2016   CL 108 08/07/2016   CALCIUM 8.9 08/07/2016   MG 1.7 08/07/2016   Neuropathy Markers Lab Results  Component  Value Date   VITAMINB12  176 (L) 08/07/2016   Bone Pathology Markers Lab Results  Component Value Date   ALKPHOS 80 08/07/2016   25OHVITD1 27 (L) 08/07/2016   25OHVITD2 20 08/07/2016   25OHVITD3 7.0 08/07/2016   CALCIUM 8.9 08/07/2016   Coagulation Parameters Lab Results  Component Value Date   INR 0.83 04/11/2016   LABPROT 11.4 04/11/2016   PLT 318 04/18/2016   Cardiovascular Markers Lab Results  Component Value Date   HGB 12.5 (L) 04/18/2016   HCT 36.8 (L) 04/18/2016   Note: Lab results reviewed.  Recent Diagnostic Imaging Review  Dg C-arm 1-60 Min-no Report  Result Date: 10/20/2016 Fluoroscopy was utilized by the requesting physician.  No radiographic interpretation.   Note: Imaging results reviewed.          Meds  The patient has a current medication list which includes the following prescription(s): albuterol, bupropion, vitamin d3, cyanocobalamin, vitamin b-12, cyclobenzaprine, diltiazem, duloxetine, finasteride, fluticasone, fluticasone-salmeterol, furosemide, gabapentin, ipratropium, ipratropium-albuterol, lidocaine, meloxicam, metoprolol tartrate, nicotine, omeprazole, polyethylene glycol, potassium chloride sa, prednisone, simvastatin, tamsulosin, tiotropium, tramadol, trazodone, vitamin d (ergocalciferol), and zinc oxide.  Current Outpatient Prescriptions on File Prior to Visit  Medication Sig  . albuterol (PROVENTIL HFA;VENTOLIN HFA) 108 (90 Base) MCG/ACT inhaler Inhale 2 puffs into the lungs every 4 (four) hours as needed for wheezing or shortness of breath. Reported on 10/26/2015  . buPROPion (WELLBUTRIN) 75 MG tablet Take 75 mg by mouth 2 (two) times daily. Reported on 10/26/2015  . Cholecalciferol (VITAMIN D3) 2000 units capsule Take 1 capsule (2,000 Units total) by mouth daily.  . Cyanocobalamin (VITAMELTS ENERGY VITAMIN B-12) 1500 MCG TBDP Take 1 tablet by mouth daily.  . Cyanocobalamin (VITAMIN B-12) 5000 MCG LOZG Take 5,000 mcg by mouth daily.  . cyclobenzaprine (FLEXERIL) 10 MG  tablet Take 1 tablet (10 mg total) by mouth 3 (three) times daily as needed for muscle spasms.  Marland Kitchen diltiazem (CARDIZEM SR) 90 MG 12 hr capsule TAKE ONE CAPSULE BY MOUTH TWICE DAILY  . DULoxetine (CYMBALTA) 60 MG capsule Take 60 mg by mouth daily. Reported on 10/26/2015  . finasteride (PROSCAR) 5 MG tablet Take 5 mg by mouth daily.  . fluticasone (FLONASE) 50 MCG/ACT nasal spray SHAKE LIQUID AND USE 2 SPRAYS IN EACH NOSTRIL DAILY  . Fluticasone-Salmeterol (ADVAIR DISKUS) 250-50 MCG/DOSE AEPB Inhale into the lungs.  . furosemide (LASIX) 20 MG tablet Take 1 tablet (20 mg total) by mouth daily. For 1 week and then when necessary as needed for swelling and edema  . gabapentin (NEURONTIN) 800 MG tablet Take 1 tablet (800 mg total) by mouth every 8 (eight) hours.  Marland Kitchen ipratropium (ATROVENT HFA) 17 MCG/ACT inhaler Inhale 2 puffs into the lungs every 6 (six) hours.  Marland Kitchen ipratropium-albuterol (DUONEB) 0.5-2.5 (3) MG/3ML SOLN Take 3 mLs by nebulization every 6 (six) hours as needed.  . lidocaine (XYLOCAINE) 5 % ointment Apply 1 application topically See admin instructions. 2 to 4 times daily  . meloxicam (MOBIC) 15 MG tablet Take 1 tablet (15 mg total) by mouth daily.  . metoprolol tartrate (LOPRESSOR) 25 MG tablet Take 1 tablet (25 mg total) by mouth 2 (two) times daily.  . nicotine (NICODERM CQ - DOSED IN MG/24 HOURS) 21 mg/24hr patch Place 21 mg onto the skin daily.   Marland Kitchen omeprazole (PRILOSEC) 40 MG capsule Take 40 mg by mouth 2 (two) times daily.  . polyethylene glycol (MIRALAX / GLYCOLAX) packet Take 17 g by mouth daily.  . potassium chloride  SA (K-DUR,KLOR-CON) 20 MEQ tablet Take 1 tablet (20 mEq total) by mouth daily. When ever the furosemide is taken  . predniSONE (DELTASONE) 10 MG tablet Take 1 tablet by mouth once.  . simvastatin (ZOCOR) 20 MG tablet Take 20 mg by mouth at bedtime.   . tamsulosin (FLOMAX) 0.4 MG CAPS capsule Take 0.4 mg by mouth daily.  Marland Kitchen tiotropium (SPIRIVA HANDIHALER) 18 MCG inhalation  capsule Place into inhaler and inhale.  . traMADol (ULTRAM) 50 MG tablet Take 1 tablet (50 mg total) by mouth every 6 (six) hours as needed.  . traZODone (DESYREL) 50 MG tablet Take 150 mg by mouth at bedtime.  . Vitamin D, Ergocalciferol, (DRISDOL) 50000 units CAPS capsule Take 1 capsule (50,000 Units total) by mouth 2 (two) times a week x 6 weeks.  Marland Kitchen zinc oxide 20 % ointment Apply topically.   No current facility-administered medications on file prior to visit.    ROS  Constitutional: Denies any fever or chills Gastrointestinal: No reported hemesis, hematochezia, vomiting, or acute GI distress Musculoskeletal: Denies any acute onset joint swelling, redness, loss of ROM, or weakness Neurological: No reported episodes of acute onset apraxia, aphasia, dysarthria, agnosia, amnesia, paralysis, loss of coordination, or loss of consciousness  Allergies  Mr. Lovering has No Known Allergies.  [place]  Drug: Mr. Reither  reports that he uses drugs. Alcohol:  reports that he does not drink alcohol. Tobacco:  reports that he has quit smoking. His smoking use included Cigarettes. He smoked 0.50 packs per day. He uses smokeless tobacco. Medical:  has a past medical history of Anxiety; Chronic back pain; Chronic neck pain; COPD (chronic obstructive pulmonary disease) ([place]); Depression; Hyperlipidemia; Hypertension; Opiate abuse, continuous; and Pneumonia. Family: family history includes Bladder Cancer in his father.  Past Surgical History:  Procedure Laterality Date  . CHOLECYSTECTOMY    . HERNIA REPAIR     11/17  . NECK SURGERY  2002 approx   fusion, about 12 years ago  . SPINAL FUSION     ?? C3- C4   Constitutional Exam  General appearance: Well nourished, well developed, and well hydrated. In no apparent acute distress Vitals:   11/03/16 1311  BP: (!) 145/80  Pulse: 94  Resp: 16  Temp: 98 F (36.7 C)  SpO2: 97%  Weight: 187 lb (84.8 kg)  Height: '5\' 11"'$  (1.803 m)   BMI Assessment:  Estimated body mass index is 26.08 kg/m as calculated from the following:   Height as of this encounter: '5\' 11"'$  (1.803 m).   Weight as of this encounter: 187 lb (84.8 kg).  BMI interpretation table: BMI level Category Range association with higher incidence of chronic pain  <18 kg/m2 Underweight   18.5-24.9 kg/m2 Ideal body weight   25-29.9 kg/m2 Overweight Increased incidence by 20%  30-34.9 kg/m2 Obese (Class I) Increased incidence by 68%  35-39.9 kg/m2 Severe obesity (Class II) Increased incidence by 136%  >40 kg/m2 Extreme obesity (Class III) Increased incidence by 254%   BMI Readings from Last 4 Encounters:  11/03/16 26.08 kg/m  10/20/16 26.08 kg/m  10/09/16 26.08 kg/m  09/25/16 29.29 kg/m   Wt Readings from Last 4 Encounters:  11/03/16 187 lb (84.8 kg)  10/20/16 187 lb (84.8 kg)  10/09/16 187 lb (84.8 kg)  09/25/16 187 lb (84.8 kg)  Psych/Mental status: Alert, oriented x 3 (person, place, & time)       Eyes: PERLA Respiratory: No evidence of acute respiratory distress  Cervical Spine Exam  Inspection: No  masses, redness, or swelling Alignment: Symmetrical Functional ROM: Unrestricted ROM Stability: No instability detected Muscle strength & Tone: Functionally intact Sensory: Unimpaired Palpation: No palpable anomalies  Upper Extremity (UE) Exam    Side: Right upper extremity  Side: Left upper extremity  Inspection: No masses, redness, swelling, or asymmetry. No contractures  Inspection: No masses, redness, swelling, or asymmetry. No contractures  Functional ROM: Unrestricted ROM          Functional ROM: Unrestricted ROM          Muscle strength & Tone: Functionally intact  Muscle strength & Tone: Functionally intact  Sensory: Unimpaired  Sensory: Unimpaired  Palpation: No palpable anomalies  Palpation: No palpable anomalies  Specialized Test(s): Deferred         Specialized Test(s): Deferred          Thoracic Spine Exam  Inspection: No masses, redness, or  swelling Alignment: Symmetrical Functional ROM: Unrestricted ROM Stability: No instability detected Sensory: Unimpaired Muscle strength & Tone: No palpable anomalies  Lumbar Spine Exam  Inspection: No masses, redness, or swelling Alignment: Symmetrical Functional ROM: Improved after treatment Stability: No instability detected Muscle strength & Tone: Functionally intact Sensory: Movement-associated pain Palpation: Complains of area being tender to palpation Provocative Tests: Lumbar Hyperextension and rotation test: Positive bilaterally for facet joint pain. Patrick's Maneuver: Positive for bilateral S-I joint pain           Gait & Posture Assessment  Ambulation: Patient ambulates using a cane Gait: Very limited, using assistive device to ambulate Posture: Antalgic   Lower Extremity Exam    Side: Right lower extremity  Side: Left lower extremity  Inspection: No masses, redness, swelling, or asymmetry. No contractures  Inspection: No masses, redness, swelling, or asymmetry. No contractures  Functional ROM: Unrestricted ROM          Functional ROM: Unrestricted ROM          Muscle strength & Tone: Functionally intact  Muscle strength & Tone: Functionally intact  Sensory: Unimpaired  Sensory: Unimpaired  Palpation: No palpable anomalies  Palpation: No palpable anomalies   Assessment  Primary Diagnosis & Pertinent Problem List: The primary encounter diagnosis was Chronic low back pain (Location of Primary Source of Pain) (Bilateral) (R>L). Diagnoses of Lumbar facet syndrome (Bilateral) (R>L), Sacroiliac joint pain (Bilateral) (R>L), Chronic pain syndrome, Intercostal neuropathic pain (Left), and Post-thoracotomy pain syndrome (Left) were also pertinent to this visit.  Status Diagnosis  Improved Improving Improving 1. Chronic low back pain (Location of Primary Source of Pain) (Bilateral) (R>L)   2. Lumbar facet syndrome (Bilateral) (R>L)   3. Sacroiliac joint pain (Bilateral)  (R>L)   4. Chronic pain syndrome   5. Intercostal neuropathic pain (Left)   6. Post-thoracotomy pain syndrome (Left)      Plan of Care  Pharmacotherapy (Medications Ordered): No orders of the defined types were placed in this encounter.  New Prescriptions   No medications on file   Medications administered today: Mr. Fitzgibbon had no medications administered during this visit. Lab-work, procedure(s), and/or referral(s): Orders Placed This Encounter  Procedures  . LUMBAR FACET(MEDIAL BRANCH NERVE BLOCK) MBNB  . SACROILIAC JOINT INJECTINS  . INTERCOSTAL NERVE BLOCK  . Ambulatory referral to Physical Therapy   Imaging and/or referral(s): AMB REFERRAL TO PHYSICAL THERAPY  Interventional therapies: Planned, scheduled, and/or pending:   Diagnostic Left T11 & T12 intercostal nerve block under fluoro and IV sedation.   Considering:   Diagnostic Left T11 & T12 intercostal nerve block Possible left T11  and T12 intercostal RFA Diagnostic bilateral lumbar facet block + bilateral S-I Block #2 Possible bilateral lumbar facet block + bilateral S-I RFA Diagnostic right-sided lumbar epidural steroid injection  Diagnostic bilateral cervical facet block  Diagnostic right-sided cervical epidural steroid injection  Diagnostic bilateral intra-articular shoulder joint injections  Diagnostic bilateral suprascapular nerve blocks    Palliative PRN treatment(s):   Palliative bilateral lumbar facet block #2 + bilateral S-I block #2.   Provider-requested follow-up: Return for procedure (ASAP): Left intercostal nerve block, in addition, (PRN) procedure, keep scheduled Med-Mgmt appointment.  Future Appointments Date Time Provider [place]  11/04/2016 12:15 PM Alfonzo Feller, RN [facility] None   Primary Care Physician: Stanley Drought, MD Location: [facility] Note by: Stanley Rios. Stanley Rios, M.D, DABA, DABAPM, DABPM, DABIPP, FIPP Date: 11/03/2016; Time: 4:41  PM  Pain Score Disclaimer: We use the NRS-11 scale. This is a self-reported, subjective measurement of pain severity with only modest accuracy. It is used primarily to identify changes within a particular patient. It must be understood that outpatient pain scales are significantly less accurate that those used for research, where they can be applied under ideal controlled circumstances with minimal exposure to variables. In reality, the score is likely to be a combination of pain intensity and pain affect, where pain affect describes the degree of emotional arousal or changes in action readiness caused by the sensory experience of pain. Factors such as social and work situation, setting, emotional state, anxiety levels, expectation, and prior pain experience may influence pain perception and show large inter-individual differences that may also be affected by time variables.  Patient instructions provided during this appointment: Patient Instructions  Preparing for Procedure with Sedation Instructions: . Oral Intake: Do not eat or drink anything for at least 8 hours prior to your procedure. . Transportation: Public transportation is not allowed. Bring an adult driver. The driver must be physically present in our waiting room before any procedure can be started. Marland Kitchen Physical Assistance: Bring an adult physically capable of assisting you, in the event you need help. This adult should keep you company at home for at least 6 hours after the procedure. . Blood Pressure Medicine: Take your blood pressure medicine with a sip of water the morning of the procedure. . Blood thinners:  . Diabetics on insulin: Notify the staff so that you can be scheduled 1st case in the morning. If your diabetes requires high dose insulin, take only  of your normal insulin dose the morning of the procedure and notify the staff that you have done so. . Preventing infections: Shower with an antibacterial soap the morning of your  procedure. . Build-up your immune system: Take 1000 mg of Vitamin C with every meal (3 times a day) the day prior to your procedure. Marland Kitchen Antibiotics: Inform the staff if you have a condition or reason that requires you to take antibiotics before dental procedures. . Pregnancy: If you are pregnant, call and cancel the procedure. . Sickness: If you have a cold, fever, or any active infections, call and cancel the procedure. . Arrival: You must be in the facility at least 30 minutes prior to your scheduled procedure. . Children: Do not bring children with you. . Dress appropriately: Bring dark clothing that you would not mind if they get stained. . Valuables: Do not bring any jewelry or valuables. Procedure appointments are reserved for interventional treatments only. Marland Kitchen No Prescription Refills. . No medication changes will be discussed during procedure appointments. Marland Kitchen  No disability issues will be discussed.  ____________________________________________________________________________________________  Preparing for Procedure with Sedation Instructions: . Oral Intake: Do not eat or drink anything for at least 8 hours prior to your procedure. . Transportation: Public transportation is not allowed. Bring an adult driver. The driver must be physically present in our waiting room before any procedure can be started. Marland Kitchen Physical Assistance: Bring an adult capable of physically assisting you, in the event you need help. . Blood Pressure Medicine: Take your blood pressure medicine with a sip of water the morning of the procedure. . Insulin: Take only  of your normal insulin dose. . Preventing infections: Shower with an antibacterial soap the morning of your procedure. . Build-up your immune system: Take 1000 mg of Vitamin C with every meal (3 times a day) the day prior to your procedure. . Pregnancy: If you are pregnant, call and cancel the procedure. . Sickness: If you have a cold, fever, or any active  infections, call and cancel the procedure. . Arrival: You must be in the facility at least 30 minutes prior to your scheduled procedure. . Children: Do not bring children with you. . Dress appropriately: Bring dark clothing that you would not mind if they get stained. . Valuables: Do not bring any jewelry or valuables. Procedure appointments are reserved for interventional treatments only. Marland Kitchen No Prescription Refills. . No medication changes will be discussed during procedure appointments. No disability issues will be discussed.Intercostal Nerve Block Patient Information  Description: The twelve intercostal nerves arise from the first thru twelfth thoracic nerve roots.  The nerve begins at the spine and wraps around the body, lying in a groove underneath each rib.  Each intercostal nerve innervates a specific strip of skin and body walk of the abdomen and chest.  Therefore, injuries of the chest wall or abdominal wall result in pain that is transmitted back to the brian via the intercostal nerves.  Examples of such injuries include rib fractures and incisions for lung and gall bladder surgery.  Occasionally, pain may persist long after an injury or surgical incision secondary to inflammation and irritation of the intercostal nerve.  The longstanding pain is known as intercostal neuralgia.  An intercostal nerve block is preformed to eliminate pain either temporarily or permanently.  A small needle is placed below the rib and local anesthetic (like Novocaine) and possibly steroid is injected.  Usually 2-4 intercostal nerves are blocked at a time depending on the problem.  The patient will experience a slight "pin-prick" sensation for each injection.  Shortly thereafter, the strip of skin that is innervated by the blocked intercostal nerve will feel numb.  Persistent pain that is only temporarily relieved with local anesthetic may require a more permanent block. This procedure is called Cryoneurolysis and  entails placing a small probe beneath the rib to freeze the nerve.  Conditions that may be treated by intercostal nerve blocks:   Rib fractures  Longstanding pain from surgery of the chest or abdomen (intercostal neuralgia)  Pain from chest tubes  Pain from trauma to the chest  Preparation for the injections:  1. Do not eat any solid food or dairy products within 8 hours of your appointment. 2. You may drink clear liquids up to 3 hours before appointment.  Clear liquids include water, black coffee, juice or soda.  No milk or cream please. 3. You may take your regular medication, including pain medications, with a sip of water before your appointment.  Diabetics should hold regular insulin (  if take separately) and take 1/2 normal NPH dose the morning of the procedure.   Carry some sugar containing items with you to your appointment. 4. A driver must accompany you and be prepared to drive you home after your procedure. 5. Bring all your current medications with you. 6. An IV may be inserted and sedation may be given at the discretion of the physician. 7. A blood pressure cuff, EKG and other monitors will often be applied during the procedure.  Some patients may need to have extra oxygen administered for a short period. 8. You will be asked to provide medical information, including your allergies, prior to the procedure.  We must know immediately if you are taking blood thinners (like Coumadin/Warfarin) or if you are allergic to IV iodine contrast (dye). We must know if you could possible be pregnant.  Possible side-effects:   Bleeding from needle site  Infection (rare)  Nerve injury (rare)  Numbness & tingling of skin  Collapsed lung requiring chest tube (rare)  Local anesthetic toxicity (rare)  Light-headedness (temporary)  Pain at injection site (several days)  Decreased blood pressure (temporary)  Shortness of breath  Jittery/shaking sensation (temporary)  Call if  you experience:   Difficulty breathing or hives (go directly to the emergency room)  Redness, inflammation or drainage at the injection site  Severe pain at the site of the injection  Any new symptoms which are concerning   Please note:  Your pain may subside immediately but may return several hours after the injection.  Often, more than one injection is required to reduce the pain. Also, if several temporary blocks with local anesthetic are ineffective, a more permanent block with cryolysis may be necessary.  This will be discussed with you should this be the case.  If you have any questions, please call [phone] [facility]

## 2016-11-03 NOTE — Progress Notes (Signed)
Safety precautions to be maintained throughout the outpatient stay will include: orient to surroundings, keep bed in low position, maintain call bell within reach at all times, provide assistance with transfer out of bed and ambulation.  

## 2016-11-03 NOTE — Patient Instructions (Addendum)
____________________________________________________________________________________________  Preparing for Procedure with Sedation Instructions: . Oral Intake: Do not eat or drink anything for at least 8 hours prior to your procedure. . Transportation: Public transportation is not allowed. Bring an adult driver. The driver must be physically present in our waiting room before any procedure can be started. . Physical Assistance: Bring an adult physically capable of assisting you, in the event you need help. This adult should keep you company at home for at least 6 hours after the procedure. . Blood Pressure Medicine: Take your blood pressure medicine with a sip of water the morning of the procedure. . Blood thinners:  . Diabetics on insulin: Notify the staff so that you can be scheduled 1st case in the morning. If your diabetes requires high dose insulin, take only  of your normal insulin dose the morning of the procedure and notify the staff that you have done so. . Preventing infections: Shower with an antibacterial soap the morning of your procedure. . Build-up your immune system: Take 1000 mg of Vitamin C with every meal (3 times a day) the day prior to your procedure. . Antibiotics: Inform the staff if you have a condition or reason that requires you to take antibiotics before dental procedures. . Pregnancy: If you are pregnant, call and cancel the procedure. . Sickness: If you have a cold, fever, or any active infections, call and cancel the procedure. . Arrival: You must be in the facility at least 30 minutes prior to your scheduled procedure. . Children: Do not bring children with you. . Dress appropriately: Bring dark clothing that you would not mind if they get stained. . Valuables: Do not bring any jewelry or valuables. Procedure appointments are reserved for interventional treatments only. . No Prescription Refills. . No medication changes will be discussed during procedure  appointments. . No disability issues will be discussed. ______________________________________________________________________________________________  Preparing for Procedure with Sedation Instructions: . Oral Intake: Do not eat or drink anything for at least 8 hours prior to your procedure. . Transportation: Public transportation is not allowed. Bring an adult driver. The driver must be physically present in our waiting room before any procedure can be started. . Physical Assistance: Bring an adult capable of physically assisting you, in the event you need help. . Blood Pressure Medicine: Take your blood pressure medicine with a sip of water the morning of the procedure. . Insulin: Take only  of your normal insulin dose. . Preventing infections: Shower with an antibacterial soap the morning of your procedure. . Build-up your immune system: Take 1000 mg of Vitamin C with every meal (3 times a day) the day prior to your procedure. . Pregnancy: If you are pregnant, call and cancel the procedure. . Sickness: If you have a cold, fever, or any active infections, call and cancel the procedure. . Arrival: You must be in the facility at least 30 minutes prior to your scheduled procedure. . Children: Do not bring children with you. . Dress appropriately: Bring dark clothing that you would not mind if they get stained. . Valuables: Do not bring any jewelry or valuables. Procedure appointments are reserved for interventional treatments only. . No Prescription Refills. . No medication changes will be discussed during procedure appointments. No disability issues will be discussed.Intercostal Nerve Block Patient Information  Description: The twelve intercostal nerves arise from the first thru twelfth thoracic nerve roots.  The nerve begins at the spine and wraps around the body, lying in a groove underneath each rib.    Each intercostal nerve innervates a specific strip of skin and body walk of the  abdomen and chest.  Therefore, injuries of the chest wall or abdominal wall result in pain that is transmitted back to the brian via the intercostal nerves.  Examples of such injuries include rib fractures and incisions for lung and gall bladder surgery.  Occasionally, pain may persist long after an injury or surgical incision secondary to inflammation and irritation of the intercostal nerve.  The longstanding pain is known as intercostal neuralgia.  An intercostal nerve block is preformed to eliminate pain either temporarily or permanently.  A small needle is placed below the rib and local anesthetic (like Novocaine) and possibly steroid is injected.  Usually 2-4 intercostal nerves are blocked at a time depending on the problem.  The patient will experience a slight "pin-prick" sensation for each injection.  Shortly thereafter, the strip of skin that is innervated by the blocked intercostal nerve will feel numb.  Persistent pain that is only temporarily relieved with local anesthetic may require a more permanent block. This procedure is called Cryoneurolysis and entails placing a small probe beneath the rib to freeze the nerve.  Conditions that may be treated by intercostal nerve blocks:   Rib fractures  Longstanding pain from surgery of the chest or abdomen (intercostal neuralgia)  Pain from chest tubes  Pain from trauma to the chest  Preparation for the injections:  1. Do not eat any solid food or dairy products within 8 hours of your appointment. 2. You may drink clear liquids up to 3 hours before appointment.  Clear liquids include water, black coffee, juice or soda.  No milk or cream please. 3. You may take your regular medication, including pain medications, with a sip of water before your appointment.  Diabetics should hold regular insulin (if take separately) and take 1/2 normal NPH dose the morning of the procedure.   Carry some sugar containing items with you to your appointment. 4. A  driver must accompany you and be prepared to drive you home after your procedure. 5. Bring all your current medications with you. 6. An IV may be inserted and sedation may be given at the discretion of the physician. 7. A blood pressure cuff, EKG and other monitors will often be applied during the procedure.  Some patients may need to have extra oxygen administered for a short period. 8. You will be asked to provide medical information, including your allergies, prior to the procedure.  We must know immediately if you are taking blood thinners (like Coumadin/Warfarin) or if you are allergic to IV iodine contrast (dye). We must know if you could possible be pregnant.  Possible side-effects:   Bleeding from needle site  Infection (rare)  Nerve injury (rare)  Numbness & tingling of skin  Collapsed lung requiring chest tube (rare)  Local anesthetic toxicity (rare)  Light-headedness (temporary)  Pain at injection site (several days)  Decreased blood pressure (temporary)  Shortness of breath  Jittery/shaking sensation (temporary)  Call if you experience:   Difficulty breathing or hives (go directly to the emergency room)  Redness, inflammation or drainage at the injection site  Severe pain at the site of the injection  Any new symptoms which are concerning   Please note:  Your pain may subside immediately but may return several hours after the injection.  Often, more than one injection is required to reduce the pain. Also, if several temporary blocks with local anesthetic are ineffective, a more permanent   block with cryolysis may be necessary.  This will be discussed with you should this be the case.  If you have any questions, please call [phone] [facility]     

## 2016-11-03 NOTE — Patient Outreach (Signed)
[facility]) Care Management  11/03/2016  Stanley Rios. Apr 17, 1956 [id number]   Conference call with patient and [facility] to schedule an appointment with the social worker there for a Ambulance person.  Patient will need to bring the referral and picture identification with her to the appointment on Friday, 11/07/16 at 11:30 am.  This social worker will drop off referral on 11/04/16.    [name] [facility] Care Management [phone]

## 2016-11-04 ENCOUNTER — Other Ambulatory Visit: Payer: Self-pay | Admitting: *Deleted

## 2016-11-04 ENCOUNTER — Encounter: Payer: Self-pay | Admitting: *Deleted

## 2016-11-04 NOTE — Patient Outreach (Addendum)
[facility]) Care Management   11/04/2016  Stanley Rios. [birth year] [id number]  Stanley Rios. is an 61 y.o. male  Subjective:  Patient reports feeling pretty good on today. Patient discussed that he has improvement in back pain after recent nerve block procedure at pain clinic.  Patient discussed he is interested in the pulmonary rehab . Patient and wife discussed patient possibly may receive a nerve block  to help get relief of discomfort at left side area near previous hernia surgery repair, but that he may have to do physical therapy prior to this, patient states he doesn't feel like he can do therapy but he will try.   Patient reports he wears his oxygen at night at 3 liters and not during the day. Patient discussed he is doing his duo nebulizer treatments as recommended.        Objective:  BP 112/70 (BP Location: Left Arm, Patient Position: Sitting, Cuff Size: Large)   Pulse 86   Resp 18   SpO2 96%  Patient ambulating independently in home, gait steady, oxygen saturation 96% on room air after ambulating .  Review of Systems  Constitutional: Negative.   HENT: Negative.   Eyes: Negative.   Respiratory: Negative for sputum production.   Cardiovascular: Negative.   Genitourinary: Negative.   Musculoskeletal: Positive for back pain.  Skin: Negative.   Neurological: Negative.   Endo/Heme/Allergies: Negative.   Psychiatric/Behavioral: Negative.     Physical Exam  Constitutional: He is oriented to person, place, and time. He appears well-developed and well-nourished.  Cardiovascular: Normal rate, regular rhythm and normal heart sounds.   Respiratory: Effort normal. He has decreased breath sounds in the left lower field. He has no wheezes.  GI: Soft.  Neurological: He is alert and oriented to person, place, and time.  Skin: Skin is warm and dry.  Psychiatric: He has a normal mood and affect. His behavior is normal. Judgment and thought content  normal.    Encounter Medications:   Outpatient Encounter Prescriptions as of 11/04/2016  Medication Sig Note  . albuterol (PROVENTIL HFA;VENTOLIN HFA) 108 (90 Base) MCG/ACT inhaler Inhale 2 puffs into the lungs every 4 (four) hours as needed for wheezing or shortness of breath. Reported on 10/26/2015   . buPROPion (WELLBUTRIN) 75 MG tablet Take 75 mg by mouth 2 (two) times daily. Reported on 10/26/2015   . Cholecalciferol (VITAMIN D3) 2000 units capsule Take 1 capsule (2,000 Units total) by mouth daily.   . Cyanocobalamin (VITAMELTS ENERGY VITAMIN B-12) 1500 MCG TBDP Take 1 tablet by mouth daily.   . Cyanocobalamin (VITAMIN B-12) 5000 MCG LOZG Take 5,000 mcg by mouth daily.   . cyclobenzaprine (FLEXERIL) 10 MG tablet Take 1 tablet (10 mg total) by mouth 3 (three) times daily as needed for muscle spasms.   Marland Kitchen diltiazem (CARDIZEM SR) 90 MG 12 hr capsule TAKE ONE CAPSULE BY MOUTH TWICE DAILY   . DULoxetine (CYMBALTA) 60 MG capsule Take 60 mg by mouth daily. Reported on 10/26/2015   . finasteride (PROSCAR) 5 MG tablet Take 5 mg by mouth daily.   . fluticasone (FLONASE) 50 MCG/ACT nasal spray SHAKE LIQUID AND USE 2 SPRAYS IN EACH NOSTRIL DAILY   . Fluticasone-Salmeterol (ADVAIR DISKUS) 250-50 MCG/DOSE AEPB Inhale into the lungs.   . furosemide (LASIX) 20 MG tablet Take 1 tablet (20 mg total) by mouth daily. For 1 week and then when necessary as needed for swelling and edema   . gabapentin (NEURONTIN) 800  MG tablet Take 1 tablet (800 mg total) by mouth every 8 (eight) hours.   Marland Kitchen ipratropium (ATROVENT HFA) 17 MCG/ACT inhaler Inhale 2 puffs into the lungs every 6 (six) hours.   Marland Kitchen ipratropium-albuterol (DUONEB) 0.5-2.5 (3) MG/3ML SOLN Take 3 mLs by nebulization every 6 (six) hours as needed.   . lidocaine (XYLOCAINE) 5 % ointment Apply 1 application topically See admin instructions. 2 to 4 times daily   . meloxicam (MOBIC) 15 MG tablet Take 1 tablet (15 mg total) by mouth daily.   . metoprolol tartrate  (LOPRESSOR) 25 MG tablet Take 1 tablet (25 mg total) by mouth 2 (two) times daily.   . nicotine (NICODERM CQ - DOSED IN MG/24 HOURS) 21 mg/24hr patch Place 21 mg onto the skin daily.    Marland Kitchen omeprazole (PRILOSEC) 40 MG capsule Take 40 mg by mouth 2 (two) times daily.   . polyethylene glycol (MIRALAX / GLYCOLAX) packet Take 17 g by mouth daily.   . potassium chloride SA (K-DUR,KLOR-CON) 20 MEQ tablet Take 1 tablet (20 mEq total) by mouth daily. When ever the furosemide is taken   . predniSONE (DELTASONE) 10 MG tablet Take 1 tablet by mouth once.   . simvastatin (ZOCOR) 20 MG tablet Take 20 mg by mouth at bedtime.    . tamsulosin (FLOMAX) 0.4 MG CAPS capsule Take 0.4 mg by mouth daily.   Marland Kitchen tiotropium (SPIRIVA HANDIHALER) 18 MCG inhalation capsule Place into inhaler and inhale.   . traMADol (ULTRAM) 50 MG tablet Take 1 tablet (50 mg total) by mouth every 6 (six) hours as needed. 10/09/2016: DO NOT DELETE, even if Expired!!! See Dr. Adalberto Cole care coordination note.  . traZODone (DESYREL) 50 MG tablet Take 150 mg by mouth at bedtime.   . Vitamin D, Ergocalciferol, (DRISDOL) 50000 units CAPS capsule Take 1 capsule (50,000 Units total) by mouth 2 (two) times a week x 6 weeks.   Marland Kitchen zinc oxide 20 % ointment Apply topically.    No facility-administered encounter medications on file as of 11/04/2016.     Functional Status:   In your present state of health, do you have any difficulty performing the following activities: 08/25/2016 07/29/2016  Hearing? N N  Vision? N N  Difficulty concentrating or making decisions? N N  Walking or climbing stairs? Y Y  Dressing or bathing? Y Y  Doing errands, shopping? [name]  Preparing Food and eating ? Y N  Using the Toilet? N N  In the past six months, have you accidently leaked urine? N N  Do you have problems with loss of bowel control? N N  Managing your Medications? Y Y  Managing your Finances? Y N  Housekeeping or managing your Housekeeping? [name]  Some recent data  might be hidden    Fall/Depression Screening:    PHQ 2/9 Scores 11/03/2016 10/09/2016 09/25/2016 08/25/2016 08/05/2016 07/29/2016 05/01/2016  PHQ - 2 Score 0 0 0 1 0 1 2  PHQ- 9 Score - - - - - - 3    Assessment:    COPD-  Reports usual breathing , green zone. Completing duo nebs as recommended. Has referral for pulmonary rehab from [facility], need follow up on referral .Patient prefers to attend in [place]. Patient has planned sleep study in May to evaluate for CPAP need.  Patient reports he does not smoke.   Chronic Pain- continuing follow up at clinic. Reports improved back pain 2/10 today, increased mobility and movement . New referral for physical therapy patient  willing to try.   Hypertension patient monitors blood pressures at least 3 times a week at home , readings in 110- 130/range,patient reports blood increases when pain .  Patient will benefit from reinforcement of heart healthy diet wife requesting information . Patient needs assistance for obtaining scales at home .   Plan: Will place care coordination call to [facility] regarding referral for pulmonary rehab, spoke with Stanton Kidney to notify patient prefers to attend in [place], provided fax number.  Mrs.Sylvestre has now provided me with a  form she received at [place] at previous visit  that reads Ambulatory referral to Pulmonary Rehab with provider signature, will fax form to [facility] rehab program per patient request.   Will continue to follow for COPD education and management and care coordination needs. Will follow up by phone in the next 2 weeks.  Will review [facility] resources for obtaining patient scales for monitoring weights at home . Patient will attend all scheduled visits.  Will send visit note to PCP as quarterly update     [facility] CM Care Plan Problem One     Most Recent Value  Care Plan Problem One  Knowledge deficit related COPD education and management   Role Documenting the Problem One  Care Management Coordinator   Care Plan for Problem One  Active  THN Long Term Goal (31-90 days)  Patient will report increased knowledge of COPD self care managment  in the next 60  days   THN Long Term Goal Start Date  10/06/16  Interventions for Problem One Long Term Goal  Encouraged regarding importance of taking medications , respiratory treaments as prescribed.   THN CM Short Term Goal #1 (0-30 days)  Patient will report using incentive spirometry at least 3 times a day   THN CM Short Term Goal #1 Start Date  10/06/16  [facility] CM Short Term Goal #1 Met Date  11/04/16  THN CM Short Term Goal #2 (0-30 days)  Patient will report walking at least at 5 minutes twice daily in the next 30 days   THN CM Short Term Goal #2 Start Date  10/06/16  Interventions for Short Term Goal #2  Encouraged to continue increased activity as tolerated   THN CM Short Term Goal #3 (0-30 days)  Patient will report knowledge of food choices for  balanced diet in the next 30 days   THN CM Short Term Goal #3 Start Date  10/06/16  Interventions for Short Tern Goal #3  Will send patient EMMI information on heart healthy diet .   THN CM Short Term Goal #4 (0-30 days)  Patient will be able to state action plan for worsening symptoms of COPD in the next 30 days,.  THN CM Short Term Goal #4 Start Date  10/06/16  Interventions for Short Term Goal #4  Review  COPD zone information on magnet       [name], RN, [place] Management [phone]- Mobile [phone]- [place] Office

## 2016-11-05 ENCOUNTER — Other Ambulatory Visit: Payer: Self-pay | Admitting: *Deleted

## 2016-11-05 NOTE — Patient Outreach (Addendum)
[facility]) Care Management  11/05/2016  Breccan Galant. 1956/05/30 [id number]   Brief visit to patient's home to provide referral to the [facility] so that patient can get a clothing voucher for clothes.  Appointment with the [employer] to obtain the voucher scheduled for 11/07/16.  Patient provided with a shower chair to help avoid falls.  Grab bar installation in patient's bathroom also arranged through [employer].  Patient provided with a referral to the [employer] for a Ambulance person.  Patient verbalized having no additional community resource needs.   Patient 's case to be closed to social work at this time. RNCM and patient's provider's office to be notified of case closure.   [name] [facility] Care Management [phone]

## 2016-11-07 DIAGNOSIS — J449 Chronic obstructive pulmonary disease, unspecified: Secondary | ICD-10-CM | POA: Diagnosis not present

## 2016-11-07 DIAGNOSIS — K458 Other specified abdominal hernia without obstruction or gangrene: Secondary | ICD-10-CM | POA: Diagnosis not present

## 2016-11-07 DIAGNOSIS — J189 Pneumonia, unspecified organism: Secondary | ICD-10-CM | POA: Diagnosis not present

## 2016-11-13 ENCOUNTER — Ambulatory Visit: Payer: PPO | Attending: Pain Medicine | Admitting: Physical Therapy

## 2016-11-13 DIAGNOSIS — M2560 Stiffness of unspecified joint, not elsewhere classified: Secondary | ICD-10-CM | POA: Diagnosis not present

## 2016-11-13 DIAGNOSIS — R269 Unspecified abnormalities of gait and mobility: Secondary | ICD-10-CM | POA: Diagnosis not present

## 2016-11-13 DIAGNOSIS — R293 Abnormal posture: Secondary | ICD-10-CM | POA: Diagnosis not present

## 2016-11-13 DIAGNOSIS — M5442 Lumbago with sciatica, left side: Secondary | ICD-10-CM | POA: Diagnosis not present

## 2016-11-13 DIAGNOSIS — M6281 Muscle weakness (generalized): Secondary | ICD-10-CM | POA: Insufficient documentation

## 2016-11-13 DIAGNOSIS — M5441 Lumbago with sciatica, right side: Secondary | ICD-10-CM | POA: Diagnosis not present

## 2016-11-13 DIAGNOSIS — G8929 Other chronic pain: Secondary | ICD-10-CM

## 2016-11-14 ENCOUNTER — Encounter: Payer: Self-pay | Admitting: Physical Therapy

## 2016-11-14 NOTE — Therapy (Addendum)
[facility] [address]. [place], [place], [address] Phone: [phone]   Fax:  [phone]  Physical Therapy Evaluation  Patient Details  Name: Stanley Rios. MRN: [id number] Date of Birth: 1956/04/29 Referring Provider: Dr. Dossie Arbour  Encounter Date: 11/13/2016    1 of 8 treatment sessions  Past Medical History:  Diagnosis Date  . Anxiety   . Chronic back pain   . Chronic neck pain   . COPD (chronic obstructive pulmonary disease) ([place])   . Depression   . Hyperlipidemia   . Hypertension   . Opiate abuse, continuous   . Pneumonia     Past Surgical History:  Procedure Laterality Date  . CHOLECYSTECTOMY    . HERNIA REPAIR     11/17  . NECK SURGERY  2002 approx   fusion, about 12 years ago  . SPINAL FUSION     ?? C3- C4    There were no vitals filed for this visit.         [facility] PT Assessment - 11/19/16 0001      Assessment   Medical Diagnosis Chronic low back pain   Referring Provider Dr. Dossie Arbour   Onset Date/Surgical Date 07/28/16   Next MD Visit --  11/19/16     Balance Screen   Has the patient fallen in the past 6 months No       Pt. reports chronic h/o back pain.  Pt. currently states low back pain is a 5/10 prior to PT tx and 3/10 at best in sitting posture.  Pt. reports no falls and uses rollator for balance and to carry portable O2 canister.  Pt. uses 3-3.5L of O2 during the day and is hoping to participate with Pulmonary Rehab (waiting to hear back).      See HEP    Pt. is a 61 y/o male is chronic h/o back pain.  Pt. has multiple comorbidities and arrived to PT with use of 3 L of portable O2 and rollator for balance.  Pt. currently being seen by pain mgmt. for low back pain and hoping to get insurance authorization for RF oblation procedure to manage pain symptoms to improve overall mobility.  Pt. presents with 75% limitation in lumbar flexion/ extension/ rotn. (pain limited/ significant joint  stiffness).  L sided low thoracic/ lumbar muscle tenderness and difficulty tolerating prone position due to breathing issues/ back pain.  SLR 60 deg. with (+) pain.  Pt. has difficulty with tandem stance/ gait and SLS due to LE muscle weakness.  No issues with EC static standing and Oz sat remains >94% sat rate t/o tx.  Pt. will benefit from skilled PT services to increase thoracic/lumbar mobility and LE strength to improve pain-free mobility/ referral to Pulmonary Rehab.          PT Long Term Goals - 11/19/16 6962      PT LONG TERM GOAL #1   Title Pt. I with HEP to increase lumbar AROM to >50% all planes with no increase c/o pain to improve daily mobility/ household tasks.    Baseline 75% lumbar limitation all planes of mvmt. with moderate pain/ significant joint stiffness.    Time 4   Period Weeks   Status New     PT LONG TERM GOAL #2   Title Pt. will report no tenderness over L low thoracic/lumbar musculature to improve pain-free mobility.     Baseline >5/10 LBP with walking/ standing tasks.    Time 4  Period Weeks   Status New     PT LONG TERM GOAL #3   Title Pt. able to completea 45 minutes exercise program with no increase c/o back pain to promote referral to Pulmonary Rehab at [facility].   Baseline Moderate c/o back pain/ poor overall endurance   Time 4   Period Weeks   Status New         Patient will benefit from skilled therapeutic intervention in order to improve the following deficits and impairments:  Abnormal gait, Improper body mechanics, Pain, Postural dysfunction, Hypomobility, Decreased strength, Decreased range of motion, Decreased mobility, Decreased endurance, Decreased activity tolerance, Decreased balance, Difficulty walking  Visit Diagnosis: Chronic bilateral low back pain with bilateral sciatica  Joint stiffness  Muscle weakness (generalized)  Abnormal posture  Gait difficulty     Problem List Patient Active Problem List   Diagnosis Date Noted   . Intercostal neuropathic pain (Left) 11/03/2016  . Post-thoracotomy pain syndrome (Left) 11/03/2016  . Chronic hypoxemic respiratory failure ([place]) 10/06/2016  . Chronic narcotic use 10/06/2016  . B12 deficiency 09/25/2016  . Osteoarthritis 09/25/2016  . Neurogenic pain 09/25/2016  . Musculoskeletal pain 09/25/2016  . Diaphragmatic hernia 08/29/2016  . Chronic neck pain (Location of Secondary source of pain) (Bilateral) (R>L) 08/09/2016  . Chronic lower extremity pain (Bilateral) (R>L) 08/09/2016  . Long term current use of opiate analgesic 08/05/2016  . Opiate use 08/05/2016  . Long term prescription opiate use 08/05/2016  . Chronic hip pain 08/05/2016  . Chronic shoulder pain (Location of Tertiary source of pain) (Bilateral) (R>L) 08/05/2016  . Lower extremity weakness 08/05/2016  . Pressure injury of skin 04/15/2016  . DDD (degenerative disc disease), lumbar 03/27/2016  . Lumbar facet syndrome (Bilateral) (R>L) 03/27/2016  . Sacroiliac joint pain (Bilateral) (R>L) 03/27/2016  . DDD (degenerative disc disease), cervical 03/27/2016  . Cervical facet syndrome ([place]) 03/27/2016  . Opiate withdrawal ([place]) 02/09/2016  . COPD (chronic obstructive pulmonary disease) ([place]) 02/09/2016  . HTN (hypertension) 02/09/2016  . Depression 02/09/2016  . Anxiety 02/09/2016  . Neutrophilic leukocytosis 72/62/0355  . Testosterone deficiency 08/16/2015  . Vitamin D deficiency 08/16/2015  . Seborrheic keratoses 05/16/2015  . Pulmonary emphysema ([place]) 05/02/2015  . Dry eye syndrome 06/14/2014  . Posterior subcapsular cataract, bilateral 06/14/2014  . Vitreomacular adhesion of right eye 06/14/2014  . Irritable bowel syndrome (IBS) 05/03/2014  . Hypertension 02/28/2014  . Chronic low back pain (Location of Primary Source of Pain) (Bilateral) (R>L) 02/01/2013  . Cervical post-laminectomy syndrome 02/01/2013  . Depressive disorder 08/06/2011  . Insomnia 08/06/2011  . Chronic pain syndrome 05/14/2011   . Spinal stenosis in cervical region 05/14/2011  . Umbilical hernia 97/41/6384  . Esophageal reflux 03/25/2011  . Tobacco use disorder 01/09/2011  . Chronic airway obstruction ([place]) 10/08/2010   Pura Spice, PT, DPT # [phone] 11/19/2016, 7:22 AM  [facility] [address] [place], [place], [address] Phone: [phone]   Fax:  [phone]  Name: [name] MRN: [id number] Date of Birth: [birth year]

## 2016-11-18 ENCOUNTER — Other Ambulatory Visit: Payer: Self-pay | Admitting: *Deleted

## 2016-11-18 ENCOUNTER — Ambulatory Visit: Payer: PPO | Admitting: Physical Therapy

## 2016-11-18 DIAGNOSIS — R293 Abnormal posture: Secondary | ICD-10-CM

## 2016-11-18 DIAGNOSIS — M5442 Lumbago with sciatica, left side: Secondary | ICD-10-CM | POA: Diagnosis not present

## 2016-11-18 DIAGNOSIS — G8929 Other chronic pain: Secondary | ICD-10-CM

## 2016-11-18 DIAGNOSIS — M2560 Stiffness of unspecified joint, not elsewhere classified: Secondary | ICD-10-CM

## 2016-11-18 DIAGNOSIS — M6281 Muscle weakness (generalized): Secondary | ICD-10-CM

## 2016-11-18 DIAGNOSIS — M5441 Lumbago with sciatica, right side: Principal | ICD-10-CM

## 2016-11-18 DIAGNOSIS — R269 Unspecified abnormalities of gait and mobility: Secondary | ICD-10-CM

## 2016-11-18 NOTE — Patient Outreach (Signed)
[facility]) Care Management  2020/04/2417  Stanley Rios. 29-Jun-1956 [id number]   Telephone follow up call   Placed call to patient, he discussed being sore related to his recent physical therapy visit and exercises he has been prescribed to do at home. Patient reports he has physical therapy appointment on today, he discussed he is not sure if he will be able to continue with sessions due to increased discomfort with stretching exercises. Patient reports he as appointment with pain clinic on tomorrow and to receive nerve block injection.   Mrs.Minich discussed that they have not heard anything regarding Pulmonary rehab referral.  Patient discussed breathing is about the same, no increased shortness of breath, does get winded when doing exercises. Patient discussed wanting to try the pulmonary rehab program,but unsure if he will be able to do that also     Plan Placed call to Pulmonary Rehab to follow up regarding whether they have received faxed referral form  Review and update patient centered goals.  When plan follow up call to patient in the next 2 weeks .  [name], RN, [place] Management [phone]- Mobile [phone]- Toll Free Main Office

## 2016-11-19 ENCOUNTER — Encounter: Payer: Self-pay | Admitting: Pain Medicine

## 2016-11-19 ENCOUNTER — Ambulatory Visit
Admission: RE | Admit: 2016-11-19 | Discharge: 2016-11-19 | Disposition: A | Discharge status: Home / Self Care | Payer: PPO | Source: Ambulatory Visit | Attending: Pain Medicine | Admitting: Pain Medicine

## 2016-11-19 ENCOUNTER — Ambulatory Visit (HOSPITAL_BASED_OUTPATIENT_CLINIC_OR_DEPARTMENT_OTHER): Payer: PPO | Admitting: Pain Medicine

## 2016-11-19 ENCOUNTER — Encounter: Payer: Self-pay | Admitting: Physical Therapy

## 2016-11-19 VITALS — BP 121/82 | HR 80 | Temp 97.2°F | Resp 16 | Ht 71.0 in | Wt 187.0 lb

## 2016-11-19 DIAGNOSIS — M545 Low back pain: Secondary | ICD-10-CM | POA: Diagnosis not present

## 2016-11-19 DIAGNOSIS — G580 Intercostal neuropathy: Secondary | ICD-10-CM | POA: Diagnosis not present

## 2016-11-19 DIAGNOSIS — G548 Other nerve root and plexus disorders: Secondary | ICD-10-CM

## 2016-11-19 DIAGNOSIS — G588 Other specified mononeuropathies: Secondary | ICD-10-CM

## 2016-11-19 DIAGNOSIS — Z981 Arthrodesis status: Secondary | ICD-10-CM | POA: Insufficient documentation

## 2016-11-19 DIAGNOSIS — J449 Chronic obstructive pulmonary disease, unspecified: Secondary | ICD-10-CM | POA: Insufficient documentation

## 2016-11-19 DIAGNOSIS — G8912 Acute post-thoracotomy pain: Secondary | ICD-10-CM | POA: Insufficient documentation

## 2016-11-19 MED ORDER — METHYLPREDNISOLONE ACETATE 80 MG/ML IJ SUSP
80.0000 mg | Freq: Once | INTRAMUSCULAR | Status: AC
  Administered 2016-11-19: 80 mg
  Filled 2016-11-19: qty 1

## 2016-11-19 MED ORDER — ROPIVACAINE HCL 2 MG/ML IJ SOLN
9.0000 mL | Freq: Once | INTRAMUSCULAR | Status: AC
  Administered 2016-11-19: 9 mL
  Filled 2016-11-19: qty 10

## 2016-11-19 MED ORDER — LIDOCAINE HCL (PF) 1 % IJ SOLN
10.0000 mL | Freq: Once | INTRAMUSCULAR | Status: AC
Start: 2016-11-19 — End: 2016-11-19
  Administered 2016-11-19: 5 mL
  Filled 2016-11-19: qty 10

## 2016-11-19 MED ORDER — LACTATED RINGERS IV SOLN
1000.0000 mL | Freq: Once | INTRAVENOUS | Status: AC
  Administered 2016-11-19: 1000 mL via INTRAVENOUS

## 2016-11-19 MED ORDER — MIDAZOLAM HCL 5 MG/5ML IJ SOLN
1.0000 mg | INTRAMUSCULAR | Status: DC | PRN
  Administered 2016-11-19: 2 mg via INTRAVENOUS
  Filled 2016-11-19: qty 5

## 2016-11-19 MED ORDER — FENTANYL CITRATE (PF) 100 MCG/2ML IJ SOLN
25.0000 ug | INTRAMUSCULAR | Status: DC | PRN
  Administered 2016-11-19: 50 ug via INTRAVENOUS
  Filled 2016-11-19: qty 2

## 2016-11-19 NOTE — Progress Notes (Signed)
Patient's Name: Stanley Rios.  MRN: [id number]  Referring Provider: Milinda Pointer, MD  DOB: September 16, 1955  PCP: Stanley Drought, MD  DOS: 11/19/2016  Note by: Stanley Rios. Dossie Arbour, MD  Service setting: Ambulatory outpatient  Location: [facility] (AMB) Pain Management Facility  Visit type: Procedure  Specialty: Interventional Pain Management  Patient type: Established   Primary Reason for Visit: Interventional Pain Management Treatment. CC: Back Pain (left, lower)  Procedure:  Anesthesia, Analgesia, Anxiolysis:  Type: Diagnostic    Intercostal  Nerve Block Region: Posterolateral Thoracic Area Level: T12, T11, T10, & T9 Ribs Laterality: Left-Sided Position: Prone  Type: Local Anesthesia with Moderate (Conscious) Sedation Local Anesthetic: Lidocaine 1% Route: Intravenous (IV) IV Access: Secured Sedation: Meaningful verbal contact was maintained at all times during the procedure  Indication(s): Analgesia and Anxiety  Indications: 1. Intercostal neuropathic pain (Left)   2. Post-thoracotomy pain syndrome (Left)   3. Intercostal neuralgia   4. Chronic obstructive pulmonary disease, unspecified COPD type ([place])    Pain Score: Pre-procedure: 3 /10 Post-procedure: 0-No pain/10  Pre-op Assessment:  Previous date of service: 11/03/16 Service provided: Evaluation [name] is a 61 y.o. (year old), male patient, seen today for interventional treatment. He  has a past surgical history that includes Cholecystectomy; Spinal fusion; Neck surgery (2002 approx); and Hernia repair. His primarily concern today is the Back Pain (left, lower)  Initial Vital Signs: Blood pressure 112/65, pulse 96, temperature 98.1 F (36.7 C), temperature source Oral, resp. rate 18, height '5\' 11"'$  (1.803 m), weight 187 lb (84.8 kg), SpO2 99 %. BMI: 26.08 kg/m  Risk Assessment: Allergies: Reviewed. He has No Known Allergies.  Allergy Precautions: None required Coagulopathies: "Reviewed. None identified.    Blood-thinner therapy: None at this time Active Infection(s): Reviewed. None identified. [name] is afebrile  Site Confirmation: [name] was asked to confirm the procedure and laterality before marking the site Procedure checklist: Completed Consent: Before the procedure and under the influence of no sedative(s), amnesic(s), or anxiolytics, the patient was informed of the treatment options, risks and possible complications. To fulfill our ethical and legal obligations, as recommended by the American Medical Association's Code of Ethics, I have informed the patient of my clinical impression; the nature and purpose of the treatment or procedure; the risks, benefits, and possible complications of the intervention; the alternatives, including doing nothing; the risk(s) and benefit(s) of the alternative treatment(s) or procedure(s); and the risk(s) and benefit(s) of doing nothing. The patient was provided information about the general risks and possible complications associated with the procedure. These may include, but are not limited to: failure to achieve desired goals, infection, bleeding, organ or nerve damage, allergic reactions, paralysis, and death. In addition, the patient was informed of those risks and complications associated to the procedure, such as failure to decrease pain; infection; bleeding; organ or nerve damage with subsequent damage to sensory, motor, and/or autonomic systems, resulting in permanent pain, numbness, and/or weakness of one or several areas of the body; allergic reactions; (i.e.: anaphylactic reaction); and/or death. Furthermore, the patient was informed of those risks and complications associated with the medications. These include, but are not limited to: allergic reactions (i.e.: anaphylactic or anaphylactoid reaction(s)); adrenal axis suppression; blood sugar elevation that in diabetics may result in ketoacidosis or comma; water retention that in patients with  history of congestive heart failure may result in shortness of breath, pulmonary edema, and decompensation with resultant heart failure; weight gain; swelling or edema; medication-induced neural toxicity; particulate matter embolism  and blood vessel occlusion with resultant organ, and/or nervous system infarction; and/or aseptic necrosis of one or more joints. Finally, the patient was informed that Medicine is not an exact science; therefore, there is also the possibility of unforeseen or unpredictable risks and/or possible complications that may result in a catastrophic outcome. The patient indicated having understood very clearly. We have given the patient no guarantees and we have made no promises. Enough time was given to the patient to ask questions, all of which were answered to the patient's satisfaction. [name] has indicated that he wanted to continue with the procedure. Attestation: I, the ordering provider, attest that I have discussed with the patient the benefits, risks, side-effects, alternatives, likelihood of achieving goals, and potential problems during recovery for the procedure that I have provided informed consent. Date: 11/19/2016; Time: 10:08 AM  Pre-Procedure Preparation:  Monitoring: As per clinic protocol. Respiration, ETCO2, SpO2, BP, heart rate and rhythm monitor placed and checked for adequate function Safety Precautions: Patient was assessed for positional comfort and pressure points before starting the procedure. Time-out: I initiated and conducted the "Time-out" before starting the procedure, as per protocol. The patient was asked to participate by confirming the accuracy of the "Time Out" information. Verification of the correct person, site, and procedure were performed and confirmed by me, the nursing staff, and the patient. "Time-out" conducted as per Joint Commission's Universal Protocol (UP.01.01.01). "Time-out" Date & Time: 11/19/2016; 1032 hrs.  Description of  Procedure Process:   Target Area: The sub-costal neurovascular bundle area Approach: Sub-costal approach Area Prepped: Entire Mid-Axillary Thoracic Region Prepping solution: ChloraPrep (2% chlorhexidine gluconate and 70% isopropyl alcohol) Safety Precautions: Aspiration looking for blood return was conducted prior to all injections. At no point did we inject any substances, as a needle was being advanced. No attempts were made at seeking any paresthesias. Safe injection practices and needle disposal techniques used. Medications properly checked for expiration dates. SDV (single dose vial) medications used. Description of the Procedure: Protocol guidelines were followed. The patient was placed in position over the procedure table. The target area was identified and the area prepped in the usual manner. Skin & deeper tissues infiltrated with local anesthetic. After cleaning the skin with an antiseptic solution, 1-2 mL of dilute local anesthetic was infiltrated subcutaneously at the planned injection site. The fingers of the palpating hand were used to straddle the insertion site at the inferior border of the rib and fix the skin to avoid unwanted skin movement. Appropriate amount of time allowed to pass for local anesthetics to take effect. The procedure needles were then advanced to the target area at an angle of approximately 20 cephalad to the skin. Contact with the rib was made. While maintaining the same angle of insertion, the needle was walked off the inferior border of the rib as the skin was allowed to return to its initial position. Then the needle was advanced no more than 3 mm below the inferior margin of the rib. Proper needle placement was secured. Negative aspiration confirmed. Following negative aspiration for blood or air, 3-5 mL of local anesthetic was injected. The solution injected in intermittent fashion, asking for systemic symptoms every 0.5cc of injectate. The needle was then removed and  the area cleansed, making sure to leave some of the prepping solution back to take advantage of its long term bactericidal properties. Vitals:   11/19/16 1040 11/19/16 1049 11/19/16 1059 11/19/16 1109  BP: (!) 129/91 114/80 121/73 121/82  Pulse:  89 87  80  Resp: '13 16 14 16  '$ Temp:    97.2 F (36.2 C)  TempSrc:    Tympanic  SpO2: 95% 92% 96% 96%  Weight:      Height:        Start Time: 1032 hrs. End Time:   hrs.  Materials:  Needle(s) Type: Regular needle Gauge: 22G Length: 3.5-in Medication(s): We administered fentaNYL, lactated ringers, midazolam, methylPREDNISolone acetate, lidocaine (PF), and ropivacaine (PF) 2 mg/mL (0.2%). Please see chart orders for dosing details.  Imaging Guidance (Non-Spinal):  Type of Imaging Technique: Fluoroscopy Guidance (Non-Spinal) Indication(s): Assistance in needle guidance and placement for procedures requiring needle placement in or near specific anatomical locations not easily accessible without such assistance. Exposure Time: Please see nurses notes. Contrast: Before injecting any contrast, we confirmed that the patient did not have an allergy to iodine, shellfish, or radiological contrast. Once satisfactory needle placement was completed at the desired level, radiological contrast was injected. Contrast injected under live fluoroscopy. No contrast complications. See chart for type and volume of contrast used. Fluoroscopic Guidance: I was personally present during the use of fluoroscopy. "Tunnel Vision Technique" used to obtain the best possible view of the target area. Parallax error corrected before commencing the procedure. "Direction-depth-direction" technique used to introduce the needle under continuous pulsed fluoroscopy. Once target was reached, antero-posterior, oblique, and lateral fluoroscopic projection used confirm needle placement in all planes. Images permanently stored in EMR. Interpretation: I personally interpreted the imaging  intraoperatively. Adequate needle placement confirmed in multiple planes. Appropriate spread of contrast into desired area was observed. No evidence of afferent or efferent intravascular uptake. Permanent images saved into the patient's record.  Antibiotic Prophylaxis:  Indication(s): None identified Antibiotic given: None  Post-operative Assessment:  EBL: None Complications: No immediate post-treatment complications observed by team, or reported by patient. Note: The patient tolerated the entire procedure well. A repeat set of vitals were taken after the procedure and the patient was kept under observation following institutional policy, for this type of procedure. Post-procedural neurological assessment was performed, showing return to baseline, prior to discharge. The patient was provided with post-procedure discharge instructions, including a section on how to identify potential problems. Should any problems arise concerning this procedure, the patient was given instructions to immediately contact us, at any time, without hesitation. In any case, we plan to contact the patient by telephone for a follow-up status report regarding this interventional procedure. Comments:  No additional relevant information.  Plan of Care  Disposition: Discharge home  Discharge Date & Time: 11/19/2016; 1110 hrs.  Physician-requested Follow-up:  Return for Post-Procedure Eval, (2 wks), w/ MD.  Future Appointments Date Time Provider Rachel  11/25/2016 1:00 PM Pura Spice, PT [facility] None  12/02/2016 12:00 PM Alfonzo Feller, RN [facility] None  12/17/2016 1:30 PM Stanley Pointer, MD [facility] None   Medications ordered for procedure: Meds ordered this encounter  Medications  . fentaNYL (SUBLIMAZE) injection 25-50 mcg    Make sure Narcan is available in the pyxis when using this medication. In the event of respiratory depression (RR< 8/min): Titrate NARCAN (naloxone) in increments of 0.1 to  0.2 mg IV at 2-3 minute intervals, until desired degree of reversal.  . lactated ringers infusion 1,000 mL  . midazolam (VERSED) 5 MG/5ML injection 1-2 mg    Make sure Flumazenil is available in the pyxis when using this medication. If oversedation occurs, administer 0.2 mg IV over 15 sec. If after 45 sec no response, administer 0.2 mg  again over 1 min; may repeat at 1 min intervals; not to exceed 4 doses (1 mg)  . methylPREDNISolone acetate (DEPO-MEDROL) injection 80 mg  . lidocaine (PF) (XYLOCAINE) 1 % injection 10 mL  . ropivacaine (PF) 2 mg/mL (0.2%) (NAROPIN) injection 9 mL   Medications administered: We administered fentaNYL, lactated ringers, midazolam, methylPREDNISolone acetate, lidocaine (PF), and ropivacaine (PF) 2 mg/mL (0.2%).  See the medical record for exact dosing, route, and time of administration.  Lab-work, Procedure(s), & Referral(s) Ordered: Orders Placed This Encounter  Procedures  . DG C-Arm 1-60 Min-No Report  . Informed Consent Details: Transcribe to consent form and obtain patient signature  . Provider attestation of informed consent for procedure/surgical case  . Verify informed consent  . Discharge instructions  . Follow-up  . Oxygen therapy Mode or (Route): Nasal cannula; Liters Per Minute: 2; Keep 02 saturation: Above 92%   Imaging Ordered: Results for orders placed in visit on 10/20/16  DG C-Arm 1-60 Min-No Report   Narrative Fluoroscopy was utilized by the requesting physician.  No radiographic  interpretation.    New Prescriptions   No medications on file   Primary Care Physician: Stanley Drought, MD Location: [facility] Note by: Stanley Rios. Dossie Arbour, M.D, DABA, DABAPM, DABPM, DABIPP, FIPP Date: 11/19/2016; Time: 1:10 PM  Disclaimer:  Medicine is not an exact science. The only guarantee in medicine is that nothing is guaranteed. It is important to note that the decision to proceed with this intervention was based on  the information collected from the patient. The Data and conclusions were drawn from the patient's questionnaire, the interview, and the physical examination. Because the information was provided in large part by the patient, it cannot be guaranteed that it has not been purposely or unconsciously manipulated. Every effort has been made to obtain as much relevant data as possible for this evaluation. It is important to note that the conclusions that lead to this procedure are derived in large part from the available data. Always take into account that the treatment will also be dependent on availability of resources and existing treatment guidelines, considered by other Pain Management Practitioners as being common knowledge and practice, at the time of the intervention. For Medico-Legal purposes, it is also important to point out that variation in procedural techniques and pharmacological choices are the acceptable norm. The indications, contraindications, technique, and results of the above procedure should only be interpreted and judged by a Board-Certified Interventional Pain Specialist with extensive familiarity and expertise in the same exact procedure and technique.  Instructions provided at this appointment: Patient Instructions  Post-Procedure instructions Instructions:  Apply ice: Fill a plastic sandwich bag with crushed ice. Cover it with a small towel and apply to injection site. Apply for 15 minutes then remove x 15 minutes. Repeat sequence on day of procedure, until you go to bed. The purpose is to minimize swelling and discomfort after procedure.  Apply heat: Apply heat to procedure site starting the day following the procedure. The purpose is to treat any soreness and discomfort from the procedure.  Food intake: Start with clear liquids (like water) and advance to regular food, as tolerated.   Physical activities: Keep activities to a minimum for the first 8 hours after the procedure.    Driving: If you have received any sedation, you are not allowed to drive for 24 hours after your procedure.  Blood thinner: Restart your blood thinner 6 hours after your procedure. (Only for those taking blood  thinners)  Insulin: As soon as you can eat, you may resume your normal dosing schedule. (Only for those taking insulin)  Infection prevention: Keep procedure site clean and dry.  Post-procedure Pain Diary: Extremely important that this be done correctly and accurately. Recorded information will be used to determine the next step in treatment.  Pain evaluated is that of treated area only. Do not include pain from an untreated area.  Complete every hour, on the hour, for the initial 8 hours. Set an alarm to help you do this part accurately.  Do not go to sleep and have it completed later. It will not be accurate.  Follow-up appointment: Keep your follow-up appointment after the procedure. Usually 2 weeks for most procedures. (6 weeks in the case of radiofrequency.) Bring you pain diary.  Expect:  From numbing medicine (AKA: Local Anesthetics): Numbness or decrease in pain.  Onset: Full effect within 15 minutes of injected.  Duration: It will depend on the type of local anesthetic used. On the average, 1 to 8 hours.   From steroids: Decrease in swelling or inflammation. Once inflammation is improved, relief of the pain will follow.  Onset of benefits: Depends on the amount of swelling present. The more swelling, the longer it will take for the benefits to be seen.   Duration: Steroids will stay in the system x 2 weeks. Duration of benefits will depend on multiple posibilities including persistent irritating factors.  From procedure: Some discomfort is to be expected once the numbing medicine wears off. This should be minimal if ice and heat are applied as instructed. Call if:  You experience numbness and weakness that gets worse with time, as opposed to wearing off.  New  onset bowel or bladder incontinence. (Spinal procedures only)  Emergency Numbers:  [facility] business hours (Monday - Thursday, 8:00 AM - 4:00 PM) (Friday, 9:00 AM - 12:00 Noon): [phone]  After hours: [phone] _____________________________________________________________________________________________

## 2016-11-19 NOTE — Addendum Note (Signed)
Addended by: Pura Spice on: 11/19/2016 07:25 AM   Modules accepted: Orders

## 2016-11-19 NOTE — Patient Instructions (Signed)
Post-Procedure instructions Instructions:  Apply ice: Fill a plastic sandwich bag with crushed ice. Cover it with a small towel and apply to injection site. Apply for 15 minutes then remove x 15 minutes. Repeat sequence on day of procedure, until you go to bed. The purpose is to minimize swelling and discomfort after procedure.  Apply heat: Apply heat to procedure site starting the day following the procedure. The purpose is to treat any soreness and discomfort from the procedure.  Food intake: Start with clear liquids (like water) and advance to regular food, as tolerated.   Physical activities: Keep activities to a minimum for the first 8 hours after the procedure.   Driving: If you have received any sedation, you are not allowed to drive for 24 hours after your procedure.  Blood thinner: Restart your blood thinner 6 hours after your procedure. (Only for those taking blood thinners)  Insulin: As soon as you can eat, you may resume your normal dosing schedule. (Only for those taking insulin)  Infection prevention: Keep procedure site clean and dry.  Post-procedure Pain Diary: Extremely important that this be done correctly and accurately. Recorded information will be used to determine the next step in treatment.  Pain evaluated is that of treated area only. Do not include pain from an untreated area.  Complete every hour, on the hour, for the initial 8 hours. Set an alarm to help you do this part accurately.  Do not go to sleep and have it completed later. It will not be accurate.  Follow-up appointment: Keep your follow-up appointment after the procedure. Usually 2 weeks for most procedures. (6 weeks in the case of radiofrequency.) Bring you pain diary.  Expect:  From numbing medicine (AKA: Local Anesthetics): Numbness or decrease in pain.  Onset: Full effect within 15 minutes of injected.  Duration: It will depend on the type of local anesthetic used. On the average, 1 to 8  hours.   From steroids: Decrease in swelling or inflammation. Once inflammation is improved, relief of the pain will follow.  Onset of benefits: Depends on the amount of swelling present. The more swelling, the longer it will take for the benefits to be seen.   Duration: Steroids will stay in the system x 2 weeks. Duration of benefits will depend on multiple posibilities including persistent irritating factors.  From procedure: Some discomfort is to be expected once the numbing medicine wears off. This should be minimal if ice and heat are applied as instructed. Call if:  You experience numbness and weakness that gets worse with time, as opposed to wearing off.  New onset bowel or bladder incontinence. (Spinal procedures only)  Emergency Numbers:  [facility] business hours (Monday - Thursday, 8:00 AM - 4:00 PM) (Friday, 9:00 AM - 12:00 Noon): [phone]  After hours: [phone]   __________________________________________________________________________________________    

## 2016-11-19 NOTE — Therapy (Signed)
[facility] [address] [place], [place], [address] Phone: [phone]   Fax:  [phone]  Physical Therapy Treatment  Patient Details  Name: Stanley Rios. MRN: [id number] Date of Birth: 03/04/1956 Referring Provider: Dr. Dossie Arbour  Encounter Date: 07/03/2017      PT End of Session - 11/19/16 1812    Visit Number 2   Number of Visits 8   Date for PT Re-Evaluation 12/11/16   Authorization - Visit Number 2   Authorization - Number of Visits 10   PT Start Time 1350   PT Stop Time 1441   PT Time Calculation (min) 51 min   Equipment Utilized During Treatment Oxygen   Activity Tolerance Patient tolerated treatment well;Patient limited by pain   Behavior During Therapy [facility] for tasks assessed/performed      Past Medical History:  Diagnosis Date  . Anxiety   . Chronic back pain   . Chronic neck pain   . COPD (chronic obstructive pulmonary disease) ([place])   . Depression   . Hyperlipidemia   . Hypertension   . Opiate abuse, continuous   . Pneumonia     Past Surgical History:  Procedure Laterality Date  . CHOLECYSTECTOMY    . HERNIA REPAIR     11/17  . NECK SURGERY  2002 approx   fusion, about 12 years ago  . SPINAL FUSION     ?? C3- C4    There were no vitals filed for this visit.      Subjective Assessment - 11/19/16 1810    Subjective Pt. states he has been having difficulty with HEP/ stretches due to low back pain.  Pt. scheduled to return to MD tomorrow for injection.     Patient is accompained by: Family member   Pertinent History pt. has received cortisone injections in past with benefit and hoping to get authorization for RF oblation procedure for low back pain control to promote greater independence.     Limitations Sitting;Lifting;House hold activities;Walking   Patient Stated Goals Decrease LBP/ increase LE muscle strength.     Currently in Pain? Yes   Pain Score 6    Pain Location Back   Pain  Orientation Lower            [facility] PT Assessment - 11/19/16 0001      Assessment   Medical Diagnosis Chronic low back pain   Referring Provider Dr. Dossie Arbour   Onset Date/Surgical Date 07/28/16   Next MD Visit --  11/19/16     Balance Screen   Has the patient fallen in the past 6 months No       OBJECTIVE:  Therex.:  Reviewed HEP (slight modification with B knee to chest).  Pt. Instructed to avoid any pain provoking stretches.  Nustep L5-6 12 min. B UE/LE (no increase c/o LBP)- checked O2 sat every 4 min. (>94% without use of O2).  Manual tx.:  Supine LE/lumbar stretches (generalized)- short static holds as tolerated.  Seated thoracic/lumbar STM at end of tx.      Pt response for medical necessity:  Benefits from LE/lumbar stretching ex. To promote increase mobility and progress towards strengthening/ walking ex. Program.  No increase c/o LBP at end of tx.         PT Long Term Goals - 11/19/16 9476      PT LONG TERM GOAL #1   Title Pt. I with HEP to increase lumbar AROM to >50% all planes  with no increase c/o pain to improve daily mobility/ household tasks.    Baseline 75% lumbar limitation all planes of mvmt. with moderate pain/ significant joint stiffness.    Time 4   Period Weeks   Status New     PT LONG TERM GOAL #2   Title Pt. will report no tenderness over L low thoracic/lumbar musculature to improve pain-free mobility.     Baseline >5/10 LBP with walking/ standing tasks.    Time 4   Period Weeks   Status New     PT LONG TERM GOAL #3   Title Pt. able to completea 45 minutes exercise program with no increase c/o back pain to promote referral to Pulmonary Rehab at [facility].   Baseline Moderate c/o back pain/ poor overall endurance   Time 4   Period Weeks   Status New            Plan - 11/19/16 1813    Clinical Impression Statement Pt. limited by low back pain with basic supine core stability ex./ pelvic tilts/ marching.  PT encouraged pt. to continue with HEP/  stretches as tolerated at home (no increase pain with stretching).  Pt. maintained >94% O2 sat t/o tx. session without use of portable O2.  Good endurance with use of Nustep at end of tx. with no increase c/o LBP and no rest breaks.     Rehab Potential Fair   PT Frequency 2x / week   PT Duration 4 weeks   PT Treatment/Interventions ADLs/Self Care Home Management;Cryotherapy;Electrical Stimulation;Moist Heat;Balance training;Therapeutic exercise;Therapeutic activities;Stair training;Gait training;Neuromuscular re-education;Patient/family education;Manual techniques;Passive range of motion   PT Next Visit Plan Discuss MD f/u and injection/  Prone mob assessment of thoracic/lumbar spine.    PT Home Exercise Plan See handouts.    Consulted and Agree with Plan of Care Patient;Family member/caregiver      Patient will benefit from skilled therapeutic intervention in order to improve the following deficits and impairments:  Abnormal gait, Improper body mechanics, Pain, Postural dysfunction, Hypomobility, Decreased strength, Decreased range of motion, Decreased mobility, Decreased endurance, Decreased activity tolerance, Decreased balance, Difficulty walking  Visit Diagnosis: Chronic bilateral low back pain with bilateral sciatica  Joint stiffness  Muscle weakness (generalized)  Abnormal posture  Gait difficulty     Problem List Patient Active Problem List   Diagnosis Date Noted  . Intercostal neuralgia (Left) 11/19/2016  . Intercostal neuropathic pain (Left) 11/03/2016  . Post-thoracotomy pain syndrome (Left) 11/03/2016  . Chronic hypoxemic respiratory failure ([place]) 10/06/2016  . Chronic narcotic use 10/06/2016  . B12 deficiency 09/25/2016  . Osteoarthritis 09/25/2016  . Neurogenic pain 09/25/2016  . Musculoskeletal pain 09/25/2016  . Diaphragmatic hernia 08/29/2016  . Chronic neck pain (Location of Secondary source of pain) (Bilateral) (R>L) 08/09/2016  . Chronic lower extremity  pain (Bilateral) (R>L) 08/09/2016  . Long term current use of opiate analgesic 08/05/2016  . Opiate use 08/05/2016  . Long term prescription opiate use 08/05/2016  . Chronic hip pain 08/05/2016  . Chronic shoulder pain (Location of Tertiary source of pain) (Bilateral) (R>L) 08/05/2016  . Lower extremity weakness 08/05/2016  . Pressure injury of skin 04/15/2016  . DDD (degenerative disc disease), lumbar 03/27/2016  . Lumbar facet syndrome (Bilateral) (R>L) 03/27/2016  . Sacroiliac joint pain (Bilateral) (R>L) 03/27/2016  . DDD (degenerative disc disease), cervical 03/27/2016  . Cervical facet syndrome ([place]) 03/27/2016  . Opiate withdrawal ([place]) 02/09/2016  . COPD (chronic obstructive pulmonary disease) ([place]) 02/09/2016  . HTN (hypertension) 02/09/2016  .  Depression 02/09/2016  . Anxiety 02/09/2016  . Neutrophilic leukocytosis 43/53/9122  . Testosterone deficiency 08/16/2015  . Vitamin D deficiency 08/16/2015  . Seborrheic keratoses 05/16/2015  . Pulmonary emphysema ([place]) 05/02/2015  . Dry eye syndrome 06/14/2014  . Posterior subcapsular cataract, bilateral 06/14/2014  . Vitreomacular adhesion of right eye 06/14/2014  . Irritable bowel syndrome (IBS) 05/03/2014  . Hypertension 02/28/2014  . Chronic low back pain (Location of Primary Source of Pain) (Bilateral) (R>L) 02/01/2013  . Cervical post-laminectomy syndrome 02/01/2013  . Depressive disorder 08/06/2011  . Insomnia 08/06/2011  . Chronic pain syndrome 05/14/2011  . Spinal stenosis in cervical region 05/14/2011  . Umbilical hernia 58/34/6219  . Esophageal reflux 03/25/2011  . Tobacco use disorder 01/09/2011  . Chronic airway obstruction ([place]) 10/08/2010   Pura Spice, PT, DPT # [phone] 11/19/2016, 6:31 PM  [facility] [address] [place], [place], [address] Phone: [phone]   Fax:  [phone]  Name: [name] MRN: [id number] Date of Birth: 1955/08/13

## 2016-11-19 NOTE — Progress Notes (Signed)
Safety precautions to be maintained throughout the outpatient stay will include: orient to surroundings, keep bed in low position, maintain call bell within reach at all times, provide assistance with transfer out of bed and ambulation.  

## 2016-11-20 ENCOUNTER — Telehealth: Payer: Self-pay

## 2016-11-20 NOTE — Telephone Encounter (Signed)
Denies any needs at this time. States everything is going well.

## 2016-11-21 ENCOUNTER — Other Ambulatory Visit: Payer: Self-pay | Admitting: *Deleted

## 2016-11-21 NOTE — Patient Outreach (Signed)
[facility]) Care Management  11/21/2016  Delaney Schnick. [birth year] [id number]   Care Coordination call  Call to [facility] to follow up on referral for Pulmonary rehab that has been faxed, representative states they have received referral form and has the signature of a PA, they need MD signature on request, and have reached out to [facility] [place] office for new order and awaiting response .     Plan  Will update Mr.Jackowski by telephone on today and provide [facility]  fax number, as patient has appointment on 5/4.  Will plan next telephone follow up in May.  [name], RN, [facility] [phone]- Mobile [phone]- Toll Free Main Office

## 2016-11-25 ENCOUNTER — Encounter: Payer: Self-pay | Admitting: Physical Therapy

## 2016-11-25 ENCOUNTER — Ambulatory Visit: Payer: PPO | Attending: Pain Medicine | Admitting: Physical Therapy

## 2016-11-25 DIAGNOSIS — R269 Unspecified abnormalities of gait and mobility: Secondary | ICD-10-CM | POA: Insufficient documentation

## 2016-11-25 DIAGNOSIS — M2560 Stiffness of unspecified joint, not elsewhere classified: Secondary | ICD-10-CM

## 2016-11-25 DIAGNOSIS — M6281 Muscle weakness (generalized): Secondary | ICD-10-CM | POA: Insufficient documentation

## 2016-11-25 DIAGNOSIS — R293 Abnormal posture: Secondary | ICD-10-CM | POA: Diagnosis not present

## 2016-11-25 DIAGNOSIS — M5442 Lumbago with sciatica, left side: Secondary | ICD-10-CM | POA: Insufficient documentation

## 2016-11-25 DIAGNOSIS — G8929 Other chronic pain: Secondary | ICD-10-CM | POA: Diagnosis not present

## 2016-11-25 DIAGNOSIS — M5441 Lumbago with sciatica, right side: Secondary | ICD-10-CM | POA: Diagnosis not present

## 2016-11-25 NOTE — Therapy (Signed)
[facility] [address] [place], [place], [address] Phone: [phone]   Fax:  [phone]  Physical Therapy Treatment  Patient Details  Name: Stanley Rios. MRN: [id number] Date of Birth: 06-07-1956 Referring Provider: Dr. Dossie Arbour  Encounter Date: 11/25/2016      PT End of Session - 11/25/16 1312    Visit Number 3   Number of Visits 8   Date for PT Re-Evaluation 12/11/16   Authorization - Visit Number 3   Authorization - Number of Visits 10   PT Start Time 0223   PT Stop Time 1414   PT Time Calculation (min) 61 min   Equipment Utilized During Treatment Oxygen   Activity Tolerance Patient tolerated treatment well;Patient limited by pain   Behavior During Therapy [facility] for tasks assessed/performed      Past Medical History:  Diagnosis Date  . Anxiety   . Chronic back pain   . Chronic neck pain   . COPD (chronic obstructive pulmonary disease) ([place])   . Depression   . Hyperlipidemia   . Hypertension   . Opiate abuse, continuous   . Pneumonia     Past Surgical History:  Procedure Laterality Date  . CHOLECYSTECTOMY    . HERNIA REPAIR     11/17  . NECK SURGERY  2002 approx   fusion, about 12 years ago  . SPINAL FUSION     ?? C3- C4    There were no vitals filed for this visit.      Subjective Assessment - 11/25/16 1312    Subjective Pt. states injection last week by Dr. Dossie Arbour only help for <1 day.  Pt. states he is having 5/10 pain in side/back currently.  Pt. entered PT without rollator and with wife carrying portable O2.     Patient is accompained by: Family member   Pertinent History pt. has received cortisone injections in past with benefit and hoping to get authorization for RF oblation procedure for low back pain control to promote greater independence.     Limitations Sitting;Lifting;House hold activities;Walking   Patient Stated Goals Decrease LBP/ increase LE muscle strength.     Currently in Pain?  Yes   Pain Score 5    Pain Location Back   Pain Orientation Lower   Pain Type Chronic pain   Pain Onset More than a month ago        OBJECTIVE:  Therex.:   Nustep L6 10 min. B UE/LE (no increase c/o LBP)- checked O2 sat every 5 min. (>94% with use of O2 today).  Tandem stance 30 sec. (SBA for safety/ cuing).  Sit to stand 10x 3x/day.  Reviewed supine/ standing LE therex. (modifications to program)- no increase c/o pain but fatigue noted.    Manual tx.:  Supine LE/lumbar stretches (generalized)- short static holds as tolerated.  Seated thoracic/lumbar STM at end of tx.                 Pt response for medical necessity:  Benefits from LE/lumbar stretching ex. To promote increase mobility and progress towards strengthening/ walking ex. Program.  No increase c/o LBP at end of tx.  Discharge from PT with instruction for pt. To start Pulmonary Rehab.          PT Long Term Goals - 11/26/16 1830      PT LONG TERM GOAL #1   Title Pt. I with HEP to increase lumbar AROM to >50% all planes with no  increase c/o pain to improve daily mobility/ household tasks.    Baseline 75% lumbar limitation all planes of mvmt. with moderate pain/ significant joint stiffness.    Time 4   Period Weeks   Status Not Met     PT LONG TERM GOAL #2   Title Pt. will report no tenderness over L low thoracic/lumbar musculature to improve pain-free mobility.     Baseline >5/10 LBP with walking/ standing tasks.    Time 4   Period Weeks   Status Not Met     PT LONG TERM GOAL #3   Baseline Moderate c/o back pain/ poor overall endurance   Time 4   Period Weeks   Status Not Met            Plan - 12/16/16 1313    Clinical Impression Statement PT reviewed HEP and made modifications with supine/ standing ex.  Pt. able to complete entire PT tx. session while maintaining O2 saturation >94%.  Several short rest breaks while discussing pain mgmt. treatments and benefit from Pulmonary Rehab.  Pt./wife want to put PT on  hold at this time until pt. is able to get back pain under control and will try to start Pulmonary Rehab soon.  PT instructed pt. on benefits of exercise for improving back pain.  Discharge from PT at this time and pt instructed to contact PT if any qeustions.     Rehab Potential Fair   PT Frequency 2x / week   PT Duration 4 weeks   PT Treatment/Interventions ADLs/Self Care Home Management;Cryotherapy;Electrical Stimulation;Moist Heat;Balance training;Therapeutic exercise;Therapeutic activities;Stair training;Gait training;Neuromuscular re-education;Patient/family education;Manual techniques;Passive range of motion   PT Next Visit Plan Pt. will contact PT after MD f/u to discuss Pulmonary rehab referral/ POC   PT Home Exercise Plan See handouts.    Consulted and Agree with Plan of Care Patient;Family member/caregiver      Patient will benefit from skilled therapeutic intervention in order to improve the following deficits and impairments:  Abnormal gait, Improper body mechanics, Pain, Postural dysfunction, Hypomobility, Decreased strength, Decreased range of motion, Decreased mobility, Decreased endurance, Decreased activity tolerance, Decreased balance, Difficulty walking  Visit Diagnosis: Chronic bilateral low back pain with bilateral sciatica  Joint stiffness  Muscle weakness (generalized)  Abnormal posture  Gait difficulty       G-Codes - 12/16/2016 1830    Functional Assessment Tool Used (Outpatient Only) Clinical impression/ MODI/ LEFS/ pain/ joint stiffness/ muscle weakness/ abnormal posture.    Functional Limitation Mobility: Walking and moving around   Mobility: Walking and Moving Around Current Status [phone]) At least 40 percent but less than 60 percent impaired, limited or restricted   Mobility: Walking and Moving Around Goal Status [phone]) At least 1 percent but less than 20 percent impaired, limited or restricted   Mobility: Walking and Moving Around Discharge Status  [phone]) At least 40 percent but less than 60 percent impaired, limited or restricted      Problem List Patient Active Problem List   Diagnosis Date Noted  . Intercostal neuralgia (Left) 11/19/2016  . Intercostal neuropathic pain (Left) 11/03/2016  . Post-thoracotomy pain syndrome (Left) 11/03/2016  . Chronic hypoxemic respiratory failure ([place]) 10/06/2016  . Chronic narcotic use 10/06/2016  . B12 deficiency 09/25/2016  . Osteoarthritis 09/25/2016  . Neurogenic pain 09/25/2016  . Musculoskeletal pain 09/25/2016  . Diaphragmatic hernia 08/29/2016  . Chronic neck pain (Location of Secondary source of pain) (Bilateral) (R>L) 08/09/2016  . Chronic lower extremity pain (Bilateral) (R>L) 08/09/2016  .  Long term current use of opiate analgesic 08/05/2016  . Opiate use 08/05/2016  . Long term prescription opiate use 08/05/2016  . Chronic hip pain 08/05/2016  . Chronic shoulder pain (Location of Tertiary source of pain) (Bilateral) (R>L) 08/05/2016  . Lower extremity weakness 08/05/2016  . Pressure injury of skin 04/15/2016  . DDD (degenerative disc disease), lumbar 03/27/2016  . Lumbar facet syndrome (Bilateral) (R>L) 03/27/2016  . Sacroiliac joint pain (Bilateral) (R>L) 03/27/2016  . DDD (degenerative disc disease), cervical 03/27/2016  . Cervical facet syndrome ([place]) 03/27/2016  . Opiate withdrawal ([place]) 02/09/2016  . COPD (chronic obstructive pulmonary disease) ([place]) 02/09/2016  . HTN (hypertension) 02/09/2016  . Depression 02/09/2016  . Anxiety 02/09/2016  . Neutrophilic leukocytosis 75/64/3329  . Testosterone deficiency 08/16/2015  . Vitamin D deficiency 08/16/2015  . Seborrheic keratoses 05/16/2015  . Pulmonary emphysema ([place]) 05/02/2015  . Dry eye syndrome 06/14/2014  . Posterior subcapsular cataract, bilateral 06/14/2014  . Vitreomacular adhesion of right eye 06/14/2014  . Irritable bowel syndrome (IBS) 05/03/2014  . Hypertension 02/28/2014  . Chronic low back pain  (Location of Primary Source of Pain) (Bilateral) (R>L) 02/01/2013  . Cervical post-laminectomy syndrome 02/01/2013  . Depressive disorder 08/06/2011  . Insomnia 08/06/2011  . Chronic pain syndrome 05/14/2011  . Spinal stenosis in cervical region 05/14/2011  . Umbilical hernia 51/88/4166  . Esophageal reflux 03/25/2011  . Tobacco use disorder 01/09/2011  . Chronic airway obstruction ([place]) 10/08/2010   Pura Spice, PT, DPT # [id number] 11/26/2016, 6:31 PM  [facility] [address] [place], [place], [address] Phone: [phone]   Fax:  [phone]  Name: Stanley Rios. MRN: [id number] Date of Birth: 15-Feb-1956

## 2016-11-28 DIAGNOSIS — I739 Peripheral vascular disease, unspecified: Secondary | ICD-10-CM | POA: Insufficient documentation

## 2016-11-28 DIAGNOSIS — J432 Centrilobular emphysema: Secondary | ICD-10-CM | POA: Diagnosis not present

## 2016-11-28 DIAGNOSIS — K440 Diaphragmatic hernia with obstruction, without gangrene: Secondary | ICD-10-CM | POA: Diagnosis not present

## 2016-11-28 DIAGNOSIS — J449 Chronic obstructive pulmonary disease, unspecified: Secondary | ICD-10-CM | POA: Diagnosis not present

## 2016-11-28 DIAGNOSIS — F17201 Nicotine dependence, unspecified, in remission: Secondary | ICD-10-CM | POA: Diagnosis not present

## 2016-12-02 ENCOUNTER — Other Ambulatory Visit: Payer: Self-pay | Admitting: *Deleted

## 2016-12-02 NOTE — Patient Outreach (Addendum)
[facility]) Care Management  12/02/2016  Earmon Sherrow. Mar 18, 1956 [id number]  Telephone follow up   Placed call to patient to follow up able to speak with his wife , Rickie Gange hipaa information verified.  Wife discussed patient recent follow up at [facility] pulmonary visit, discussed he did well with 6 minute walk test,and may not need oxygen ,  but discussed concern regarding patient lung disease and concern related to heart rate increases and patient to have follow up 2 d echo.  Patient denies any increase in COPD symptoms, has chronic cough with white sputum no increase.   Patient discussed they have not heard back regarding pulmonary rehab, yet and states patient is not going to be able to tolerate the physical therapy he was attending, but wants to pursue rehab.   Wife discussed upcoming medical visit this month with sleep study and cardiology.    Patient wife denies need for home visit at this time, just wants help coordinating getting pulmonary rehab referral in place, and assistance with getting scales to monitor patient weights.    Plan  Placed call to [facility] pulmonary rehab, states they are still waiting on MD signature on referral order only have PA signature will refax.  Placed call to [place], pulmonary office able to leave a message on nurse line, requesting Pulmonary rehab referral be sent to [facility] . Will follow up with patient in the next 3 weeks, if no new concerns will plan case closure at that time.    [name], RN, [place] Management [phone]- Mobile [phone]- Toll Free Main Office

## 2016-12-03 ENCOUNTER — Telehealth: Payer: Self-pay | Admitting: Pain Medicine

## 2016-12-03 NOTE — Telephone Encounter (Signed)
Had procedure on 11-19-16, wants to know if he can have one for his lower back around the first of June? He will have to have prior auth with his ins. Patient has follow up appt on 12-17-16. Please advise if it is ok to start prior auth

## 2016-12-04 NOTE — Telephone Encounter (Signed)
Spoke with patient to confirm where back pain is and he states that pain is in the center of lower back.  Explained that he does have a standing order for facet block and that I would send this message to [place] for PA and then she will call to schedule appt.  Patient verbalizes u/o information.

## 2016-12-07 DIAGNOSIS — J189 Pneumonia, unspecified organism: Secondary | ICD-10-CM | POA: Diagnosis not present

## 2016-12-07 DIAGNOSIS — K458 Other specified abdominal hernia without obstruction or gangrene: Secondary | ICD-10-CM | POA: Diagnosis not present

## 2016-12-07 DIAGNOSIS — J449 Chronic obstructive pulmonary disease, unspecified: Secondary | ICD-10-CM | POA: Diagnosis not present

## 2016-12-09 DIAGNOSIS — J189 Pneumonia, unspecified organism: Secondary | ICD-10-CM | POA: Diagnosis not present

## 2016-12-09 DIAGNOSIS — K458 Other specified abdominal hernia without obstruction or gangrene: Secondary | ICD-10-CM | POA: Diagnosis not present

## 2016-12-09 DIAGNOSIS — J449 Chronic obstructive pulmonary disease, unspecified: Secondary | ICD-10-CM | POA: Diagnosis not present

## 2016-12-10 NOTE — Telephone Encounter (Signed)
This is pending. I have already sent the request and am waiting on the insurance company to reply. I will call the patient when I have an authorization.

## 2016-12-11 DIAGNOSIS — J449 Chronic obstructive pulmonary disease, unspecified: Secondary | ICD-10-CM | POA: Diagnosis not present

## 2016-12-12 ENCOUNTER — Other Ambulatory Visit: Payer: Self-pay | Admitting: *Deleted

## 2016-12-12 NOTE — Patient Outreach (Signed)
[facility]) Care Management  12/12/2016  Stephfon Bovey. 20-Sep-1955 [id number]  Care coordination   Placed call to patient able to speak with his wife [name], she discussed patient visit to [facility] on yesterday and having 2 d echo done . She discussed concern regarding offices not getting prior approval from insurance company for upcoming sleep study and echo that he had on yesterday. She has spoken with insurance company and has placed calls to sleep study lab center and office where 2 d echo was done.  Wife voiced frustration in delay of  getting order for Pulmonary rehab order  sent to [facility] instead of [facility], reports she discussed this at visit yesterday and uncertain if referral from [place] can be sent to [facility] , so referral was sent to [facility] outpatient pulmonary rehab.  Placed call to [facility] pulmonary , spoke with Langley Gauss she is able to see referral to Pulmonary rehab and patient preference to attend at [facility] . She will fax order to [facility] regional rehab as well as send patient a paper copy of referral.   Placed call to patient , spoke with wife she expressed thanks.  She discussed patient continued problem  of back pain and some  shortness of breath with mobility, improves with rest , patient continues to use oxygen at 2 liters at night and as needed during the day. Denies recent need to use rescue inhaler .  Mrs.Bartram awaiting to hear back from, insurance regarding approval for sleep study, she has contact number for further correspondance.  Patient has follow up visit to get results of 2 d echo.    [name], RN, [place] Management [phone]- Mobile [phone]- [place] Will follow up care coordination call in next week regarding referral to Pulmonary rehab in [place], and  sleep study .     [name], RN, [place] Management [phone]- Mobile [phone]- Toll Free Main  Office

## 2016-12-13 DIAGNOSIS — R0902 Hypoxemia: Secondary | ICD-10-CM | POA: Diagnosis not present

## 2016-12-13 DIAGNOSIS — J449 Chronic obstructive pulmonary disease, unspecified: Secondary | ICD-10-CM | POA: Diagnosis not present

## 2016-12-17 ENCOUNTER — Ambulatory Visit: Payer: PPO | Attending: Pain Medicine | Admitting: Pain Medicine

## 2016-12-17 ENCOUNTER — Encounter: Payer: Self-pay | Admitting: Pain Medicine

## 2016-12-17 VITALS — BP 119/81 | HR 102 | Temp 97.9°F | Ht 71.0 in | Wt 190.0 lb

## 2016-12-17 DIAGNOSIS — E559 Vitamin D deficiency, unspecified: Secondary | ICD-10-CM | POA: Diagnosis not present

## 2016-12-17 DIAGNOSIS — J449 Chronic obstructive pulmonary disease, unspecified: Secondary | ICD-10-CM | POA: Insufficient documentation

## 2016-12-17 DIAGNOSIS — M479 Spondylosis, unspecified: Secondary | ICD-10-CM | POA: Insufficient documentation

## 2016-12-17 DIAGNOSIS — K589 Irritable bowel syndrome without diarrhea: Secondary | ICD-10-CM | POA: Insufficient documentation

## 2016-12-17 DIAGNOSIS — I10 Essential (primary) hypertension: Secondary | ICD-10-CM | POA: Insufficient documentation

## 2016-12-17 DIAGNOSIS — M25511 Pain in right shoulder: Secondary | ICD-10-CM | POA: Insufficient documentation

## 2016-12-17 DIAGNOSIS — M5136 Other intervertebral disc degeneration, lumbar region: Secondary | ICD-10-CM | POA: Insufficient documentation

## 2016-12-17 DIAGNOSIS — M545 Low back pain: Secondary | ICD-10-CM | POA: Insufficient documentation

## 2016-12-17 DIAGNOSIS — E538 Deficiency of other specified B group vitamins: Secondary | ICD-10-CM | POA: Insufficient documentation

## 2016-12-17 DIAGNOSIS — M961 Postlaminectomy syndrome, not elsewhere classified: Secondary | ICD-10-CM | POA: Insufficient documentation

## 2016-12-17 DIAGNOSIS — D72828 Other elevated white blood cell count: Secondary | ICD-10-CM | POA: Insufficient documentation

## 2016-12-17 DIAGNOSIS — M4802 Spinal stenosis, cervical region: Secondary | ICD-10-CM | POA: Diagnosis not present

## 2016-12-17 DIAGNOSIS — K219 Gastro-esophageal reflux disease without esophagitis: Secondary | ICD-10-CM | POA: Insufficient documentation

## 2016-12-17 DIAGNOSIS — G4700 Insomnia, unspecified: Secondary | ICD-10-CM | POA: Insufficient documentation

## 2016-12-17 DIAGNOSIS — R079 Chest pain, unspecified: Secondary | ICD-10-CM | POA: Diagnosis not present

## 2016-12-17 DIAGNOSIS — M79605 Pain in left leg: Secondary | ICD-10-CM | POA: Insufficient documentation

## 2016-12-17 DIAGNOSIS — Z79891 Long term (current) use of opiate analgesic: Secondary | ICD-10-CM | POA: Insufficient documentation

## 2016-12-17 DIAGNOSIS — G548 Other nerve root and plexus disorders: Secondary | ICD-10-CM | POA: Diagnosis not present

## 2016-12-17 DIAGNOSIS — J9611 Chronic respiratory failure with hypoxia: Secondary | ICD-10-CM | POA: Insufficient documentation

## 2016-12-17 DIAGNOSIS — M542 Cervicalgia: Secondary | ICD-10-CM | POA: Insufficient documentation

## 2016-12-17 DIAGNOSIS — K449 Diaphragmatic hernia without obstruction or gangrene: Secondary | ICD-10-CM | POA: Diagnosis not present

## 2016-12-17 DIAGNOSIS — M79604 Pain in right leg: Secondary | ICD-10-CM | POA: Insufficient documentation

## 2016-12-17 DIAGNOSIS — Z720 Tobacco use: Secondary | ICD-10-CM | POA: Insufficient documentation

## 2016-12-17 DIAGNOSIS — I739 Peripheral vascular disease, unspecified: Secondary | ICD-10-CM | POA: Insufficient documentation

## 2016-12-17 DIAGNOSIS — G588 Other specified mononeuropathies: Secondary | ICD-10-CM | POA: Diagnosis not present

## 2016-12-17 DIAGNOSIS — G894 Chronic pain syndrome: Secondary | ICD-10-CM | POA: Insufficient documentation

## 2016-12-17 DIAGNOSIS — F329 Major depressive disorder, single episode, unspecified: Secondary | ICD-10-CM | POA: Insufficient documentation

## 2016-12-17 DIAGNOSIS — K429 Umbilical hernia without obstruction or gangrene: Secondary | ICD-10-CM | POA: Insufficient documentation

## 2016-12-17 DIAGNOSIS — M533 Sacrococcygeal disorders, not elsewhere classified: Secondary | ICD-10-CM | POA: Insufficient documentation

## 2016-12-17 DIAGNOSIS — G8912 Acute post-thoracotomy pain: Secondary | ICD-10-CM

## 2016-12-17 DIAGNOSIS — L821 Other seborrheic keratosis: Secondary | ICD-10-CM | POA: Insufficient documentation

## 2016-12-17 DIAGNOSIS — G629 Polyneuropathy, unspecified: Secondary | ICD-10-CM | POA: Insufficient documentation

## 2016-12-17 DIAGNOSIS — M25512 Pain in left shoulder: Secondary | ICD-10-CM | POA: Insufficient documentation

## 2016-12-17 NOTE — Patient Instructions (Addendum)
____________________________________________________________________________________________  Preparing for Procedure with Sedation Instructions: . Oral Intake: Do not eat or drink anything for at least 8 hours prior to your procedure. . Transportation: Public transportation is not allowed. Bring an adult driver. The driver must be physically present in our waiting room before any procedure can be started. Marland Kitchen Physical Assistance: Bring an adult physically capable of assisting you, in the event you need help. This adult should keep you company at home for at least 6 hours after the procedure. . Blood Pressure Medicine: Take your blood pressure medicine with a sip of water the morning of the procedure. . Blood thinners:  . Diabetics on insulin: Notify the staff so that you can be scheduled 1st case in the morning. If your diabetes requires high dose insulin, take only  of your normal insulin dose the morning of the procedure and notify the staff that you have done so. . Preventing infections: Shower with an antibacterial soap the morning of your procedure. . Build-up your immune system: Take 1000 mg of Vitamin C with every meal (3 times a day) the day prior to your procedure. Marland Kitchen Antibiotics: Inform the staff if you have a condition or reason that requires you to take antibiotics before dental procedures. . Pregnancy: If you are pregnant, call and cancel the procedure. . Sickness: If you have a cold, fever, or any active infections, call and cancel the procedure. . Arrival: You must be in the facility at least 30 minutes prior to your scheduled procedure. . Children: Do not bring children with you. . Dress appropriately: Bring dark clothing that you would not mind if they get stained. . Valuables: Do not bring any jewelry or valuables. Procedure appointments are reserved for interventional treatments only. Marland Kitchen No Prescription Refills. . No medication changes will be discussed during procedure  appointments. . No disability issues will be discussed. ______________________________________________________________________________________________  Preparing for Procedure with Sedation Instructions: . Oral Intake: Do not eat or drink anything for at least 8 hours prior to your procedure. . Transportation: Public transportation is not allowed. Bring an adult driver. The driver must be physically present in our waiting room before any procedure can be started. Marland Kitchen Physical Assistance: Bring an adult capable of physically assisting you, in the event you need help. . Blood Pressure Medicine: Take your blood pressure medicine with a sip of water the morning of the procedure. . Insulin: Take only  of your normal insulin dose. . Preventing infections: Shower with an antibacterial soap the morning of your procedure. . Build-up your immune system: Take 1000 mg of Vitamin C with every meal (3 times a day) the day prior to your procedure. . Pregnancy: If you are pregnant, call and cancel the procedure. . Sickness: If you have a cold, fever, or any active infections, call and cancel the procedure. . Arrival: You must be in the facility at least 30 minutes prior to your scheduled procedure. . Children: Do not bring children with you. . Dress appropriately: Bring dark clothing that you would not mind if they get stained. . Valuables: Do not bring any jewelry or valuables. Procedure appointments are reserved for interventional treatments only. Marland Kitchen No Prescription Refills. . No medication changes will be discussed during procedure appointments. No disability issues will be discussed.Intercostal Nerve Block Patient Information  Description: The twelve intercostal nerves arise from the first thru twelfth thoracic nerve roots.  The nerve begins at the spine and wraps around the body, lying in a groove underneath each rib.  Each intercostal nerve innervates a specific strip of skin and body walk of the  abdomen and chest.  Therefore, injuries of the chest wall or abdominal wall result in pain that is transmitted back to the brian via the intercostal nerves.  Examples of such injuries include rib fractures and incisions for lung and gall bladder surgery.  Occasionally, pain may persist long after an injury or surgical incision secondary to inflammation and irritation of the intercostal nerve.  The longstanding pain is known as intercostal neuralgia.  An intercostal nerve block is preformed to eliminate pain either temporarily or permanently.  A small needle is placed below the rib and local anesthetic (like Novocaine) and possibly steroid is injected.  Usually 2-4 intercostal nerves are blocked at a time depending on the problem.  The patient will experience a slight "pin-prick" sensation for each injection.  Shortly thereafter, the strip of skin that is innervated by the blocked intercostal nerve will feel numb.  Persistent pain that is only temporarily relieved with local anesthetic may require a more permanent block. This procedure is called Cryoneurolysis and entails placing a small probe beneath the rib to freeze the nerve.  Conditions that may be treated by intercostal nerve blocks:   Rib fractures  Longstanding pain from surgery of the chest or abdomen (intercostal neuralgia)  Pain from chest tubes  Pain from trauma to the chest  Preparation for the injections:  1. Do not eat any solid food or dairy products within 8 hours of your appointment. 2. You may drink clear liquids up to 3 hours before appointment.  Clear liquids include water, black coffee, juice or soda.  No milk or cream please. 3. You may take your regular medication, including pain medications, with a sip of water before your appointment.  Diabetics should hold regular insulin (if take separately) and take 1/2 normal NPH dose the morning of the procedure.   Carry some sugar containing items with you to your appointment. 4. A  driver must accompany you and be prepared to drive you home after your procedure. 5. Bring all your current medications with you. 6. An IV may be inserted and sedation may be given at the discretion of the physician. 7. A blood pressure cuff, EKG and other monitors will often be applied during the procedure.  Some patients may need to have extra oxygen administered for a short period. 8. You will be asked to provide medical information, including your allergies, prior to the procedure.  We must know immediately if you are taking blood thinners (like Coumadin/Warfarin) or if you are allergic to IV iodine contrast (dye). We must know if you could possible be pregnant.  Possible side-effects:   Bleeding from needle site  Infection (rare)  Nerve injury (rare)  Numbness & tingling of skin  Collapsed lung requiring chest tube (rare)  Local anesthetic toxicity (rare)  Light-headedness (temporary)  Pain at injection site (several days)  Decreased blood pressure (temporary)  Shortness of breath  Jittery/shaking sensation (temporary)  Call if you experience:   Difficulty breathing or hives (go directly to the emergency room)  Redness, inflammation or drainage at the injection site  Severe pain at the site of the injection  Any new symptoms which are concerning   Please note:  Your pain may subside immediately but may return several hours after the injection.  Often, more than one injection is required to reduce the pain. Also, if several temporary blocks with local anesthetic are ineffective, a more permanent  block with cryolysis may be necessary.  This will be discussed with you should this be the case.  If you have any questions, please call [phone] [facility]

## 2016-12-17 NOTE — Progress Notes (Signed)
Patient's Name: Stanley Rios.  MRN: [id number]  Referring Provider: Marygrace Drought, MD  DOB: [birth year]  PCP: Marygrace Drought, MD  DOS: 12/17/2016  Note by: Kathlen Brunswick. Dossie Arbour, MD  Service setting: Ambulatory outpatient  Specialty: Interventional Pain Management  Location: [facility] (AMB) Pain Management Facility    Patient type: Established   Primary Reason(s) for Visit: Encounter for post-procedure evaluation of chronic illness with mild to moderate exacerbation CC: Chest Pain (left)  HPI  [name] is a 61 y.o. year old, male patient, who comes today for a post-procedure evaluation. He has Opiate withdrawal (HCC); COPD (chronic obstructive pulmonary disease) ([place]); HTN (hypertension); Depression; Anxiety; Neutrophilic leukocytosis; DDD (degenerative disc disease), lumbar; Lumbar facet syndrome (Bilateral) (R>L); Sacroiliac joint pain (Bilateral) (R>L); DDD (degenerative disc disease), cervical; Cervical facet syndrome ([place]); Pressure injury of skin; Chronic airway obstruction ([place]); Chronic pain syndrome; Depressive disorder; Dry eye syndrome; Esophageal reflux; Hypertension; Insomnia; Irritable bowel syndrome (IBS); Chronic low back pain (Location of Primary Source of Pain) (Bilateral) (R>L); Posterior subcapsular cataract, bilateral; Cervical post-laminectomy syndrome; Pulmonary emphysema ([place]); Seborrheic keratoses; Spinal stenosis in cervical region; Testosterone deficiency; Tobacco use disorder; Umbilical hernia; Vitamin D deficiency; Vitreomacular adhesion of right eye; Long term current use of opiate analgesic; Opiate use; Long term prescription opiate use; Chronic hip pain; Chronic shoulder pain (Location of Tertiary source of pain) (Bilateral) (R>L); Lower extremity weakness; Chronic neck pain (Location of Secondary source of pain) (Bilateral) (R>L); Chronic lower extremity pain (Bilateral) (R>L); B12 deficiency; Osteoarthritis; Neurogenic pain; Musculoskeletal pain; Diaphragmatic hernia;  Chronic hypoxemic respiratory failure ([place]); Chronic narcotic use; Intercostal neuropathic pain (Left); Post-thoracotomy pain syndrome (Left); Intercostal neuralgia (Left); and Claudication ([place]) on his problem list. His primarily concern today is the Chest Pain (left)  Pain Assessment: Self-Reported Pain Score: 2 /10             Reported level is compatible with observation.       Pain Type: Chronic pain Pain Location: Rib cage Pain Orientation: Left Pain Descriptors / Indicators: Nagging Pain Frequency: Constant  [name] comes in today for post-procedure evaluation after the treatment done on 12/03/2016. According to the patient, they intercostal nerve block significantly improved his left sided thoracic pain. In fact, he attained 100% relief of the posterior thoracic pain and that pain has not returned. However he still has intercostal pain that starts in his flank area and goes into the anterior portion of his chest. There is a good possibility that this may be secondary to pain that may be controlled by the lateral cutaneous branches of the dorsal rami. In any case, we'll bring the patient back for a repeat procedure where I may also block the dorsal rami.  Further details on both, my assessment(s), as well as the proposed treatment plan, please see below.  Post-Procedure Assessment  12/03/2016 Procedure: Diagnostic left T12, T11, T10, & T9 intercostal nerve block under fluoroscopic guidance and IV sedation. Pre-procedure pain score:  3/10 Post-procedure pain score: 0/10 (100% relief) Influential Factors: BMI: 26.50 kg/m Intra-procedural challenges: None observed Assessment challenges: None detected         Post-procedural side-effects, adverse reactions, or complications: None reported Reported issues: None  Sedation: Sedation provided. When no sedatives are used, the analgesic levels obtained are directly associated to the effectiveness of the local anesthetics. However, when  sedation is provided, the level of analgesia obtained during the initial 1 hour following the intervention, is believed to be the result of a combination of factors. These  factors may include, but are not limited to: 1. The effectiveness of the local anesthetics used. 2. The effects of the analgesic(s) and/or anxiolytic(s) used. 3. The degree of discomfort experienced by the patient at the time of the procedure. 4. The patients ability and reliability in recalling and recording the events. 5. The presence and influence of possible secondary gains and/or psychosocial factors. Reported result: Relief experienced during the 1st hour after the procedure: 100 % (Ultra-Short Term Relief) Interpretative annotation: Analgesia during this period is likely to be Local Anesthetic and/or IV Sedative (Analgesic/Anxiolitic) related.          Effects of local anesthetic: The analgesic effects attained during this period are directly associated to the localized infiltration of local anesthetics and therefore cary significant diagnostic value as to the etiological location, or anatomical origin, of the pain. Expected duration of relief is directly dependent on the pharmacodynamics of the local anesthetic used. Long-acting (4-6 hours) anesthetics used.  Reported result: Relief during the next 4 to 6 hour after the procedure: 100 % (Short-Term Relief) Interpretative annotation: Complete relief would suggest area to be the source of the pain.          Long-term benefit: Defined as the period of time past the expected duration of local anesthetics. With the possible exception of prolonged sympathetic blockade from the local anesthetics, benefits during this period are typically attributed to, or associated with, other factors such as analgesic sensory neuropraxia, antiinflammatory effects, or beneficial biochemical changes provided by agents other than the local anesthetics Reported result: Extended relief following  procedure: 50 % (the back pain is gone, but still has pain in left rib in front) (Long-Term Relief) Interpretative annotation: Partial relief. This could suggest the algesic mechanism to be a combination of tissue inflammation and mechanical problems.          Current benefits: Defined as persistent relief that continues at this point in time.   Reported results: Treated area: 50 %       Interpretative annotation: Ongoing benefits would suggest effective therapeutic approach  Interpretation: Results would suggest a successful diagnostic intervention.          Laboratory Chemistry  Inflammation Markers Lab Results  Component Value Date   CRP <0.8 08/07/2016   ESRSEDRATE 24 (H) 08/07/2016   (CRP: Acute Phase) (ESR: Chronic Phase) Renal Function Markers Lab Results  Component Value Date   BUN 12 08/07/2016   CREATININE 0.90 08/07/2016   GFRAA >60 08/07/2016   GFRNONAA >60 08/07/2016   Hepatic Function Markers Lab Results  Component Value Date   AST 20 08/07/2016   ALT 16 (L) 08/07/2016   ALBUMIN 3.6 08/07/2016   ALKPHOS 80 08/07/2016   Electrolytes Lab Results  Component Value Date   NA 142 08/07/2016   K 3.6 08/07/2016   CL 108 08/07/2016   CALCIUM 8.9 08/07/2016   MG 1.7 08/07/2016   Neuropathy Markers Lab Results  Component Value Date   VITAMINB12 176 (L) 08/07/2016   Bone Pathology Markers Lab Results  Component Value Date   ALKPHOS 80 08/07/2016   25OHVITD1 27 (L) 08/07/2016   25OHVITD2 20 08/07/2016   25OHVITD3 7.0 08/07/2016   CALCIUM 8.9 08/07/2016   Coagulation Parameters Lab Results  Component Value Date   INR 0.83 04/11/2016   LABPROT 11.4 04/11/2016   PLT 318 04/18/2016   Cardiovascular Markers Lab Results  Component Value Date   HGB 12.5 (L) 04/18/2016   HCT 36.8 (L) 04/18/2016   Note:  Lab results reviewed.  Recent Diagnostic Imaging Review  Dg C-arm 1-60 Min-no Report  Result Date: 11/19/2016 Fluoroscopy was utilized by the  requesting physician.  No radiographic interpretation.   Note: Imaging results reviewed.          Meds  The patient has a current medication list which includes the following prescription(s): albuterol, albuterol, bupropion, vitamin d3, cyanocobalamin, cyclobenzaprine, diltiazem, duloxetine, finasteride, fluticasone, fluticasone-salmeterol, furosemide, gabapentin, ipratropium, lidocaine, meloxicam, metoprolol tartrate, nicotine, omeprazole, polyethylene glycol, prednisone, simvastatin, tamsulosin, tiotropium, tramadol, trazodone, vitamin d (ergocalciferol), and zinc oxide.  Current Outpatient Prescriptions on File Prior to Visit  Medication Sig  . albuterol (PROVENTIL HFA;VENTOLIN HFA) 108 (90 Base) MCG/ACT inhaler Inhale 2 puffs into the lungs every 4 (four) hours as needed for wheezing or shortness of breath. Reported on 10/26/2015  . buPROPion (WELLBUTRIN) 75 MG tablet Take 75 mg by mouth 2 (two) times daily. Reported on 10/26/2015  . Cyanocobalamin (VITAMELTS ENERGY VITAMIN B-12) 1500 MCG TBDP Take 1 tablet by mouth daily.  . cyclobenzaprine (FLEXERIL) 10 MG tablet Take 1 tablet (10 mg total) by mouth 3 (three) times daily as needed for muscle spasms.  Marland Kitchen diltiazem (CARDIZEM SR) 90 MG 12 hr capsule TAKE ONE CAPSULE BY MOUTH TWICE DAILY  . DULoxetine (CYMBALTA) 60 MG capsule Take 60 mg by mouth daily. Reported on 10/26/2015  . finasteride (PROSCAR) 5 MG tablet Take 5 mg by mouth daily.  . fluticasone (FLONASE) 50 MCG/ACT nasal spray SHAKE LIQUID AND USE 2 SPRAYS IN EACH NOSTRIL DAILY  . Fluticasone-Salmeterol (ADVAIR DISKUS) 250-50 MCG/DOSE AEPB Inhale into the lungs.  . furosemide (LASIX) 20 MG tablet Take 1 tablet (20 mg total) by mouth daily. For 1 week and then when necessary as needed for swelling and edema  . gabapentin (NEURONTIN) 800 MG tablet Take 1 tablet (800 mg total) by mouth every 8 (eight) hours.  Marland Kitchen ipratropium (ATROVENT HFA) 17 MCG/ACT inhaler Inhale 2 puffs into the lungs every 6  (six) hours.  . lidocaine (XYLOCAINE) 5 % ointment Apply 1 application topically See admin instructions. 2 to 4 times daily  . meloxicam (MOBIC) 15 MG tablet Take 1 tablet (15 mg total) by mouth daily.  . metoprolol tartrate (LOPRESSOR) 25 MG tablet Take 1 tablet (25 mg total) by mouth 2 (two) times daily.  . nicotine (NICODERM CQ - DOSED IN MG/24 HOURS) 21 mg/24hr patch Place 21 mg onto the skin daily.   Marland Kitchen omeprazole (PRILOSEC) 40 MG capsule Take 40 mg by mouth 2 (two) times daily.  . polyethylene glycol (MIRALAX / GLYCOLAX) packet Take 17 g by mouth daily.  . predniSONE (DELTASONE) 10 MG tablet Take 1 tablet by mouth once.  . simvastatin (ZOCOR) 20 MG tablet Take 20 mg by mouth at bedtime.   . tamsulosin (FLOMAX) 0.4 MG CAPS capsule Take 0.4 mg by mouth daily.  Marland Kitchen tiotropium (SPIRIVA HANDIHALER) 18 MCG inhalation capsule Place into inhaler and inhale.  . traMADol (ULTRAM) 50 MG tablet Take 1 tablet (50 mg total) by mouth every 6 (six) hours as needed.  . traZODone (DESYREL) 50 MG tablet Take 150 mg by mouth at bedtime.  . Vitamin D, Ergocalciferol, (DRISDOL) 50000 units CAPS capsule Take 1 capsule (50,000 Units total) by mouth 2 (two) times a week x 6 weeks.  Marland Kitchen zinc oxide 20 % ointment Apply topically.   No current facility-administered medications on file prior to visit.    ROS  Constitutional: Denies any fever or chills Gastrointestinal: No reported hemesis, hematochezia, vomiting,  or acute GI distress Musculoskeletal: Denies any acute onset joint swelling, redness, loss of ROM, or weakness Neurological: No reported episodes of acute onset apraxia, aphasia, dysarthria, agnosia, amnesia, paralysis, loss of coordination, or loss of consciousness  Allergies  Mr. Fread has No Known Allergies.  [place]  Drug: Mr. Treese  reports that he uses drugs. Alcohol:  reports that he does not drink alcohol. Tobacco:  reports that he has quit smoking. His smoking use included Cigarettes. He smoked  0.50 packs per day. He uses smokeless tobacco. Medical:  has a past medical history of Anxiety; Chronic back pain; Chronic neck pain; COPD (chronic obstructive pulmonary disease) ([place]); Depression; Hyperlipidemia; Hypertension; Opiate abuse, continuous; and Pneumonia. Family: family history includes Bladder Cancer in his father.  Past Surgical History:  Procedure Laterality Date  . CHOLECYSTECTOMY    . HERNIA REPAIR     11/17  . NECK SURGERY  2002 approx   fusion, about 12 years ago  . SPINAL FUSION     ?? C3- C4   Constitutional Exam  General appearance: Well nourished, well developed, and well hydrated. In no apparent acute distress Vitals:   12/17/16 1329 12/17/16 1330  BP:  119/81  Pulse:  (!) 102  Temp:  97.9 F (36.6 C)  SpO2:  94%  Weight: 190 lb (86.2 kg)   Height: 5' 11"  (1.803 m)    BMI Assessment: Estimated body mass index is 26.5 kg/m as calculated from the following:   Height as of this encounter: 5' 11"  (1.803 m).   Weight as of this encounter: 190 lb (86.2 kg).  BMI interpretation table: BMI level Category Range association with higher incidence of chronic pain  <18 kg/m2 Underweight   18.5-24.9 kg/m2 Ideal body weight   25-29.9 kg/m2 Overweight Increased incidence by 20%  30-34.9 kg/m2 Obese (Class I) Increased incidence by 68%  35-39.9 kg/m2 Severe obesity (Class II) Increased incidence by 136%  >40 kg/m2 Extreme obesity (Class III) Increased incidence by 254%   BMI Readings from Last 4 Encounters:  12/17/16 26.50 kg/m  11/19/16 26.08 kg/m  11/03/16 26.08 kg/m  10/20/16 26.08 kg/m   Wt Readings from Last 4 Encounters:  12/17/16 190 lb (86.2 kg)  11/19/16 187 lb (84.8 kg)  11/03/16 187 lb (84.8 kg)  10/20/16 187 lb (84.8 kg)  Psych/Mental status: Alert, oriented x 3 (person, place, & time)       Eyes: PERLA Respiratory: No evidence of acute respiratory distress  Cervical Spine Exam  Inspection: No masses, redness, or swelling Alignment:  Symmetrical Functional ROM: Unrestricted ROM      Stability: No instability detected Muscle strength & Tone: Functionally intact Sensory: Unimpaired Palpation: No palpable anomalies              Upper Extremity (UE) Exam    Side: Right upper extremity  Side: Left upper extremity  Inspection: No masses, redness, swelling, or asymmetry. No contractures  Inspection: No masses, redness, swelling, or asymmetry. No contractures  Functional ROM: Unrestricted ROM          Functional ROM: Unrestricted ROM          Muscle strength & Tone: Functionally intact  Muscle strength & Tone: Functionally intact  Sensory: Unimpaired  Sensory: Unimpaired  Palpation: No palpable anomalies              Palpation: No palpable anomalies              Specialized Test(s): Deferred  Specialized Test(s): Deferred          Thoracic Spine Exam  Inspection: No masses, redness, or swelling Alignment: Symmetrical Functional ROM: Unrestricted ROM Stability: No instability detected Sensory: Unimpaired Muscle strength & Tone: No palpable anomalies  Lumbar Spine Exam  Inspection: No masses, redness, or swelling Alignment: Symmetrical Functional ROM: Unrestricted ROM      Stability: No instability detected Muscle strength & Tone: Functionally intact Sensory: Unimpaired Palpation: No palpable anomalies       Provocative Tests: Lumbar Hyperextension and rotation test: evaluation deferred today       Patrick's Maneuver: evaluation deferred today                    Gait & Posture Assessment  Ambulation: Patient ambulates using a cane Gait: Limited. Using assistive device to ambulate Posture: Poor   Lower Extremity Exam    Side: Right lower extremity  Side: Left lower extremity  Inspection: No masses, redness, swelling, or asymmetry. No contractures  Inspection: No masses, redness, swelling, or asymmetry. No contractures  Functional ROM: Unrestricted ROM          Functional ROM: Unrestricted ROM           Muscle strength & Tone: Functionally intact  Muscle strength & Tone: Functionally intact  Sensory: Unimpaired  Sensory: Unimpaired  Palpation: No palpable anomalies  Palpation: No palpable anomalies   Assessment  Primary Diagnosis & Pertinent Problem List: The primary encounter diagnosis was Intercostal neuralgia (Left). Diagnoses of Intercostal neuropathic pain (Left) and Post-thoracotomy pain syndrome (Left) were also pertinent to this visit.  Status Diagnosis  Improving Improving Improving 1. Intercostal neuralgia (Left)   2. Intercostal neuropathic pain (Left)   3. Post-thoracotomy pain syndrome (Left)     Problems updated and reviewed during this visit: Problem  Claudication (Hcc)   Plan of Care  Pharmacotherapy (Medications Ordered): No orders of the defined types were placed in this encounter.  New Prescriptions   No medications on file   Medications administered today: Mr. Franchi had no medications administered during this visit. Lab-work, procedure(s), and/or referral(s): Orders Placed This Encounter  Procedures  . INTERCOSTAL NERVE BLOCK   Imaging and/or referral(s): None  Interventional therapies: Planned, scheduled, and/or pending:   Diagnostic left T12, T11, T10, & T9 intercostal nerve block + Dorsal rami block   Considering:   Diagnostic Left T11 & T12 intercostal nerve block Possible left T11 and T12 intercostal RFA Diagnostic bilateral lumbar facet block + bilateral S-I Block #2 Possible bilateral lumbar facet block + bilateral S-I RFA Diagnostic right-sided lumbar epidural steroid injection  Diagnostic bilateral cervical facet block  Diagnostic right-sided cervical epidural steroid injection  Diagnostic bilateral intra-articular shoulder joint injections  Diagnostic bilateral suprascapular nerve blocks    Palliative PRN treatment(s):   Palliative bilateral lumbar facet block #2 + bilateral S-I block #2.   Provider-requested follow-up: Return  for procedure (w/ sedation):, (ASAP), w/ MD, in addition, Med-Mgmt, w/ NP.  Future Appointments Date Time Provider [place]  12/19/2016 10:30 AM Alfonzo Feller, RN [facility] None  12/23/2016 2:00 PM Alfonzo Feller, RN [facility] None  01/22/2017 1:00 PM Vevelyn Francois, NP [facility] None   Primary Care Physician: Marygrace Drought, MD Location: [facility] Outpatient [facility] Management Facility Note by: Kathlen Brunswick Dossie Arbour, M.D, DABA, DABAPM, DABPM, DABIPP, FIPP Date: 12/17/2016; Time: 3:21 PM  Patient instructions provided during this appointment: Patient Instructions  ____________________________________________________________________________________________  Preparing for Procedure with Sedation Instructions: . Oral Intake: Do not  eat or drink anything for at least 8 hours prior to your procedure. . Transportation: Public transportation is not allowed. Bring an adult driver. The driver must be physically present in our waiting room before any procedure can be started. Marland Kitchen Physical Assistance: Bring an adult physically capable of assisting you, in the event you need help. This adult should keep you company at home for at least 6 hours after the procedure. . Blood Pressure Medicine: Take your blood pressure medicine with a sip of water the morning of the procedure. . Blood thinners:  . Diabetics on insulin: Notify the staff so that you can be scheduled 1st case in the morning. If your diabetes requires high dose insulin, take only  of your normal insulin dose the morning of the procedure and notify the staff that you have done so. . Preventing infections: Shower with an antibacterial soap the morning of your procedure. . Build-up your immune system: Take 1000 mg of Vitamin C with every meal (3 times a day) the day prior to your procedure. Marland Kitchen Antibiotics: Inform the staff if you have a condition or reason that requires you to take antibiotics before dental procedures. . Pregnancy: If you are  pregnant, call and cancel the procedure. . Sickness: If you have a cold, fever, or any active infections, call and cancel the procedure. . Arrival: You must be in the facility at least 30 minutes prior to your scheduled procedure. . Children: Do not bring children with you. . Dress appropriately: Bring dark clothing that you would not mind if they get stained. . Valuables: Do not bring any jewelry or valuables. Procedure appointments are reserved for interventional treatments only. Marland Kitchen No Prescription Refills. . No medication changes will be discussed during procedure appointments. . No disability issues will be discussed. ______________________________________________________________________________________________  Preparing for Procedure with Sedation Instructions: . Oral Intake: Do not eat or drink anything for at least 8 hours prior to your procedure. . Transportation: Public transportation is not allowed. Bring an adult driver. The driver must be physically present in our waiting room before any procedure can be started. Marland Kitchen Physical Assistance: Bring an adult capable of physically assisting you, in the event you need help. . Blood Pressure Medicine: Take your blood pressure medicine with a sip of water the morning of the procedure. . Insulin: Take only  of your normal insulin dose. . Preventing infections: Shower with an antibacterial soap the morning of your procedure. . Build-up your immune system: Take 1000 mg of Vitamin C with every meal (3 times a day) the day prior to your procedure. . Pregnancy: If you are pregnant, call and cancel the procedure. . Sickness: If you have a cold, fever, or any active infections, call and cancel the procedure. . Arrival: You must be in the facility at least 30 minutes prior to your scheduled procedure. . Children: Do not bring children with you. . Dress appropriately: Bring dark clothing that you would not mind if they get stained. . Valuables: Do  not bring any jewelry or valuables. Procedure appointments are reserved for interventional treatments only. Marland Kitchen No Prescription Refills. . No medication changes will be discussed during procedure appointments. No disability issues will be discussed.Intercostal Nerve Block Patient Information  Description: The twelve intercostal nerves arise from the first thru twelfth thoracic nerve roots.  The nerve begins at the spine and wraps around the body, lying in a groove underneath each rib.  Each intercostal nerve innervates a specific strip of skin and body  walk of the abdomen and chest.  Therefore, injuries of the chest wall or abdominal wall result in pain that is transmitted back to the brian via the intercostal nerves.  Examples of such injuries include rib fractures and incisions for lung and gall bladder surgery.  Occasionally, pain may persist long after an injury or surgical incision secondary to inflammation and irritation of the intercostal nerve.  The longstanding pain is known as intercostal neuralgia.  An intercostal nerve block is preformed to eliminate pain either temporarily or permanently.  A small needle is placed below the rib and local anesthetic (like Novocaine) and possibly steroid is injected.  Usually 2-4 intercostal nerves are blocked at a time depending on the problem.  The patient will experience a slight "pin-prick" sensation for each injection.  Shortly thereafter, the strip of skin that is innervated by the blocked intercostal nerve will feel numb.  Persistent pain that is only temporarily relieved with local anesthetic may require a more permanent block. This procedure is called Cryoneurolysis and entails placing a small probe beneath the rib to freeze the nerve.  Conditions that may be treated by intercostal nerve blocks:   Rib fractures  Longstanding pain from surgery of the chest or abdomen (intercostal neuralgia)  Pain from chest tubes  Pain from trauma to the  chest  Preparation for the injections:  1. Do not eat any solid food or dairy products within 8 hours of your appointment. 2. You may drink clear liquids up to 3 hours before appointment.  Clear liquids include water, black coffee, juice or soda.  No milk or cream please. 3. You may take your regular medication, including pain medications, with a sip of water before your appointment.  Diabetics should hold regular insulin (if take separately) and take 1/2 normal NPH dose the morning of the procedure.   Carry some sugar containing items with you to your appointment. 4. A driver must accompany you and be prepared to drive you home after your procedure. 5. Bring all your current medications with you. 6. An IV may be inserted and sedation may be given at the discretion of the physician. 7. A blood pressure cuff, EKG and other monitors will often be applied during the procedure.  Some patients may need to have extra oxygen administered for a short period. 8. You will be asked to provide medical information, including your allergies, prior to the procedure.  We must know immediately if you are taking blood thinners (like Coumadin/Warfarin) or if you are allergic to IV iodine contrast (dye). We must know if you could possible be pregnant.  Possible side-effects:   Bleeding from needle site  Infection (rare)  Nerve injury (rare)  Numbness & tingling of skin  Collapsed lung requiring chest tube (rare)  Local anesthetic toxicity (rare)  Light-headedness (temporary)  Pain at injection site (several days)  Decreased blood pressure (temporary)  Shortness of breath  Jittery/shaking sensation (temporary)  Call if you experience:   Difficulty breathing or hives (go directly to the emergency room)  Redness, inflammation or drainage at the injection site  Severe pain at the site of the injection  Any new symptoms which are concerning   Please note:  Your pain may subside immediately  but may return several hours after the injection.  Often, more than one injection is required to reduce the pain. Also, if several temporary blocks with local anesthetic are ineffective, a more permanent block with cryolysis may be necessary.  This will be discussed  with you should this be the case.  If you have any questions, please call [phone] [facility]

## 2016-12-19 ENCOUNTER — Other Ambulatory Visit: Payer: Self-pay | Admitting: *Deleted

## 2016-12-19 NOTE — Patient Outreach (Signed)
[facility]) Care Management  12/19/2016  Adyan Palau. [birth year] [id number]   Care coordination call  Placed call to [facility] pulmonary rehab, to follow regarding whether they had received referral from pulmonary rehab, Angela Nevin verifies she has received referral and has been able to make contact with patient.  Plan Will continue with telephone outreach to patient in the next week.  [name], RN, [place] [facility] [phone]- Mobile [phone]- Toll Free Main Office

## 2016-12-23 ENCOUNTER — Other Ambulatory Visit: Payer: Self-pay | Admitting: *Deleted

## 2016-12-23 NOTE — Patient Outreach (Signed)
[facility]) Care Management  12/23/2016  Stanley Rios. Jun 12, 1956 [id number]   Telephone follow up call  Placed call to patient , no answer able to leave a hipaa compliant message requesting a return call.  Plan Will await return call if no response with attempt outreach call within the next  week.   [name], RN, [place] [facility] [phone]- Mobile [phone]- Toll Free Main Office

## 2016-12-24 ENCOUNTER — Other Ambulatory Visit: Payer: Self-pay | Admitting: Pain Medicine

## 2016-12-24 DIAGNOSIS — M150 Primary generalized (osteo)arthritis: Principal | ICD-10-CM

## 2016-12-24 DIAGNOSIS — M159 Polyosteoarthritis, unspecified: Secondary | ICD-10-CM

## 2016-12-26 ENCOUNTER — Other Ambulatory Visit: Payer: Self-pay | Admitting: *Deleted

## 2016-12-26 NOTE — Patient Outreach (Signed)
[facility]) Care Management  12/26/2016  Stanley Rios. Apr 18, 1956 [id number]   Received return call from Stanley Rios, wife of patient.  She reports patient is still bothered by pain at his back, legs and left side area, he continues follow up at pain clinic. Patient has additional treatment at pain clinic for nerve block procedure in the next week. Patient continues to wear his oxygen on and off during the day and at night when sleeping. Wife reports patients' pain in legs when walking somewhat limits his activity and increases shortness of breath at times.   Patient has completed 2 D echo, and received report no new follow required at this time per wife.  Patient has completed sleep study  Awaiting follow up for results.   Patient has been contacted by Pulmonary Rehab at [facility] and has evaluation scheduled in next 2 weeks, he willing to give it a try.   Wife discussed finances get tight with speciality appointments he had last month , but they are making it, she has started process for medicaid. Wife expressed that patient is able to afford medications states he has extra help and most he pays for a prescription is $11 for Advair but it adds up.  Patient continues to attend all follow up appointments , his wife provides transportation, no [facility] readmission in the last 3 months.Patient and wife able to recognize worsening signs/yellow zone on COPD and state action plan.    Plan Will plan home visit in the next 2 weeks and if no new concerns arise will possible case closure.   [name], RN, [place] [facility] [phone]- Mobile [phone]- Toll Free Main Office

## 2016-12-29 ENCOUNTER — Ambulatory Visit: Payer: Self-pay | Admitting: *Deleted

## 2016-12-30 DIAGNOSIS — I739 Peripheral vascular disease, unspecified: Secondary | ICD-10-CM | POA: Diagnosis not present

## 2016-12-30 DIAGNOSIS — F17211 Nicotine dependence, cigarettes, in remission: Secondary | ICD-10-CM | POA: Diagnosis not present

## 2016-12-30 DIAGNOSIS — R918 Other nonspecific abnormal finding of lung field: Secondary | ICD-10-CM | POA: Diagnosis not present

## 2016-12-30 DIAGNOSIS — J449 Chronic obstructive pulmonary disease, unspecified: Secondary | ICD-10-CM | POA: Diagnosis not present

## 2016-12-30 DIAGNOSIS — Z87891 Personal history of nicotine dependence: Secondary | ICD-10-CM | POA: Diagnosis not present

## 2016-12-30 DIAGNOSIS — F17201 Nicotine dependence, unspecified, in remission: Secondary | ICD-10-CM | POA: Diagnosis not present

## 2016-12-31 ENCOUNTER — Ambulatory Visit
Admission: RE | Admit: 2016-12-31 | Discharge: 2016-12-31 | Disposition: A | Discharge status: Home / Self Care | Payer: PPO | Source: Ambulatory Visit | Attending: Pain Medicine | Admitting: Pain Medicine

## 2016-12-31 ENCOUNTER — Encounter: Payer: Self-pay | Admitting: Pain Medicine

## 2016-12-31 ENCOUNTER — Ambulatory Visit (HOSPITAL_BASED_OUTPATIENT_CLINIC_OR_DEPARTMENT_OTHER): Payer: PPO | Admitting: Pain Medicine

## 2016-12-31 VITALS — BP 121/87 | HR 94 | Temp 97.6°F | Resp 15 | Ht 71.0 in | Wt 190.0 lb

## 2016-12-31 DIAGNOSIS — M4696 Unspecified inflammatory spondylopathy, lumbar region: Secondary | ICD-10-CM | POA: Diagnosis not present

## 2016-12-31 DIAGNOSIS — M545 Low back pain: Secondary | ICD-10-CM | POA: Diagnosis not present

## 2016-12-31 DIAGNOSIS — J449 Chronic obstructive pulmonary disease, unspecified: Secondary | ICD-10-CM

## 2016-12-31 DIAGNOSIS — G8929 Other chronic pain: Secondary | ICD-10-CM | POA: Insufficient documentation

## 2016-12-31 DIAGNOSIS — R0789 Other chest pain: Secondary | ICD-10-CM | POA: Insufficient documentation

## 2016-12-31 DIAGNOSIS — J9611 Chronic respiratory failure with hypoxia: Secondary | ICD-10-CM

## 2016-12-31 DIAGNOSIS — M47816 Spondylosis without myelopathy or radiculopathy, lumbar region: Secondary | ICD-10-CM

## 2016-12-31 MED ORDER — ROPIVACAINE HCL 2 MG/ML IJ SOLN
9.0000 mL | Freq: Once | INTRAMUSCULAR | Status: AC
  Administered 2016-12-31: 10 mL via PERINEURAL

## 2016-12-31 MED ORDER — TRIAMCINOLONE ACETONIDE 40 MG/ML IJ SUSP
INTRAMUSCULAR | Status: AC
  Filled 2016-12-31: qty 2

## 2016-12-31 MED ORDER — LIDOCAINE HCL (PF) 1 % IJ SOLN
10.0000 mL | Freq: Once | INTRAMUSCULAR | Status: AC
  Administered 2016-12-31: 5 mL

## 2016-12-31 MED ORDER — LACTATED RINGERS IV SOLN
1000.0000 mL | Freq: Once | INTRAVENOUS | Status: AC
  Administered 2016-12-31: 1000 mL via INTRAVENOUS

## 2016-12-31 MED ORDER — ROPIVACAINE HCL 2 MG/ML IJ SOLN
INTRAMUSCULAR | Status: AC
  Filled 2016-12-31: qty 20

## 2016-12-31 MED ORDER — FENTANYL CITRATE (PF) 100 MCG/2ML IJ SOLN
25.0000 ug | INTRAMUSCULAR | Status: DC | PRN
  Administered 2016-12-31: 50 ug via INTRAVENOUS

## 2016-12-31 MED ORDER — TRIAMCINOLONE ACETONIDE 40 MG/ML IJ SUSP
40.0000 mg | Freq: Once | INTRAMUSCULAR | Status: AC
  Administered 2016-12-31: 40 mg

## 2016-12-31 MED ORDER — MIDAZOLAM HCL 5 MG/5ML IJ SOLN
INTRAMUSCULAR | Status: AC
  Filled 2016-12-31: qty 5

## 2016-12-31 MED ORDER — LIDOCAINE HCL (PF) 1 % IJ SOLN
INTRAMUSCULAR | Status: AC
  Filled 2016-12-31: qty 10

## 2016-12-31 MED ORDER — MIDAZOLAM HCL 5 MG/5ML IJ SOLN
1.0000 mg | INTRAMUSCULAR | Status: DC | PRN
  Administered 2016-12-31: 2 mg via INTRAVENOUS

## 2016-12-31 MED ORDER — FENTANYL CITRATE (PF) 100 MCG/2ML IJ SOLN
INTRAMUSCULAR | Status: AC
  Filled 2016-12-31: qty 2

## 2016-12-31 NOTE — Progress Notes (Signed)
Patient's Name: Stanley Rios.  MRN: [id number]  Referring Provider: Milinda Pointer, MD  DOB: Jul 22, 1956  PCP: Marygrace Drought, MD  DOS: 12/31/2016  Note by: Kathlen Brunswick. Dossie Arbour, MD  Service setting: Ambulatory outpatient  Location: [facility] (AMB) Pain Management Facility  Visit type: Procedure  Specialty: Interventional Pain Management  Patient type: Established   Primary Reason for Visit: Interventional Pain Management Treatment. CC: Chest Pain (left torso)  Procedure:  Anesthesia, Analgesia, Anxiolysis:  Type: Diagnostic Medial Branch Facet Block Region: Lumbar Level: L2, L3, L4, L5, & S1 Medial Branch Level(s) Laterality: Bilateral  Type: Local Anesthesia with Moderate (Conscious) Sedation Local Anesthetic: Lidocaine 1% Route: Intravenous (IV) IV Access: Secured Sedation: Meaningful verbal contact was maintained at all times during the procedure  Indication(s): Analgesia and Anxiety  Indications: 1. Lumbar facet syndrome (Bilateral) (R>L)   2. Chronic low back pain (Location of Primary Source of Pain) (Bilateral) (R>L)   3. Chronic hypoxemic respiratory failure (HCC)   4. Chronic obstructive pulmonary disease, unspecified COPD type ([place])    Pain Score: Pre-procedure: 3 /10 Post-procedure: 0-No pain/10  Pre-op Assessment:  Previous date of service: 12/17/16 Service provided: Evaluation Mr. Fesler is a 61 y.o. (year old), male patient, seen today for interventional treatment. He  has a past surgical history that includes Cholecystectomy; Spinal fusion; Neck surgery (2002 approx); and Hernia repair. His primarily concern today is the Chest Pain (left torso)  Initial Vital Signs: Blood pressure 123/75, pulse 99, temperature 97.6 F (36.4 C), temperature source Oral, resp. rate 16, height 5\' 11"  (1.803 m), weight 190 lb (86.2 kg), SpO2 94 %. BMI: 26.50 kg/m  Risk Assessment: Allergies: Reviewed. He has No Known Allergies.  Allergy Precautions: None required Coagulopathies:  Reviewed. None identified.  Blood-thinner therapy: None at this time Active Infection(s): Reviewed. None identified. Mr. Tetrick is afebrile  Site Confirmation: Mr. Hurlbut was asked to confirm the procedure and laterality before marking the site Procedure checklist: Completed Consent: Before the procedure and under the influence of no sedative(s), amnesic(s), or anxiolytics, the patient was informed of the treatment options, risks and possible complications. To fulfill our ethical and legal obligations, as recommended by the American Medical Association's Code of Ethics, I have informed the patient of my clinical impression; the nature and purpose of the treatment or procedure; the risks, benefits, and possible complications of the intervention; the alternatives, including doing nothing; the risk(s) and benefit(s) of the alternative treatment(s) or procedure(s); and the risk(s) and benefit(s) of doing nothing. The patient was provided information about the general risks and possible complications associated with the procedure. These may include, but are not limited to: failure to achieve desired goals, infection, bleeding, organ or nerve damage, allergic reactions, paralysis, and death. In addition, the patient was informed of those risks and complications associated to Spine-related procedures, such as failure to decrease pain; infection (i.e.: Meningitis, epidural or intraspinal abscess); bleeding (i.e.: epidural hematoma, subarachnoid hemorrhage, or any other type of intraspinal or peri-dural bleeding); organ or nerve damage (i.e.: Any type of peripheral nerve, nerve root, or spinal cord injury) with subsequent damage to sensory, motor, and/or autonomic systems, resulting in permanent pain, numbness, and/or weakness of one or several areas of the body; allergic reactions; (i.e.: anaphylactic reaction); and/or death. Furthermore, the patient was informed of those risks and complications associated with the  medications. These include, but are not limited to: allergic reactions (i.e.: anaphylactic or anaphylactoid reaction(s)); adrenal axis suppression; blood sugar elevation that in diabetics may result in  ketoacidosis or comma; water retention that in patients with history of congestive heart failure may result in shortness of breath, pulmonary edema, and decompensation with resultant heart failure; weight gain; swelling or edema; medication-induced neural toxicity; particulate matter embolism and blood vessel occlusion with resultant organ, and/or nervous system infarction; and/or aseptic necrosis of one or more joints. Finally, the patient was informed that Medicine is not an exact science; therefore, there is also the possibility of unforeseen or unpredictable risks and/or possible complications that may result in a catastrophic outcome. The patient indicated having understood very clearly. We have given the patient no guarantees and we have made no promises. Enough time was given to the patient to ask questions, all of which were answered to the patient's satisfaction. Mr. Montesinos has indicated that he wanted to continue with the procedure. Attestation: I, the ordering provider, attest that I have discussed with the patient the benefits, risks, side-effects, alternatives, likelihood of achieving goals, and potential problems during recovery for the procedure that I have provided informed consent. Date: 12/31/2016; Time: 10:16 AM  Pre-Procedure Preparation:  Monitoring: As per clinic protocol. Respiration, ETCO2, SpO2, BP, heart rate and rhythm monitor placed and checked for adequate function Safety Precautions: Patient was assessed for positional comfort and pressure points before starting the procedure. Time-out: I initiated and conducted the "Time-out" before starting the procedure, as per protocol. The patient was asked to participate by confirming the accuracy of the "Time Out" information. Verification of  the correct person, site, and procedure were performed and confirmed by me, the nursing staff, and the patient. "Time-out" conducted as per Joint Commission's Universal Protocol (UP.01.01.01). "Time-out" Date & Time: 12/31/2016; 1101 hrs.  Description of Procedure Process:   Position: Prone Target Area: For Lumbar Facet blocks, the target is the groove formed by the junction of the transverse process and superior articular process. For the L5 dorsal ramus, the target is the notch between superior articular process and sacral ala. For the S1 dorsal ramus, the target is the superior and lateral edge of the posterior S1 Sacral foramen. Approach: Paramedial approach. Area Prepped: Entire Posterior Lumbosacral Region Prepping solution: ChloraPrep (2% chlorhexidine gluconate and 70% isopropyl alcohol) Safety Precautions: Aspiration looking for blood return was conducted prior to all injections. At no point did we inject any substances, as a needle was being advanced. No attempts were made at seeking any paresthesias. Safe injection practices and needle disposal techniques used. Medications properly checked for expiration dates. SDV (single dose vial) medications used. Description of the Procedure: Protocol guidelines were followed. The patient was placed in position over the fluoroscopy table. The target area was identified and the area prepped in the usual manner. Skin desensitized using vapocoolant spray. Skin & deeper tissues infiltrated with local anesthetic. Appropriate amount of time allowed to pass for local anesthetics to take effect. The procedure needle was introduced through the skin, ipsilateral to the reported pain, and advanced to the target area. Employing the "Medial Branch Technique", the needles were advanced to the angle made by the superior and medial portion of the transverse process, and the lateral and inferior portion of the superior articulating process of the targeted vertebral bodies.  This area is known as "Burton's Eye" or the "Eye of the [place] Dog". A procedure needle was introduced through the skin, and this time advanced to the angle made by the superior and medial border of the sacral ala, and the lateral border of the S1 vertebral body. This last needle was later  repositioned at the superior and lateral border of the posterior S1 foramen. Negative aspiration confirmed. Solution injected in intermittent fashion, asking for systemic symptoms every 0.5cc of injectate. The needles were then removed and the area cleansed, making sure to leave some of the prepping solution back to take advantage of its long term bactericidal properties. Vitals:   12/31/16 1117 12/31/16 1122 12/31/16 1132 12/31/16 1142  BP: 136/77 132/83 134/75 121/87  Pulse:      Resp: 19 15 18 15   Temp:      TempSrc:      SpO2: 95% 97% 96% 97%  Weight:      Height:        Start Time: 1101 hrs. End Time: 1108 hrs.  Illustration of the posterior view of the lumbar spine and the posterior neural structures. Laminae of L2 through S1 are labeled. DPRL5, dorsal primary ramus of L5; DPRS1, dorsal primary ramus of S1; DPR3, dorsal primary ramus of L3; FJ, facet (zygapophyseal) joint L3-L4; I, inferior articular process of L4; LB1, lateral branch of dorsal primary ramus of L1; IAB, inferior articular branches from L3 medial branch (supplies L4-L5 facet joint); IBP, intermediate branch plexus; MB3, medial branch of dorsal primary ramus of L3; NR3, third lumbar nerve root; S, superior articular process of L5; SAB, superior articular branches from L4 (supplies L4-5 facet joint also); TP3, transverse process of L3.  Materials:  Needle(s) Type: Regular needle Gauge: 22G Length: 3.5-in Medication(s): We administered lactated ringers, midazolam, fentaNYL, lidocaine (PF), triamcinolone acetonide, ropivacaine (PF) 2 mg/mL (0.2%), lidocaine (PF), triamcinolone acetonide, and ropivacaine (PF) 2 mg/mL (0.2%). Please see chart  orders for dosing details.  Imaging Guidance (Spinal):  Type of Imaging Technique: Fluoroscopy Guidance (Spinal) Indication(s): Assistance in needle guidance and placement for procedures requiring needle placement in or near specific anatomical locations not easily accessible without such assistance. Exposure Time: Please see nurses notes. Contrast: None used. Fluoroscopic Guidance: I was personally present during the use of fluoroscopy. "Tunnel Vision Technique" used to obtain the best possible view of the target area. Parallax error corrected before commencing the procedure. "Direction-depth-direction" technique used to introduce the needle under continuous pulsed fluoroscopy. Once target was reached, antero-posterior, oblique, and lateral fluoroscopic projection used confirm needle placement in all planes. Images permanently stored in EMR. Interpretation: No contrast injected. I personally interpreted the imaging intraoperatively. Adequate needle placement confirmed in multiple planes. Permanent images saved into the patient's record.  Antibiotic Prophylaxis:  Indication(s): None identified Antibiotic given: None  Post-operative Assessment:  EBL: None Complications: No immediate post-treatment complications observed by team, or reported by patient. Note: The patient tolerated the entire procedure well. A repeat set of vitals were taken after the procedure and the patient was kept under observation following institutional policy, for this type of procedure. Post-procedural neurological assessment was performed, showing return to baseline, prior to discharge. The patient was provided with post-procedure discharge instructions, including a section on how to identify potential problems. Should any problems arise concerning this procedure, the patient was given instructions to immediately contact us, at any time, without hesitation. In any case, we plan to contact the patient by telephone for a  follow-up status report regarding this interventional procedure. Comments:  No additional relevant information.  Plan of Care  Disposition: Discharge home  Discharge Date & Time: 12/31/2016; 1147 hrs.  Physician-requested Follow-up:  Return for post-procedure eval (in 2 wks), by MD.  Future Appointments Date Time Provider [place]  01/05/2017 2:30 PM [facility] Harriette Ohara  [facility] None  01/06/2017 11:30 AM Alfonzo Feller, RN [facility] None  01/22/2017 11:00 AM Milinda Pointer, MD [facility] None   Medications ordered for procedure: Meds ordered this encounter  Medications  . lactated ringers infusion 1,000 mL  . midazolam (VERSED) 5 MG/5ML injection 1-2 mg    Make sure Flumazenil is available in the pyxis when using this medication. If oversedation occurs, administer 0.2 mg IV over 15 sec. If after 45 sec no response, administer 0.2 mg again over 1 min; may repeat at 1 min intervals; not to exceed 4 doses (1 mg)  . fentaNYL (SUBLIMAZE) injection 25-50 mcg    Make sure Narcan is available in the pyxis when using this medication. In the event of respiratory depression (RR< 8/min): Titrate NARCAN (naloxone) in increments of 0.1 to 0.2 mg IV at 2-3 minute intervals, until desired degree of reversal.  . lidocaine (PF) (XYLOCAINE) 1 % injection 10 mL  . triamcinolone acetonide (KENALOG-40) injection 40 mg  . ropivacaine (PF) 2 mg/mL (0.2%) (NAROPIN) injection 9 mL  . lidocaine (PF) (XYLOCAINE) 1 % injection 10 mL  . triamcinolone acetonide (KENALOG-40) injection 40 mg  . ropivacaine (PF) 2 mg/mL (0.2%) (NAROPIN) injection 9 mL   Medications administered: We administered lactated ringers, midazolam, fentaNYL, lidocaine (PF), triamcinolone acetonide, ropivacaine (PF) 2 mg/mL (0.2%), lidocaine (PF), triamcinolone acetonide, and ropivacaine (PF) 2 mg/mL (0.2%).  See the medical record for exact dosing, route, and time of administration.  Lab-work, Procedure(s), & Referral(s)  Ordered: Orders Placed This Encounter  Procedures  . DG C-Arm 1-60 Min-No Report  . Informed Consent Details: Transcribe to consent form and obtain patient signature  . Provider attestation of informed consent for procedure/surgical case  . Verify informed consent  . Discharge instructions  . Follow-up   Imaging Ordered: Results for orders placed in visit on 11/19/16  DG C-Arm 1-60 Min-No Report   Narrative Fluoroscopy was utilized by the requesting physician.  No radiographic  interpretation.    New Prescriptions   No medications on file   Primary Care Physician: Marygrace Drought, MD Location: [facility] Note by: Kathlen Brunswick. Dossie Arbour, M.D, DABA, DABAPM, DABPM, DABIPP, FIPP Date: 12/31/2016; Time: 1:13 PM  Disclaimer:  Medicine is not an Chief Strategy Officer. The only guarantee in medicine is that nothing is guaranteed. It is important to note that the decision to proceed with this intervention was based on the information collected from the patient. The Data and conclusions were drawn from the patient's questionnaire, the interview, and the physical examination. Because the information was provided in large part by the patient, it cannot be guaranteed that it has not been purposely or unconsciously manipulated. Every effort has been made to obtain as much relevant data as possible for this evaluation. It is important to note that the conclusions that lead to this procedure are derived in large part from the available data. Always take into account that the treatment will also be dependent on availability of resources and existing treatment guidelines, considered by other Pain Management Practitioners as being common knowledge and practice, at the time of the intervention. For Medico-Legal purposes, it is also important to point out that variation in procedural techniques and pharmacological choices are the acceptable norm. The indications, contraindications, technique,  and results of the above procedure should only be interpreted and judged by a Board-Certified Interventional Pain Specialist with extensive familiarity and expertise in the same exact procedure and technique.  Instructions provided at this appointment: Patient Instructions  ____________________________________________________________________________________________  Post-Procedure instructions Instructions:  Apply ice: Fill a plastic sandwich bag with crushed ice. Cover it with a small towel and apply to injection site. Apply for 15 minutes then remove x 15 minutes. Repeat sequence on day of procedure, until you go to bed. The purpose is to minimize swelling and discomfort after procedure.  Apply heat: Apply heat to procedure site starting the day following the procedure. The purpose is to treat any soreness and discomfort from the procedure.  Food intake: Start with clear liquids (like water) and advance to regular food, as tolerated.   Physical activities: Keep activities to a minimum for the first 8 hours after the procedure.   Driving: If you have received any sedation, you are not allowed to drive for 24 hours after your procedure.  Blood thinner: Restart your blood thinner 6 hours after your procedure. (Only for those taking blood thinners)  Insulin: As soon as you can eat, you may resume your normal dosing schedule. (Only for those taking insulin)  Infection prevention: Keep procedure site clean and dry.  Post-procedure Pain Diary: Extremely important that this be done correctly and accurately. Recorded information will be used to determine the next step in treatment.  Pain evaluated is that of treated area only. Do not include pain from an untreated area.  Complete every hour, on the hour, for the initial 8 hours. Set an alarm to help you do this part accurately.  Do not go to sleep and have it completed later. It will not be accurate.  Follow-up appointment: Keep your  follow-up appointment after the procedure. Usually 2 weeks for most procedures. (6 weeks in the case of radiofrequency.) Bring you pain diary.  Expect:  From numbing medicine (AKA: Local Anesthetics): Numbness or decrease in pain.  Onset: Full effect within 15 minutes of injected.  Duration: It will depend on the type of local anesthetic used. On the average, 1 to 8 hours.   From steroids: Decrease in swelling or inflammation. Once inflammation is improved, relief of the pain will follow.  Onset of benefits: Depends on the amount of swelling present. The more swelling, the longer it will take for the benefits to be seen.   Duration: Steroids will stay in the system x 2 weeks. Duration of benefits will depend on multiple posibilities including persistent irritating factors.  From procedure: Some discomfort is to be expected once the numbing medicine wears off. This should be minimal if ice and heat are applied as instructed. Call if:  You experience numbness and weakness that gets worse with time, as opposed to wearing off.  New onset bowel or bladder incontinence. (Spinal procedures only)  Emergency Numbers:  Durning business hours (Monday - Thursday, 8:00 AM - 4:00 PM) (Friday, 9:00 AM - 12:00 Noon): [phone]  After hours: [phone] ____________________________________________________________________________________________  Pain Management Discharge Instructions  General Discharge Instructions :  If you need to reach your doctor call: Monday-Friday 8:00 am - 4:00 pm at [phone] or toll free [phone].  After clinic hours [phone] to have operator reach doctor.  Bring all of your medication bottles to all your appointments in the pain clinic.  To cancel or reschedule your appointment with Pain Management please remember to call 24 hours in advance to avoid a fee.  Refer to the educational materials which you have been given on: General Risks, I had my  Procedure. Discharge Instructions, Post Sedation.  Post Procedure Instructions:  The drugs you were given will stay in  your system until tomorrow, so for the next 24 hours you should not drive, make any legal decisions or drink any alcoholic beverages.  You may eat anything you prefer, but it is better to start with liquids then soups and crackers, and gradually work up to solid foods.  Please notify your doctor immediately if you have any unusual bleeding, trouble breathing or pain that is not related to your normal pain.  Depending on the type of procedure that was done, some parts of your body may feel week and/or numb.  This usually clears up by tonight or the next day.  Walk with the use of an assistive device or accompanied by an adult for the 24 hours.  You may use ice on the affected area for the first 24 hours.  Put ice in a Ziploc bag and cover with a towel and place against area 15 minutes on 15 minutes off.  You may switch to heat after 24 hours.

## 2016-12-31 NOTE — Progress Notes (Signed)
Safety precautions to be maintained throughout the outpatient stay will include: orient to surroundings, keep bed in low position, maintain call bell within reach at all times, provide assistance with transfer out of bed and ambulation.  

## 2016-12-31 NOTE — Patient Instructions (Addendum)

## 2017-01-01 ENCOUNTER — Telehealth: Payer: Self-pay

## 2017-01-01 NOTE — Telephone Encounter (Signed)
Denies any pain at this time "I am doing great". Instructed to call if needed.

## 2017-01-05 ENCOUNTER — Encounter: Payer: PPO | Attending: Pulmonary Disease | Admitting: Respiratory Therapy

## 2017-01-05 ENCOUNTER — Encounter: Payer: Self-pay | Admitting: Respiratory Therapy

## 2017-01-05 VITALS — Ht 70.9 in | Wt 191.6 lb

## 2017-01-05 DIAGNOSIS — J449 Chronic obstructive pulmonary disease, unspecified: Secondary | ICD-10-CM | POA: Insufficient documentation

## 2017-01-05 NOTE — Patient Instructions (Addendum)
Patient Instructions  Patient Details  Name: Stanley Rios. MRN: [id number] Date of Birth: 1956/06/03 Referring Provider:  Delfino Lovett, MD  Below are the personal goals you chose as well as exercise and nutrition goals. Our goal is to help you keep on track towards obtaining and maintaining your goals. We will be discussing your progress on these goals with you throughout the program.  Initial Exercise Prescription:     Initial Exercise Prescription - 01/05/17 1600      Date of Initial Exercise RX and Referring Provider   Date 01/05/17   Referring Provider [name] MD     Oxygen   Oxygen Continuous   Liters 3     Treadmill   MPH 1.7   Grade 0   Minutes 15   METs 2.3     NuStep   Level 1   SPM 80   Minutes 15   METs 2     REL-XR   Level 1   Speed 50   Minutes 15   METs 2     Prescription Details   Frequency (times per week) 3   Duration Progress to 45 minutes of aerobic exercise without signs/symptoms of physical distress     Intensity   THRR 40-80% of Max Heartrate 124-147   Ratings of Perceived Exertion 11-13   Perceived Dyspnea 0-4     Progression   Progression Continue to progress workloads to maintain intensity without signs/symptoms of physical distress.     Resistance Training   Training Prescription Yes   Weight 3 lbs   Reps 10-15      Exercise Goals: Frequency: Be able to perform aerobic exercise three times per week working toward 3-5 days per week.  Intensity: Work with a perceived exertion of 11 (fairly light) - 15 (hard) as tolerated. Follow your new exercise prescription and watch for changes in prescription as you progress with the program. Changes will be reviewed with you when they are made.  Duration: You should be able to do 30 minutes of continuous aerobic exercise in addition to a 5 minute warm-up and a 5 minute cool-down routine.  Nutrition Goals: Your personal nutrition goals will be established when you do  your nutrition analysis with the dietician.  The following are nutrition guidelines to follow: Cholesterol < 200mg /day Sodium < 1500mg /day Fiber: Men over 50 yrs - 30 grams per day  Personal Goals:     Personal Goals and Risk Factors at Admission - 01/05/17 1618      Core Components/Risk Factors/Patient Goals on Admission    Weight Management Yes;Weight Loss   Intervention Weight Management: Develop a combined nutrition and exercise program designed to reach desired caloric intake, while maintaining appropriate intake of nutrient and fiber, sodium and fats, and appropriate energy expenditure required for the weight goal.;Weight Management: Provide education and appropriate resources to help participant work on and attain dietary goals.   Admit Weight 191 lb 9.6 oz (86.9 kg)   Goal Weight: Short Term 186 lb (84.4 kg)   Goal Weight: Long Term 160 lb (72.6 kg)   Expected Outcomes Short Term: Continue to assess and modify interventions until short term weight is achieved;Weight Maintenance: Understanding of the daily nutrition guidelines, which includes 25-35% calories from fat, 7% or less cal from saturated fats, less than 200mg  cholesterol, less than 1.5gm of sodium, & 5 or more servings of fruits and vegetables daily;Weight Loss: Understanding of general recommendations for a balanced deficit meal plan, which  promotes 1-2 lb weight loss per week and includes a negative energy balance of [phone] kcal/d;Understanding recommendations for meals to include 15-35% energy as protein, 25-35% energy from fat, 35-60% energy from carbohydrates, less than 200mg  of dietary cholesterol, 20-35 gm of total fiber daily;Understanding of distribution of calorie intake throughout the day with the consumption of 4-5 meals/snacks   Tobacco Cessation Yes   Number of packs per day Quit 11/17; still using nicotine patches   Improve shortness of breath with ADL's Yes   Intervention Provide education, individualized  exercise plan and daily activity instruction to help decrease symptoms of SOB with activities of daily living.   Expected Outcomes Short Term: Achieves a reduction of symptoms when performing activities of daily living.   Develop more efficient breathing techniques such as purse lipped breathing and diaphragmatic breathing; and practicing self-pacing with activity Yes   Intervention Provide education, demonstration and support about specific breathing techniuqes utilized for more efficient breathing. Include techniques such as pursed lipped breathing, diaphragmatic breathing and self-pacing activity.   Expected Outcomes Short Term: Participant will be able to demonstrate and use breathing techniques as needed throughout daily activities.   Increase knowledge of respiratory medications and ability to use respiratory devices properly  Yes  Advair, Spiriva, Atrovent, Albuterol SVN and MDI; spacer given   Intervention Provide education and demonstration as needed of appropriate use of medications, inhalers, and oxygen therapy.   Expected Outcomes Short Term: Achieves understanding of medications use. Understands that oxygen is a medication prescribed by physician. Demonstrates appropriate use of inhaler and oxygen therapy.   Hypertension Yes   Intervention Provide education on lifestyle modifcations including regular physical activity/exercise, weight management, moderate sodium restriction and increased consumption of fresh fruit, vegetables, and low fat dairy, alcohol moderation, and smoking cessation.;Monitor prescription use compliance.   Expected Outcomes Short Term: Continued assessment and intervention until BP is < 140/80mm HG in hypertensive participants. < 130/29mm HG in hypertensive participants with diabetes, heart failure or chronic kidney disease.;Long Term: Maintenance of blood pressure at goal levels.      Tobacco Use Initial Evaluation: History  Smoking Status   Former Smoker    Packs/day: 0.00   Years: 40.00   Types: Cigarettes   Quit date: 05/28/2016  Smokeless Tobacco   Not on file    Comment: continues to use patches    Copy of goals given to participant.

## 2017-01-05 NOTE — Progress Notes (Signed)
Pulmonary Individual Treatment Plan  Patient Details  Name: Stanley Rios. MRN: [id number] Date of Birth: 09-04-1955 Referring Provider:     Pulmonary Rehab from 01/05/2017 in [facility] Cardiac and Pulmonary Rehab  Referring Provider  [name] MD      Initial Encounter Date:    Pulmonary Rehab from 01/05/2017 in [facility] Cardiac and Pulmonary Rehab  Date  01/05/17  Referring Provider  [name] MD      Visit Diagnosis: Chronic obstructive pulmonary disease, unspecified COPD type ([place])  Patient's Home Medications on Admission:  Current Outpatient Prescriptions:    albuterol (PROAIR HFA) 108 (90 Base) MCG/ACT inhaler, INHALE 2 PUFFS EVERY 4 HOURS AS NEEDED, Disp: , Rfl:    albuterol (PROVENTIL HFA;VENTOLIN HFA) 108 (90 Base) MCG/ACT inhaler, Inhale 2 puffs into the lungs every 4 (four) hours as needed for wheezing or shortness of breath. Reported on 10/26/2015, Disp: , Rfl:    buPROPion (WELLBUTRIN) 75 MG tablet, Take 75 mg by mouth 2 (two) times daily. Reported on 10/26/2015, Disp: , Rfl:    Cholecalciferol (VITAMIN D3) 2000 units capsule, TK 1 C PO D, Disp: , Rfl: 0   Cyanocobalamin (VITAMELTS ENERGY VITAMIN B-12) 1500 MCG TBDP, Take 1 tablet by mouth daily., Disp: 180 tablet, Rfl: 0   cyclobenzaprine (FLEXERIL) 10 MG tablet, Take 1 tablet (10 mg total) by mouth 3 (three) times daily as needed for muscle spasms., Disp: 90 tablet, Rfl: 2   diltiazem (CARDIZEM SR) 90 MG 12 hr capsule, TAKE ONE CAPSULE BY MOUTH TWICE DAILY, Disp: , Rfl:    DULoxetine (CYMBALTA) 60 MG capsule, Take 60 mg by mouth daily. Reported on 10/26/2015, Disp: , Rfl:    finasteride (PROSCAR) 5 MG tablet, Take 5 mg by mouth daily., Disp: , Rfl:    fluticasone (FLONASE) 50 MCG/ACT nasal spray, SHAKE LIQUID AND USE 2 SPRAYS IN EACH NOSTRIL DAILY, Disp: , Rfl:    Fluticasone-Salmeterol (ADVAIR DISKUS) 250-50 MCG/DOSE AEPB, Inhale into the lungs., Disp: , Rfl:    furosemide (LASIX) 20 MG tablet, Take 1  tablet (20 mg total) by mouth daily. For 1 week and then when necessary as needed for swelling and edema, Disp: 30 tablet, Rfl: 0   gabapentin (NEURONTIN) 800 MG tablet, Take 1 tablet (800 mg total) by mouth every 8 (eight) hours., Disp: 90 tablet, Rfl: 2   ipratropium (ATROVENT HFA) 17 MCG/ACT inhaler, Inhale 2 puffs into the lungs every 6 (six) hours., Disp: , Rfl:    lidocaine (XYLOCAINE) 5 % ointment, Apply 1 application topically See admin instructions. 2 to 4 times daily, Disp: , Rfl:    meloxicam (MOBIC) 15 MG tablet, Take 1 tablet (15 mg total) by mouth daily., Disp: 30 tablet, Rfl: 2   metoprolol tartrate (LOPRESSOR) 25 MG tablet, Take 1 tablet (25 mg total) by mouth 2 (two) times daily., Disp: 60 tablet, Rfl: 0   nicotine (NICODERM CQ - DOSED IN MG/24 HOURS) 21 mg/24hr patch, Place 21 mg onto the skin daily. , Disp: , Rfl:    omeprazole (PRILOSEC) 40 MG capsule, Take 40 mg by mouth 2 (two) times daily., Disp: , Rfl:    polyethylene glycol (MIRALAX / GLYCOLAX) packet, Take 17 g by mouth daily., Disp: , Rfl:    predniSONE (DELTASONE) 10 MG tablet, Take 1 tablet by mouth once., Disp: , Rfl:    simvastatin (ZOCOR) 20 MG tablet, Take 20 mg by mouth at bedtime. , Disp: , Rfl:    tamsulosin (FLOMAX) 0.4 MG CAPS  capsule, Take 0.4 mg by mouth daily., Disp: , Rfl:    tiotropium (SPIRIVA HANDIHALER) 18 MCG inhalation capsule, Place into inhaler and inhale., Disp: , Rfl:    traMADol (ULTRAM) 50 MG tablet, Take 1 tablet (50 mg total) by mouth every 6 (six) hours as needed., Disp: 120 tablet, Rfl: 2   traZODone (DESYREL) 50 MG tablet, Take 150 mg by mouth at bedtime., Disp: , Rfl:    Vitamin D, Ergocalciferol, (DRISDOL) 50000 units CAPS capsule, Take 1 capsule (50,000 Units total) by mouth 2 (two) times a week x 6 weeks., Disp: 12 capsule, Rfl: 0   zinc oxide 20 % ointment, Apply topically., Disp: , Rfl:   Past Medical History: Past Medical History:  Diagnosis Date   Anxiety     Chronic back pain    Chronic neck pain    COPD (chronic obstructive pulmonary disease) ([place])    Depression    Hyperlipidemia    Hypertension    Opiate abuse, continuous    Pneumonia     Tobacco Use: History  Smoking Status   Former Smoker   Packs/day: 0.50   Types: Cigarettes  Smokeless Tobacco   Current User    Comment: will do E-cig sometimes- patch    Labs: Recent Review Flowsheet Data    Labs for ITP Cardiac and Pulmonary Rehab Latest Ref Rng & Units 01/16/2016   PHART 7.350 - 7.450 7.44   PCO2ART 32.0 - 48.0 mmHg 42   HCO3 21.0 - 28.0 mEq/L 28.5(H)   O2SAT % 88.3       ADL UCSD:     Pulmonary Assessment Scores    Row Name 01/05/17 1612         ADL UCSD   ADL Phase Entry     SOB Score total 52     Rest 0     Walk 2     Stairs 3     Bath 3     Dress 0     Shop 3       mMRC Score   mMRC Score 1        Pulmonary Function Assessment:     Pulmonary Function Assessment - 01/05/17 1609      Pulmonary Function Tests   FVC% 27 %   FEV1% 27 %   FEV1/FVC Ratio 74.48     Breath   Bilateral Breath Sounds Clear   Shortness of Breath Yes;Fear of Shortness of Breath;Limiting activity      Exercise Target Goals: Date: 01/05/17  Exercise Program Goal: Individual exercise prescription set with THRR, safety & activity barriers. Participant demonstrates ability to understand and report RPE using BORG scale, to self-measure pulse accurately, and to acknowledge the importance of the exercise prescription.  Exercise Prescription Goal: Starting with aerobic activity 30 plus minutes a day, 3 days per week for initial exercise prescription. Provide home exercise prescription and guidelines that participant acknowledges understanding prior to discharge.  Activity Barriers & Risk Stratification:     Activity Barriers & Cardiac Risk Stratification - 01/05/17 1605      Activity Barriers & Cardiac Risk Stratification   Activity Barriers Back  Problems;Deconditioning;Muscular Weakness;Shortness of Breath;Balance Concerns      6 Minute Walk:     6 Minute Walk    Row Name 01/05/17 1601         6 Minute Walk   Phase Initial     Distance 625 feet     Walk Time 4.13 minutes     #  of Rest Breaks 2  57 sec, 55 sec     MPH 1.72     METS 2.47     RPE 15     Perceived Dyspnea  4     VO2 Peak 8.63     Symptoms Yes (comment)     Comments SOB, legs cramping     Resting HR 101 bpm     Resting BP 122/64     Max Ex. HR 114 bpm     Max Ex. BP 132/64     2 Minute Post BP 128/70       Interval HR   Baseline HR 101     1 Minute HR 104     2 Minute HR 103     3 Minute HR 108     4 Minute HR 109     5 Minute HR 112     6 Minute HR 114     2 Minute Post HR 109     Interval Heart Rate? Yes       Interval Oxygen   Interval Oxygen? Yes     Baseline Oxygen Saturation % 95 %     Baseline Liters of Oxygen 3 L     1 Minute Oxygen Saturation % 94 %     1 Minute Liters of Oxygen 3 L     2 Minute Oxygen Saturation % 94 %     2 Minute Liters of Oxygen 3 L     3 Minute Oxygen Saturation % 94 %     3 Minute Liters of Oxygen 3 L     4 Minute Oxygen Saturation % 96 %     4 Minute Liters of Oxygen 3 L     5 Minute Oxygen Saturation % 95 %     5 Minute Liters of Oxygen 3 L     6 Minute Oxygen Saturation % 95 %     6 Minute Liters of Oxygen 3 L     2 Minute Post Oxygen Saturation % 96 %     2 Minute Post Liters of Oxygen 3 L       Oxygen Initial Assessment:     Oxygen Initial Assessment - 01/05/17 1616      Home Oxygen   Home Oxygen Device Home Concentrator;E-Tanks   Sleep Oxygen Prescription Continuous   Liters per minute 3   Home Exercise Oxygen Prescription Continuous   Liters per minute 3   Home at Rest Exercise Oxygen Prescription Continuous   Liters per minute 3   Compliance with Home Oxygen Use Yes     Initial 6 min Walk   Oxygen Used Continuous;E-Tanks   Liters per minute 3   Resting Oxygen Saturation  during  6 min walk 95 %   Exercise Oxygen Saturation  during 6 min walk 94 %     Program Oxygen Prescription   Program Oxygen Prescription Continuous;E-Tanks   Liters per minute 3     Intervention   Short Term Goals To learn and understand importance of monitoring SPO2 with pulse oximeter and demonstrate accurate use of the pulse oximeter.;To learn and exhibit compliance with exercise, home and travel O2 prescription;To Learn and understand importance of maintaining oxygen saturations>88%   Long  Term Goals Exhibits compliance with exercise, home and travel O2 prescription;Maintenance of O2 saturations>88%;Verbalizes importance of monitoring SPO2 with pulse oximeter and return demonstration      Oxygen Re-Evaluation:   Oxygen Discharge (Final Oxygen Re-Evaluation):  Initial Exercise Prescription:     Initial Exercise Prescription - 01/05/17 1600      Date of Initial Exercise RX and Referring Provider   Date 01/05/17   Referring Provider [name] MD     Oxygen   Oxygen Continuous   Liters 3     Treadmill   MPH 1.7   Grade 0   Minutes 15   METs 2.3     NuStep   Level 1   SPM 80   Minutes 15   METs 2     REL-XR   Level 1   Speed 50   Minutes 15   METs 2     Prescription Details   Frequency (times per week) 3   Duration Progress to 45 minutes of aerobic exercise without signs/symptoms of physical distress     Intensity   THRR 40-80% of Max Heartrate 124-147   Ratings of Perceived Exertion 11-13   Perceived Dyspnea 0-4     Progression   Progression Continue to progress workloads to maintain intensity without signs/symptoms of physical distress.     Resistance Training   Training Prescription Yes   Weight 3 lbs   Reps 10-15      Perform Capillary Blood Glucose checks as needed.  Exercise Prescription Changes:   Exercise Comments:   Exercise Goals and Review:     Exercise Goals    Row Name 01/05/17 1607             Exercise Goals    Increase Physical Activity Yes       Intervention Provide advice, education, support and counseling about physical activity/exercise needs.;Develop an individualized exercise prescription for aerobic and resistive training based on initial evaluation findings, risk stratification, comorbidities and participant's personal goals.       Expected Outcomes Achievement of increased cardiorespiratory fitness and enhanced flexibility, muscular endurance and strength shown through measurements of functional capacity and personal statement of participant.       Increase Strength and Stamina Yes       Intervention Provide advice, education, support and counseling about physical activity/exercise needs.;Develop an individualized exercise prescription for aerobic and resistive training based on initial evaluation findings, risk stratification, comorbidities and participant's personal goals.       Expected Outcomes Achievement of increased cardiorespiratory fitness and enhanced flexibility, muscular endurance and strength shown through measurements of functional capacity and personal statement of participant.          Exercise Goals Re-Evaluation :   Discharge Exercise Prescription (Final Exercise Prescription Changes):   Nutrition:  Target Goals: Understanding of nutrition guidelines, daily intake of sodium 1500mg , cholesterol 200mg , calories 30% from fat and 7% or less from saturated fats, daily to have 5 or more servings of fruits and vegetables.  Biometrics:     Pre Biometrics - 01/05/17 1607      Pre Biometrics   Height 5' 10.9" (1.801 m)   Weight 191 lb 9.6 oz (86.9 kg)   Waist Circumference 43 inches   Hip Circumference 37 inches   Waist to Hip Ratio 1.16 %   BMI (Calculated) 26.9       Nutrition Therapy Plan and Nutrition Goals:   Nutrition Discharge: Rate Your Plate Scores:   Nutrition Goals Re-Evaluation:   Nutrition Goals Discharge (Final Nutrition Goals  Re-Evaluation):   Psychosocial: Target Goals: Acknowledge presence or absence of significant depression and/or stress, maximize coping skills, provide positive support system. Participant is able to verbalize types and ability to  use techniques and skills needed for reducing stress and depression.   Initial Review & Psychosocial Screening:     Initial Psych Review & Screening - 01/05/17 [place]? Yes   Comments Stanley Rios has good support from his wife and one son. He states he has no depression and is looking forward to increasing his stamina and energy level through his participation in [place].     Barriers   Psychosocial barriers to participate in program There are no identifiable barriers or psychosocial needs.;The patient should benefit from training in stress management and relaxation.     Screening Interventions   Interventions Encouraged to exercise      Quality of Life Scores:     Quality of Life - 01/05/17 1626      Quality of Life Scores   Health/Function Pre 19.81 %   Socioeconomic Pre 19 %   Psych/Spiritual Pre 20.14 %   Family Pre 20.8 %   GLOBAL Pre 19.86 %      PHQ-9: Recent Review Flowsheet Data    Depression screen [facility] 2/9 01/05/2017 12/31/2016 12/17/2016 12/02/2016 11/19/2016   Decreased Interest 1 0 0 0 0   Down, Depressed, Hopeless 0 0 0 0 0   PHQ - 2 Score 1 0 0 0 0   Altered sleeping 1 - - - -   Tired, decreased energy 1 - - - -   Change in appetite 0 - - - -   Feeling bad or failure about yourself  1 - - - -   Trouble concentrating 0 - - - -   Moving slowly or fidgety/restless 0 - - - -   Suicidal thoughts 0 - - - -   PHQ-9 Score 4 - - - -     Interpretation of Total Score  Total Score Depression Severity:  1-4 = Minimal depression, 5-9 = Mild depression, 10-14 = Moderate depression, 15-19 = Moderately severe depression, 20-27 = Severe depression   Psychosocial Evaluation and  Intervention:   Psychosocial Re-Evaluation:   Psychosocial Discharge (Final Psychosocial Re-Evaluation):   Education: Education Goals: Education classes will be provided on a weekly basis, covering required topics. Participant will state understanding/return demonstration of topics presented.  Learning Barriers/Preferences:     Learning Barriers/Preferences - 01/05/17 1608      Learning Barriers/Preferences   Learning Barriers None   Learning Preferences None      Education Topics: Initial Evaluation Education: - Verbal, written and demonstration of respiratory meds, RPE/PD scales, oximetry and breathing techniques. Instruction on use of nebulizers and MDIs: cleaning and proper use, rinsing mouth with steroid doses and importance of monitoring MDI activations.   Pulmonary Rehab from 01/05/2017 in [facility] Cardiac and Pulmonary Rehab  Date  01/05/17  Educator  LB  Instruction Review Code  2- meets goals/outcomes      General Nutrition Guidelines/Fats and Fiber: -Group instruction provided by verbal, written material, models and posters to present the general guidelines for heart healthy nutrition. Gives an explanation and review of dietary fats and fiber.   Controlling Sodium/Reading Food Labels: -Group verbal and written material supporting the discussion of sodium use in heart healthy nutrition. Review and explanation with models, verbal and written materials for utilization of the food label.   Exercise Physiology & Risk Factors: - Group verbal and written instruction with models to review the exercise physiology of the cardiovascular system and associated critical values. Details cardiovascular disease risk  factors and the goals associated with each risk factor.   Aerobic Exercise & Resistance Training: - Gives group verbal and written discussion on the health impact of inactivity. On the components of aerobic and resistive training programs and the benefits of this  training and how to safely progress through these programs.   Flexibility, Balance, General Exercise Guidelines: - Provides group verbal and written instruction on the benefits of flexibility and balance training programs. Provides general exercise guidelines with specific guidelines to those with heart or lung disease. Demonstration and skill practice provided.   Stress Management: - Provides group verbal and written instruction about the health risks of elevated stress, cause of high stress, and healthy ways to reduce stress.   Depression: - Provides group verbal and written instruction on the correlation between heart/lung disease and depressed mood, treatment options, and the stigmas associated with seeking treatment.   Exercise & Equipment Safety: - Individual verbal instruction and demonstration of equipment use and safety with use of the equipment.   Infection Prevention: - Provides verbal and written material to individual with discussion of infection control including proper hand washing and proper equipment cleaning during exercise session.   Falls Prevention: - Provides verbal and written material to individual with discussion of falls prevention and safety.   Pulmonary Rehab from 01/05/2017 in [facility] Cardiac and Pulmonary Rehab  Date  01/05/17  Educator  LB  Instruction Review Code  2- meets goals/outcomes      Diabetes: - Individual verbal and written instruction to review signs/symptoms of diabetes, desired ranges of glucose level fasting, after meals and with exercise. Advice that pre and post exercise glucose checks will be done for 3 sessions at entry of program.   Chronic Lung Diseases: - Group verbal and written instruction to review new updates, new respiratory medications, new advancements in procedures and treatments. Provide informative websites and "800" numbers of self-education.   Lung Procedures: - Group verbal and written instruction to describe  testing methods done to diagnose lung disease. Review the outcome of test results. Describe the treatment choices: Pulmonary Function Tests, ABGs and oximetry.   Energy Conservation: - Provide group verbal and written instruction for methods to conserve energy, plan and organize activities. Instruct on pacing techniques, use of adaptive equipment and posture/positioning to relieve shortness of breath.   Triggers: - Group verbal and written instruction to review types of environmental controls: home humidity, furnaces, filters, dust mite/pet prevention, HEPA vacuums. To discuss weather changes, air quality and the benefits of nasal washing.   Exacerbations: - Group verbal and written instruction to provide: warning signs, infection symptoms, calling MD promptly, preventive modes, and value of vaccinations. Review: effective airway clearance, coughing and/or vibration techniques. Create an Sports administrator.   Oxygen: - Individual and group verbal and written instruction on oxygen therapy. Includes supplement oxygen, available portable oxygen systems, continuous and intermittent flow rates, oxygen safety, concentrators, and Medicare reimbursement for oxygen.   Pulmonary Rehab from 01/05/2017 in [facility] Cardiac and Pulmonary Rehab  Date  01/05/17  Educator  LB  Instruction Review Code  2- meets goals/outcomes      Respiratory Medications: - Group verbal and written instruction to review medications for lung disease. Drug class, frequency, complications, importance of spacers, rinsing mouth after steroid MDI's, and proper cleaning methods for nebulizers.   Pulmonary Rehab from 01/05/2017 in [facility] Cardiac and Pulmonary Rehab  Date  01/05/17  Educator  LB  Instruction Review Code  2- meets goals/outcomes  AED/CPR: - Group verbal and written instruction with the use of models to demonstrate the basic use of the AED with the basic ABC's of resuscitation.   Breathing Retraining: - Provides  individuals verbal and written instruction on purpose, frequency, and proper technique of diaphragmatic breathing and pursed-lipped breathing. Applies individual practice skills.   Pulmonary Rehab from 01/05/2017 in [facility] Cardiac and Pulmonary Rehab  Date  01/05/17  Educator  LB  Instruction Review Code  2- meets goals/outcomes      Anatomy and Physiology of the Lungs: - Group verbal and written instruction with the use of models to provide basic lung anatomy and physiology related to function, structure and complications of lung disease.   Heart Failure: - Group verbal and written instruction on the basics of heart failure: signs/symptoms, treatments, explanation of ejection fraction, enlarged heart and cardiomyopathy.   Sleep Apnea: - Individual verbal and written instruction to review Obstructive Sleep Apnea. Review of risk factors, methods for diagnosing and types of masks and machines for OSA.   Anxiety: - Provides group, verbal and written instruction on the correlation between heart/lung disease and anxiety, treatment options, and management of anxiety.   Relaxation: - Provides group, verbal and written instruction about the benefits of relaxation for patients with heart/lung disease. Also provides patients with examples of relaxation techniques.   Knowledge Questionnaire Score:     Knowledge Questionnaire Score - 01/05/17 1609      Knowledge Questionnaire Score   Pre Score 8/10       Core Components/Risk Factors/Patient Goals at Admission:     Personal Goals and Risk Factors at Admission - 01/05/17 1618      Core Components/Risk Factors/Patient Goals on Admission    Weight Management Yes;Weight Loss   Intervention Weight Management: Develop a combined nutrition and exercise program designed to reach desired caloric intake, while maintaining appropriate intake of nutrient and fiber, sodium and fats, and appropriate energy expenditure required for the weight  goal.;Weight Management: Provide education and appropriate resources to help participant work on and attain dietary goals.   Admit Weight 191 lb 9.6 oz (86.9 kg)   Goal Weight: Short Term 186 lb (84.4 kg)   Goal Weight: Long Term 160 lb (72.6 kg)   Expected Outcomes Short Term: Continue to assess and modify interventions until short term weight is achieved;Weight Maintenance: Understanding of the daily nutrition guidelines, which includes 25-35% calories from fat, 7% or less cal from saturated fats, less than 200mg  cholesterol, less than 1.5gm of sodium, & 5 or more servings of fruits and vegetables daily;Weight Loss: Understanding of general recommendations for a balanced deficit meal plan, which promotes 1-2 lb weight loss per week and includes a negative energy balance of [phone] kcal/d;Understanding recommendations for meals to include 15-35% energy as protein, 25-35% energy from fat, 35-60% energy from carbohydrates, less than 200mg  of dietary cholesterol, 20-35 gm of total fiber daily;Understanding of distribution of calorie intake throughout the day with the consumption of 4-5 meals/snacks   Tobacco Cessation Yes   Number of packs per day Quit 11/17; still using nicotine patches   Improve shortness of breath with ADL's Yes   Intervention Provide education, individualized exercise plan and daily activity instruction to help decrease symptoms of SOB with activities of daily living.   Expected Outcomes Short Term: Achieves a reduction of symptoms when performing activities of daily living.   Develop more efficient breathing techniques such as purse lipped breathing and diaphragmatic breathing; and practicing self-pacing with activity Yes  Intervention Provide education, demonstration and support about specific breathing techniuqes utilized for more efficient breathing. Include techniques such as pursed lipped breathing, diaphragmatic breathing and self-pacing activity.   Expected Outcomes Short  Term: Participant will be able to demonstrate and use breathing techniques as needed throughout daily activities.   Increase knowledge of respiratory medications and ability to use respiratory devices properly  Yes  Advair, Spiriva, Atrovent, Albuterol SVN and MDI; spacer given   Intervention Provide education and demonstration as needed of appropriate use of medications, inhalers, and oxygen therapy.   Expected Outcomes Short Term: Achieves understanding of medications use. Understands that oxygen is a medication prescribed by physician. Demonstrates appropriate use of inhaler and oxygen therapy.   Hypertension Yes   Intervention Provide education on lifestyle modifcations including regular physical activity/exercise, weight management, moderate sodium restriction and increased consumption of fresh fruit, vegetables, and low fat dairy, alcohol moderation, and smoking cessation.;Monitor prescription use compliance.   Expected Outcomes Short Term: Continued assessment and intervention until BP is < 140/71mm HG in hypertensive participants. < 130/77mm HG in hypertensive participants with diabetes, heart failure or chronic kidney disease.;Long Term: Maintenance of blood pressure at goal levels.      Core Components/Risk Factors/Patient Goals Review:    Core Components/Risk Factors/Patient Goals at Discharge (Final Review):    ITP Comments:   Comments: Stanley Rios plans to start [name] on 01/12/17 and attend 3 days/week.

## 2017-01-06 ENCOUNTER — Ambulatory Visit: Payer: Self-pay | Admitting: *Deleted

## 2017-01-07 DIAGNOSIS — J189 Pneumonia, unspecified organism: Secondary | ICD-10-CM | POA: Diagnosis not present

## 2017-01-07 DIAGNOSIS — K458 Other specified abdominal hernia without obstruction or gangrene: Secondary | ICD-10-CM | POA: Diagnosis not present

## 2017-01-07 DIAGNOSIS — R001 Bradycardia, unspecified: Secondary | ICD-10-CM | POA: Diagnosis not present

## 2017-01-07 DIAGNOSIS — J449 Chronic obstructive pulmonary disease, unspecified: Secondary | ICD-10-CM | POA: Diagnosis not present

## 2017-01-07 DIAGNOSIS — R918 Other nonspecific abnormal finding of lung field: Secondary | ICD-10-CM | POA: Diagnosis not present

## 2017-01-12 ENCOUNTER — Telehealth: Payer: Self-pay | Admitting: *Deleted

## 2017-01-12 NOTE — Telephone Encounter (Signed)
Stanley Rios called to inform that he could not make [name] today. He states his oxygen tanks are low and need to to be filled. Stanley Rios states he really wants to be here and stated he plans to come on Wednesday.

## 2017-01-13 ENCOUNTER — Other Ambulatory Visit: Payer: Self-pay | Admitting: *Deleted

## 2017-01-13 ENCOUNTER — Encounter: Payer: Self-pay | Admitting: *Deleted

## 2017-01-13 NOTE — Patient Outreach (Addendum)
[facility]) Care Management   01/13/2017  Stanley Rios. July 04, 1956 [id number]  Stanley Rios. is an 61 y.o. male  Subjective:  Patient discussed looking forward to being involved with pulmonary rehab has attended initial visit and to begin in program this week. Patient and wife discussed recent CT scan that showed pulmonary nodule and patient to have follow up PET scan in next month. Patient has switched back [facility] pulmonary instead of [facility].   Patient reports improvement with back, shoulder next discomfort after recent treatment at pain clinic.  Patient reports he is having usual state of breathing , has some shortness of breath after walking, better after resting. Patient using duoneb at least 2 times daily and proair daily.     Objective:  BP 124/74 (BP Location: Right Arm, Patient Position: Sitting, Cuff Size: Large)   Pulse 100   Resp 20   Wt 191 lb (86.6 kg)   SpO2 96%   BMI 26.71 kg/m   Temperature in home adequately cool. Review of Systems  Constitutional: Negative.   HENT: Negative.   Eyes: Negative.   Respiratory: Positive for shortness of breath. Negative for sputum production and wheezing.   Cardiovascular: Negative.   Gastrointestinal: Negative.   Genitourinary: Negative.   Musculoskeletal: Positive for back pain. Negative for falls.  Skin: Negative.   Neurological: Negative.   Endo/Heme/Allergies: Negative.   Psychiatric/Behavioral: Positive for depression.    Physical Exam  Constitutional: He is oriented to person, place, and time. He appears well-developed and well-nourished.  Cardiovascular: Normal rate and normal heart sounds.   Respiratory: Effort normal. No respiratory distress. He has no wheezes. He has no rales.  GI: Soft. Bowel sounds are normal.  Neurological: He is alert and oriented to person, place, and time.  Skin: Skin is warm and dry.  Psychiatric: He has a normal mood and affect. His behavior is normal.  Judgment and thought content normal.    Encounter Medications:   Outpatient Encounter Prescriptions as of 01/13/2017  Medication Sig Note  . albuterol (PROVENTIL HFA;VENTOLIN HFA) 108 (90 Base) MCG/ACT inhaler Inhale 2 puffs into the lungs every 4 (four) hours as needed for wheezing or shortness of breath. Reported on 10/26/2015   . buPROPion (WELLBUTRIN) 75 MG tablet Take 75 mg by mouth 2 (two) times daily. Reported on 10/26/2015   . Cholecalciferol (VITAMIN D3) 2000 units capsule TK 1 C PO D   . Cyanocobalamin (VITAMELTS ENERGY VITAMIN B-12) 1500 MCG TBDP Take 1 tablet by mouth daily.   . cyclobenzaprine (FLEXERIL) 10 MG tablet Take 1 tablet (10 mg total) by mouth 3 (three) times daily as needed for muscle spasms.   Marland Kitchen diltiazem (CARDIZEM SR) 90 MG 12 hr capsule TAKE ONE CAPSULE BY MOUTH TWICE DAILY   . DULoxetine (CYMBALTA) 60 MG capsule Take 60 mg by mouth daily. Reported on 10/26/2015   . finasteride (PROSCAR) 5 MG tablet Take 5 mg by mouth daily.   . fluticasone (FLONASE) 50 MCG/ACT nasal spray SHAKE LIQUID AND USE 2 SPRAYS IN EACH NOSTRIL DAILY   . Fluticasone-Salmeterol (ADVAIR DISKUS) 250-50 MCG/DOSE AEPB Inhale into the lungs.   . gabapentin (NEURONTIN) 800 MG tablet Take 1 tablet (800 mg total) by mouth every 8 (eight) hours.   Marland Kitchen ipratropium-albuterol (DUONEB) 0.5-2.5 (3) MG/3ML SOLN Take 3 mLs by nebulization every 4 (four) hours as needed.   . lidocaine (XYLOCAINE) 5 % ointment Apply 1 application topically See admin instructions. 2 to 4 times daily   .  meloxicam (MOBIC) 15 MG tablet Take 1 tablet (15 mg total) by mouth daily.   . metoprolol tartrate (LOPRESSOR) 25 MG tablet Take 1 tablet (25 mg total) by mouth 2 (two) times daily.   Marland Kitchen omeprazole (PRILOSEC) 40 MG capsule Take 40 mg by mouth 2 (two) times daily.   . polyethylene glycol (MIRALAX / GLYCOLAX) packet Take 17 g by mouth daily.   . predniSONE (DELTASONE) 10 MG tablet Take 1 tablet by mouth once.   . simvastatin (ZOCOR) 20 MG  tablet Take 20 mg by mouth at bedtime.    . tamsulosin (FLOMAX) 0.4 MG CAPS capsule Take 0.4 mg by mouth daily.   Marland Kitchen tiotropium (SPIRIVA HANDIHALER) 18 MCG inhalation capsule Place into inhaler and inhale.   . traMADol (ULTRAM) 50 MG tablet Take 1 tablet (50 mg total) by mouth every 6 (six) hours as needed. 10/09/2016: DO NOT DELETE, even if Expired!!! See Dr. Adalberto Cole care coordination note.  . traZODone (DESYREL) 50 MG tablet Take 150 mg by mouth at bedtime.   . Vitamin D, Ergocalciferol, (DRISDOL) 50000 units CAPS capsule Take 1 capsule (50,000 Units total) by mouth 2 (two) times a week x 6 weeks.   Marland Kitchen albuterol (PROAIR HFA) 108 (90 Base) MCG/ACT inhaler INHALE 2 PUFFS EVERY 4 HOURS AS NEEDED   . furosemide (LASIX) 20 MG tablet Take 1 tablet (20 mg total) by mouth daily. For 1 week and then when necessary as needed for swelling and edema (Patient not taking: Reported on 01/13/2017)   . ipratropium (ATROVENT HFA) 17 MCG/ACT inhaler Inhale 2 puffs into the lungs every 6 (six) hours.   . nicotine (NICODERM CQ - DOSED IN MG/24 HOURS) 21 mg/24hr patch Place 21 mg onto the skin daily.    Marland Kitchen zinc oxide 20 % ointment Apply topically.    No facility-administered encounter medications on file as of 01/13/2017.     Functional Status:   In your present state of health, do you have any difficulty performing the following activities: 01/13/2017 11/04/2016  Hearing? N Y  Vision? N N  Difficulty concentrating or making decisions? N N  Walking or climbing stairs? Y Y  Dressing or bathing? Y Y  Doing errands, shopping? [name]  Preparing Food and eating ? Y Y  Using the Toilet? N N  In the past six months, have you accidently leaked urine? N N  Do you have problems with loss of bowel control? N N  Managing your Medications? Y Y  Managing your Finances? [name]  Housekeeping or managing your Housekeeping? Y Y  Some recent data might be hidden    Fall/Depression Screening:    Fall Risk  01/13/2017 01/05/2017  12/31/2016  Falls in the past year? No No No  Risk for fall due to : Impaired mobility - -  Risk for fall due to (comments): uses walker at times  - -   PHQ 2/9 Scores 01/13/2017 01/05/2017 12/31/2016 12/17/2016 12/02/2016 11/19/2016 11/03/2016  PHQ - 2 Score 4 1 0 0 0 0 0  PHQ- 9 Score 7 4 - - - - -    Assessment: Routine home visit   COPD Patient will benefit from pulmonary rehab program he is beginning.   follow up with [facility] regarding pulmonary nodule in next month appointment for test and Pulmonary office visit .  Taking medications as prescribed Able to identify worsening symptoms of COPD, and state  action plan   Chronic pain  Improved relief after recent procedure  at pain clinic   Patient and wife agrees that community care needs have been met at this time, no new concerns for community needs  at this time. Patient has not experienced hospital admission in the last 90 days.  Patient will continue to follow up all medical appointments,  wife will provide transportation or patient mother has been available.  Caregiver has my and [facility] contact information if future needs arise, expressed appreciation for services.   Plan:  Will close case to care management Will send MD closure letter  Will send in basket message to [place] regarding case closure . Provided new incentive spirometry per wife request patient current one is broken.     [name], RN, [place] Management [phone]- Mobile [phone]- Toll Free Main Office

## 2017-01-14 ENCOUNTER — Encounter: Payer: Self-pay | Admitting: *Deleted

## 2017-01-14 DIAGNOSIS — J449 Chronic obstructive pulmonary disease, unspecified: Secondary | ICD-10-CM

## 2017-01-14 NOTE — Progress Notes (Signed)
Daily Session Note  Patient Details  Name: Stanley Rios. MRN: [id number] Date of Birth: 04-04-1956 Referring Provider:     Pulmonary Rehab from 01/05/2017 in [facility] Cardiac and Pulmonary Rehab  Referring Provider  [name] MD      Encounter Date: 01/14/2017  Check In:     Session Check In - 01/14/17 1146      Check-In   Location [facility]-Cardiac & Pulmonary Rehab   Staff Present Carson Myrtle, BS, RRT, Respiratory Therapist;Isidoro Santillana Sherryll Burger, RN BSN;Joseph Darrin Nipper, [place], ACSM RCEP, Exercise Physiologist   Supervising physician immediately available to respond to emergencies [name] immediately available ER MD   Physician(s) Dr. Alfred Levins and Jimmye Norman    Medication changes reported     No   Fall or balance concerns reported    No   Warm-up and Cool-down Performed as group-led instruction   Resistance Training Performed Yes   VAD Patient? No     Pain Assessment   Currently in Pain? No/denies         History  Smoking Status  . Former Smoker  . Packs/day: 0.00  . Years: 40.00  . Types: Cigarettes  . Quit date: 05/28/2016  Smokeless Tobacco  . Former Systems developer    Comment: continues to use patches    Goals Met:  Proper associated with RPD/PD & O2 Sat Independence with exercise equipment Using PLB without cueing & demonstrates good technique Exercise tolerated well Strength training completed today  Goals Unmet:  Not Applicable  Comments:First full day of exercise!  Patient was oriented to gym and equipment including functions, settings, policies, and procedures.  Patient's individual exercise prescription and treatment plan were reviewed.  All starting workloads were established based on the results of the 6 minute walk test done at initial orientation visit.  The plan for exercise progression was also introduced and progression will be customized based on patient's performance and goals.    Dr. Emily Filbert is Medical Director for  [place] and [name]monary Rehabilitation.

## 2017-01-16 ENCOUNTER — Encounter: Payer: PPO | Admitting: *Deleted

## 2017-01-16 DIAGNOSIS — J449 Chronic obstructive pulmonary disease, unspecified: Secondary | ICD-10-CM | POA: Diagnosis not present

## 2017-01-16 NOTE — Progress Notes (Signed)
Daily Session Note  Patient Details  Name: Stanley Rios. MRN: [id number] Date of Birth: 05-25-1956 Referring Provider:     Pulmonary Rehab from 01/05/2017 in [facility] Cardiac and Pulmonary Rehab  Referring Provider  [name] MD      Encounter Date: 01/16/2017  Check In:     Session Check In - 01/16/17 1236      Check-In   Location [facility]-Cardiac & Pulmonary Rehab   Staff Present Heath Lark, RN, BSN, CCRP;Joseph Darrin Nipper, [place], ACSM RCEP, Exercise Physiologist;Deren Degrazia Sherryll Burger, RN BSN   Supervising physician immediately available to respond to emergencies [name] immediately available ER MD   Physician(s) Dr. Jacqualine Code and Reita Cliche   Medication changes reported     No   Fall or balance concerns reported    No   Warm-up and Cool-down Performed as group-led instruction   Resistance Training Performed Yes   VAD Patient? No     Pain Assessment   Currently in Pain? No/denies         History  Smoking Status  . Former Smoker  . Packs/day: 0.00  . Years: 40.00  . Types: Cigarettes  . Quit date: 05/28/2016  Smokeless Tobacco  . Former Systems developer    Comment: continues to use patches    Goals Met:  Proper associated with RPD/PD & O2 Sat Independence with exercise equipment Using PLB without cueing & demonstrates good technique Exercise tolerated well Strength training completed today  Goals Unmet:  Not Applicable  Comments: Pt able to follow exercise prescription today without complaint.  Will continue to monitor for progression.    Dr. Emily Filbert is Medical Director for [place] and [name].

## 2017-01-22 ENCOUNTER — Encounter: Payer: Self-pay | Admitting: Pain Medicine

## 2017-01-22 ENCOUNTER — Ambulatory Visit: Payer: PPO | Admitting: Nurse Practitioner

## 2017-01-22 ENCOUNTER — Ambulatory Visit: Payer: PPO | Attending: Nurse Practitioner | Admitting: Pain Medicine

## 2017-01-22 ENCOUNTER — Other Ambulatory Visit: Payer: Self-pay | Admitting: Pain Medicine

## 2017-01-22 VITALS — BP 128/97 | HR 101 | Temp 97.5°F | Resp 18 | Ht 71.0 in | Wt 179.0 lb

## 2017-01-22 DIAGNOSIS — K219 Gastro-esophageal reflux disease without esophagitis: Secondary | ICD-10-CM | POA: Insufficient documentation

## 2017-01-22 DIAGNOSIS — Z79891 Long term (current) use of opiate analgesic: Secondary | ICD-10-CM | POA: Insufficient documentation

## 2017-01-22 DIAGNOSIS — M25511 Pain in right shoulder: Secondary | ICD-10-CM | POA: Insufficient documentation

## 2017-01-22 DIAGNOSIS — F329 Major depressive disorder, single episode, unspecified: Secondary | ICD-10-CM | POA: Diagnosis not present

## 2017-01-22 DIAGNOSIS — M791 Myalgia: Secondary | ICD-10-CM | POA: Diagnosis not present

## 2017-01-22 DIAGNOSIS — M25512 Pain in left shoulder: Secondary | ICD-10-CM | POA: Insufficient documentation

## 2017-01-22 DIAGNOSIS — M47816 Spondylosis without myelopathy or radiculopathy, lumbar region: Secondary | ICD-10-CM

## 2017-01-22 DIAGNOSIS — M4802 Spinal stenosis, cervical region: Secondary | ICD-10-CM | POA: Insufficient documentation

## 2017-01-22 DIAGNOSIS — F419 Anxiety disorder, unspecified: Secondary | ICD-10-CM | POA: Insufficient documentation

## 2017-01-22 DIAGNOSIS — Z87891 Personal history of nicotine dependence: Secondary | ICD-10-CM | POA: Diagnosis not present

## 2017-01-22 DIAGNOSIS — E785 Hyperlipidemia, unspecified: Secondary | ICD-10-CM | POA: Insufficient documentation

## 2017-01-22 DIAGNOSIS — M159 Polyosteoarthritis, unspecified: Secondary | ICD-10-CM

## 2017-01-22 DIAGNOSIS — L821 Other seborrheic keratosis: Secondary | ICD-10-CM | POA: Diagnosis not present

## 2017-01-22 DIAGNOSIS — I10 Essential (primary) hypertension: Secondary | ICD-10-CM | POA: Insufficient documentation

## 2017-01-22 DIAGNOSIS — F1190 Opioid use, unspecified, uncomplicated: Secondary | ICD-10-CM | POA: Diagnosis not present

## 2017-01-22 DIAGNOSIS — G894 Chronic pain syndrome: Secondary | ICD-10-CM | POA: Diagnosis not present

## 2017-01-22 DIAGNOSIS — G4700 Insomnia, unspecified: Secondary | ICD-10-CM | POA: Insufficient documentation

## 2017-01-22 DIAGNOSIS — G8929 Other chronic pain: Secondary | ICD-10-CM | POA: Diagnosis not present

## 2017-01-22 DIAGNOSIS — M792 Neuralgia and neuritis, unspecified: Secondary | ICD-10-CM | POA: Diagnosis not present

## 2017-01-22 DIAGNOSIS — M7918 Myalgia, other site: Secondary | ICD-10-CM

## 2017-01-22 DIAGNOSIS — M545 Low back pain: Secondary | ICD-10-CM | POA: Diagnosis not present

## 2017-01-22 DIAGNOSIS — M5136 Other intervertebral disc degeneration, lumbar region: Secondary | ICD-10-CM | POA: Insufficient documentation

## 2017-01-22 DIAGNOSIS — M1990 Unspecified osteoarthritis, unspecified site: Secondary | ICD-10-CM | POA: Diagnosis not present

## 2017-01-22 DIAGNOSIS — E538 Deficiency of other specified B group vitamins: Secondary | ICD-10-CM | POA: Insufficient documentation

## 2017-01-22 DIAGNOSIS — M961 Postlaminectomy syndrome, not elsewhere classified: Secondary | ICD-10-CM | POA: Diagnosis not present

## 2017-01-22 DIAGNOSIS — J449 Chronic obstructive pulmonary disease, unspecified: Secondary | ICD-10-CM | POA: Insufficient documentation

## 2017-01-22 DIAGNOSIS — E559 Vitamin D deficiency, unspecified: Secondary | ICD-10-CM | POA: Diagnosis not present

## 2017-01-22 DIAGNOSIS — G588 Other specified mononeuropathies: Secondary | ICD-10-CM | POA: Insufficient documentation

## 2017-01-22 DIAGNOSIS — Z981 Arthrodesis status: Secondary | ICD-10-CM | POA: Diagnosis not present

## 2017-01-22 DIAGNOSIS — M533 Sacrococcygeal disorders, not elsewhere classified: Secondary | ICD-10-CM | POA: Insufficient documentation

## 2017-01-22 DIAGNOSIS — M4696 Unspecified inflammatory spondylopathy, lumbar region: Secondary | ICD-10-CM | POA: Diagnosis not present

## 2017-01-22 DIAGNOSIS — M542 Cervicalgia: Secondary | ICD-10-CM | POA: Diagnosis not present

## 2017-01-22 DIAGNOSIS — M150 Primary generalized (osteo)arthritis: Principal | ICD-10-CM

## 2017-01-22 MED ORDER — CYANOCOBALAMIN 1500 MCG PO TBDP: 1.0000 | ORAL_TABLET | Freq: Every day | ORAL | 0 refills | Status: AC

## 2017-01-22 MED ORDER — CYCLOBENZAPRINE HCL 10 MG PO TABS: 10.0000 mg | ORAL_TABLET | Freq: Three times a day (TID) | ORAL | 2 refills | Status: DC | PRN

## 2017-01-22 MED ORDER — TRAMADOL HCL 50 MG PO TABS: 50.0000 mg | ORAL_TABLET | Freq: Four times a day (QID) | ORAL | 2 refills | Status: DC | PRN

## 2017-01-22 MED ORDER — MELOXICAM 15 MG PO TABS: 15.0000 mg | ORAL_TABLET | Freq: Every day | ORAL | 2 refills | Status: DC

## 2017-01-22 MED ORDER — GABAPENTIN 800 MG PO TABS: 800.0000 mg | ORAL_TABLET | Freq: Three times a day (TID) | ORAL | 2 refills | Status: DC

## 2017-01-22 NOTE — Patient Instructions (Addendum)
___________You have been given a script for Tramadol today.  You have been given pre procedure instructions for a Radiofrequency. You have prescriptions to get picked up at the pharmacy. _________________________________________________________________________________  Preparing for Procedure with Sedation Instructions: . Oral Intake: Do not eat or drink anything for at least 8 hours prior to your procedure. . Transportation: Public transportation is not allowed. Bring an adult driver. The driver must be physically present in our waiting room before any procedure can be started. Marland Kitchen Physical Assistance: Bring an adult physically capable of assisting you, in the event you need help. This adult should keep you company at home for at least 6 hours after the procedure. . Blood Pressure Medicine: Take your blood pressure medicine with a sip of water the morning of the procedure. . Blood thinners:  . Diabetics on insulin: Notify the staff so that you can be scheduled 1st case in the morning. If your diabetes requires high dose insulin, take only  of your normal insulin dose the morning of the procedure and notify the staff that you have done so. . Preventing infections: Shower with an antibacterial soap the morning of your procedure. . Build-up your immune system: Take 1000 mg of Vitamin C with every meal (3 times a day) the day prior to your procedure. Marland Kitchen Antibiotics: Inform the staff if you have a condition or reason that requires you to take antibiotics before dental procedures. . Pregnancy: If you are pregnant, call and cancel the procedure. . Sickness: If you have a cold, fever, or any active infections, call and cancel the procedure. . Arrival: You must be in the facility at least 30 minutes prior to your scheduled procedure. . Children: Do not bring children with you. . Dress appropriately: Bring dark clothing that you would not mind if they get stained. . Valuables: Do not bring any jewelry or  valuables. Procedure appointments are reserved for interventional treatments only. Marland Kitchen No Prescription Refills. . No medication changes will be discussed during procedure appointments. . No disability issues will be discussed. ____________________________________________________________________________________________  Preparing for Procedure with Sedation Instructions: . Oral Intake: Do not eat or drink anything for at least 8 hours prior to your procedure. . Transportation: Public transportation is not allowed. Bring an adult driver. The driver must be physically present in our waiting room before any procedure can be started. Marland Kitchen Physical Assistance: Bring an adult capable of physically assisting you, in the event you need help. . Blood Pressure Medicine: Take your blood pressure medicine with a sip of water the morning of the procedure. . Insulin: Take only  of your normal insulin dose. . Preventing infections: Shower with an antibacterial soap the morning of your procedure. . Build-up your immune system: Take 1000 mg of Vitamin C with every meal (3 times a day) the day prior to your procedure. . Pregnancy: If you are pregnant, call and cancel the procedure. . Sickness: If you have a cold, fever, or any active infections, call and cancel the procedure. . Arrival: You must be in the facility at least 30 minutes prior to your scheduled procedure. . Children: Do not bring children with you. . Dress appropriately: Bring dark clothing that you would not mind if they get stained. . Valuables: Do not bring any jewelry or valuables. Procedure appointments are reserved for interventional treatments only. Marland Kitchen No Prescription Refills. . No medication changes will be discussed during procedure appointments. No disability issues will be discussed.Radiofrequency Lesioning Radiofrequency lesioning is a procedure that is  performed to relieve pain. The procedure is often used for back, neck, or arm pain.  Radiofrequency lesioning involves the use of a machine that creates radio waves to make heat. During the procedure, the heat is applied to the nerve that carries the pain signal. The heat damages the nerve and interferes with the pain signal. Pain relief usually starts about 2 weeks after the procedure and lasts for 6 months to 1 year. Tell a health care provider about:  Any allergies you have.  All medicines you are taking, including vitamins, herbs, eye drops, creams, and over-the-counter medicines.  Any problems you or family members have had with anesthetic medicines.  Any blood disorders you have.  Any surgeries you have had.  Any medical conditions you have.  Whether you are pregnant or may be pregnant. What are the risks? Generally, this is a safe procedure. However, problems may occur, including:  Pain or soreness at the injection site.  Infection at the injection site.  Damage to nerves or blood vessels.  What happens before the procedure?  Ask your health care provider about: ? Changing or stopping your regular medicines. This is especially important if you are taking diabetes medicines or blood thinners. ? Taking medicines such as aspirin and ibuprofen. These medicines can thin your blood. Do not take these medicines before your procedure if your health care provider instructs you not to.  Follow instructions from your health care provider about eating or drinking restrictions.  Plan to have someone take you home after the procedure.  If you go home right after the procedure, plan to have someone with you for 24 hours. What happens during the procedure?  You will be given one or more of the following: ? A medicine to help you relax (sedative). ? A medicine to numb the area (local anesthetic).  You will be awake during the procedure. You will need to be able to talk with the health care provider during the procedure.  With the help of a type of X-ray  (fluoroscopy), the health care provider will insert a radiofrequency needle into the area to be treated.  Next, a wire that carries the radio waves (electrode) will be put through the radiofrequency needle. An electrical pulse will be sent through the electrode to verify the correct nerve. You will feel a tingling sensation, and you may have muscle twitching.  Then, the tissue that is around the needle tip will be heated by an electric current that is passed using the radiofrequency machine. This will numb the nerves.  A bandage (dressing) will be put on the insertion area after the procedure is done. The procedure may vary among health care providers and hospitals. What happens after the procedure?  Your blood pressure, heart rate, breathing rate, and blood oxygen level will be monitored often until the medicines you were given have worn off.  Return to your normal activities as directed by your health care provider. This information is not intended to replace advice given to you by your health care provider. Make sure you discuss any questions you have with your health care provider. Document Released: 03/12/2011 Document Revised: 12/20/2015 Document Reviewed: 08/21/2014 Elsevier Interactive Patient Education  [employer].

## 2017-01-22 NOTE — Progress Notes (Signed)
Nursing Pain Medication Assessment:  Safety precautions to be maintained throughout the outpatient stay will include: orient to surroundings, keep bed in low position, maintain call bell within reach at all times, provide assistance with transfer out of bed and ambulation.  Medication Inspection Compliance: Pill count conducted under aseptic conditions, in front of the patient. Neither the pills nor the bottle was removed from the patient's sight at any time. Once count was completed pills were immediately returned to the patient in their original bottle.  Medication: Tramadol (Ultram) Pill/Patch Count: 21 of 120 pills remain Pill/Patch Appearance: Markings consistent with prescribed medication Bottle Appearance: Standard pharmacy container. Clearly labeled. Filled Date: 05 / 31 / 2018 Last Medication intake:  Yesterday

## 2017-01-22 NOTE — Progress Notes (Signed)
Patient's Name: Stanley Rios.  MRN: [id number]  Referring Provider: Sharilyn Sites, MD  DOB: 12-09-1955  PCP: Sharilyn Sites, MD  DOS: 01/22/2017  Note by: Sydnee Levans. Laban Emperor, MD  Service setting: Ambulatory outpatient  Specialty: Interventional Pain Management  Location: [facility] (AMB) Pain Management Facility    Patient type: Established   Primary Reason(s) for Visit: Encounter for prescription drug management & post-procedure evaluation of chronic illness with mild to moderate exacerbation(Level of risk: moderate) CC: Neck Pain; Shoulder Pain (bilateral); and Back Pain (low)  HPI  Stanley Rios is a 61 y.o. year old, male patient, who comes today for a post-procedure evaluation and medication management. He has Opiate withdrawal (HCC); COPD (chronic obstructive pulmonary disease) (HCC); HTN (hypertension); Depression; Anxiety; Neutrophilic leukocytosis; DDD (degenerative disc disease), lumbar; Lumbar facet syndrome (Bilateral) (R>L); Sacroiliac joint pain (Bilateral) (R>L); DDD (degenerative disc disease), cervical; Cervical facet syndrome (HCC); Pressure injury of skin; Chronic airway obstruction (HCC); Chronic pain syndrome; Depressive disorder; Dry eye syndrome; Esophageal reflux; Hypertension; Insomnia; Irritable bowel syndrome (IBS); Chronic low back pain (Location of Primary Source of Pain) (Bilateral) (R>L); Posterior subcapsular cataract, bilateral; Cervical post-laminectomy syndrome; Pulmonary emphysema (HCC); Seborrheic keratoses; Spinal stenosis in cervical region; Testosterone deficiency; Tobacco use disorder; Umbilical hernia; Vitamin D deficiency; Vitreomacular adhesion of right eye; Long term current use of opiate analgesic; Opiate use; Long term prescription opiate use; Chronic hip pain; Chronic shoulder pain (Location of Tertiary source of pain) (Bilateral) (R>L); Lower extremity weakness; Chronic neck pain (Location of Secondary source of pain) (Bilateral) (R>L); Chronic lower  extremity pain (Bilateral) (R>L); B12 deficiency; Osteoarthritis; Neurogenic pain; Musculoskeletal pain; Diaphragmatic hernia; Chronic hypoxemic respiratory failure (HCC); Chronic narcotic use; Intercostal neuropathic pain (Left); Post-thoracotomy pain syndrome (Left); Intercostal neuralgia (Left); [facility]); and Chronic chest wall pain (Left) on his problem list. His primarily concern today is the Neck Pain; Shoulder Pain (bilateral); and Back Pain (low)  Pain Assessment: Location: Other (Comment) (neck shoulders, low back) Neck (shoulders and low back also) Radiating: denies Onset: More than a month ago Duration: Chronic pain Quality: Aching Severity: 1 /10 (self-reported pain score)  Note: Reported level is compatible with observation.                   Effect on ADL: The pain makes it more difficult to lift Timing: Constant Modifying factors: medications  Stanley Rios was last seen on 01/22/2017 for a procedure. During today's appointment we reviewed Stanley Rios post-procedure results, as well as his outpatient medication regimen.  Further details on both, my assessment(s), as well as the proposed treatment plan, please see below.  Controlled Substance Pharmacotherapy Assessment REMS (Risk Evaluation and Mitigation Strategy)  Analgesic: Tramadol 50 mg 1 tablet every 6 hours (200 mg/day of tramadol) MME/day: 20 mg/day.  Rickey Barbara, RN  01/22/2017 11:26 AM  Sign at close encounter Nursing Pain Medication Assessment:  Safety precautions to be maintained throughout the outpatient stay will include: orient to surroundings, keep bed in low position, maintain call bell within reach at all times, provide assistance with transfer out of bed and ambulation.  Medication Inspection Compliance: Pill count conducted under aseptic conditions, in front of the patient. Neither the pills nor the bottle was removed from the patient's sight at any time. Once count was completed pills were  immediately returned to the patient in their original bottle.  Medication: Tramadol (Ultram) Pill/Patch Count: 21 of 120 pills remain Pill/Patch Appearance: Markings consistent with prescribed medication Bottle Appearance: Standard pharmacy container.  Clearly labeled. Filled Date: 05 / 31 / 2018 Last Medication intake:  Yesterday   Pharmacokinetics: Liberation and absorption (onset of action): WNL Distribution (time to peak effect): WNL Metabolism and excretion (duration of action): WNL         Pharmacodynamics: Desired effects: Analgesia: Stanley Rios reports >50% benefit. Functional ability: Patient reports that medication allows him to accomplish basic ADLs Clinically meaningful improvement in function (CMIF): Sustained CMIF goals met Perceived effectiveness: Described as relatively effective, allowing for increase in activities of daily living (ADL) Undesirable effects: Side-effects or Adverse reactions: None reported Monitoring: [place] PMP: Online review of the past 49-monthperiod conducted. Compliant with practice rules and regulations List of all UDS test(s) done:  Lab Results  Component Value Date   TOXASSSELUR FINAL 10/09/2016   [place]FINAL 03/27/2016   SUMMARY FINAL 08/05/2016   Last UDS on record: ToxAssure Select 13  Date Value Ref Range Status  10/09/2016 FINAL  Final    Comment:    ==================================================================== TOXASSURE SELECT 13 (MW) ==================================================================== Test                             Result       Flag       Units Drug Present and Declared for Prescription Verification   Tramadol                       PRESENT      EXPECTED   O-Desmethyltramadol            PRESENT      EXPECTED   N-Desmethyltramadol            PRESENT      EXPECTED    Source of tramadol is a prescription medication.    O-desmethyltramadol and N-desmethyltramadol are expected    metabolites of  tramadol. ==================================================================== Test                      Result    Flag   Units      Ref Range   Creatinine              124              mg/dL      >=20 ==================================================================== Declared Medications:  The flagging and interpretation on this report are based on the  following declared medications.  Unexpected results may arise from  inaccuracies in the declared medications.  **Note: The testing scope of this panel includes these medications:  Tramadol (Ultram)  **Note: The testing scope of this panel does not include following  reported medications:  Albuterol  Albuterol (Duoneb)  Bupropion (Wellbutrin)  Cyanocobalamin  Cyclobenzaprine (Flexeril)  Diltiazem (Cardizem)  Duloxetine (Cymbalta)  Enalapril (Vasotec)  Finasteride (Proscar)  Fluticasone (Advair)  Fluticasone (Flonase)  Furosemide (Lasix)  Gabapentin (Neurontin)  Ipratropium (Atrovent)  Ipratropium (Duoneb)  Lidocaine  Meloxicam (Mobic)  Metoprolol (Lopressor)  Nicotine  Omeprazole (Prilosec)  Polyethylene Glycol  Potassium  Prednisone (Deltasone)  Salmeterol (Advair)  Simvastatin (Zocor)  Tamsulosin (Flomax)  Tiotropium (Spiriva)  Trazodone (Desyrel)  Vitamin B12  Vitamin D2 (Drisdol)  Vitamin D3  Zinc ==================================================================== For clinical consultation, please call [phone] ====================================================================    Summary  Date Value Ref Range Status  08/05/2016 FINAL  Final    Comment:    ==================================================================== TOXASSURE COMP DRUG ANALYSIS,UR ==================================================================== Test  Result       Flag       Units Drug Present and Declared for Prescription Verification   Gabapentin                     PRESENT       EXPECTED   Cyclobenzaprine                PRESENT      EXPECTED   Bupropion                      PRESENT      EXPECTED   Duloxetine                     PRESENT      EXPECTED   Trazodone                      PRESENT      EXPECTED   1,3 chlorophenyl piperazine    PRESENT      EXPECTED    1,3-chlorophenyl piperazine is an expected metabolite of    trazodone.   Naproxen                       PRESENT      EXPECTED   Diltiazem                      PRESENT      EXPECTED   Metoprolol                     PRESENT      EXPECTED Drug Present not Declared for Prescription Verification   Tramadol                       PRESENT      UNEXPECTED   O-Desmethyltramadol            PRESENT      UNEXPECTED   N-Desmethyltramadol            PRESENT      UNEXPECTED    Source of tramadol is a prescription medication.    O-desmethyltramadol and N-desmethyltramadol are expected    metabolites of tramadol.   Methocarbamol                  PRESENT      UNEXPECTED   Acetaminophen                  PRESENT      UNEXPECTED   Ibuprofen                      PRESENT      UNEXPECTED Drug Absent but Declared for Prescription Verification   Baclofen                       Not Detected UNEXPECTED   Lidocaine                      Not Detected UNEXPECTED    Lidocaine, as indicated in the declared medication list, is not    always detected even when used as directed. ==================================================================== Test                      Result    Flag   Units  Ref Range   Creatinine              200              mg/dL      >=20 ==================================================================== Declared Medications:  The flagging and interpretation on this report are based on the  following declared medications.  Unexpected results may arise from  inaccuracies in the declared medications.  **Note: The testing scope of this panel includes these medications:  Baclofen  Bupropion (Wellbutrin)   Cyclobenzaprine (Flexeril)  Diltiazem  Duloxetine (Cymbalta)  Gabapentin  Metoprolol (Lopressor)  Naproxen  Trazodone  **Note: The testing scope of this panel does not include small to  moderate amounts of these reported medications:  Lidocaine  **Note: The testing scope of this panel does not include following  reported medications:  Albuterol  Albuterol (Duoneb)  Celecoxib (Celebrex)  Enalapril (Vasotec)  Finasteride (Proscar)  Fluticasone (Advair)  Fluticasone (Flonase)  Furosemide (Lasix)  Ipratropium (Atrovent)  Ipratropium (Duoneb)  Naloxone (Narcan)  Nicotine  Omeprazole  Polyethylene Glycol  Potassium (K-Dur)  Potassium (Klor-Con)  Prednisone (Deltasone)  Salmeterol (Advair)  Simvastatin (Zocor)  Tamsulosin (Flomax)  Tiotropium (Spiriva)  Vitamin D2 (Ergocalciferol) ==================================================================== For clinical consultation, please call [phone]. ====================================================================    UDS interpretation: Compliant          Medication Assessment Form: Reviewed. Patient indicates being compliant with therapy Treatment compliance: Compliant Risk Assessment Profile: Aberrant behavior: See prior evaluations. None observed or detected today Comorbid factors increasing risk of overdose: See prior notes. No additional risks detected today Risk of substance use disorder (SUD): Low Opioid Risk Tool (ORT) Total Score:    Interpretation Table:  Score <3 = Low Risk for SUD  Score between 4-7 = Moderate Risk for SUD  Score >8 = High Risk for Opioid Abuse   Risk Mitigation Strategies:  Patient Counseling: Covered Patient-Prescriber Agreement (PPA): Present and active  Notification to other healthcare providers: Done  Pharmacologic Plan: No change in therapy, at this time  Post-Procedure Assessment  01/22/2017 Procedure: Diagnostic Bilateral Lumbar Facet Block #2, under fluoroscopy & IV  sedation Pre-procedure pain score:  3/10 Post-procedure pain score: 0/10 (100% relief) Influential Factors: BMI: 24.97 kg/m Intra-procedural challenges: None observed Assessment challenges: None detected         Post-procedural adverse reactions or complications: None reported Reported side-effects: None  Sedation: Sedation provided. When no sedatives are used, the analgesic levels obtained are directly associated to the effectiveness of the local anesthetics. However, when sedation is provided, the level of analgesia obtained during the initial 1 hour following the intervention, is believed to be the result of a combination of factors. These factors may include, but are not limited to: 1. The effectiveness of the local anesthetics used. 2. The effects of the analgesic(s) and/or anxiolytic(s) used. 3. The degree of discomfort experienced by the patient at the time of the procedure. 4. The patients ability and reliability in recalling and recording the events. 5. The presence and influence of possible secondary gains and/or psychosocial factors. Reported result: Relief experienced during the 1st hour after the procedure: 60 % (Ultra-Short Term Relief) Interpretative annotation: Analgesia during this period is likely to be Local Anesthetic and/or IV Sedative (Analgesic/Anxiolitic) related.          Effects of local anesthetic: The analgesic effects attained during this period are directly associated to the localized infiltration of local anesthetics and therefore cary significant diagnostic value as to the etiological location, or anatomical origin, of  the pain. Expected duration of relief is directly dependent on the pharmacodynamics of the local anesthetic used. Long-acting (4-6 hours) anesthetics used.  Reported result: Relief during the next 4 to 6 hour after the procedure: 60 % (Short-Term Relief) Interpretative annotation: Complete relief would suggest area to be the source of the pain.           Long-term benefit: Defined as the period of time past the expected duration of local anesthetics. With the possible exception of prolonged sympathetic blockade from the local anesthetics, benefits during this period are typically attributed to, or associated with, other factors such as analgesic sensory neuropraxia, antiinflammatory effects, or beneficial biochemical changes provided by agents other than the local anesthetics Reported result: Extended relief following procedure: 100 % (for 5 days then 30%) (Long-Term Relief) Interpretative annotation: Good relief. This could suggest inflammation to be a significant component in the etiology to the pain.          Current benefits: Defined as persistent relief that continues at this point in time.   Reported results: Treated area: <50 %       Interpretative annotation: Partial benefit. This would suggest further treatment needed  Interpretation: Results would suggest a successful diagnostic intervention. The patient has failed to respond to conservative therapies including over-the-counter medications, anti-inflammatories, muscle relaxants, membrane stabilizers, opioids, physical therapy, modalities such as heat and ice, as well as more invasive techniques such as nerve blocks. Because Stanley Rios did attain more than 50% relief of the pain during a series of diagnostic blocks conducted in separate occasions, I believe it is medically necessary to proceed with Radiofrequency Ablation, in order to attempt gaining longer relief.  Laboratory Chemistry  Inflammation Markers Lab Results  Component Value Date   CRP <0.8 08/07/2016   ESRSEDRATE 24 (H) 08/07/2016   (CRP: Acute Phase) (ESR: Chronic Phase) Renal Function Markers Lab Results  Component Value Date   BUN 12 08/07/2016   CREATININE 0.90 08/07/2016   GFRAA >60 08/07/2016   GFRNONAA >60 08/07/2016   Hepatic Function Markers Lab Results  Component Value Date   AST 20 08/07/2016    ALT 16 (L) 08/07/2016   ALBUMIN 3.6 08/07/2016   ALKPHOS 80 08/07/2016   Electrolytes Lab Results  Component Value Date   NA 142 08/07/2016   K 3.6 08/07/2016   CL 108 08/07/2016   CALCIUM 8.9 08/07/2016   MG 1.7 08/07/2016   Neuropathy Markers Lab Results  Component Value Date   VITAMINB12 176 (L) 08/07/2016   Bone Pathology Markers Lab Results  Component Value Date   ALKPHOS 80 08/07/2016   25OHVITD1 27 (L) 08/07/2016   25OHVITD2 20 08/07/2016   25OHVITD3 7.0 08/07/2016   CALCIUM 8.9 08/07/2016   Coagulation Parameters Lab Results  Component Value Date   INR 0.83 04/11/2016   LABPROT 11.4 04/11/2016   PLT 318 04/18/2016   Cardiovascular Markers Lab Results  Component Value Date   HGB 12.5 (L) 04/18/2016   HCT 36.8 (L) 04/18/2016   Note: Lab results reviewed.  Recent Diagnostic Imaging Review  Dg C-arm 1-60 Min-no Report  Result Date: 12/31/2016 Fluoroscopy was utilized by the requesting physician.  No radiographic interpretation.   Note: Imaging results reviewed.          Meds   Current Outpatient Prescriptions (Endocrine & Metabolic):  .  predniSONE (DELTASONE) 10 MG tablet, Take 1 tablet by mouth once.  Current Outpatient Prescriptions (Cardiovascular):  .  diltiazem (CARDIZEM SR) 90 MG 12 hr  capsule, TAKE ONE CAPSULE BY MOUTH TWICE DAILY .  furosemide (LASIX) 20 MG tablet, Take 1 tablet (20 mg total) by mouth daily. For 1 week and then when necessary as needed for swelling and edema .  metoprolol tartrate (LOPRESSOR) 25 MG tablet, Take 1 tablet (25 mg total) by mouth 2 (two) times daily. .  simvastatin (ZOCOR) 20 MG tablet, Take 20 mg by mouth at bedtime.   Current Outpatient Prescriptions (Respiratory):  .  albuterol (PROAIR HFA) 108 (90 Base) MCG/ACT inhaler, INHALE 2 PUFFS EVERY 4 HOURS AS NEEDED .  albuterol (PROVENTIL HFA;VENTOLIN HFA) 108 (90 Base) MCG/ACT inhaler, Inhale 2 puffs into the lungs every 4 (four) hours as needed for wheezing or  shortness of breath. Reported on 10/26/2015 .  fluticasone (FLONASE) 50 MCG/ACT nasal spray, SHAKE LIQUID AND USE 2 SPRAYS IN EACH NOSTRIL DAILY .  Fluticasone-Salmeterol (ADVAIR DISKUS) 250-50 MCG/DOSE AEPB, Inhale into the lungs. Marland Kitchen  ipratropium (ATROVENT HFA) 17 MCG/ACT inhaler, Inhale 2 puffs into the lungs every 6 (six) hours. Marland Kitchen  ipratropium-albuterol (DUONEB) 0.5-2.5 (3) MG/3ML SOLN, Take 3 mLs by nebulization every 4 (four) hours as needed. .  tiotropium (SPIRIVA HANDIHALER) 18 MCG inhalation capsule, Place into inhaler and inhale.  Current Outpatient Prescriptions (Analgesics):  .  meloxicam (MOBIC) 15 MG tablet, Take 1 tablet (15 mg total) by mouth daily. .  traMADol (ULTRAM) 50 MG tablet, Take 1 tablet (50 mg total) by mouth every 6 (six) hours as needed.  Current Outpatient Prescriptions (Hematological):  Marland Kitchen  Cyanocobalamin (VITAMELTS ENERGY VITAMIN B-12) 1500 MCG TBDP, Take 1 tablet by mouth daily.  Current Outpatient Prescriptions (Other):  Marland Kitchen  buPROPion (WELLBUTRIN) 75 MG tablet, Take 75 mg by mouth 2 (two) times daily. Reported on 10/26/2015 .  Cholecalciferol (VITAMIN D3) 2000 units capsule, TK 1 C PO D .  cyclobenzaprine (FLEXERIL) 10 MG tablet, Take 1 tablet (10 mg total) by mouth 3 (three) times daily as needed for muscle spasms. .  DULoxetine (CYMBALTA) 60 MG capsule, Take 60 mg by mouth daily. Reported on 10/26/2015 .  finasteride (PROSCAR) 5 MG tablet, Take 5 mg by mouth daily. Marland Kitchen  gabapentin (NEURONTIN) 800 MG tablet, Take 1 tablet (800 mg total) by mouth every 8 (eight) hours. .  lidocaine (XYLOCAINE) 5 % ointment, Apply 1 application topically See admin instructions. 2 to 4 times daily .  nicotine (NICODERM CQ - DOSED IN MG/24 HOURS) 21 mg/24hr patch, Place 21 mg onto the skin daily.  Marland Kitchen  omeprazole (PRILOSEC) 40 MG capsule, Take 40 mg by mouth 2 (two) times daily. .  polyethylene glycol (MIRALAX / GLYCOLAX) packet, Take 17 g by mouth daily. .  tamsulosin (FLOMAX) 0.4 MG  CAPS capsule, Take 0.4 mg by mouth daily. .  traZODone (DESYREL) 50 MG tablet, Take 150 mg by mouth at bedtime. .  Vitamin D, Ergocalciferol, (DRISDOL) 50000 units CAPS capsule, Take 1 capsule (50,000 Units total) by mouth 2 (two) times a week x 6 weeks. Marland Kitchen  zinc oxide 20 % ointment, Apply topically.  ROS  Constitutional: Denies any fever or chills Gastrointestinal: No reported hemesis, hematochezia, vomiting, or acute GI distress Musculoskeletal: Denies any acute onset joint swelling, redness, loss of ROM, or weakness Neurological: No reported episodes of acute onset apraxia, aphasia, dysarthria, agnosia, amnesia, paralysis, loss of coordination, or loss of consciousness  Allergies  Stanley Rios has No Known Allergies.  Goldonna  Drug: Stanley Rios  reports that he uses drugs. Alcohol:  reports that he does not drink  alcohol. Tobacco:  reports that he quit smoking about 7 months ago. His smoking use included Cigarettes. He smoked 0.00 packs per day for 40.00 years. He has quit using smokeless tobacco. Medical:  has a past medical history of Anxiety; Chronic back pain; Chronic neck pain; COPD (chronic obstructive pulmonary disease) ([place]); Depression; Hyperlipidemia; Hypertension; Opiate abuse, continuous; and Pneumonia. Surgical: Stanley Rios  has a past surgical history that includes Cholecystectomy; Spinal fusion; Neck surgery (2002 approx); and Hernia repair. Family: family history includes Bladder Cancer in his father.  Constitutional Exam  General appearance: Well nourished, well developed, and well hydrated. In no apparent acute distress Vitals:   01/22/17 1113  BP: (!) 128/97  Pulse: (!) 101  Resp: 18  Temp: 97.5 F (36.4 C)  TempSrc: Oral  SpO2: 96%  Weight: 179 lb (81.2 kg)  Height: _0  (1.803 m)   BMI Assessment: Estimated body mass index is 24.97 kg/m as calculated from the following:   Height as of this encounter: _1  (1.803 m).   Weight as of this encounter: 179 lb  (81.2 kg).  BMI interpretation table: BMI level Category Range association with higher incidence of chronic pain  <18 kg/m2 Underweight   18.5-24.9 kg/m2 Ideal body weight   25-29.9 kg/m2 Overweight Increased incidence by 20%  30-34.9 kg/m2 Obese (Class I) Increased incidence by 68%  35-39.9 kg/m2 Severe obesity (Class II) Increased incidence by 136%  >40 kg/m2 Extreme obesity (Class III) Increased incidence by 254%   BMI Readings from Last 4 Encounters:  01/22/17 24.97 kg/m  01/13/17 26.71 kg/m  01/05/17 26.80 kg/m  12/31/16 26.50 kg/m   Wt Readings from Last 4 Encounters:  01/22/17 179 lb (81.2 kg)  01/13/17 191 lb (86.6 kg)  01/05/17 191 lb 9.6 oz (86.9 kg)  12/31/16 190 lb (86.2 kg)  Psych/Mental status: Alert, oriented x 3 (person, place, & time)       Eyes: PERLA Respiratory: No evidence of acute respiratory distress  Cervical Spine Exam  Inspection: No masses, redness, or swelling Alignment: Symmetrical Functional ROM: Unrestricted ROM      Stability: No instability detected Muscle strength & Tone: Functionally intact Sensory: Unimpaired Palpation: No palpable anomalies              Upper Extremity (UE) Exam    Side: Right upper extremity  Side: Left upper extremity  Inspection: No masses, redness, swelling, or asymmetry. No contractures  Inspection: No masses, redness, swelling, or asymmetry. No contractures  Functional ROM: Unrestricted ROM          Functional ROM: Unrestricted ROM          Muscle strength & Tone: Functionally intact  Muscle strength & Tone: Functionally intact  Sensory: Unimpaired  Sensory: Unimpaired  Palpation: No palpable anomalies              Palpation: No palpable anomalies              Specialized Test(s): Deferred         Specialized Test(s): Deferred          Thoracic Spine Exam  Inspection: No masses, redness, or swelling Alignment: Symmetrical Functional ROM: Unrestricted ROM Stability: No instability detected Sensory:  Unimpaired Muscle strength & Tone: No palpable anomalies  Lumbar Spine Exam  Inspection: No masses, redness, or swelling Alignment: Symmetrical Functional ROM: Improved after treatment      Stability: No instability detected Muscle strength & Tone: Functionally intact Sensory: Movement-associated pain Palpation: Complains of area  being tender to palpation       Provocative Tests: Lumbar Hyperextension and rotation test: Positive bilaterally for facet joint pain. Patrick's Maneuver: evaluation deferred today                    Gait & Posture Assessment  Ambulation: Unassisted Gait: Relatively normal for age and body habitus Posture: WNL   Lower Extremity Exam    Side: Right lower extremity  Side: Left lower extremity  Inspection: No masses, redness, swelling, or asymmetry. No contractures  Inspection: No masses, redness, swelling, or asymmetry. No contractures  Functional ROM: Unrestricted ROM          Functional ROM: Unrestricted ROM          Muscle strength & Tone: Functionally intact  Muscle strength & Tone: Functionally intact  Sensory: Unimpaired  Sensory: Unimpaired  Palpation: No palpable anomalies  Palpation: No palpable anomalies   Assessment  Primary Diagnosis & Pertinent Problem List: The primary encounter diagnosis was Chronic low back pain (Location of Primary Source of Pain) (Bilateral) (R>L). Diagnoses of Lumbar facet syndrome (Bilateral) (R>L), DDD (degenerative disc disease), lumbar, Chronic pain syndrome, Musculoskeletal pain, Neurogenic pain, Osteoarthritis, Long term current use of opiate analgesic, Long term prescription opiate use, Opiate use, and B12 deficiency were also pertinent to this visit.  Status Diagnosis  Improving Improving Stable 1. Chronic low back pain (Location of Primary Source of Pain) (Bilateral) (R>L)   2. Lumbar facet syndrome (Bilateral) (R>L)   3. DDD (degenerative disc disease), lumbar   4. Chronic pain syndrome   5. Musculoskeletal  pain   6. Neurogenic pain   7. Osteoarthritis   8. Long term current use of opiate analgesic   9. Long term prescription opiate use   10. Opiate use   11. B12 deficiency     Problems updated and reviewed during this visit: No problems updated. Plan of Care  Pharmacotherapy (Medications Ordered): Meds ordered this encounter  Medications  . Cyanocobalamin (VITAMELTS ENERGY VITAMIN B-12) 1500 MCG TBDP    Sig: Take 1 tablet by mouth daily.    Dispense:  180 tablet    Refill:  0    Do not add to the "Automatic Refill" notification system.  . traMADol (ULTRAM) 50 MG tablet    Sig: Take 1 tablet (50 mg total) by mouth every 6 (six) hours as needed.    Dispense:  120 tablet    Refill:  2    Do not place medication on "Automatic Refill". Fill one day early if pharmacy is closed on scheduled refill date. Do not refill any sooner than every 30 days.  . cyclobenzaprine (FLEXERIL) 10 MG tablet    Sig: Take 1 tablet (10 mg total) by mouth 3 (three) times daily as needed for muscle spasms.    Dispense:  90 tablet    Refill:  2    Do not place medication on "Automatic Refill". Fill one day early if pharmacy is closed on scheduled refill date.  . gabapentin (NEURONTIN) 800 MG tablet    Sig: Take 1 tablet (800 mg total) by mouth every 8 (eight) hours.    Dispense:  90 tablet    Refill:  2    Do not place this medication, or any other prescription from our practice, on "Automatic Refill". Patient may have prescription filled one day early if pharmacy is closed on scheduled refill date.  . meloxicam (MOBIC) 15 MG tablet    Sig: Take 1 tablet (  15 mg total) by mouth daily.    Dispense:  30 tablet    Refill:  2    Do not add this medication to the electronic "Automatic Refill" notification system. Patient may have prescription filled one day early if pharmacy is closed on scheduled refill date.   New Prescriptions   No medications on file   Medications administered today: Stanley Rios had no  medications administered during this visit.  Lab-work, procedure(s), and/or referral(s): Orders Placed This Encounter  Procedures  . Radiofrequency,Lumbar    Interventional therapies: Planned, scheduled, and/or pending:   Therapeutic Right Lumbar Facet RFA under fluoroscopy and IV sedation.   Considering:   Diagnostic Left T11 & T12 intercostal nerve block Possible left T11 and T12 intercostal RFA Diagnostic bilateral lumbar facet block + bilateral S-I Block #2 Possible bilateral lumbar facet block + bilateral S-I RFA Diagnostic right-sided lumbar epidural steroid injection  Diagnostic bilateral cervical facet block  Diagnostic right-sided cervical epidural steroid injection  Diagnostic bilateral intra-articular shoulder joint injections  Diagnostic bilateral suprascapular nerve blocks    Palliative PRN treatment(s):   Palliative bilateral lumbar facet block #2+ bilateral S-Iblock #2.   Provider-requested follow-up: Return for RFA, (ASAP), by MD, in addition, Med-Mgmt, (3 mo), by NP.  Future Appointments Date Time Provider Ivor  01/26/2017 11:30 AM [facility] None  01/30/2017 11:30 AM [facility] None  02/02/2017 11:30 AM [facility] None  02/04/2017 11:30 AM [facility] None  02/06/2017 11:30 AM [facility] None  02/09/2017 11:30 AM [facility] None  02/11/2017 11:30 AM [facility] None  02/13/2017 11:30 AM [facility] None  02/16/2017 11:30 AM [facility] None  02/18/2017 11:30 AM [facility] None  02/20/2017 11:30 AM [facility] None  02/23/2017 11:30 AM [facility] None  02/25/2017 11:30 AM [facility] None  02/27/2017 11:30 AM [facility] None  03/02/2017 11:30 AM [facility] None  03/04/2017 11:30 AM [facility] None  03/06/2017 11:30 AM [facility] None  03/09/2017 11:30 AM [facility] None  03/11/2017 11:30 AM [facility] None  03/13/2017 11:30 AM [facility] None  03/16/2017 11:30 AM [facility] None  03/18/2017 11:30 AM [facility] None  03/20/2017 11:30 AM [facility] None  03/23/2017 11:30 AM [facility] None  03/25/2017 11:30 AM [facility] None  03/27/2017 11:30 AM [facility] None  04/01/2017 11:30 AM [facility] None  04/03/2017 11:30 AM [facility] None  04/06/2017 11:30 AM [facility] None  04/20/2017 10:15 AM Milinda Pointer, MD [facility] None  04/21/2017 12:45 PM Vevelyn Francois, NP [facility] None   Primary Care Physician: Marygrace Drought, MD Location: [facility] Note by: Kathlen Brunswick. Dossie Arbour, M.D, DABA, DABAPM, DABPM, DABIPP, FIPP Date: 01/22/2017; Time: 1:01 PM  Patient Instructions  ___________You have been given a script for Tramadol today.  You have been given pre procedure instructions for a Radiofrequency. You have prescriptions to get picked up at the pharmacy. _________________________________________________________________________________  Preparing  for Procedure with Sedation Instructions: . Oral Intake: Do not eat or drink anything for at least 8 hours prior to your procedure. . Transportation: Public transportation is not allowed. Bring an adult driver. The driver must be physically present in our waiting room before any procedure can be started. Marland Kitchen Physical Assistance:  Bring an adult physically capable of assisting you, in the event you need help. This adult should keep you company at home for at least 6 hours after the procedure. . Blood Pressure Medicine: Take your blood pressure medicine with a sip of water the morning of the procedure. . Blood thinners:  . Diabetics on insulin: Notify the staff so that you can be scheduled 1st case in the morning. If your diabetes requires high dose insulin, take only  of your normal insulin dose the morning of the procedure and notify the staff that you have done so. . Preventing infections: Shower with an antibacterial soap the morning of your procedure. . Build-up your immune system: Take 1000 mg of Vitamin C with every meal (3 times a day) the day prior to your procedure. Marland Kitchen Antibiotics: Inform the staff if you have a condition or reason that requires you to take antibiotics before dental procedures. . Pregnancy: If you are pregnant, call and cancel the procedure. . Sickness: If you have a cold, fever, or any active infections, call and cancel the procedure. . Arrival: You must be in the facility at least 30 minutes prior to your scheduled procedure. . Children: Do not bring children with you. . Dress appropriately: Bring dark clothing that you would not mind if they get stained. . Valuables: Do not bring any jewelry or valuables. Procedure appointments are reserved for interventional treatments only. Marland Kitchen No Prescription Refills. . No medication changes will be discussed during procedure appointments. . No disability issues will be discussed. ____________________________________________________________________________________________  Preparing for Procedure with Sedation Instructions: . Oral Intake: Do not eat or drink anything for at least 8 hours prior to your procedure. . Transportation: Public transportation is not allowed. Bring an adult driver. The driver must be physically present in our waiting room before any  procedure can be started. Marland Kitchen Physical Assistance: Bring an adult capable of physically assisting you, in the event you need help. . Blood Pressure Medicine: Take your blood pressure medicine with a sip of water the morning of the procedure. . Insulin: Take only  of your normal insulin dose. . Preventing infections: Shower with an antibacterial soap the morning of your procedure. . Build-up your immune system: Take 1000 mg of Vitamin C with every meal (3 times a day) the day prior to your procedure. . Pregnancy: If you are pregnant, call and cancel the procedure. . Sickness: If you have a cold, fever, or any active infections, call and cancel the procedure. . Arrival: You must be in the facility at least 30 minutes prior to your scheduled procedure. . Children: Do not bring children with you. . Dress appropriately: Bring dark clothing that you would not mind if they get stained. . Valuables: Do not bring any jewelry or valuables. Procedure appointments are reserved for interventional treatments only. Marland Kitchen No Prescription Refills. . No medication changes will be discussed during procedure appointments. No disability issues will be discussed.Radiofrequency Lesioning Radiofrequency lesioning is a procedure that is performed to relieve pain. The procedure is often used for back, neck, or arm pain. Radiofrequency lesioning involves the use of a machine that creates radio waves to make heat. During the  procedure, the heat is applied to the nerve that carries the pain signal. The heat damages the nerve and interferes with the pain signal. Pain relief usually starts about 2 weeks after the procedure and lasts for 6 months to 1 year. Tell a health care provider about:  Any allergies you have.  All medicines you are taking, including vitamins, herbs, eye drops, creams, and over-the-counter medicines.  Any problems you or family members have had with anesthetic medicines.  Any blood disorders you  have.  Any surgeries you have had.  Any medical conditions you have.  Whether you are pregnant or may be pregnant. What are the risks? Generally, this is a safe procedure. However, problems may occur, including:  Pain or soreness at the injection site.  Infection at the injection site.  Damage to nerves or blood vessels.  What happens before the procedure?  Ask your health care provider about: ? Changing or stopping your regular medicines. This is especially important if you are taking diabetes medicines or blood thinners. ? Taking medicines such as aspirin and ibuprofen. These medicines can thin your blood. Do not take these medicines before your procedure if your health care provider instructs you not to.  Follow instructions from your health care provider about eating or drinking restrictions.  Plan to have someone take you home after the procedure.  If you go home right after the procedure, plan to have someone with you for 24 hours. What happens during the procedure?  You will be given one or more of the following: ? A medicine to help you relax (sedative). ? A medicine to numb the area (local anesthetic).  You will be awake during the procedure. You will need to be able to talk with the health care provider during the procedure.  With the help of a type of X-ray (fluoroscopy), the health care provider will insert a radiofrequency needle into the area to be treated.  Next, a wire that carries the radio waves (electrode) will be put through the radiofrequency needle. An electrical pulse will be sent through the electrode to verify the correct nerve. You will feel a tingling sensation, and you may have muscle twitching.  Then, the tissue that is around the needle tip will be heated by an electric current that is passed using the radiofrequency machine. This will numb the nerves.  A bandage (dressing) will be put on the insertion area after the procedure is done. The  procedure may vary among health care providers and hospitals. What happens after the procedure?  Your blood pressure, heart rate, breathing rate, and blood oxygen level will be monitored often until the medicines you were given have worn off.  Return to your normal activities as directed by your health care provider. This information is not intended to replace advice given to you by your health care provider. Make sure you discuss any questions you have with your health care provider. Document Released: 03/12/2011 Document Revised: 12/20/2015 Document Reviewed: 08/21/2014 Elsevier Interactive Patient Education  [employer].

## 2017-01-23 DIAGNOSIS — R001 Bradycardia, unspecified: Secondary | ICD-10-CM | POA: Diagnosis not present

## 2017-01-26 ENCOUNTER — Encounter: Payer: PPO | Attending: Pulmonary Disease

## 2017-01-26 ENCOUNTER — Encounter: Payer: Self-pay | Admitting: Respiratory Therapy

## 2017-01-26 DIAGNOSIS — J449 Chronic obstructive pulmonary disease, unspecified: Secondary | ICD-10-CM | POA: Diagnosis not present

## 2017-01-26 NOTE — Progress Notes (Signed)
Pulmonary Individual Treatment Plan  Patient Details  Name: Stanley Rios. MRN: [id number] Date of Birth: 1956/01/29 Referring Provider:     Pulmonary Rehab from 01/05/2017 in [facility] Cardiac and Pulmonary Rehab  Referring Provider  [name] MD      Initial Encounter Date:    Pulmonary Rehab from 01/05/2017 in [facility] Cardiac and Pulmonary Rehab  Date  01/05/17  Referring Provider  [name] MD      Visit Diagnosis: Chronic obstructive pulmonary disease, unspecified COPD type ([place])  Patient's Home Medications on Admission:  Current Outpatient Prescriptions:    albuterol (PROAIR HFA) 108 (90 Base) MCG/ACT inhaler, INHALE 2 PUFFS EVERY 4 HOURS AS NEEDED, Disp: , Rfl:    albuterol (PROVENTIL HFA;VENTOLIN HFA) 108 (90 Base) MCG/ACT inhaler, Inhale 2 puffs into the lungs every 4 (four) hours as needed for wheezing or shortness of breath. Reported on 10/26/2015, Disp: , Rfl:    buPROPion (WELLBUTRIN) 75 MG tablet, Take 75 mg by mouth 2 (two) times daily. Reported on 10/26/2015, Disp: , Rfl:    Cholecalciferol (VITAMIN D3) 2000 units capsule, TK 1 C PO D, Disp: , Rfl: 0   Cyanocobalamin (VITAMELTS ENERGY VITAMIN B-12) 1500 MCG TBDP, Take 1 tablet by mouth daily., Disp: 180 tablet, Rfl: 0   cyclobenzaprine (FLEXERIL) 10 MG tablet, Take 1 tablet (10 mg total) by mouth 3 (three) times daily as needed for muscle spasms., Disp: 90 tablet, Rfl: 2   diltiazem (CARDIZEM SR) 90 MG 12 hr capsule, TAKE ONE CAPSULE BY MOUTH TWICE DAILY, Disp: , Rfl:    DULoxetine (CYMBALTA) 60 MG capsule, Take 60 mg by mouth daily. Reported on 10/26/2015, Disp: , Rfl:    finasteride (PROSCAR) 5 MG tablet, Take 5 mg by mouth daily., Disp: , Rfl:    fluticasone (FLONASE) 50 MCG/ACT nasal spray, SHAKE LIQUID AND USE 2 SPRAYS IN EACH NOSTRIL DAILY, Disp: , Rfl:    Fluticasone-Salmeterol (ADVAIR DISKUS) 250-50 MCG/DOSE AEPB, Inhale into the lungs., Disp: , Rfl:    furosemide (LASIX) 20 MG tablet, Take 1  tablet (20 mg total) by mouth daily. For 1 week and then when necessary as needed for swelling and edema, Disp: 30 tablet, Rfl: 0   gabapentin (NEURONTIN) 800 MG tablet, Take 1 tablet (800 mg total) by mouth every 8 (eight) hours., Disp: 90 tablet, Rfl: 2   ipratropium (ATROVENT HFA) 17 MCG/ACT inhaler, Inhale 2 puffs into the lungs every 6 (six) hours., Disp: , Rfl:    ipratropium-albuterol (DUONEB) 0.5-2.5 (3) MG/3ML SOLN, Take 3 mLs by nebulization every 4 (four) hours as needed., Disp: , Rfl:    lidocaine (XYLOCAINE) 5 % ointment, Apply 1 application topically See admin instructions. 2 to 4 times daily, Disp: , Rfl:    meloxicam (MOBIC) 15 MG tablet, Take 1 tablet (15 mg total) by mouth daily., Disp: 30 tablet, Rfl: 2   metoprolol tartrate (LOPRESSOR) 25 MG tablet, Take 1 tablet (25 mg total) by mouth 2 (two) times daily., Disp: 60 tablet, Rfl: 0   nicotine (NICODERM CQ - DOSED IN MG/24 HOURS) 21 mg/24hr patch, Place 21 mg onto the skin daily. , Disp: , Rfl:    omeprazole (PRILOSEC) 40 MG capsule, Take 40 mg by mouth 2 (two) times daily., Disp: , Rfl:    polyethylene glycol (MIRALAX / GLYCOLAX) packet, Take 17 g by mouth daily., Disp: , Rfl:    predniSONE (DELTASONE) 10 MG tablet, Take 1 tablet by mouth once., Disp: , Rfl:    simvastatin (  ZOCOR) 20 MG tablet, Take 20 mg by mouth at bedtime. , Disp: , Rfl:    tamsulosin (FLOMAX) 0.4 MG CAPS capsule, Take 0.4 mg by mouth daily., Disp: , Rfl:    tiotropium (SPIRIVA HANDIHALER) 18 MCG inhalation capsule, Place into inhaler and inhale., Disp: , Rfl:    traMADol (ULTRAM) 50 MG tablet, Take 1 tablet (50 mg total) by mouth every 6 (six) hours as needed., Disp: 120 tablet, Rfl: 2   traZODone (DESYREL) 50 MG tablet, Take 150 mg by mouth at bedtime., Disp: , Rfl:    Vitamin D, Ergocalciferol, (DRISDOL) 50000 units CAPS capsule, Take 1 capsule (50,000 Units total) by mouth 2 (two) times a week x 6 weeks., Disp: 12 capsule, Rfl: 0   zinc  oxide 20 % ointment, Apply topically., Disp: , Rfl:   Past Medical History: Past Medical History:  Diagnosis Date   Anxiety    Chronic back pain    Chronic neck pain    COPD (chronic obstructive pulmonary disease) (HCC)    Depression    Hyperlipidemia    Hypertension    Opiate abuse, continuous    Pneumonia     Tobacco Use: History  Smoking Status   Former Smoker   Packs/day: 0.00   Years: 40.00   Types: Cigarettes   Quit date: 05/28/2016  Smokeless Tobacco   Former Systems developer    Comment: continues to use patches    Labs: 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 Latest Ref Rng & Units 01/16/2016   PHART 7.350 - 7.450 7.44   PCO2ART 32.0 - 48.0 mmHg 42   HCO3 21.0 - 28.0 mEq/L 28.5(H)   O2SAT % 88.3       ADL UCSD:     Pulmonary Assessment Scores    Row Name 01/05/17 1612         ADL UCSD   ADL Phase Entry     SOB Score total 52     Rest 0     Walk 2     Stairs 3     Bath 3     Dress 0     Shop 3       mMRC Score   mMRC Score 1        Pulmonary Function Assessment:     Pulmonary Function Assessment - 01/05/17 1609      Pulmonary Function Tests   FVC% 27 %   FEV1% 27 %   FEV1/FVC Ratio 74.48     Breath   Bilateral Breath Sounds Clear   Shortness of Breath Yes;Fear of Shortness of Breath;Limiting activity      Exercise Target Goals:    Exercise Program Goal: Individual exercise prescription set with THRR, safety & activity barriers. Participant demonstrates ability to understand and report RPE using BORG scale, to self-measure pulse accurately, and to acknowledge the importance of the exercise prescription.  Exercise Prescription Goal: Starting with aerobic activity 30 plus minutes a day, 3 days per week for initial exercise prescription. Provide home exercise prescription and guidelines that participant acknowledges understanding prior to discharge.  Activity Barriers & Risk Stratification:      Activity Barriers & Cardiac Risk Stratification - 01/05/17 1605      Activity Barriers & Cardiac Risk Stratification   Activity Barriers Back Problems;Deconditioning;Muscular Weakness;Shortness of Breath;Balance Concerns      6 Minute Walk:     6 Minute Walk    Row Name 01/05/17 1601  6 Minute Walk   Phase Initial     Distance 625 feet     Walk Time 4.13 minutes     # of Rest Breaks 2  57 sec, 55 sec     MPH 1.72     METS 2.47     RPE 15     Perceived Dyspnea  4     VO2 Peak 8.63     Symptoms Yes (comment)     Comments SOB, legs cramping     Resting HR 101 bpm     Resting BP 122/64     Max Ex. HR 114 bpm     Max Ex. BP 132/64     2 Minute Post BP 128/70       Interval HR   Baseline HR 101     1 Minute HR 104     2 Minute HR 103     3 Minute HR 108     4 Minute HR 109     5 Minute HR 112     6 Minute HR 114     2 Minute Post HR 109     Interval Heart Rate? Yes       Interval Oxygen   Interval Oxygen? Yes     Baseline Oxygen Saturation % 95 %     Baseline Liters of Oxygen 3 L     1 Minute Oxygen Saturation % 94 %     1 Minute Liters of Oxygen 3 L     2 Minute Oxygen Saturation % 94 %     2 Minute Liters of Oxygen 3 L     3 Minute Oxygen Saturation % 94 %     3 Minute Liters of Oxygen 3 L     4 Minute Oxygen Saturation % 96 %     4 Minute Liters of Oxygen 3 L     5 Minute Oxygen Saturation % 95 %     5 Minute Liters of Oxygen 3 L     6 Minute Oxygen Saturation % 95 %     6 Minute Liters of Oxygen 3 L     2 Minute Post Oxygen Saturation % 96 %     2 Minute Post Liters of Oxygen 3 L       Oxygen Initial Assessment:     Oxygen Initial Assessment - 01/05/17 1616      Home Oxygen   Home Oxygen Device Home Concentrator;E-Tanks   Sleep Oxygen Prescription Continuous   Liters per minute 3   Home Exercise Oxygen Prescription Continuous   Liters per minute 3   Home at Rest Exercise Oxygen Prescription Continuous   Liters per minute 3    Compliance with Home Oxygen Use Yes     Initial 6 min Walk   Oxygen Used Continuous;E-Tanks   Liters per minute 3   Resting Oxygen Saturation  during 6 min walk 95 %   Exercise Oxygen Saturation  during 6 min walk 94 %     Program Oxygen Prescription   Program Oxygen Prescription Continuous;E-Tanks   Liters per minute 3     Intervention   Short Term Goals To learn and understand importance of monitoring SPO2 with pulse oximeter and demonstrate accurate use of the pulse oximeter.;To learn and exhibit compliance with exercise, home and travel O2 prescription;To Learn and understand importance of maintaining oxygen saturations>88%   Long  Term Goals Exhibits compliance with exercise, home and travel O2 prescription;Maintenance of  O2 saturations>88%;Verbalizes importance of monitoring SPO2 with pulse oximeter and return demonstration      Oxygen Re-Evaluation:     Oxygen Re-Evaluation    Row Name 01/14/17 1524             Goals/Expected Outcomes   Short Term Goals To learn and demonstrate proper purse lipped breathing techniques or other breathing techniques.       Comments Mr. Sawa was taught how purse lip breathing is to help with his shortness of breath while exercising.        Goals/Expected Outcomes Short Term: Mr. Warf needs to be more proficient in  purse lipped breathing techniques. Long Term: Patient to be independent with purse lip breathing techniques.          Oxygen Discharge (Final Oxygen Re-Evaluation):     Oxygen Re-Evaluation - 01/14/17 1524      Goals/Expected Outcomes   Short Term Goals To learn and demonstrate proper purse lipped breathing techniques or other breathing techniques.   Comments Mr. Koval was taught how purse lip breathing is to help with his shortness of breath while exercising.    Goals/Expected Outcomes Short Term: Mr. Winsor needs to be more proficient in  purse lipped breathing techniques. Long Term: Patient to be independent with  purse lip breathing techniques.      Initial Exercise Prescription:     Initial Exercise Prescription - 01/05/17 1600      Date of Initial Exercise RX and Referring Provider   Date 01/05/17   Referring Provider [name] MD     Oxygen   Oxygen Continuous   Liters 3     Treadmill   MPH 1.7   Grade 0   Minutes 15   METs 2.3     NuStep   Level 1   SPM 80   Minutes 15   METs 2     REL-XR   Level 1   Speed 50   Minutes 15   METs 2     Prescription Details   Frequency (times per week) 3   Duration Progress to 45 minutes of aerobic exercise without signs/symptoms of physical distress     Intensity   THRR 40-80% of Max Heartrate 124-147   Ratings of Perceived Exertion 11-13   Perceived Dyspnea 0-4     Progression   Progression Continue to progress workloads to maintain intensity without signs/symptoms of physical distress.     Resistance Training   Training Prescription Yes   Weight 3 lbs   Reps 10-15      Perform Capillary Blood Glucose checks as needed.  Exercise Prescription Changes:     Exercise Prescription Changes    Row Name 01/14/17 1400             Response to Exercise   Blood Pressure (Admit) 142/74       Blood Pressure (Exercise) 142/80       Blood Pressure (Exit) 120/80       Heart Rate (Admit) 104 bpm       Heart Rate (Exercise) 118 bpm       Heart Rate (Exit) 105 bpm       Oxygen Saturation (Admit) 93 %       Oxygen Saturation (Exercise) 88 %       Oxygen Saturation (Exit) 97 %       Rating of Perceived Exertion (Exercise) 17       Perceived Dyspnea (Exercise) 4  Symptoms SOB, fatigued       Comments first full day of exercise       Duration Progress to 45 minutes of aerobic exercise without signs/symptoms of physical distress       Intensity THRR unchanged         Progression   Progression Continue to progress workloads to maintain intensity without signs/symptoms of physical distress.       Average METs 1.49          Resistance Training   Training Prescription Yes       Weight 3 lbs       Reps 10-15         Interval Training   Interval Training No         Oxygen   Oxygen Continuous       Liters 3         Treadmill   MPH 1       Grade 0       Minutes 4  2 min, 1:40       METs 1.77         NuStep   Level 1       SPM 56       Minutes 15       METs 1.5         REL-XR   Level 1       Speed 30       Minutes 15       METs 1.2          Exercise Comments:     Exercise Comments    Row Name 01/14/17 1456 01/14/17 1530         Exercise Comments First full day of exercise!  Patient was oriented to gym and equipment including functions, settings, policies, and procedures.  Patient's individual exercise prescription and treatment plan were reviewed.  All starting workloads were established based on the results of the 6 minute walk test done at initial orientation visit.  The plan for exercise progression was also introduced and progression will be customized based on patient's performance and goals. Mr. Haymer was exercising on the treadmill and was short of breath after 2 minutes. He did intervals of about two minutes, pulse and spO2 was checked when regaining his breath. S HR stayed was reading in the 60's but then increased into the 100's. Two sat probes were used to determine if the reading was correct. He was placed on telemetry for any drops in heart rate while exercising but now significant changes were noted.         Exercise Goals and Review:     Exercise Goals    Row Name 01/05/17 1607             Exercise Goals   Increase Physical Activity Yes       Intervention Provide advice, education, support and counseling about physical activity/exercise needs.;Develop an individualized exercise prescription for aerobic and resistive training based on initial evaluation findings, risk stratification, comorbidities and participant's personal goals.       Expected Outcomes  Achievement of increased cardiorespiratory fitness and enhanced flexibility, muscular endurance and strength shown through measurements of functional capacity and personal statement of participant.       Increase Strength and Stamina Yes       Intervention Provide advice, education, support and counseling about physical activity/exercise needs.;Develop an individualized exercise prescription for aerobic and resistive training based on initial evaluation findings, risk  stratification, comorbidities and participant's personal goals.       Expected Outcomes Achievement of increased cardiorespiratory fitness and enhanced flexibility, muscular endurance and strength shown through measurements of functional capacity and personal statement of participant.          Exercise Goals Re-Evaluation :   Discharge Exercise Prescription (Final Exercise Prescription Changes):     Exercise Prescription Changes - 01/14/17 1400      Response to Exercise   Blood Pressure (Admit) 142/74   Blood Pressure (Exercise) 142/80   Blood Pressure (Exit) 120/80   Heart Rate (Admit) 104 bpm   Heart Rate (Exercise) 118 bpm   Heart Rate (Exit) 105 bpm   Oxygen Saturation (Admit) 93 %   Oxygen Saturation (Exercise) 88 %   Oxygen Saturation (Exit) 97 %   Rating of Perceived Exertion (Exercise) 17   Perceived Dyspnea (Exercise) 4   Symptoms SOB, fatigued   Comments first full day of exercise   Duration Progress to 45 minutes of aerobic exercise without signs/symptoms of physical distress   Intensity THRR unchanged     Progression   Progression Continue to progress workloads to maintain intensity without signs/symptoms of physical distress.   Average METs 1.49     Resistance Training   Training Prescription Yes   Weight 3 lbs   Reps 10-15     Interval Training   Interval Training No     Oxygen   Oxygen Continuous   Liters 3     Treadmill   MPH 1   Grade 0   Minutes 4  2 min, 1:40   METs 1.77      NuStep   Level 1   SPM 56   Minutes 15   METs 1.5     REL-XR   Level 1   Speed 30   Minutes 15   METs 1.2      Nutrition:  Target Goals: Understanding of nutrition guidelines, daily intake of sodium 1500mg , cholesterol 200mg , calories 30% from fat and 7% or less from saturated fats, daily to have 5 or more servings of fruits and vegetables.  Biometrics:     Pre Biometrics - 01/05/17 1607      Pre Biometrics   Height 5' 10.9" (1.801 m)   Weight 191 lb 9.6 oz (86.9 kg)   Waist Circumference 43 inches   Hip Circumference 37 inches   Waist to Hip Ratio 1.16 %   BMI (Calculated) 26.9       Nutrition Therapy Plan and Nutrition Goals:   Nutrition Discharge: Rate Your Plate Scores:   Nutrition Goals Re-Evaluation:   Nutrition Goals Discharge (Final Nutrition Goals Re-Evaluation):   Psychosocial: Target Goals: Acknowledge presence or absence of significant depression and/or stress, maximize coping skills, provide positive support system. Participant is able to verbalize types and ability to use techniques and skills needed for reducing stress and depression.   Initial Review & Psychosocial Screening:     Initial Psych Review & Screening - 01/05/17 [facility]? Yes   Comments Mr Caleen Jobs has good support from his wife and one son. He states he has no depression and is looking forward to increasing his stamina and energy level through his participation in [place].     Barriers   Psychosocial barriers to participate in program There are no identifiable barriers or psychosocial needs.;The patient should benefit from training in stress management and relaxation.     Screening  Interventions   Interventions Encouraged to exercise      Quality of Life Scores:     Quality of Life - 01/05/17 1626      Quality of Life Scores   Health/Function Pre 19.81 %   Socioeconomic Pre 19 %   Psych/Spiritual Pre 20.14 %   Family Pre 20.8  %   GLOBAL Pre 19.86 %      PHQ-9: Recent Review Flowsheet Data    Depression screen [facility] 2/9 01/22/2017 01/13/2017 01/05/2017 12/31/2016 12/17/2016   Decreased Interest 0 1 1 0 0   Down, Depressed, Hopeless 0 3 0 0 0   PHQ - 2 Score 0 4 1 0 0   Altered sleeping - 1 1 - -   Tired, decreased energy - 1 1 - -   Change in appetite - 0 0 - -   Feeling bad or failure about yourself  - 1 1 - -   Trouble concentrating - 0 0 - -   Moving slowly or fidgety/restless - 0 0 - -   Suicidal thoughts - 0 0 - -   PHQ-9 Score - 7 4 - -   Difficult doing work/chores - Somewhat difficult - - -     Interpretation of Total Score  Total Score Depression Severity:  1-4 = Minimal depression, 5-9 = Mild depression, 10-14 = Moderate depression, 15-19 = Moderately severe depression, 20-27 = Severe depression   Psychosocial Evaluation and Intervention:   Psychosocial Re-Evaluation:   Psychosocial Discharge (Final Psychosocial Re-Evaluation):   Education: Education Goals: Education classes will be provided on a weekly basis, covering required topics. Participant will state understanding/return demonstration of topics presented.  Learning Barriers/Preferences:     Learning Barriers/Preferences - 01/05/17 1608      Learning Barriers/Preferences   Learning Barriers None   Learning Preferences None      Education Topics: Initial Evaluation Education: - Verbal, written and demonstration of respiratory meds, RPE/PD scales, oximetry and breathing techniques. Instruction on use of nebulizers and MDIs: cleaning and proper use, rinsing mouth with steroid doses and importance of monitoring MDI activations.   Pulmonary Rehab from 01/14/2017 in [facility] Cardiac and Pulmonary Rehab  Date  01/05/17  Educator  LB  Instruction Review Code  2- meets goals/outcomes      General Nutrition Guidelines/Fats and Fiber: -Group instruction provided by verbal, written material, models and posters to present the general  guidelines for heart healthy nutrition. Gives an explanation and review of dietary fats and fiber.   Controlling Sodium/Reading Food Labels: -Group verbal and written material supporting the discussion of sodium use in heart healthy nutrition. Review and explanation with models, verbal and written materials for utilization of the food label.   Exercise Physiology & Risk Factors: - Group verbal and written instruction with models to review the exercise physiology of the cardiovascular system and associated critical values. Details cardiovascular disease risk factors and the goals associated with each risk factor.   Aerobic Exercise & Resistance Training: - Gives group verbal and written discussion on the health impact of inactivity. On the components of aerobic and resistive training programs and the benefits of this training and how to safely progress through these programs.   Flexibility, Balance, General Exercise Guidelines: - Provides group verbal and written instruction on the benefits of flexibility and balance training programs. Provides general exercise guidelines with specific guidelines to those with heart or lung disease. Demonstration and skill practice provided.   Stress Management: - Provides group verbal  and written instruction about the health risks of elevated stress, cause of high stress, and healthy ways to reduce stress.   Depression: - Provides group verbal and written instruction on the correlation between heart/lung disease and depressed mood, treatment options, and the stigmas associated with seeking treatment.   Exercise & Equipment Safety: - Individual verbal instruction and demonstration of equipment use and safety with use of the equipment.   Pulmonary Rehab from 01/14/2017 in [facility] Cardiac and Pulmonary Rehab  Date  01/14/17  Educator  [facility]  Instruction Review Code  2- meets goals/outcomes      Infection Prevention: - Provides verbal and written material  to individual with discussion of infection control including proper hand washing and proper equipment cleaning during exercise session.   Pulmonary Rehab from 01/14/2017 in [facility] Cardiac and Pulmonary Rehab  Date  01/14/17  Educator  [facility]  Instruction Review Code  2- meets goals/outcomes      Falls Prevention: - Provides verbal and written material to individual with discussion of falls prevention and safety.   Pulmonary Rehab from 01/14/2017 in [facility] Cardiac and Pulmonary Rehab  Date  01/05/17  Educator  LB  Instruction Review Code  2- meets goals/outcomes      Diabetes: - Individual verbal and written instruction to review signs/symptoms of diabetes, desired ranges of glucose level fasting, after meals and with exercise. Advice that pre and post exercise glucose checks will be done for 3 sessions at entry of program.   Chronic Lung Diseases: - Group verbal and written instruction to review new updates, new respiratory medications, new advancements in procedures and treatments. Provide informative websites and "800" numbers of self-education.   Lung Procedures: - Group verbal and written instruction to describe testing methods done to diagnose lung disease. Review the outcome of test results. Describe the treatment choices: Pulmonary Function Tests, ABGs and oximetry.   Energy Conservation: - Provide group verbal and written instruction for methods to conserve energy, plan and organize activities. Instruct on pacing techniques, use of adaptive equipment and posture/positioning to relieve shortness of breath.   Triggers: - Group verbal and written instruction to review types of environmental controls: home humidity, furnaces, filters, dust mite/pet prevention, HEPA vacuums. To discuss weather changes, air quality and the benefits of nasal washing.   Exacerbations: - Group verbal and written instruction to provide: warning signs, infection symptoms, calling MD promptly, preventive  modes, and value of vaccinations. Review: effective airway clearance, coughing and/or vibration techniques. Create an Sports administrator.   Oxygen: - Individual and group verbal and written instruction on oxygen therapy. Includes supplement oxygen, available portable oxygen systems, continuous and intermittent flow rates, oxygen safety, concentrators, and Medicare reimbursement for oxygen.   Pulmonary Rehab from 01/14/2017 in [facility] Cardiac and Pulmonary Rehab  Date  01/05/17  Educator  LB  Instruction Review Code  2- meets goals/outcomes      Respiratory Medications: - Group verbal and written instruction to review medications for lung disease. Drug class, frequency, complications, importance of spacers, rinsing mouth after steroid MDI's, and proper cleaning methods for nebulizers.   Pulmonary Rehab from 01/14/2017 in [facility] Cardiac and Pulmonary Rehab  Date  01/05/17  Educator  LB  Instruction Review Code  2- meets goals/outcomes      AED/CPR: - Group verbal and written instruction with the use of models to demonstrate the basic use of the AED with the basic ABC's of resuscitation.   Breathing Retraining: - Provides individuals verbal and written instruction on purpose,  frequency, and proper technique of diaphragmatic breathing and pursed-lipped breathing. Applies individual practice skills.   Pulmonary Rehab from 01/14/2017 in [facility] Cardiac and Pulmonary Rehab  Date  01/05/17  Educator  LB  Instruction Review Code  2- meets goals/outcomes      Anatomy and Physiology of the Lungs: - Group verbal and written instruction with the use of models to provide basic lung anatomy and physiology related to function, structure and complications of lung disease.   Heart Failure: - Group verbal and written instruction on the basics of heart failure: signs/symptoms, treatments, explanation of ejection fraction, enlarged heart and cardiomyopathy.   Sleep Apnea: - Individual verbal and written  instruction to review Obstructive Sleep Apnea. Review of risk factors, methods for diagnosing and types of masks and machines for OSA.   Anxiety: - Provides group, verbal and written instruction on the correlation between heart/lung disease and anxiety, treatment options, and management of anxiety.   Relaxation: - Provides group, verbal and written instruction about the benefits of relaxation for patients with heart/lung disease. Also provides patients with examples of relaxation techniques.   Knowledge Questionnaire Score:     Knowledge Questionnaire Score - 01/05/17 1609      Knowledge Questionnaire Score   Pre Score 8/10       Core Components/Risk Factors/Patient Goals at Admission:     Personal Goals and Risk Factors at Admission - 01/05/17 1618      Core Components/Risk Factors/Patient Goals on Admission    Weight Management Yes;Weight Loss   Intervention Weight Management: Develop a combined nutrition and exercise program designed to reach desired caloric intake, while maintaining appropriate intake of nutrient and fiber, sodium and fats, and appropriate energy expenditure required for the weight goal.;Weight Management: Provide education and appropriate resources to help participant work on and attain dietary goals.   Admit Weight 191 lb 9.6 oz (86.9 kg)   Goal Weight: Short Term 186 lb (84.4 kg)   Goal Weight: Long Term 160 lb (72.6 kg)   Expected Outcomes Short Term: Continue to assess and modify interventions until short term weight is achieved;Weight Maintenance: Understanding of the daily nutrition guidelines, which includes 25-35% calories from fat, 7% or less cal from saturated fats, less than 200mg  cholesterol, less than 1.5gm of sodium, & 5 or more servings of fruits and vegetables daily;Weight Loss: Understanding of general recommendations for a balanced deficit meal plan, which promotes 1-2 lb weight loss per week and includes a negative energy balance of [phone]  kcal/d;Understanding recommendations for meals to include 15-35% energy as protein, 25-35% energy from fat, 35-60% energy from carbohydrates, less than 200mg  of dietary cholesterol, 20-35 gm of total fiber daily;Understanding of distribution of calorie intake throughout the day with the consumption of 4-5 meals/snacks   Tobacco Cessation Yes   Number of packs per day Quit 11/17; still using nicotine patches   Improve shortness of breath with ADL's Yes   Intervention Provide education, individualized exercise plan and daily activity instruction to help decrease symptoms of SOB with activities of daily living.   Expected Outcomes Short Term: Achieves a reduction of symptoms when performing activities of daily living.   Develop more efficient breathing techniques such as purse lipped breathing and diaphragmatic breathing; and practicing self-pacing with activity Yes   Intervention Provide education, demonstration and support about specific breathing techniuqes utilized for more efficient breathing. Include techniques such as pursed lipped breathing, diaphragmatic breathing and self-pacing activity.   Expected Outcomes Short Term: Participant will be able  to demonstrate and use breathing techniques as needed throughout daily activities.   Increase knowledge of respiratory medications and ability to use respiratory devices properly  Yes  Advair, Spiriva, Atrovent, Albuterol SVN and MDI; spacer given   Intervention Provide education and demonstration as needed of appropriate use of medications, inhalers, and oxygen therapy.   Expected Outcomes Short Term: Achieves understanding of medications use. Understands that oxygen is a medication prescribed by physician. Demonstrates appropriate use of inhaler and oxygen therapy.   Hypertension Yes   Intervention Provide education on lifestyle modifcations including regular physical activity/exercise, weight management, moderate sodium restriction and increased  consumption of fresh fruit, vegetables, and low fat dairy, alcohol moderation, and smoking cessation.;Monitor prescription use compliance.   Expected Outcomes Short Term: Continued assessment and intervention until BP is < 140/68mm HG in hypertensive participants. < 130/19mm HG in hypertensive participants with diabetes, heart failure or chronic kidney disease.;Long Term: Maintenance of blood pressure at goal levels.      Core Components/Risk Factors/Patient Goals Review:    Core Components/Risk Factors/Patient Goals at Discharge (Final Review):    ITP Comments:     ITP Comments    Row Name 01/26/17 0823           ITP Comments 30 day note review with Dr Emily Filbert, Medical Director of [place]          Comments:30 day note review with Dr Emily Filbert, Medical Director of [place]

## 2017-01-26 NOTE — Progress Notes (Signed)
Daily Session Note  Patient Details  Name: Fenton Candee. MRN: [id number] Date of Birth: Sep 20, 1955 Referring Provider:     Pulmonary Rehab from 01/05/2017 in [facility] Cardiac and Pulmonary Rehab  Referring Provider  [name] MD      Encounter Date: 01/26/2017  Check In:     Session Check In - 01/26/17 1255      Check-In   Location [facility]-Cardiac & Pulmonary Rehab   Staff Present Carson Myrtle, BS, RRT, Respiratory Therapist;[name], BS, ACSM CEP, Exercise Physiologist;Amanda Oletta Darter, BA, ACSM CEP, Exercise Physiologist   Supervising physician immediately available to respond to emergencies [name] immediately available ER MD   Physician(s) [place] and [place]   Medication changes reported     No   Fall or balance concerns reported    No   Warm-up and Cool-down Performed on first and last piece of equipment   Resistance Training Performed Yes   VAD Patient? No     Pain Assessment   Currently in Pain? No/denies         History  Smoking Status  . Former Smoker  . Packs/day: 0.00  . Years: 40.00  . Types: Cigarettes  . Quit date: 05/28/2016  Smokeless Tobacco  . Former Systems developer    Comment: continues to use patches    Goals Met:  Independence with exercise equipment Exercise tolerated well No report of cardiac concerns or symptoms Strength training completed today  Goals Unmet:  Not Applicable  Comments: Pt able to follow exercise prescription today without complaint.  Will continue to monitor for progression.    Dr. Emily Filbert is Medical Director for [place] and [name].

## 2017-01-29 DIAGNOSIS — J984 Other disorders of lung: Secondary | ICD-10-CM | POA: Diagnosis not present

## 2017-01-29 DIAGNOSIS — R911 Solitary pulmonary nodule: Secondary | ICD-10-CM | POA: Diagnosis not present

## 2017-01-29 DIAGNOSIS — K5730 Diverticulosis of large intestine without perforation or abscess without bleeding: Secondary | ICD-10-CM | POA: Diagnosis not present

## 2017-01-29 DIAGNOSIS — R918 Other nonspecific abnormal finding of lung field: Secondary | ICD-10-CM | POA: Diagnosis not present

## 2017-02-04 ENCOUNTER — Encounter: Payer: PPO | Admitting: *Deleted

## 2017-02-04 DIAGNOSIS — J449 Chronic obstructive pulmonary disease, unspecified: Secondary | ICD-10-CM | POA: Diagnosis not present

## 2017-02-04 NOTE — Progress Notes (Signed)
Daily Session Note  Patient Details  Name: Stanley Rios. MRN: [id number] Date of Birth: Jun 08, 1956 Referring Provider:     Pulmonary Rehab from 01/05/2017 in [facility] Cardiac and Pulmonary Rehab  Referring Provider  [name] MD      Encounter Date: 02/04/2017  Check In:     Session Check In - 02/04/17 1158      Check-In   Location [facility]-Cardiac & Pulmonary Rehab   Staff Present Nyoka Cowden, RN, BSN, MA;Meredith Sherryll Burger, RN BSN;[name]   Supervising physician immediately available to respond to emergencies [name] immediately available ER MD   Physician(s) Dr. Kerman Passey and Quentin Cornwall   Medication changes reported     No   Fall or balance concerns reported    No   Warm-up and Cool-down Performed as group-led instruction   Resistance Training Performed Yes   VAD Patient? No     Pain Assessment   Currently in Pain? No/denies         History  Smoking Status  . Former Smoker  . Packs/day: 0.00  . Years: 40.00  . Types: Cigarettes  . Quit date: 05/28/2016  Smokeless Tobacco  . Former Systems developer    Comment: continues to use patches    Goals Met:  Proper associated with RPD/PD & O2 Sat Independence with exercise equipment Using PLB without cueing & demonstrates good technique Exercise tolerated well Strength training completed today  Goals Unmet:  Not Applicable  Comments: Pt able to follow exercise prescription today without complaint.  Will continue to monitor for progression.    Dr. Emily Filbert is Medical Director for [place] and [name].

## 2017-02-06 ENCOUNTER — Encounter: Payer: PPO | Admitting: *Deleted

## 2017-02-06 DIAGNOSIS — J189 Pneumonia, unspecified organism: Secondary | ICD-10-CM | POA: Diagnosis not present

## 2017-02-06 DIAGNOSIS — K458 Other specified abdominal hernia without obstruction or gangrene: Secondary | ICD-10-CM | POA: Diagnosis not present

## 2017-02-06 DIAGNOSIS — J449 Chronic obstructive pulmonary disease, unspecified: Secondary | ICD-10-CM

## 2017-02-06 NOTE — Progress Notes (Signed)
Daily Session Note  Patient Details  Name: Stanley Rios. MRN: [id number] Date of Birth: 1956-04-30 Referring Provider:     Pulmonary Rehab from 01/05/2017 in [facility] Cardiac and Pulmonary Rehab  Referring Provider  [name] MD      Encounter Date: 02/06/2017  Check In:     Session Check In - 02/06/17 1138      Check-In   Location [facility]-Cardiac & Pulmonary Rehab   Staff Present Nyoka Cowden, RN, BSN, MA;Naysha Sholl Sherryll Burger, RN BSN;[name]   Supervising physician immediately available to respond to emergencies [name] immediately available ER MD   Physician(s) Dr. Archie Balboa and Corky Downs   Medication changes reported     No   Fall or balance concerns reported    No   Warm-up and Cool-down Performed as group-led instruction   Resistance Training Performed Yes   VAD Patient? No     Pain Assessment   Currently in Pain? No/denies         History  Smoking Status  . Former Smoker  . Packs/day: 0.00  . Years: 40.00  . Types: Cigarettes  . Quit date: 05/28/2016  Smokeless Tobacco  . Former Systems developer    Comment: continues to use patches    Goals Met:  Proper associated with RPD/PD & O2 Sat Independence with exercise equipment Using PLB without cueing & demonstrates good technique Exercise tolerated well Strength training completed today  Goals Unmet:  Not Applicable  Comments: Pt able to follow exercise prescription today without complaint.  Will continue to monitor for progression.    Dr. Emily Filbert is Medical Director for [place] and [name].

## 2017-02-09 DIAGNOSIS — R001 Bradycardia, unspecified: Secondary | ICD-10-CM | POA: Diagnosis not present

## 2017-02-11 ENCOUNTER — Encounter: Payer: PPO | Admitting: *Deleted

## 2017-02-11 DIAGNOSIS — J449 Chronic obstructive pulmonary disease, unspecified: Secondary | ICD-10-CM

## 2017-02-11 NOTE — Progress Notes (Signed)
Daily Session Note  Patient Details  Name: Stanley Rios. MRN: [id number] Date of Birth: 08-Feb-1956 Referring Provider:     Pulmonary Rehab from 01/05/2017 in [facility] Cardiac and Pulmonary Rehab  Referring Provider  [name] MD      Encounter Date: 02/11/2017  Check In:     Session Check In - 02/11/17 1130      Check-In   Location [facility]-Cardiac & Pulmonary Rehab   Staff Present Alberteen Sam, MA, ACSM RCEP, Exercise Physiologist;Joseph Alcus Dad, RN BSN   Supervising physician immediately available to respond to emergencies [name] immediately available ER MD   Physician(s) Drs. Lord and [employer]   Medication changes reported     No   Fall or balance concerns reported    No   Warm-up and Cool-down Performed as group-led Location manager Performed Yes   VAD Patient? No     Pain Assessment   Currently in Pain? No/denies   Multiple Pain Sites No           Exercise Prescription Changes - 02/10/17 1600      Response to Exercise   Blood Pressure (Admit) 116/68   Blood Pressure (Exercise) 124/70   Blood Pressure (Exit) 112/60   Heart Rate (Admit) 108 bpm   Heart Rate (Exercise) 108 bpm   Heart Rate (Exit) 112 bpm   Oxygen Saturation (Admit) 91 %   Oxygen Saturation (Exercise) 94 %   Oxygen Saturation (Exit) 96 %   Rating of Perceived Exertion (Exercise) 13   Perceived Dyspnea (Exercise) 2   Symptoms SOB, fatigued   Duration Continue with 45 min of aerobic exercise without signs/symptoms of physical distress.   Intensity THRR unchanged     Progression   Progression Continue to progress workloads to maintain intensity without signs/symptoms of physical distress.   Average METs 1.76     Resistance Training   Training Prescription Yes   Weight 3 lbs   Reps 10-15     Interval Training   Interval Training No     Oxygen   Oxygen Continuous   Liters 3     Treadmill   MPH 1   Grade 0   Minutes 15   METs 1.77      NuStep   Level 2   SPM 59   Minutes 15   METs 1.8     REL-XR   Level 1   Minutes 15   METs 1.7      History  Smoking Status  . Former Smoker  . Packs/day: 0.00  . Years: 40.00  . Types: Cigarettes  . Quit date: 05/28/2016  Smokeless Tobacco  . Former Systems developer    Comment: continues to use patches    Goals Met:  Proper associated with RPD/PD & O2 Sat Independence with exercise equipment Using PLB without cueing & demonstrates good technique Exercise tolerated well Personal goals reviewed Strength training completed today  Goals Unmet:  Not Applicable  Comments: Pt able to follow exercise prescription today without complaint.  Will continue to monitor for progression.  Reviewed home exercise with pt today.  Pt plans to walk and go to [employer] for exercise.  Reviewed THR, pulse, RPE, sign and symptoms, and when to call 911 or MD.  Also discussed weather considerations and indoor options.  We also discussed how to slowly progress his home exercise.  Pt voiced understanding.    Dr. Emily Filbert is Medical Director for [place] and [name]  Pulmonary Rehabilitation.

## 2017-02-13 DIAGNOSIS — J449 Chronic obstructive pulmonary disease, unspecified: Secondary | ICD-10-CM

## 2017-02-13 NOTE — Progress Notes (Signed)
Daily Session Note  Patient Details  Name: Stanley Rios. MRN: [id number] Date of Birth: Jun 29, 1956 Referring Provider:     Pulmonary Rehab from 01/05/2017 in [facility] Cardiac and Pulmonary Rehab  Referring Provider  [name] MD      Encounter Date: 02/13/2017  Check In:     Session Check In - 02/13/17 1137      Check-In   Location [facility]-Cardiac & Pulmonary Rehab   Staff Present Alberteen Sam, MA, ACSM RCEP, Exercise Physiologist;Alieyah Spader Alcus Dad, RN BSN   Supervising physician immediately available to respond to emergencies [name] immediately available ER MD   Physician(s) Dr. Jacqualine Code and Corky Downs   Medication changes reported     No   Fall or balance concerns reported    No   Warm-up and Cool-down Performed as group-led instruction   Resistance Training Performed Yes   VAD Patient? No     Pain Assessment   Currently in Pain? No/denies   Multiple Pain Sites No         History  Smoking Status  . Former Smoker  . Packs/day: 0.00  . Years: 40.00  . Types: Cigarettes  . Quit date: 05/28/2016  Smokeless Tobacco  . Former Systems developer    Comment: continues to use patches    Goals Met:  Proper associated with RPD/PD & O2 Sat Independence with exercise equipment Using PLB without cueing & demonstrates good technique Exercise tolerated well Strength training completed today  Goals Unmet:  Not Applicable  Comments: Pt able to follow exercise prescription today without complaint.  Will continue to monitor for progression.   Dr. Emily Filbert is Medical Director for [place] and [name].

## 2017-02-16 DIAGNOSIS — J449 Chronic obstructive pulmonary disease, unspecified: Secondary | ICD-10-CM

## 2017-02-16 NOTE — Progress Notes (Signed)
Daily Session Note  Patient Details  Name: Stanley Rios. MRN: [id number] Date of Birth: Oct 05, 1955 Referring Provider:     Pulmonary Rehab from 01/05/2017 in [facility] Cardiac and Pulmonary Rehab  Referring Provider  [name] MD      Encounter Date: 02/16/2017  Check In:     Session Check In - 02/16/17 1156      Check-In   Location [facility]-Cardiac & Pulmonary Rehab   Staff Present [name], BA, ACSM CEP, Exercise Physiologist;[name], BS, ACSM CEP, Exercise Physiologist;[name]   Supervising physician immediately available to respond to emergencies [name] immediately available ER MD   Physician(s) Dr. Cinda Quest and Mariea Clonts   Medication changes reported     No   Fall or balance concerns reported    No   Tobacco Cessation No Change  patient has not smoked since last September   Warm-up and Cool-down Performed as group-led Location manager Performed Yes   VAD Patient? No     Pain Assessment   Currently in Pain? No/denies   Multiple Pain Sites No         History  Smoking Status  . Former Smoker  . Packs/day: 0.00  . Years: 40.00  . Types: Cigarettes  . Quit date: 05/28/2016  Smokeless Tobacco  . Former Systems developer    Comment: continues to use patches    Goals Met:  Proper associated with RPD/PD & O2 Sat Independence with exercise equipment Using PLB without cueing & demonstrates good technique Exercise tolerated well Strength training completed today  Goals Unmet:  Not Applicable  Comments: Pt able to follow exercise prescription today without complaint.  Will continue to monitor for progression.   Dr. Emily Filbert is Medical Director for [place] and [name].

## 2017-02-18 DIAGNOSIS — F17200 Nicotine dependence, unspecified, uncomplicated: Secondary | ICD-10-CM | POA: Diagnosis not present

## 2017-02-18 DIAGNOSIS — Z7952 Long term (current) use of systemic steroids: Secondary | ICD-10-CM | POA: Diagnosis not present

## 2017-02-18 DIAGNOSIS — F329 Major depressive disorder, single episode, unspecified: Secondary | ICD-10-CM | POA: Diagnosis not present

## 2017-02-18 DIAGNOSIS — Z9981 Dependence on supplemental oxygen: Secondary | ICD-10-CM | POA: Diagnosis not present

## 2017-02-18 DIAGNOSIS — E785 Hyperlipidemia, unspecified: Secondary | ICD-10-CM | POA: Diagnosis not present

## 2017-02-18 DIAGNOSIS — J431 Panlobular emphysema: Secondary | ICD-10-CM | POA: Diagnosis not present

## 2017-02-18 DIAGNOSIS — R911 Solitary pulmonary nodule: Secondary | ICD-10-CM | POA: Diagnosis not present

## 2017-02-18 DIAGNOSIS — Z79899 Other long term (current) drug therapy: Secondary | ICD-10-CM | POA: Diagnosis not present

## 2017-02-18 DIAGNOSIS — I10 Essential (primary) hypertension: Secondary | ICD-10-CM | POA: Diagnosis not present

## 2017-02-18 DIAGNOSIS — F419 Anxiety disorder, unspecified: Secondary | ICD-10-CM | POA: Diagnosis not present

## 2017-02-18 DIAGNOSIS — J449 Chronic obstructive pulmonary disease, unspecified: Secondary | ICD-10-CM | POA: Diagnosis not present

## 2017-02-18 DIAGNOSIS — J439 Emphysema, unspecified: Secondary | ICD-10-CM | POA: Diagnosis not present

## 2017-02-18 DIAGNOSIS — Z87891 Personal history of nicotine dependence: Secondary | ICD-10-CM | POA: Diagnosis not present

## 2017-02-18 DIAGNOSIS — R0602 Shortness of breath: Secondary | ICD-10-CM | POA: Diagnosis not present

## 2017-02-20 DIAGNOSIS — J449 Chronic obstructive pulmonary disease, unspecified: Secondary | ICD-10-CM | POA: Diagnosis not present

## 2017-02-20 NOTE — Progress Notes (Signed)
Daily Session Note  Patient Details  Name: Stanley Rios. MRN: [id number] Date of Birth: 1956-01-02 Referring Provider:     Pulmonary Rehab from 01/05/2017 in [facility] Cardiac and Pulmonary Rehab  Referring Provider  [name] MD      Encounter Date: 02/20/2017  Check In:     Session Check In - 02/20/17 1137      Check-In   Location [facility]-Cardiac & Pulmonary Rehab   Staff Present Alberteen Sam, MA, ACSM RCEP, Exercise Physiologist;Meredith Sherryll Burger, RN BSN;Caryn Gienger Flavia Shipper   Supervising physician immediately available to respond to emergencies [name] immediately available ER MD   Physician(s) Dr. Kerman Passey and Jimmye Norman   Medication changes reported     No   Fall or balance concerns reported    No   Warm-up and Cool-down Performed as group-led instruction   Resistance Training Performed Yes   VAD Patient? No     Pain Assessment   Currently in Pain? No/denies   Multiple Pain Sites No         History  Smoking Status  . Former Smoker  . Packs/day: 0.00  . Years: 40.00  . Types: Cigarettes  . Quit date: 05/28/2016  Smokeless Tobacco  . Former Systems developer    Comment: continues to use patches    Goals Met:  Proper associated with RPD/PD & O2 Sat Independence with exercise equipment Exercise tolerated well Strength training completed today  Goals Unmet:  Not Applicable  Comments: Pt able to follow exercise prescription today without complaint.  Will continue to monitor for progression.   Dr. Emily Filbert is Medical Director for [place] and [name].

## 2017-02-23 DIAGNOSIS — J449 Chronic obstructive pulmonary disease, unspecified: Secondary | ICD-10-CM

## 2017-02-23 DIAGNOSIS — R001 Bradycardia, unspecified: Secondary | ICD-10-CM | POA: Diagnosis not present

## 2017-02-23 NOTE — Progress Notes (Signed)
Daily Session Note  Patient Details  Name: Stanley Rios. MRN: [id number] Date of Birth: [birth year] Referring Provider:     Pulmonary Rehab from 01/05/2017 in [facility] Cardiac and Pulmonary Rehab  Referring Provider  [name] MD      Encounter Date: 02/23/2017  Check In:     Session Check In - 02/23/17 1154      Check-In   Location [facility]-Cardiac & Pulmonary Rehab   Staff Present [name], BA, ACSM CEP, Exercise Physiologist;[name], BS, ACSM CEP, Exercise Physiologist;Joy Reiger Flavia Shipper   Supervising physician immediately available to respond to emergencies [name] immediately available ER MD   Physician(s) Dr. Shirl Harris and Quentin Cornwall   Medication changes reported     No   Fall or balance concerns reported    No   Warm-up and Cool-down Performed as group-led instruction   Resistance Training Performed Yes   VAD Patient? No     Pain Assessment   Currently in Pain? No/denies   Multiple Pain Sites No         History  Smoking Status  . Former Smoker  . Packs/day: 0.00  . Years: 40.00  . Types: Cigarettes  . Quit date: 05/28/2016  Smokeless Tobacco  . Former Systems developer    Comment: continues to use patches    Goals Met:  Proper associated with RPD/PD & O2 Sat Independence with exercise equipment Exercise tolerated well Strength training completed today  Goals Unmet:  Not Applicable  Comments: Pt able to follow exercise prescription today without complaint.  Will continue to monitor for progression.   Dr. Emily Filbert is Medical Director for [place] and [name].

## 2017-02-23 NOTE — Progress Notes (Signed)
Pulmonary Individual Treatment Plan  Patient Details  Name: Stanley Rios. MRN: [id number] Date of Birth: September 15, 1955 Referring Provider:     Pulmonary Rehab from 01/05/2017 in [facility] Cardiac and Pulmonary Rehab  Referring Provider  [name] MD      Initial Encounter Date:    Pulmonary Rehab from 01/05/2017 in [facility] Cardiac and Pulmonary Rehab  Date  01/05/17  Referring Provider  [name] MD      Visit Diagnosis: Chronic obstructive pulmonary disease, unspecified COPD type ([place])  Patient's Home Medications on Admission:  Current Outpatient Prescriptions:  .  albuterol (PROAIR HFA) 108 (90 Base) MCG/ACT inhaler, INHALE 2 PUFFS EVERY 4 HOURS AS NEEDED, Disp: , Rfl:  .  albuterol (PROVENTIL HFA;VENTOLIN HFA) 108 (90 Base) MCG/ACT inhaler, Inhale 2 puffs into the lungs every 4 (four) hours as needed for wheezing or shortness of breath. Reported on 10/26/2015, Disp: , Rfl:  .  buPROPion (WELLBUTRIN) 75 MG tablet, Take 75 mg by mouth 2 (two) times daily. Reported on 10/26/2015, Disp: , Rfl:  .  Cholecalciferol (VITAMIN D3) 2000 units capsule, TK 1 C PO D, Disp: , Rfl: 0 .  Cyanocobalamin (VITAMELTS ENERGY VITAMIN B-12) 1500 MCG TBDP, Take 1 tablet by mouth daily., Disp: 180 tablet, Rfl: 0 .  cyclobenzaprine (FLEXERIL) 10 MG tablet, Take 1 tablet (10 mg total) by mouth 3 (three) times daily as needed for muscle spasms., Disp: 90 tablet, Rfl: 2 .  diltiazem (CARDIZEM SR) 90 MG 12 hr capsule, TAKE ONE CAPSULE BY MOUTH TWICE DAILY, Disp: , Rfl:  .  DULoxetine (CYMBALTA) 60 MG capsule, Take 60 mg by mouth daily. Reported on 10/26/2015, Disp: , Rfl:  .  finasteride (PROSCAR) 5 MG tablet, Take 5 mg by mouth daily., Disp: , Rfl:  .  fluticasone (FLONASE) 50 MCG/ACT nasal spray, SHAKE LIQUID AND USE 2 SPRAYS IN EACH NOSTRIL DAILY, Disp: , Rfl:  .  Fluticasone-Salmeterol (ADVAIR DISKUS) 250-50 MCG/DOSE AEPB, Inhale into the lungs., Disp: , Rfl:  .  furosemide (LASIX) 20 MG tablet, Take 1  tablet (20 mg total) by mouth daily. For 1 week and then when necessary as needed for swelling and edema, Disp: 30 tablet, Rfl: 0 .  gabapentin (NEURONTIN) 800 MG tablet, Take 1 tablet (800 mg total) by mouth every 8 (eight) hours., Disp: 90 tablet, Rfl: 2 .  ipratropium (ATROVENT HFA) 17 MCG/ACT inhaler, Inhale 2 puffs into the lungs every 6 (six) hours., Disp: , Rfl:  .  ipratropium-albuterol (DUONEB) 0.5-2.5 (3) MG/3ML SOLN, Take 3 mLs by nebulization every 4 (four) hours as needed., Disp: , Rfl:  .  lidocaine (XYLOCAINE) 5 % ointment, Apply 1 application topically See admin instructions. 2 to 4 times daily, Disp: , Rfl:  .  meloxicam (MOBIC) 15 MG tablet, Take 1 tablet (15 mg total) by mouth daily., Disp: 30 tablet, Rfl: 2 .  metoprolol tartrate (LOPRESSOR) 25 MG tablet, Take 1 tablet (25 mg total) by mouth 2 (two) times daily., Disp: 60 tablet, Rfl: 0 .  nicotine (NICODERM CQ - DOSED IN MG/24 HOURS) 21 mg/24hr patch, Place 21 mg onto the skin daily. , Disp: , Rfl:  .  omeprazole (PRILOSEC) 40 MG capsule, Take 40 mg by mouth 2 (two) times daily., Disp: , Rfl:  .  polyethylene glycol (MIRALAX / GLYCOLAX) packet, Take 17 g by mouth daily., Disp: , Rfl:  .  predniSONE (DELTASONE) 10 MG tablet, Take 1 tablet by mouth once., Disp: , Rfl:  .  simvastatin (  ZOCOR) 20 MG tablet, Take 20 mg by mouth at bedtime. , Disp: , Rfl:  .  tamsulosin (FLOMAX) 0.4 MG CAPS capsule, Take 0.4 mg by mouth daily., Disp: , Rfl:  .  tiotropium (SPIRIVA HANDIHALER) 18 MCG inhalation capsule, Place into inhaler and inhale., Disp: , Rfl:  .  traMADol (ULTRAM) 50 MG tablet, Take 1 tablet (50 mg total) by mouth every 6 (six) hours as needed., Disp: 120 tablet, Rfl: 2 .  traZODone (DESYREL) 50 MG tablet, Take 150 mg by mouth at bedtime., Disp: , Rfl:  .  Vitamin D, Ergocalciferol, (DRISDOL) 50000 units CAPS capsule, Take 1 capsule (50,000 Units total) by mouth 2 (two) times a week x 6 weeks., Disp: 12 capsule, Rfl: 0 .  zinc  oxide 20 % ointment, Apply topically., Disp: , Rfl:   Past Medical History: Past Medical History:  Diagnosis Date  . Anxiety   . Chronic back pain   . Chronic neck pain   . COPD (chronic obstructive pulmonary disease) ([place])   . Depression   . Hyperlipidemia   . Hypertension   . Opiate abuse, continuous   . Pneumonia     Tobacco Use: History  Smoking Status  . Former Smoker  . Packs/day: 0.00  . Years: 40.00  . Types: Cigarettes  . Quit date: 05/28/2016  Smokeless Tobacco  . 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 Rehab Latest Ref Rng & Units 01/16/2016   PHART 7.350 - 7.450 7.44   PCO2ART 32.0 - 48.0 mmHg 42   HCO3 21.0 - 28.0 mEq/L 28.5(H)   O2SAT % 88.3       ADL UCSD:     Pulmonary Assessment Scores    Row Name 01/05/17 1612         ADL UCSD   ADL Phase Entry     SOB Score total 52     Rest 0     Walk 2     Stairs 3     Bath 3     Dress 0     Shop 3       mMRC Score   mMRC Score 1        Pulmonary Function Assessment:     Pulmonary Function Assessment - 01/05/17 1609      Pulmonary Function Tests   FVC% 27 %   FEV1% 27 %   FEV1/FVC Ratio 74.48     Breath   Bilateral Breath Sounds Clear   Shortness of Breath Yes;Fear of Shortness of Breath;Limiting activity      Exercise Target Goals:    Exercise Program Goal: Individual exercise prescription set with THRR, safety & activity barriers. Participant demonstrates ability to understand and report RPE using BORG scale, to self-measure pulse accurately, and to acknowledge the importance of the exercise prescription.  Exercise Prescription Goal: Starting with aerobic activity 30 plus minutes a day, 3 days per week for initial exercise prescription. Provide home exercise prescription and guidelines that participant acknowledges understanding prior to discharge.  Activity Barriers & Risk Stratification:      Activity Barriers & Cardiac Risk Stratification - 01/05/17 1605      Activity Barriers & Cardiac Risk Stratification   Activity Barriers Back Problems;Deconditioning;Muscular Weakness;Shortness of Breath;Balance Concerns      6 Minute Walk:     6 Minute Walk    Row Name 01/05/17 1601  6 Minute Walk   Phase Initial     Distance 625 feet     Walk Time 4.13 minutes     # of Rest Breaks 2  57 sec, 55 sec     MPH 1.72     METS 2.47     RPE 15     Perceived Dyspnea  4     VO2 Peak 8.63     Symptoms Yes (comment)     Comments SOB, legs cramping     Resting HR 101 bpm     Resting BP 122/64     Max Ex. HR 114 bpm     Max Ex. BP 132/64     2 Minute Post BP 128/70       Interval HR   Baseline HR 101     1 Minute HR 104     2 Minute HR 103     3 Minute HR 108     4 Minute HR 109     5 Minute HR 112     6 Minute HR 114     2 Minute Post HR 109     Interval Heart Rate? Yes       Interval Oxygen   Interval Oxygen? Yes     Baseline Oxygen Saturation % 95 %     Baseline Liters of Oxygen 3 L     1 Minute Oxygen Saturation % 94 %     1 Minute Liters of Oxygen 3 L     2 Minute Oxygen Saturation % 94 %     2 Minute Liters of Oxygen 3 L     3 Minute Oxygen Saturation % 94 %     3 Minute Liters of Oxygen 3 L     4 Minute Oxygen Saturation % 96 %     4 Minute Liters of Oxygen 3 L     5 Minute Oxygen Saturation % 95 %     5 Minute Liters of Oxygen 3 L     6 Minute Oxygen Saturation % 95 %     6 Minute Liters of Oxygen 3 L     2 Minute Post Oxygen Saturation % 96 %     2 Minute Post Liters of Oxygen 3 L       Oxygen Initial Assessment:     Oxygen Initial Assessment - 01/05/17 1616      Home Oxygen   Home Oxygen Device Home Concentrator;E-Tanks   Sleep Oxygen Prescription Continuous   Liters per minute 3   Home Exercise Oxygen Prescription Continuous   Liters per minute 3   Home at Rest Exercise Oxygen Prescription Continuous   Liters per minute 3    Compliance with Home Oxygen Use Yes     Initial 6 min Walk   Oxygen Used Continuous;E-Tanks   Liters per minute 3   Resting Oxygen Saturation  during 6 min walk 95 %   Exercise Oxygen Saturation  during 6 min walk 94 %     Program Oxygen Prescription   Program Oxygen Prescription Continuous;E-Tanks   Liters per minute 3     Intervention   Short Term Goals To learn and understand importance of monitoring SPO2 with pulse oximeter and demonstrate accurate use of the pulse oximeter.;To learn and exhibit compliance with exercise, home and travel O2 prescription;To Learn and understand importance of maintaining oxygen saturations>88%   Long  Term Goals Exhibits compliance with exercise, home and travel O2 prescription;Maintenance of  O2 saturations>88%;Verbalizes importance of monitoring SPO2 with pulse oximeter and return demonstration      Oxygen Re-Evaluation:     Oxygen Re-Evaluation    Row Name 01/14/17 1524 02/11/17 1519           Program Oxygen Prescription   Program Oxygen Prescription  - Continuous;E-Tanks      Liters per minute  - 3        Home Oxygen   Home Oxygen Device  - E-Tanks;Home Concentrator      Sleep Oxygen Prescription  - Continuous      Liters per minute  - 3      Home Exercise Oxygen Prescription  - Continuous      Liters per minute  - 4      Home at Rest Exercise Oxygen Prescription  - Continuous      Liters per minute  - 3      Compliance with Home Oxygen Use  - Yes        Goals/Expected Outcomes   Short Term Goals To learn and demonstrate proper purse lipped breathing techniques or other breathing techniques. To Learn and understand importance of maintaining oxygen saturations>88%;To learn and demonstrate proper purse lipped breathing techniques or other breathing techniques.;To learn and understand importance of monitoring SPO2 with pulse oximeter and demonstrate accurate use of the pulse oximeter.;To learn and demonstrate proper use of respiratory  medications;To learn and exhibit compliance with exercise, home and travel O2 prescription      Long  Term Goals  - Exhibits compliance with exercise, home and travel O2 prescription;Maintenance of O2 saturations>88%;Compliance with respiratory medication;Exhibits proper breathing techniques, such as purse lipped breathing or other method taught during program session;Demonstrates proper use of MDI's;Verbalizes importance of monitoring SPO2 with pulse oximeter and return demonstration      Comments Stanley. Stanley Rios was taught how purse lip breathing is to help with his shortness of breath while exercising.  Stanley Rios has been doing well with his oxygen therapy.  He is compliant at home and while in program. He has had good O2 saturations.  He is using PLB fairly independent and has found it helpful at home too.  He needs more work on diaphragmatic breathing for relaxation and breath control.  He carries his pulse oximeter with him everywhere and checks his saturations when he is feeling low.  He has not had any problems with his medications and is doing a better job of keeping his spacer clean.      Goals/Expected Outcomes Short Term: Stanley Rios needs to be more proficient in  purse lipped breathing techniques. Long Term: Patient to be independent with purse lip breathing techniques. Short: Continue to work on breathing techniques.  Long: Continue to be independent with his oxygen therapy.         Oxygen Discharge (Final Oxygen Re-Evaluation):     Oxygen Re-Evaluation - 02/11/17 1519      Program Oxygen Prescription   Program Oxygen Prescription Continuous;E-Tanks   Liters per minute 3     Home Oxygen   Home Oxygen Device E-Tanks;Home Concentrator   Sleep Oxygen Prescription Continuous   Liters per minute 3   Home Exercise Oxygen Prescription Continuous   Liters per minute 4   Home at Rest Exercise Oxygen Prescription Continuous   Liters per minute 3   Compliance with Home Oxygen Use Yes      Goals/Expected Outcomes   Short Term Goals To Learn and understand importance of maintaining oxygen saturations>88%;To learn  and demonstrate proper purse lipped breathing techniques or other breathing techniques.;To learn and understand importance of monitoring SPO2 with pulse oximeter and demonstrate accurate use of the pulse oximeter.;To learn and demonstrate proper use of respiratory medications;To learn and exhibit compliance with exercise, home and travel O2 prescription   Long  Term Goals Exhibits compliance with exercise, home and travel O2 prescription;Maintenance of O2 saturations>88%;Compliance with respiratory medication;Exhibits proper breathing techniques, such as purse lipped breathing or other method taught during program session;Demonstrates proper use of MDI's;Verbalizes importance of monitoring SPO2 with pulse oximeter and return demonstration   Comments Stanley Rios has been doing well with his oxygen therapy.  He is compliant at home and while in program. He has had good O2 saturations.  He is using PLB fairly independent and has found it helpful at home too.  He needs more work on diaphragmatic breathing for relaxation and breath control.  He carries his pulse oximeter with him everywhere and checks his saturations when he is feeling low.  He has not had any problems with his medications and is doing a better job of keeping his spacer clean.   Goals/Expected Outcomes Short: Continue to work on breathing techniques.  Long: Continue to be independent with his oxygen therapy.      Initial Exercise Prescription:     Initial Exercise Prescription - 01/05/17 1600      Date of Initial Exercise RX and Referring Provider   Date 01/05/17   Referring Provider [name] MD     Oxygen   Oxygen Continuous   Liters 3     Treadmill   MPH 1.7   Grade 0   Minutes 15   METs 2.3     NuStep   Level 1   SPM 80   Minutes 15   METs 2     REL-XR   Level 1   Speed 50   Minutes 15    METs 2     Prescription Details   Frequency (times per week) 3   Duration Progress to 45 minutes of aerobic exercise without signs/symptoms of physical distress     Intensity   THRR 40-80% of Max Heartrate 124-147   Ratings of Perceived Exertion 11-13   Perceived Dyspnea 0-4     Progression   Progression Continue to progress workloads to maintain intensity without signs/symptoms of physical distress.     Resistance Training   Training Prescription Yes   Weight 3 lbs   Reps 10-15      Perform Capillary Blood Glucose checks as needed.  Exercise Prescription Changes:     Exercise Prescription Changes    Row Name 01/14/17 1400 01/27/17 1400 02/10/17 1600 02/11/17 1500       Response to Exercise   Blood Pressure (Admit) 142/74 130/66 116/68  -    Blood Pressure (Exercise) 142/80 128/70 124/70  -    Blood Pressure (Exit) 120/80 118/68 112/60  -    Heart Rate (Admit) 104 bpm 98 bpm 108 bpm  -    Heart Rate (Exercise) 118 bpm 105 bpm 108 bpm  -    Heart Rate (Exit) 105 bpm 102 bpm 112 bpm  -    Oxygen Saturation (Admit) 93 % 94 % 91 %  -    Oxygen Saturation (Exercise) 88 % 92 % 94 %  -    Oxygen Saturation (Exit) 97 % 97 % 96 %  -    Rating of Perceived Exertion (Exercise) '17 13 13  '$ -  Perceived Dyspnea (Exercise) '4 2 2  '$ -    Symptoms SOB, fatigued SOB, fatigued SOB, fatigued  -    Comments first full day of exercise  -  -  -    Duration Progress to 45 minutes of aerobic exercise without signs/symptoms of physical distress Progress to 45 minutes of aerobic exercise without signs/symptoms of physical distress Continue with 45 min of aerobic exercise without signs/symptoms of physical distress.  -    Intensity THRR unchanged THRR unchanged THRR unchanged  -      Progression   Progression Continue to progress workloads to maintain intensity without signs/symptoms of physical distress. Continue to progress workloads to maintain intensity without signs/symptoms of physical  distress. Continue to progress workloads to maintain intensity without signs/symptoms of physical distress.  -    Average METs 1.49 1.69 1.76  -      Resistance Training   Training Prescription Yes Yes Yes  -    Weight 3 lbs 3 lbs 3 lbs  -    Reps 10-15 10-15 10-15  -      Interval Training   Interval Training No No No  -      Oxygen   Oxygen Continuous Continuous Continuous  -    Liters '3 3 3  '$ -      Treadmill   MPH '1 1 1  '$ -    Grade 0 0 0  -    Minutes 4  2 min, 1:40 7  3 min 2 min 2 min 15  -    METs 1.77 1.77 1.77  -      NuStep   Level '1 1 2  '$ -    SPM 56 74 59  -    Minutes '15 15 15  '$ -    METs 1.5 2 1.8  -      REL-XR   Level '1 1 1  '$ -    Speed 30 39  -  -    Minutes '15 15 15  '$ -    METs 1.2 1.3 1.7  -      Home Exercise Plan   Plans to continue exercise at  -  -  - [employer] (comment)  [employer] and walking    Frequency  -  -  - Add 1 additional day to program exercise sessions.    Initial Home Exercises Provided  -  -  - 02/11/17       Exercise Comments:     Exercise Comments    Row Name 01/14/17 1456 01/14/17 1530         Exercise Comments First full day of exercise!  Patient was oriented to gym and equipment including functions, settings, policies, and procedures.  Patient's individual exercise prescription and treatment plan were reviewed.  All starting workloads were established based on the results of the 6 minute walk test done at initial orientation visit.  The plan for exercise progression was also introduced and progression will be customized based on patient's performance and goals. Stanley Rios was exercising on the treadmill and was short of breath after 2 minutes. He did intervals of about two minutes, pulse and spO2 was checked when regaining his breath. S HR stayed was reading in the 60's but then increased into the 100's. Two sat probes were used to determine if the reading was correct. He was placed on telemetry for any drops in  heart rate while exercising but now significant changes were noted.  Exercise Goals and Review:     Exercise Goals    Row Name 01/05/17 1607             Exercise Goals   Increase Physical Activity Yes       Intervention Provide advice, education, support and counseling about physical activity/exercise needs.;Develop an individualized exercise prescription for aerobic and resistive training based on initial evaluation findings, risk stratification, comorbidities and participant's personal goals.       Expected Outcomes Achievement of increased cardiorespiratory fitness and enhanced flexibility, muscular endurance and strength shown through measurements of functional capacity and personal statement of participant.       Increase Strength and Stamina Yes       Intervention Provide advice, education, support and counseling about physical activity/exercise needs.;Develop an individualized exercise prescription for aerobic and resistive training based on initial evaluation findings, risk stratification, comorbidities and participant's personal goals.       Expected Outcomes Achievement of increased cardiorespiratory fitness and enhanced flexibility, muscular endurance and strength shown through measurements of functional capacity and personal statement of participant.          Exercise Goals Re-Evaluation :     Exercise Goals Re-Evaluation    Row Name 01/27/17 1426 02/10/17 1615 02/11/17 1503         Exercise Goal Re-Evaluation   Exercise Goals Review Increase Physical Activity;Increase Strenth and Stamina Increase Physical Activity;Increase Strenth and Stamina Increase Physical Activity;Increase Strenth and Stamina     Comments Stanley Rios is off to a good start in rehab.  He missed a week due to doctor's appointments.  He is working on increasing his time on the treadmill.  Currently he is up to 7 min total.  We will continue to monitor his progress. Stanley Rios is doing well in rehab.  He  is getting stronger and able to do more.  He is now up ot 15 minutes on the treadmill!! He has also increased the NuStep to level 4.  We will continue to monitor his progression.   Stanley Rios feels like he is improving.  He is doing his 15 min on the treadmill.  He is feeling stronger and has more stamina already.  Reviewed home exercise with pt today.  Pt plans to walk and go to [employer] for exercise.  Reviewed THR, pulse, RPE, sign and symptoms, and when to call 911 or MD.  Also discussed weather considerations and indoor options.  We also discussed how to slowly progress his home exercise.  Pt voiced understanding.     Expected Outcomes Short: Continue to increase time on treadmill.  Long; Continue to attend class to work on strength and stamina. Short: Try to increase workload on XR and attend more regularly by scheduling appointments around rehab.  Long: Increase strength and stamina. Short: Add in at least extra day of exercise at home and to attend class regularly.  Long: Exercise more frequently.        Discharge Exercise Prescription (Final Exercise Prescription Changes):     Exercise Prescription Changes - 02/11/17 1500      Home Exercise Plan   Plans to continue exercise at [facility] (comment)  [employer] and walking   Frequency Add 1 additional day to program exercise sessions.   Initial Home Exercises Provided 02/11/17      Nutrition:  Target Goals: Understanding of nutrition guidelines, daily intake of sodium '1500mg'$ , cholesterol '200mg'$ , calories 30% from fat and 7% or less from saturated fats, daily to have 5  or more servings of fruits and vegetables.  Biometrics:     Pre Biometrics - 01/05/17 1607      Pre Biometrics   Height 5' 10.9" (1.801 m)   Weight 191 lb 9.6 oz (86.9 kg)   Waist Circumference 43 inches   Hip Circumference 37 inches   Waist to Hip Ratio 1.16 %   BMI (Calculated) 26.9       Nutrition Therapy Plan and Nutrition  Goals:   Nutrition Discharge: Rate Your Plate Scores:   Nutrition Goals Re-Evaluation:     Nutrition Goals Re-Evaluation    [place] Name 02/11/17 1526             Goals   Current Weight 199 lb (90.3 kg)       Comment Scheduled appt for 7/30 at 10am with dietician.  Would like to discuss diet for weight loss.           Nutrition Goals Discharge (Final Nutrition Goals Re-Evaluation):     Nutrition Goals Re-Evaluation - 02/11/17 1526      Goals   Current Weight 199 lb (90.3 kg)   Comment Scheduled appt for 7/30 at 10am with dietician.  Would like to discuss diet for weight loss.       Psychosocial: Target Goals: Acknowledge presence or absence of significant depression and/or stress, maximize coping skills, provide positive support system. Participant is able to verbalize types and ability to use techniques and skills needed for reducing stress and depression.   Initial Review & Psychosocial Screening:     Initial Psych Review & Screening - 01/05/17 [place]? Yes   Comments Stanley Rios has good support from his wife and one son. He states he has no depression and is looking forward to increasing his stamina and energy level through his participation in [place].     Barriers   Psychosocial barriers to participate in program There are no identifiable barriers or psychosocial needs.;The patient should benefit from training in stress management and relaxation.     Screening Interventions   Interventions Encouraged to exercise      Quality of Life Scores:     Quality of Life - 01/05/17 1626      Quality of Life Scores   Health/Function Pre 19.81 %   Socioeconomic Pre 19 %   Psych/Spiritual Pre 20.14 %   Family Pre 20.8 %   GLOBAL Pre 19.86 %      PHQ-9: Recent Review Flowsheet Data    Depression screen [facility] 2/9 01/22/2017 01/13/2017 01/05/2017 12/31/2016 12/17/2016   Decreased Interest 0 1 1 0 0   Down, Depressed, Hopeless 0 3  0 0 0   PHQ - 2 Score 0 4 1 0 0   Altered sleeping - 1 1 - -   Tired, decreased energy - 1 1 - -   Change in appetite - 0 0 - -   Feeling bad or failure about yourself  - 1 1 - -   Trouble concentrating - 0 0 - -   Moving slowly or fidgety/restless - 0 0 - -   Suicidal thoughts - 0 0 - -   PHQ-9 Score - 7 4 - -   Difficult doing work/chores - Somewhat difficult - - -     Interpretation of Total Score  Total Score Depression Severity:  1-4 = Minimal depression, 5-9 = Mild depression, 10-14 = Moderate depression, 15-19 = Moderately severe  depression, 20-27 = Severe depression   Psychosocial Evaluation and Intervention:     Psychosocial Evaluation - 01/26/17 1220      Psychosocial Evaluation & Interventions   Interventions Stress management education;Encouraged to exercise with the program and follow exercise prescription;Relaxation education   Comments Counselor met with Stanley Rios Stanley Rios) today for initial psychosocial evaluation.  He is a 61 year old who has been diagnosed with both COPD and Emphysema.  He also is concerned about a PET Scan for cancer screening in his lungs this Thursday.  He has a strong support system with a spouse of 72 years; a son locally and mother a step-sister who live next door.  Stanley Rios reports he has his days and nights mixed up when it comes to sleeping.  He is on medication to help with this.  He has a good appetite.  Stanley Rios reports he has a history of depression and is on medication that treats most of these symptoms, but is considering asking the Dr. to increase this.  He states his mood is typically positive.  Stanley Rios has multiple stressors in his life currently with the PET Scan this week; his own health and finances.  Stanley Rios has goals to improve his breathing; increase his mobility and ability to walk and possibly lose some weight.  Staff will be following with Stanley Rios throughout the course of this program.     Expected Outcomes Stanley Rios will benefit from consistent exercise  to achieve his stated goals.  He also will benefit from meeting with the dietician to address his weight loss goal.  Stanley Rios has multiple stressors in his life so participation in the psychoeducational components will help increase his coping strategies.  Counselor will be following with Stanley Rios.   Continue Psychosocial Services  Follow up required by counselor      Psychosocial Re-Evaluation:   Psychosocial Discharge (Final Psychosocial Re-Evaluation):   Education: Education Goals: Education classes will be provided on a weekly basis, covering required topics. Participant will state understanding/return demonstration of topics presented.  Learning Barriers/Preferences:     Learning Barriers/Preferences - 01/05/17 1608      Learning Barriers/Preferences   Learning Barriers None   Learning Preferences None      Education Topics: Initial Evaluation Education: - Verbal, written and demonstration of respiratory meds, RPE/PD scales, oximetry and breathing techniques. Instruction on use of nebulizers and MDIs: cleaning and proper use, rinsing mouth with steroid doses and importance of monitoring MDI activations.   Pulmonary Rehab from 02/20/2017 in [facility] Cardiac and Pulmonary Rehab  Date  01/05/17  Educator  LB  Instruction Review Code  2- meets goals/outcomes      General Nutrition Guidelines/Fats and Fiber: -Group instruction provided by verbal, written material, models and posters to present the general guidelines for heart healthy nutrition. Gives an explanation and review of dietary fats and fiber.   Controlling Sodium/Reading Food Labels: -Group verbal and written material supporting the discussion of sodium use in heart healthy nutrition. Review and explanation with models, verbal and written materials for utilization of the food label.   Pulmonary Rehab from 02/20/2017 in [facility] Cardiac and Pulmonary Rehab  Date  01/26/17  Educator  CR  Instruction Review Code  2- meets  goals/outcomes      Exercise Physiology & Risk Factors: - Group verbal and written instruction with models to review the exercise physiology of the cardiovascular system and associated critical values. Details cardiovascular disease risk factors and the goals associated with each risk factor.  Pulmonary Rehab from 02/20/2017 in [facility] Cardiac and Pulmonary Rehab  Date  02/04/17  Educator  AS  Instruction Review Code  2- meets goals/outcomes      Aerobic Exercise & Resistance Training: - Gives group verbal and written discussion on the health impact of inactivity. On the components of aerobic and resistive training programs and the benefits of this training and how to safely progress through these programs.   Pulmonary Rehab from 02/20/2017 in [facility] Cardiac and Pulmonary Rehab  Date  02/20/17  Educator  [facility]  Instruction Review Code  2- meets goals/outcomes      Flexibility, Balance, General Exercise Guidelines: - Provides group verbal and written instruction on the benefits of flexibility and balance training programs. Provides general exercise guidelines with specific guidelines to those with heart or lung disease. Demonstration and skill practice provided.   Stress Management: - Provides group verbal and written instruction about the health risks of elevated stress, cause of high stress, and healthy ways to reduce stress.   Depression: - Provides group verbal and written instruction on the correlation between heart/lung disease and depressed mood, treatment options, and the stigmas associated with seeking treatment.   Exercise & Equipment Safety: - Individual verbal instruction and demonstration of equipment use and safety with use of the equipment.   Pulmonary Rehab from 02/20/2017 in [facility] Cardiac and Pulmonary Rehab  Date  01/14/17  Educator  [facility]  Instruction Review Code  2- meets goals/outcomes      Infection Prevention: - Provides verbal and written material to individual  with discussion of infection control including proper hand washing and proper equipment cleaning during exercise session.   Pulmonary Rehab from 02/20/2017 in [facility] Cardiac and Pulmonary Rehab  Date  01/14/17  Educator  [facility]  Instruction Review Code  2- meets goals/outcomes      Falls Prevention: - Provides verbal and written material to individual with discussion of falls prevention and safety.   Pulmonary Rehab from 02/20/2017 in [facility] Cardiac and Pulmonary Rehab  Date  01/05/17  Educator  LB  Instruction Review Code  2- meets goals/outcomes      Diabetes: - Individual verbal and written instruction to review signs/symptoms of diabetes, desired ranges of glucose level fasting, after meals and with exercise. Advice that pre and post exercise glucose checks will be done for 3 sessions at entry of program.   Chronic Lung Diseases: - Group verbal and written instruction to review new updates, new respiratory medications, new advancements in procedures and treatments. Provide informative websites and "800" numbers of self-education.   Pulmonary Rehab from 02/20/2017 in [facility] Cardiac and Pulmonary Rehab  Date  02/11/17  Educator  [facility]  Instruction Review Code  2- meets goals/outcomes      Lung Procedures: - Group verbal and written instruction to describe testing methods done to diagnose lung disease. Review the outcome of test results. Describe the treatment choices: Pulmonary Function Tests, ABGs and oximetry.   Energy Conservation: - Provide group verbal and written instruction for methods to conserve energy, plan and organize activities. Instruct on pacing techniques, use of adaptive equipment and posture/positioning to relieve shortness of breath.   Triggers: - Group verbal and written instruction to review types of environmental controls: home humidity, furnaces, filters, dust mite/pet prevention, HEPA vacuums. To discuss weather changes, air quality and the benefits of nasal  washing.   Exacerbations: - Group verbal and written instruction to provide: warning signs, infection symptoms, calling MD promptly, preventive modes, and value of  vaccinations. Review: effective airway clearance, coughing and/or vibration techniques. Create an Sports administrator.   Oxygen: - Individual and group verbal and written instruction on oxygen therapy. Includes supplement oxygen, available portable oxygen systems, continuous and intermittent flow rates, oxygen safety, concentrators, and Medicare reimbursement for oxygen.   Pulmonary Rehab from 02/20/2017 in [facility] Cardiac and Pulmonary Rehab  Date  01/05/17  Educator  LB  Instruction Review Code  2- meets goals/outcomes      Respiratory Medications: - Group verbal and written instruction to review medications for lung disease. Drug class, frequency, complications, importance of spacers, rinsing mouth after steroid MDI's, and proper cleaning methods for nebulizers.   Pulmonary Rehab from 02/20/2017 in [facility] Cardiac and Pulmonary Rehab  Date  01/05/17  Educator  LB  Instruction Review Code  2- meets goals/outcomes      AED/CPR: - Group verbal and written instruction with the use of models to demonstrate the basic use of the AED with the basic ABC's of resuscitation.   Breathing Retraining: - Provides individuals verbal and written instruction on purpose, frequency, and proper technique of diaphragmatic breathing and pursed-lipped breathing. Applies individual practice skills.   Pulmonary Rehab from 02/20/2017 in [facility] Cardiac and Pulmonary Rehab  Date  01/05/17  Educator  LB  Instruction Review Code  2- meets goals/outcomes      Anatomy and Physiology of the Lungs: - Group verbal and written instruction with the use of models to provide basic lung anatomy and physiology related to function, structure and complications of lung disease.   Heart Failure: - Group verbal and written instruction on the basics of heart failure:  signs/symptoms, treatments, explanation of ejection fraction, enlarged heart and cardiomyopathy.   Sleep Apnea: - Individual verbal and written instruction to review Obstructive Sleep Apnea. Review of risk factors, methods for diagnosing and types of masks and machines for OSA.   Anxiety: - Provides group, verbal and written instruction on the correlation between heart/lung disease and anxiety, treatment options, and management of anxiety.   Relaxation: - Provides group, verbal and written instruction about the benefits of relaxation for patients with heart/lung disease. Also provides patients with examples of relaxation techniques.   Knowledge Questionnaire Score:     Knowledge Questionnaire Score - 01/05/17 1609      Knowledge Questionnaire Score   Pre Score 8/10       Core Components/Risk Factors/Patient Goals at Admission:     Personal Goals and Risk Factors at Admission - 01/05/17 1618      Core Components/Risk Factors/Patient Goals on Admission    Weight Management Yes;Weight Loss   Intervention Weight Management: Develop a combined nutrition and exercise program designed to reach desired caloric intake, while maintaining appropriate intake of nutrient and fiber, sodium and fats, and appropriate energy expenditure required for the weight goal.;Weight Management: Provide education and appropriate resources to help participant work on and attain dietary goals.   Admit Weight 191 lb 9.6 oz (86.9 kg)   Goal Weight: Short Term 186 lb (84.4 kg)   Goal Weight: Long Term 160 lb (72.6 kg)   Expected Outcomes Short Term: Continue to assess and modify interventions until short term weight is achieved;Weight Maintenance: Understanding of the daily nutrition guidelines, which includes 25-35% calories from fat, 7% or less cal from saturated fats, less than '200mg'$  cholesterol, less than 1.5gm of sodium, & 5 or more servings of fruits and vegetables daily;Weight Loss: Understanding of  general recommendations for a balanced deficit meal plan, which promotes 1-2  lb weight loss per week and includes a negative energy balance of [phone] kcal/d;Understanding recommendations for meals to include 15-35% energy as protein, 25-35% energy from fat, 35-60% energy from carbohydrates, less than '200mg'$  of dietary cholesterol, 20-35 gm of total fiber daily;Understanding of distribution of calorie intake throughout the day with the consumption of 4-5 meals/snacks   Tobacco Cessation Yes   Number of packs per day Quit 11/17; still using nicotine patches   Improve shortness of breath with ADL's Yes   Intervention Provide education, individualized exercise plan and daily activity instruction to help decrease symptoms of SOB with activities of daily living.   Expected Outcomes Short Term: Achieves a reduction of symptoms when performing activities of daily living.   Develop more efficient breathing techniques such as purse lipped breathing and diaphragmatic breathing; and practicing self-pacing with activity Yes   Intervention Provide education, demonstration and support about specific breathing techniuqes utilized for more efficient breathing. Include techniques such as pursed lipped breathing, diaphragmatic breathing and self-pacing activity.   Expected Outcomes Short Term: Participant will be able to demonstrate and use breathing techniques as needed throughout daily activities.   Increase knowledge of respiratory medications and ability to use respiratory devices properly  Yes  Advair, Spiriva, Atrovent, Albuterol SVN and MDI; spacer given   Intervention Provide education and demonstration as needed of appropriate use of medications, inhalers, and oxygen therapy.   Expected Outcomes Short Term: Achieves understanding of medications use. Understands that oxygen is a medication prescribed by physician. Demonstrates appropriate use of inhaler and oxygen therapy.   Hypertension Yes   Intervention  Provide education on lifestyle modifcations including regular physical activity/exercise, weight management, moderate sodium restriction and increased consumption of fresh fruit, vegetables, and low fat dairy, alcohol moderation, and smoking cessation.;Monitor prescription use compliance.   Expected Outcomes Short Term: Continued assessment and intervention until BP is < 140/38m HG in hypertensive participants. < 130/881mHG in hypertensive participants with diabetes, heart failure or chronic kidney disease.;Long Term: Maintenance of blood pressure at goal levels.      Core Components/Risk Factors/Patient Goals Review:      Goals and Risk Factor Review    Row Name 02/11/17 1508             Core Components/Risk Factors/Patient Goals Review   Personal Goals Review Hypertension;Tobacco Cessation;Weight Management/Obesity;Improve shortness of breath with ADL's       Review RiLiliane Channelas been doing well in rehab.  He is feeling stronger and already has more stamina as well.   He is able to do more at home with less SOB.  However, his weight is starting to trend up.  He has quit smoking completely and stopped using the patches a week ago!!  He is trying to eat better, but continues to see weight gain.  He did mention that his stomach is bothersome with both swelling and acid.  He also has lots of issues with consitpation with his hernias.  We discussed meeting with dietician and made him an appointment that his wife can also attend.  Stanley Rios checks his blood pressure regularly at home. He has not been recording his numbers but plans to start tracking to take with him to appointment with cardiologist on 8/3.         Expected Outcomes Short: Attend nutrition appointment and work on weight.  Long: Continue to work on risk factors modifications.          Core Components/Risk Factors/Patient Goals at Discharge (Final Review):  Goals and Risk Factor Review - 02/11/17 1508      Core Components/Risk  Factors/Patient Goals Review   Personal Goals Review Hypertension;Tobacco Cessation;Weight Management/Obesity;Improve shortness of breath with ADL's   Review Stanley Rios has been doing well in rehab.  He is feeling stronger and already has more stamina as well.   He is able to do more at home with less SOB.  However, his weight is starting to trend up.  He has quit smoking completely and stopped using the patches a week ago!!  He is trying to eat better, but continues to see weight gain.  He did mention that his stomach is bothersome with both swelling and acid.  He also has lots of issues with consitpation with his hernias.  We discussed meeting with dietician and made him an appointment that his wife can also attend.  Stanley Rios checks his blood pressure regularly at home. He has not been recording his numbers but plans to start tracking to take with him to appointment with cardiologist on 8/3.     Expected Outcomes Short: Attend nutrition appointment and work on weight.  Long: Continue to work on risk factors modifications.      ITP Comments:     ITP Comments    Row Name 01/26/17 0370 02/13/17 1140 02/23/17 0911       ITP Comments 30 day note review with Dr Emily Filbert, Medical Director of [place] Patient attended education "Know Your Numbers" 30 day review completed ITP sent to Dr. Ramonita Lab for Dr. Emily Filbert Director of [place]. Continue with ITP unless changes are made by physician.         Comments:

## 2017-02-25 ENCOUNTER — Encounter: Payer: PPO | Attending: Pulmonary Disease | Admitting: *Deleted

## 2017-02-25 DIAGNOSIS — J449 Chronic obstructive pulmonary disease, unspecified: Secondary | ICD-10-CM | POA: Insufficient documentation

## 2017-02-25 NOTE — Progress Notes (Signed)
Daily Session Note  Patient Details  Name: Stanley Rios. MRN: [id number] Date of Birth: Aug 31, 1955 Referring Provider:     Pulmonary Rehab from 01/05/2017 in [facility] Cardiac and Pulmonary Rehab  Referring Provider  [name] MD      Encounter Date: 02/25/2017  Check In:     Session Check In - 02/25/17 1126      Check-In   Location [facility]-Cardiac & Pulmonary Rehab   Staff Present Alberteen Sam, MA, ACSM RCEP, Exercise Physiologist;Joseph Alcus Dad, RN BSN   Supervising physician immediately available to respond to emergencies [name] immediately available ER MD   Physician(s) Dr. Mable Paris and Jacqualine Code   Medication changes reported     No   Fall or balance concerns reported    No   Warm-up and Cool-down Performed as group-led instruction   Resistance Training Performed Yes   VAD Patient? No     Pain Assessment   Currently in Pain? No/denies           Exercise Prescription Changes - 02/24/17 1500      Response to Exercise   Blood Pressure (Admit) 114/60   Blood Pressure (Exercise) 164/78   Blood Pressure (Exit) 120/76   Heart Rate (Admit) 102 bpm   Heart Rate (Exercise) 119 bpm   Heart Rate (Exit) 108 bpm   Oxygen Saturation (Admit) 97 %   Oxygen Saturation (Exercise) 92 %   Oxygen Saturation (Exit) 98 %   Rating of Perceived Exertion (Exercise) 15   Perceived Dyspnea (Exercise) 2   Symptoms fatigue   Duration Continue with 45 min of aerobic exercise without signs/symptoms of physical distress.   Intensity THRR unchanged     Progression   Progression Continue to progress workloads to maintain intensity without signs/symptoms of physical distress.   Average METs 2.26     Resistance Training   Training Prescription Yes   Weight 4 lbs   Reps 10-15     Interval Training   Interval Training No     Oxygen   Oxygen Continuous   Liters 3     Treadmill   MPH 1.4   Grade 0   Minutes 15   METs 2.07     NuStep   Level 5   SPM 68   Minutes 15   METs 2.4     REL-XR   Level 3   Speed 45   Minutes 15   METs 2.3     Home Exercise Plan   Plans to continue exercise at [employer] (comment)  [employer] and walking   Frequency Add 1 additional day to program exercise sessions.   Initial Home Exercises Provided 02/11/17      History  Smoking Status  . Former Smoker  . Packs/day: 0.00  . Years: 40.00  . Types: Cigarettes  . Quit date: 05/28/2016  Smokeless Tobacco  . Former Systems developer    Comment: continues to use patches    Goals Met:  Proper associated with RPD/PD & O2 Sat Independence with exercise equipment Using PLB without cueing & demonstrates good technique Exercise tolerated well Strength training completed today  Goals Unmet:  Not Applicable  Comments: Pt able to follow exercise prescription today without complaint.  Will continue to monitor for progression.    Dr. Emily Filbert is Medical Director for [place] and [name].

## 2017-02-26 DIAGNOSIS — R001 Bradycardia, unspecified: Secondary | ICD-10-CM | POA: Diagnosis not present

## 2017-02-27 ENCOUNTER — Encounter: Payer: PPO | Admitting: *Deleted

## 2017-02-27 DIAGNOSIS — J449 Chronic obstructive pulmonary disease, unspecified: Secondary | ICD-10-CM | POA: Diagnosis not present

## 2017-02-27 NOTE — Progress Notes (Signed)
Daily Session Note  Patient Details  Name: Stanley Rios. MRN: [id number] Date of Birth: Apr 07, 1956 Referring Provider:     Pulmonary Rehab from 01/05/2017 in [facility] Cardiac and Pulmonary Rehab  Referring Provider  [name] MD      Encounter Date: 02/27/2017  Check In:     Session Check In - 02/27/17 1138      Check-In   Location [facility]-Cardiac & Pulmonary Rehab   Staff Present Renita Papa, RN BSN;Joseph Darrin Nipper, [place], ACSM RCEP, Exercise Physiologist   Supervising physician immediately available to respond to emergencies [name] immediately available ER MD   Physician(s) Dr. Alfred Levins and Jimmye Norman    Medication changes reported     No   Fall or balance concerns reported    No   Warm-up and Cool-down Performed as group-led instruction   Resistance Training Performed Yes   VAD Patient? No     Pain Assessment   Currently in Pain? No/denies         History  Smoking Status  . Former Smoker  . Packs/day: 0.00  . Years: 40.00  . Types: Cigarettes  . Quit date: 05/28/2016  Smokeless Tobacco  . Former Systems developer    Comment: continues to use patches    Goals Met:  Proper associated with RPD/PD & O2 Sat Independence with exercise equipment Using PLB without cueing & demonstrates good technique Exercise tolerated well Strength training completed today  Goals Unmet:  Not Applicable  Comments: Pt able to follow exercise prescription today without complaint.  Will continue to monitor for progression.    Dr. Emily Filbert is Medical Director for [place] and [name].

## 2017-03-02 ENCOUNTER — Encounter: Payer: PPO | Admitting: Respiratory Therapy

## 2017-03-02 DIAGNOSIS — J449 Chronic obstructive pulmonary disease, unspecified: Secondary | ICD-10-CM

## 2017-03-02 NOTE — Progress Notes (Signed)
Daily Session Note  Patient Details  Name: Stanley Rios. MRN: [id number] Date of Birth: 05/29/1956 Referring Provider:     Pulmonary Rehab from 01/05/2017 in [facility] Cardiac and Pulmonary Rehab  Referring Provider  [name] MD      Encounter Date: 03/02/2017  Check In:     Session Check In - 03/02/17 1154      Check-In   Location [facility]-Cardiac & Pulmonary Rehab   Staff Present Earlean Shawl, BS, ACSM CEP, Exercise Physiologist;Len Kluver Clayborn Bigness, BS, RRT, Respiratory Therapist   Supervising physician immediately available to respond to emergencies [name] immediately available ER MD   Physician(s) Dr. Kerman Passey and Jimmye Norman   Medication changes reported     No   Fall or balance concerns reported    No   Warm-up and Cool-down Performed as group-led instruction   Resistance Training Performed Yes   VAD Patient? No     Pain Assessment   Currently in Pain? No/denies   Multiple Pain Sites No         History  Smoking Status  . Former Smoker  . Packs/day: 0.00  . Years: 40.00  . Types: Cigarettes  . Quit date: 05/28/2016  Smokeless Tobacco  . Former Systems developer    Comment: continues to use patches    Goals Met:  Proper associated with RPD/PD & O2 Sat Independence with exercise equipment Exercise tolerated well Strength training completed today  Goals Unmet:  Not Applicable  Comments: Pt able to follow exercise prescription today without complaint.  Will continue to monitor for progression.   Dr. Emily Filbert is Medical Director for [place] and [name].

## 2017-03-04 ENCOUNTER — Encounter: Payer: PPO | Admitting: *Deleted

## 2017-03-04 DIAGNOSIS — J449 Chronic obstructive pulmonary disease, unspecified: Secondary | ICD-10-CM | POA: Diagnosis not present

## 2017-03-04 NOTE — Progress Notes (Signed)
Daily Session Note  Patient Details  Name: Stanley C Vignola Jr. MRN: [id number] Date of Birth: 02/10/1956 Referring Provider:     Pulmonary Rehab from 01/05/2017 in [facility] Cardiac and Pulmonary Rehab  Referring Provider  G[place]rap, Daniel MD      Encounter Date: 03/04/2017  Check In:     Session Check In - 03/04/17 1138      Check-In   Location [facility]-Cardiac & Pulmonary Rehab   Staff Present Susanne Bice, RN, BSN, CCRP;Meredith Craven, RN BSN;Joseph Hood RCP,RRT,BSRT   Supervising physician immediately available to respond to emergencies [name] immediately available ER MD   Physician(s) Dr. Williams and Goodman   Medication changes reported     No   Fall or balance concerns reported    No   Tobacco Cessation No Change   Warm-up and Cool-down Performed as group-led instruction   Resistance Training Performed Yes   VAD Patient? No     Pain Assessment   Currently in Pain? No/denies         History  Smoking Status  . Former Smoker  . Packs/day: 0.00  . Years: 40.00  . Types: Cigarettes  . Quit date: 05/28/2016  Smokeless Tobacco  . Former User    Comment: continues to use patches    Goals Met:  Proper associated with RPD/PD & O2 Sat Independence with exercise equipment Using PLB without cueing & demonstrates good technique Exercise tolerated well Personal goals reviewed Strength training completed today  Goals Unmet:  Not Applicable  Comments: Pt able to follow exercise prescription today without complaint.  Will continue to monitor for progression.    Dr. Mark Miller is Medical Director for [facility] and [name]. 

## 2017-03-06 DIAGNOSIS — J449 Chronic obstructive pulmonary disease, unspecified: Secondary | ICD-10-CM

## 2017-03-06 NOTE — Progress Notes (Signed)
Daily Session Note  Patient Details  Name: Stanley Rios. MRN: [id number] Date of Birth: 07/13/1956 Referring Provider:     Pulmonary Rehab from 01/05/2017 in [facility] Cardiac and Pulmonary Rehab  Referring Provider  [name] MD      Encounter Date: 03/06/2017  Check In:     Session Check In - 03/06/17 1127      Check-In   Location [facility]-Cardiac & Pulmonary Rehab   Staff Present Alberteen Sam, MA, ACSM RCEP, Exercise Physiologist;Meredith Sherryll Burger, RN BSN;Lorrie Gargan Flavia Shipper   Supervising physician immediately available to respond to emergencies [name] immediately available ER MD   Physician(s) Drs. Quale and Kinner   Medication changes reported     No   Fall or balance concerns reported    No   Warm-up and Cool-down Performed as group-led Location manager Performed Yes   VAD Patient? No     Pain Assessment   Currently in Pain? No/denies   Multiple Pain Sites No         History  Smoking Status  . Former Smoker  . Packs/day: 0.00  . Years: 40.00  . Types: Cigarettes  . Quit date: 05/28/2016  Smokeless Tobacco  . Former Systems developer    Comment: continues to use patches    Goals Met:  Proper associated with RPD/PD & O2 Sat Independence with exercise equipment Exercise tolerated well No report of cardiac concerns or symptoms Strength training completed today  Goals Unmet:  Not Applicable  Comments: Pt able to follow exercise prescription today without complaint.  Will continue to monitor for progression.   Dr. Emily Filbert is Medical Director for [facility] and [name].

## 2017-03-09 DIAGNOSIS — J189 Pneumonia, unspecified organism: Secondary | ICD-10-CM | POA: Diagnosis not present

## 2017-03-09 DIAGNOSIS — K458 Other specified abdominal hernia without obstruction or gangrene: Secondary | ICD-10-CM | POA: Diagnosis not present

## 2017-03-09 DIAGNOSIS — J449 Chronic obstructive pulmonary disease, unspecified: Secondary | ICD-10-CM

## 2017-03-09 NOTE — Progress Notes (Signed)
Daily Session Note  Patient Details  Name: Stanley Rios. MRN: [id number] Date of Birth: 1955/12/27 Referring Provider:     Pulmonary Rehab from 01/05/2017 in [facility] Cardiac and Pulmonary Rehab  Referring Provider  [name] MD      Encounter Date: 03/09/2017  Check In:     Session Check In - 03/09/17 1210      Check-In   Location [facility]-Cardiac & Pulmonary Rehab   Staff Present [name], BA, ACSM CEP, Exercise Physiologist;[name], BS, ACSM CEP, Exercise Physiologist;Greco Gastelum Flavia Shipper   Supervising physician immediately available to respond to emergencies [name] immediately available ER MD   Physician(s) Patient has slight episode of dizziness and has already talked to her ear doctor.   Medication changes reported     No   Fall or balance concerns reported    No   Warm-up and Cool-down Performed as group-led instruction   Resistance Training Performed Yes   VAD Patient? No     Pain Assessment   Currently in Pain? No/denies   Multiple Pain Sites No         History  Smoking Status  . Former Smoker  . Packs/day: 0.00  . Years: 40.00  . Types: Cigarettes  . Quit date: 05/28/2016  Smokeless Tobacco  . Former Systems developer    Comment: continues to use patches    Goals Met:  Proper associated with RPD/PD & O2 Sat Independence with exercise equipment Exercise tolerated well No report of cardiac concerns or symptoms Strength training completed today  Goals Unmet:  Not Applicable  Comments: Pt able to follow exercise prescription today without complaint.  Will continue to monitor for progression.   Dr. Emily Filbert is Medical Director for [place] and [name].

## 2017-03-11 DIAGNOSIS — R911 Solitary pulmonary nodule: Secondary | ICD-10-CM | POA: Diagnosis not present

## 2017-03-11 DIAGNOSIS — F419 Anxiety disorder, unspecified: Secondary | ICD-10-CM | POA: Diagnosis not present

## 2017-03-11 DIAGNOSIS — Z510 Encounter for antineoplastic radiation therapy: Secondary | ICD-10-CM | POA: Diagnosis not present

## 2017-03-11 DIAGNOSIS — Z79891 Long term (current) use of opiate analgesic: Secondary | ICD-10-CM | POA: Diagnosis not present

## 2017-03-11 DIAGNOSIS — G8929 Other chronic pain: Secondary | ICD-10-CM | POA: Diagnosis not present

## 2017-03-11 DIAGNOSIS — J449 Chronic obstructive pulmonary disease, unspecified: Secondary | ICD-10-CM | POA: Diagnosis not present

## 2017-03-11 DIAGNOSIS — F329 Major depressive disorder, single episode, unspecified: Secondary | ICD-10-CM | POA: Diagnosis not present

## 2017-03-11 DIAGNOSIS — Z87891 Personal history of nicotine dependence: Secondary | ICD-10-CM | POA: Diagnosis not present

## 2017-03-11 DIAGNOSIS — M1990 Unspecified osteoarthritis, unspecified site: Secondary | ICD-10-CM | POA: Diagnosis not present

## 2017-03-11 DIAGNOSIS — Z7951 Long term (current) use of inhaled steroids: Secondary | ICD-10-CM | POA: Diagnosis not present

## 2017-03-11 DIAGNOSIS — C3411 Malignant neoplasm of upper lobe, right bronchus or lung: Secondary | ICD-10-CM | POA: Diagnosis not present

## 2017-03-11 DIAGNOSIS — I10 Essential (primary) hypertension: Secondary | ICD-10-CM | POA: Diagnosis not present

## 2017-03-11 DIAGNOSIS — Z8249 Family history of ischemic heart disease and other diseases of the circulatory system: Secondary | ICD-10-CM | POA: Diagnosis not present

## 2017-03-11 DIAGNOSIS — Z79899 Other long term (current) drug therapy: Secondary | ICD-10-CM | POA: Diagnosis not present

## 2017-03-11 DIAGNOSIS — K469 Unspecified abdominal hernia without obstruction or gangrene: Secondary | ICD-10-CM | POA: Diagnosis not present

## 2017-03-11 DIAGNOSIS — Z9889 Other specified postprocedural states: Secondary | ICD-10-CM | POA: Diagnosis not present

## 2017-03-11 DIAGNOSIS — M549 Dorsalgia, unspecified: Secondary | ICD-10-CM | POA: Diagnosis not present

## 2017-03-13 DIAGNOSIS — C3411 Malignant neoplasm of upper lobe, right bronchus or lung: Secondary | ICD-10-CM | POA: Diagnosis not present

## 2017-03-16 DIAGNOSIS — J449 Chronic obstructive pulmonary disease, unspecified: Secondary | ICD-10-CM

## 2017-03-16 NOTE — Progress Notes (Signed)
Daily Session Note  Patient Details  Name: Stanley Rios. MRN: [id number] Date of Birth: Mar 29, 1956 Referring Provider:     Pulmonary Rehab from 01/05/2017 in [facility] Cardiac and Pulmonary Rehab  Referring Provider  [name] MD      Encounter Date: 03/16/2017  Check In:     Session Check In - 03/16/17 1139      Check-In   Location [facility]-Cardiac & Pulmonary Rehab   Staff Present Earlean Shawl, BS, ACSM CEP, Exercise Physiologist;Laureen Owens Shark, BS, RRT, Respiratory Therapist;Taisia Fantini Hope physician immediately available to respond to emergencies [name] immediately available ER MD   Physician(s) Dr. Mable Paris and Jimmye Norman   Medication changes reported     No   Fall or balance concerns reported    No   Warm-up and Cool-down Performed as group-led instruction   Resistance Training Performed Yes   VAD Patient? No     Pain Assessment   Currently in Pain? No/denies   Multiple Pain Sites No         History  Smoking Status  . Former Smoker  . Packs/day: 0.00  . Years: 40.00  . Types: Cigarettes  . Quit date: 05/28/2016  Smokeless Tobacco  . Former Systems developer    Comment: continues to use patches    Goals Met:  Proper associated with RPD/PD & O2 Sat Independence with exercise equipment Exercise tolerated well No report of cardiac concerns or symptoms Strength training completed today  Goals Unmet:  Not Applicable  Comments: Pt able to follow exercise prescription today without complaint.  Will continue to monitor for progression.   Dr. Emily Filbert is Medical Director for [place] and [name].

## 2017-03-18 DIAGNOSIS — J449 Chronic obstructive pulmonary disease, unspecified: Secondary | ICD-10-CM

## 2017-03-18 NOTE — Progress Notes (Signed)
Daily Session Note  Patient Details  Name: Stanley Rios. MRN: [id number] Date of Birth: 10-02-1955 Referring Provider:     Pulmonary Rehab from 01/05/2017 in [facility] Cardiac and Pulmonary Rehab  Referring Provider  [name] MD      Encounter Date: 03/18/2017  Check In:     Session Check In - 03/18/17 1132      Check-In   Location [facility]-Cardiac & Pulmonary Rehab   Staff Present Alberteen Sam, MA, ACSM RCEP, Exercise Physiologist;Takiah Maiden Alcus Dad, RN BSN   Supervising physician immediately available to respond to emergencies [name] immediately available ER MD   Physician(s) Dr. Jimmye Norman and Mariea Clonts   Medication changes reported     No   Fall or balance concerns reported    No   Warm-up and Cool-down Performed as group-led instruction   Resistance Training Performed Yes   VAD Patient? No     Pain Assessment   Currently in Pain? No/denies   Multiple Pain Sites No         History  Smoking Status  . Former Smoker  . Packs/day: 0.00  . Years: 40.00  . Types: Cigarettes  . Quit date: 05/28/2016  Smokeless Tobacco  . Former Systems developer    Comment: continues to use patches    Goals Met:  Proper associated with RPD/PD & O2 Sat Independence with exercise equipment Exercise tolerated well No report of cardiac concerns or symptoms Strength training completed today  Goals Unmet:  Not Applicable  Comments: Pt able to follow exercise prescription today without complaint.  Will continue to monitor for progression.   Dr. Emily Filbert is Medical Director for [name] and [name].

## 2017-03-20 DIAGNOSIS — J449 Chronic obstructive pulmonary disease, unspecified: Secondary | ICD-10-CM | POA: Diagnosis not present

## 2017-03-20 NOTE — Progress Notes (Signed)
Daily Session Note  Patient Details  Name: Stanley Rios. MRN: [id number] Date of Birth: [birth year] Referring Provider:     Pulmonary Rehab from 01/05/2017 in [facility] Cardiac and Pulmonary Rehab  Referring Provider  [name] MD      Encounter Date: 03/20/2017  Check In:     Session Check In - 03/20/17 1150      Check-In   Location [facility]-Cardiac & Pulmonary Rehab   Staff Present Alberteen Sam, MA, ACSM RCEP, Exercise Physiologist;Amaira Safley Alcus Dad, RN BSN   Supervising physician immediately available to respond to emergencies [name] immediately available ER MD   Physician(s) Dr. Burlene Arnt and Alfred Levins   Medication changes reported     No   Fall or balance concerns reported    No   Warm-up and Cool-down Performed as group-led instruction   Resistance Training Performed Yes   VAD Patient? No     Pain Assessment   Currently in Pain? No/denies   Multiple Pain Sites No         History  Smoking Status  . Former Smoker  . Packs/day: 0.00  . Years: 40.00  . Types: Cigarettes  . Quit date: 05/28/2016  Smokeless Tobacco  . Former Systems developer    Comment: continues to use patches    Goals Met:  Proper associated with RPD/PD & O2 Sat Independence with exercise equipment Exercise tolerated well No report of cardiac concerns or symptoms Strength training completed today  Goals Unmet:  Not Applicable  Comments: Pt able to follow exercise prescription today without complaint.  Will continue to monitor for progression.   Dr. Emily Filbert is Medical Director for [place] and [name].

## 2017-03-23 DIAGNOSIS — J449 Chronic obstructive pulmonary disease, unspecified: Secondary | ICD-10-CM | POA: Diagnosis not present

## 2017-03-23 NOTE — Progress Notes (Signed)
Daily Session Note  Patient Details  Name: Stanley Rios. MRN: [id number] Date of Birth: 1956-02-05 Referring Provider:     Pulmonary Rehab from 01/05/2017 in [facility] Cardiac and Pulmonary Rehab  Referring Provider  [name] MD      Encounter Date: 03/23/2017  Check In:     Session Check In - 03/23/17 1148      Check-In   Location [facility]-Cardiac & Pulmonary Rehab   Staff Present [name], BA, ACSM CEP, Exercise Physiologist;[name], BS, ACSM CEP, Exercise Physiologist;Terilyn Sano Flavia Shipper   Supervising physician immediately available to respond to emergencies [name] immediately available ER MD   Physician(s) Dr. Mable Paris and Jimmye Norman   Medication changes reported     No   Fall or balance concerns reported    No   Warm-up and Cool-down Performed as group-led instruction   Resistance Training Performed Yes   VAD Patient? No     Pain Assessment   Currently in Pain? No/denies   Multiple Pain Sites No         History  Smoking Status  . Former Smoker  . Packs/day: 0.00  . Years: 40.00  . Types: Cigarettes  . Quit date: 05/28/2016  Smokeless Tobacco  . Former Systems developer    Comment: continues to use patches    Goals Met:  Proper associated with RPD/PD & O2 Sat Independence with exercise equipment Exercise tolerated well No report of cardiac concerns or symptoms Strength training completed today  Goals Unmet:  Not Applicable  Comments: Pt able to follow exercise prescription today without complaint.  Will continue to monitor for progression.   Dr. Emily Filbert is Medical Director for [place] and [name].

## 2017-03-23 NOTE — Progress Notes (Signed)
Pulmonary Individual Treatment Plan  Patient Details  Name: Stanley Rios. MRN: [id number] Date of Birth: September 15, 1955 Referring Provider:     Pulmonary Rehab from 01/05/2017 in [facility] Cardiac and Pulmonary Rehab  Referring Provider  [name] MD      Initial Encounter Date:    Pulmonary Rehab from 01/05/2017 in [facility] Cardiac and Pulmonary Rehab  Date  01/05/17  Referring Provider  [name] MD      Visit Diagnosis: Chronic obstructive pulmonary disease, unspecified COPD type ([place])  Patient's Home Medications on Admission:  Current Outpatient Prescriptions:  .  albuterol (PROAIR HFA) 108 (90 Base) MCG/ACT inhaler, INHALE 2 PUFFS EVERY 4 HOURS AS NEEDED, Disp: , Rfl:  .  albuterol (PROVENTIL HFA;VENTOLIN HFA) 108 (90 Base) MCG/ACT inhaler, Inhale 2 puffs into the lungs every 4 (four) hours as needed for wheezing or shortness of breath. Reported on 10/26/2015, Disp: , Rfl:  .  buPROPion (WELLBUTRIN) 75 MG tablet, Take 75 mg by mouth 2 (two) times daily. Reported on 10/26/2015, Disp: , Rfl:  .  Cholecalciferol (VITAMIN D3) 2000 units capsule, TK 1 C PO D, Disp: , Rfl: 0 .  Cyanocobalamin (VITAMELTS ENERGY VITAMIN B-12) 1500 MCG TBDP, Take 1 tablet by mouth daily., Disp: 180 tablet, Rfl: 0 .  cyclobenzaprine (FLEXERIL) 10 MG tablet, Take 1 tablet (10 mg total) by mouth 3 (three) times daily as needed for muscle spasms., Disp: 90 tablet, Rfl: 2 .  diltiazem (CARDIZEM SR) 90 MG 12 hr capsule, TAKE ONE CAPSULE BY MOUTH TWICE DAILY, Disp: , Rfl:  .  DULoxetine (CYMBALTA) 60 MG capsule, Take 60 mg by mouth daily. Reported on 10/26/2015, Disp: , Rfl:  .  finasteride (PROSCAR) 5 MG tablet, Take 5 mg by mouth daily., Disp: , Rfl:  .  fluticasone (FLONASE) 50 MCG/ACT nasal spray, SHAKE LIQUID AND USE 2 SPRAYS IN EACH NOSTRIL DAILY, Disp: , Rfl:  .  Fluticasone-Salmeterol (ADVAIR DISKUS) 250-50 MCG/DOSE AEPB, Inhale into the lungs., Disp: , Rfl:  .  furosemide (LASIX) 20 MG tablet, Take 1  tablet (20 mg total) by mouth daily. For 1 week and then when necessary as needed for swelling and edema, Disp: 30 tablet, Rfl: 0 .  gabapentin (NEURONTIN) 800 MG tablet, Take 1 tablet (800 mg total) by mouth every 8 (eight) hours., Disp: 90 tablet, Rfl: 2 .  ipratropium (ATROVENT HFA) 17 MCG/ACT inhaler, Inhale 2 puffs into the lungs every 6 (six) hours., Disp: , Rfl:  .  ipratropium-albuterol (DUONEB) 0.5-2.5 (3) MG/3ML SOLN, Take 3 mLs by nebulization every 4 (four) hours as needed., Disp: , Rfl:  .  lidocaine (XYLOCAINE) 5 % ointment, Apply 1 application topically See admin instructions. 2 to 4 times daily, Disp: , Rfl:  .  meloxicam (MOBIC) 15 MG tablet, Take 1 tablet (15 mg total) by mouth daily., Disp: 30 tablet, Rfl: 2 .  metoprolol tartrate (LOPRESSOR) 25 MG tablet, Take 1 tablet (25 mg total) by mouth 2 (two) times daily., Disp: 60 tablet, Rfl: 0 .  nicotine (NICODERM CQ - DOSED IN MG/24 HOURS) 21 mg/24hr patch, Place 21 mg onto the skin daily. , Disp: , Rfl:  .  omeprazole (PRILOSEC) 40 MG capsule, Take 40 mg by mouth 2 (two) times daily., Disp: , Rfl:  .  polyethylene glycol (MIRALAX / GLYCOLAX) packet, Take 17 g by mouth daily., Disp: , Rfl:  .  predniSONE (DELTASONE) 10 MG tablet, Take 1 tablet by mouth once., Disp: , Rfl:  .  simvastatin (  ZOCOR) 20 MG tablet, Take 20 mg by mouth at bedtime. , Disp: , Rfl:  .  tamsulosin (FLOMAX) 0.4 MG CAPS capsule, Take 0.4 mg by mouth daily., Disp: , Rfl:  .  tiotropium (SPIRIVA HANDIHALER) 18 MCG inhalation capsule, Place into inhaler and inhale., Disp: , Rfl:  .  traMADol (ULTRAM) 50 MG tablet, Take 1 tablet (50 mg total) by mouth every 6 (six) hours as needed., Disp: 120 tablet, Rfl: 2 .  traZODone (DESYREL) 50 MG tablet, Take 150 mg by mouth at bedtime., Disp: , Rfl:  .  Vitamin D, Ergocalciferol, (DRISDOL) 50000 units CAPS capsule, Take 1 capsule (50,000 Units total) by mouth 2 (two) times a week x 6 weeks., Disp: 12 capsule, Rfl: 0 .  zinc  oxide 20 % ointment, Apply topically., Disp: , Rfl:   Past Medical History: Past Medical History:  Diagnosis Date  . Anxiety   . Chronic back pain   . Chronic neck pain   . COPD (chronic obstructive pulmonary disease) ([place])   . Depression   . Hyperlipidemia   . Hypertension   . Opiate abuse, continuous   . Pneumonia     Tobacco Use: History  Smoking Status  . Former Smoker  . Packs/day: 0.00  . Years: 40.00  . Types: Cigarettes  . Quit date: 05/28/2016  Smokeless Tobacco  . 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 Rehab Latest Ref Rng & Units 01/16/2016   PHART 7.350 - 7.450 7.44   PCO2ART 32.0 - 48.0 mmHg 42   HCO3 21.0 - 28.0 mEq/L 28.5(H)   O2SAT % 88.3       Pulmonary Assessment Scores:     Pulmonary Assessment Scores    Row Name 01/05/17 1612 03/04/17 1236       ADL UCSD   ADL Phase Entry Mid    SOB Score total 52 34    Rest 0 0    Walk 2 1    Stairs 3 2    Bath 3 4    Dress 0 1    Shop 3 2      mMRC Score   mMRC Score 1  -       Pulmonary Function Assessment:     Pulmonary Function Assessment - 01/05/17 1609      Pulmonary Function Tests   FVC% 27 %   FEV1% 27 %   FEV1/FVC Ratio 74.48     Breath   Bilateral Breath Sounds Clear   Shortness of Breath Yes;Fear of Shortness of Breath;Limiting activity      Exercise Target Goals:    Exercise Program Goal: Individual exercise prescription set with THRR, safety & activity barriers. Participant demonstrates ability to understand and report RPE using BORG scale, to self-measure pulse accurately, and to acknowledge the importance of the exercise prescription.  Exercise Prescription Goal: Starting with aerobic activity 30 plus minutes a day, 3 days per week for initial exercise prescription. Provide home exercise prescription and guidelines that participant acknowledges understanding prior to  discharge.  Activity Barriers & Risk Stratification:     Activity Barriers & Cardiac Risk Stratification - 01/05/17 1605      Activity Barriers & Cardiac Risk Stratification   Activity Barriers Back Problems;Deconditioning;Muscular Weakness;Shortness of Breath;Balance Concerns      6 Minute Walk:     6 Minute Walk    Row Name 01/05/17  1601         6 Minute Walk   Phase Initial     Distance 625 feet     Walk Time 4.13 minutes     # of Rest Breaks 2  57 sec, 55 sec     MPH 1.72     METS 2.47     RPE 15     Perceived Dyspnea  4     VO2 Peak 8.63     Symptoms Yes (comment)     Comments SOB, legs cramping     Resting HR 101 bpm     Resting BP 122/64     Max Ex. HR 114 bpm     Max Ex. BP 132/64     2 Minute Post BP 128/70       Interval HR   Baseline HR (retired) 101     1 Minute HR 104     2 Minute HR 103     3 Minute HR 108     4 Minute HR 109     5 Minute HR 112     6 Minute HR 114     2 Minute Post HR 109     Interval Heart Rate? Yes       Interval Oxygen   Interval Oxygen? Yes     Baseline Oxygen Saturation % 95 %     Resting Liters of Oxygen 3 L     1 Minute Oxygen Saturation % 94 %     1 Minute Liters of Oxygen 3 L     2 Minute Oxygen Saturation % 94 %     2 Minute Liters of Oxygen 3 L     3 Minute Oxygen Saturation % 94 %     3 Minute Liters of Oxygen 3 L     4 Minute Oxygen Saturation % 96 %     4 Minute Liters of Oxygen 3 L     5 Minute Oxygen Saturation % 95 %     5 Minute Liters of Oxygen 3 L     6 Minute Oxygen Saturation % 95 %     6 Minute Liters of Oxygen 3 L     2 Minute Post Oxygen Saturation % 96 %     2 Minute Post Liters of Oxygen 3 L       Oxygen Initial Assessment:     Oxygen Initial Assessment - 01/05/17 1616      Home Oxygen   Home Oxygen Device Home Concentrator;E-Tanks   Sleep Oxygen Prescription Continuous   Liters per minute 3   Home Exercise Oxygen Prescription Continuous   Liters per minute 3   Home at Rest  Exercise Oxygen Prescription Continuous   Liters per minute 3   Compliance with Home Oxygen Use Yes     Initial 6 min Walk   Oxygen Used Continuous;E-Tanks   Liters per minute 3   Resting Oxygen Saturation  95 %   Exercise Oxygen Saturation  during 6 min walk 94 %     Program Oxygen Prescription   Program Oxygen Prescription Continuous;E-Tanks   Liters per minute 3     Intervention   Short Term Goals To learn and understand importance of monitoring SPO2 with pulse oximeter and demonstrate accurate use of the pulse oximeter.;To learn and exhibit compliance with exercise, home and travel O2 prescription;To Learn and understand importance of maintaining oxygen saturations>88%   Long  Term Goals Exhibits compliance with exercise,  home and travel O2 prescription;Maintenance of O2 saturations>88%;Verbalizes importance of monitoring SPO2 with pulse oximeter and return demonstration      Oxygen Re-Evaluation:     Oxygen Re-Evaluation    Row Name 01/14/17 1524 02/11/17 1519 03/18/17 1359         Program Oxygen Prescription   Program Oxygen Prescription  - Continuous;E-Tanks E-Tanks;Continuous     Liters per minute  - 3 3       Home Oxygen   Home Oxygen Device  - E-Tanks;Home Concentrator E-Tanks;Home Concentrator     Sleep Oxygen Prescription  - Continuous Continuous     Liters per minute  - 3 3     Home Exercise Oxygen Prescription  - Continuous Continuous     Liters per minute  - 4 4     Home at Rest Exercise Oxygen Prescription  - Continuous Continuous     Liters per minute  - 3 3     Compliance with Home Oxygen Use  - Yes  -       Goals/Expected Outcomes   Short Term Goals To learn and demonstrate proper purse lipped breathing techniques or other breathing techniques. To Learn and understand importance of maintaining oxygen saturations>88%;To learn and demonstrate proper purse lipped breathing techniques or other breathing techniques.;To learn and understand importance of  monitoring SPO2 with pulse oximeter and demonstrate accurate use of the pulse oximeter.;To learn and demonstrate proper use of respiratory medications;To learn and exhibit compliance with exercise, home and travel O2 prescription To learn and exhibit compliance with exercise, home and travel O2 prescription;To learn and understand importance of monitoring SPO2 with pulse oximeter and demonstrate accurate use of the pulse oximeter.;To Learn and understand importance of maintaining oxygen saturations>88%;To learn and demonstrate proper purse lipped breathing techniques or other breathing techniques.;To learn and demonstrate proper use of respiratory medications     Long  Term Goals  - Exhibits compliance with exercise, home and travel O2 prescription;Maintenance of O2 saturations>88%;Compliance with respiratory medication;Exhibits proper breathing techniques, such as purse lipped breathing or other method taught during program session;Demonstrates proper use of MDI's;Verbalizes importance of monitoring SPO2 with pulse oximeter and return demonstration Exhibits compliance with exercise, home and travel O2 prescription;Maintenance of O2 saturations>88%;Compliance with respiratory medication;Demonstrates proper use of MDI's;Exhibits proper breathing techniques, such as purse lipped breathing or other method taught during program session;Verbalizes importance of monitoring SPO2 with pulse oximeter and return demonstration     Comments Stanley Rios was taught how purse lip breathing is to help with his shortness of breath while exercising.  Stanley Rios has been doing well with his oxygen therapy.  He is compliant at home and while in program. He has had good O2 saturations.  He is using PLB fairly independent and has found it helpful at home too.  He needs more work on diaphragmatic breathing for relaxation and breath control.  He carries his pulse oximeter with him everywhere and checks his saturations when he is feeling  low.  He has not had any problems with his medications and is doing a better job of keeping his spacer clean. Stanley Rios has gone up since starting the program. His heart rate has not dropped like it has in the past and he is getting proficient with his PLB. He is checks his oxygen saturations at home and in class and knows he needs to be 88% or above. He is using his medications properly and verbalizes understanding of how to use them.  Goals/Expected Outcomes Short Term: Mr. Gaglio needs to be more proficient in  purse lipped breathing techniques. Long Term: Patient to be independent with purse lip breathing techniques. Short: Continue to work on breathing techniques.  Long: Continue to be independent with his oxygen therapy. Short: continue to work on PLB and monitor spo2 at home. Long: Be independent with PLB and use his respiratory medications properly.        Oxygen Discharge (Final Oxygen Re-Evaluation):     Oxygen Re-Evaluation - 03/18/17 1359      Program Oxygen Prescription   Program Oxygen Prescription E-Tanks;Continuous   Liters per minute 3     Home Oxygen   Home Oxygen Device E-Tanks;Home Concentrator   Sleep Oxygen Prescription Continuous   Liters per minute 3   Home Exercise Oxygen Prescription Continuous   Liters per minute 4   Home at Rest Exercise Oxygen Prescription Continuous   Liters per minute 3     Goals/Expected Outcomes   Short Term Goals To learn and exhibit compliance with exercise, home and travel O2 prescription;To learn and understand importance of monitoring SPO2 with pulse oximeter and demonstrate accurate use of the pulse oximeter.;To Learn and understand importance of maintaining oxygen saturations>88%;To learn and demonstrate proper purse lipped breathing techniques or other breathing techniques.;To learn and demonstrate proper use of respiratory medications   Long  Term Goals Exhibits compliance with exercise, home and travel O2  prescription;Maintenance of O2 saturations>88%;Compliance with respiratory medication;Demonstrates proper use of MDI's;Exhibits proper breathing techniques, such as purse lipped breathing or other method taught during program session;Verbalizes importance of monitoring SPO2 with pulse oximeter and return demonstration   Comments Stanley Rios has gone up since starting the program. His heart rate has not dropped like it has in the past and he is getting proficient with his PLB. He is checks his oxygen saturations at home and in class and knows he needs to be 88% or above. He is using his medications properly and verbalizes understanding of how to use them.   Goals/Expected Outcomes Short: continue to work on PLB and monitor spo2 at home. Long: Be independent with PLB and use his respiratory medications properly.      Initial Exercise Prescription:     Initial Exercise Prescription - 01/05/17 1600      Date of Initial Exercise RX and Referring Provider   Date 01/05/17   Referring Provider [name] MD     Oxygen   Oxygen Continuous   Liters 3     Treadmill   MPH 1.7   Grade 0   Minutes 15   METs 2.3     NuStep   Level 1   SPM 80   Minutes 15   METs 2     REL-XR   Level 1   Speed 50   Minutes 15   METs 2     Prescription Details   Frequency (times per week) 3   Duration Progress to 45 minutes of aerobic exercise without signs/symptoms of physical distress     Intensity   THRR 40-80% of Max Heartrate 124-147   Ratings of Perceived Exertion 11-13   Perceived Dyspnea 0-4     Progression   Progression Continue to progress workloads to maintain intensity without signs/symptoms of physical distress.     Resistance Training   Training Prescription Yes   Weight 3 lbs   Reps 10-15      Perform Capillary Blood Glucose checks as needed.  Exercise Prescription  Changes:     Exercise Prescription Changes    Row Name 01/14/17 1400 01/27/17 1400 02/10/17  1600 02/11/17 1500 02/24/17 1500     Response to Exercise   Blood Pressure (Admit) 142/74 130/66 116/68  - 114/60   Blood Pressure (Exercise) 142/80 128/70 124/70  - 164/78   Blood Pressure (Exit) 120/80 118/68 112/60  - 120/76   Heart Rate (Admit) 104 bpm 98 bpm 108 bpm  - 102 bpm   Heart Rate (Exercise) 118 bpm 105 bpm 108 bpm  - 119 bpm   Heart Rate (Exit) 105 bpm 102 bpm 112 bpm  - 108 bpm   Oxygen Saturation (Admit) 93 % 94 % 91 %  - 97 %   Oxygen Saturation (Exercise) 88 % 92 % 94 %  - 92 %   Oxygen Saturation (Exit) 97 % 97 % 96 %  - 98 %   Rating of Perceived Exertion (Exercise) _0 - 15   Perceived Dyspnea (Exercise) _1 - 2   Symptoms SOB, fatigued SOB, fatigued SOB, fatigued  - fatigue   Comments first full day of exercise  -  -  -  -   Duration Progress to 45 minutes of aerobic exercise without signs/symptoms of physical distress Progress to 45 minutes of aerobic exercise without signs/symptoms of physical distress Continue with 45 min of aerobic exercise without signs/symptoms of physical distress.  - Continue with 45 min of aerobic exercise without signs/symptoms of physical distress.   Intensity THRR unchanged THRR unchanged THRR unchanged  - THRR unchanged     Progression   Progression Continue to progress workloads to maintain intensity without signs/symptoms of physical distress. Continue to progress workloads to maintain intensity without signs/symptoms of physical distress. Continue to progress workloads to maintain intensity without signs/symptoms of physical distress.  - Continue to progress workloads to maintain intensity without signs/symptoms of physical distress.   Average METs 1.49 1.69 1.76  - 2.26     Resistance Training   Training Prescription Yes Yes Yes  - Yes   Weight 3 lbs 3 lbs 3 lbs  - 4 lbs   Reps 10-15 10-15 10-15  - 10-15     Interval Training   Interval Training No No No  - No     Oxygen   Oxygen Continuous Continuous Continuous  -  Continuous   Liters _2 - 3     Treadmill   MPH _3 - 1.4   Grade 0 0 0  - 0   Minutes 4  2 min, 1:40 7  3 min 2 min 2 min 15  - 15   METs 1.77 1.77 1.77  - 2.07     NuStep   Level _4 - 5   SPM 56 74 59  - 68   Minutes _5 - 15   METs 1.5 2 1.8  - 2.4     REL-XR   Level _6 - 3   Speed 30 39  -  - 45   Minutes _7 - 15   METs 1.2 1.3 1.7  - 2.3     Home Exercise Plan   Plans to continue exercise at  -  -  - [employer] (comment)  [employer] and walking [employer] (comment)  [employer] and walking   Frequency  -  -  - Add 1  additional day to program exercise sessions. Add 1 additional day to program exercise sessions.   Initial Home Exercises Provided  -  -  - 02/11/17 02/11/17   Row Name 03/10/17 1500             Response to Exercise   Blood Pressure (Admit) 110/64       Blood Pressure (Exercise) 145/78       Blood Pressure (Exit) 118/84       Heart Rate (Admit) 110 bpm       Heart Rate (Exercise) 119 bpm       Heart Rate (Exit) 104 bpm       Oxygen Saturation (Admit) 98 %       Oxygen Saturation (Exercise) 94 %       Oxygen Saturation (Exit) 97 %       Rating of Perceived Exertion (Exercise) 15       Perceived Dyspnea (Exercise) 2       Symptoms fatigue and SOB on treadmill       Duration Continue with 45 min of aerobic exercise without signs/symptoms of physical distress.       Intensity THRR unchanged         Progression   Progression Continue to progress workloads to maintain intensity without signs/symptoms of physical distress.       Average METs 2.67         Resistance Training   Training Prescription Yes       Weight 4 lbs       Reps 10-15         Interval Training   Interval Training No         Oxygen   Oxygen Continuous       Liters 3         Treadmill   MPH 1.3       Grade 0       Minutes 15       METs 2         NuStep   Level 5       SPM 79       Minutes 15       METs 2.3          REL-XR   Level 5       Speed 45       Minutes 15       METs 3.7         Home Exercise Plan   Plans to continue exercise at [employer] (comment)  [employer] and walking       Frequency Add 1 additional day to program exercise sessions.       Initial Home Exercises Provided 02/11/17          Exercise Comments:     Exercise Comments    Row Name 01/14/17 1456 01/14/17 1530         Exercise Comments First full day of exercise!  Patient was oriented to gym and equipment including functions, settings, policies, and procedures.  Patient's individual exercise prescription and treatment plan were reviewed.  All starting workloads were established based on the results of the 6 minute walk test done at initial orientation visit.  The plan for exercise progression was also introduced and progression will be customized based on patient's performance and goals. Mr. Xiong was exercising on the treadmill and was short of breath after 2 minutes. He did intervals of about two minutes, pulse and spO2 was  checked when regaining his breath. S HR stayed was reading in the 60's but then increased into the 100's. Two sat probes were used to determine if the reading was correct. He was placed on telemetry for any drops in heart rate while exercising but now significant changes were noted.         Exercise Goals and Review:     Exercise Goals    Row Name 01/05/17 1607             Exercise Goals   Increase Physical Activity Yes       Intervention Provide advice, education, support and counseling about physical activity/exercise needs.;Develop an individualized exercise prescription for aerobic and resistive training based on initial evaluation findings, risk stratification, comorbidities and participant's personal goals.       Expected Outcomes Achievement of increased cardiorespiratory fitness and enhanced flexibility, muscular endurance and strength shown through measurements of  functional capacity and personal statement of participant.       Increase Strength and Stamina Yes       Intervention Provide advice, education, support and counseling about physical activity/exercise needs.;Develop an individualized exercise prescription for aerobic and resistive training based on initial evaluation findings, risk stratification, comorbidities and participant's personal goals.       Expected Outcomes Achievement of increased cardiorespiratory fitness and enhanced flexibility, muscular endurance and strength shown through measurements of functional capacity and personal statement of participant.          Exercise Goals Re-Evaluation :     Exercise Goals Re-Evaluation    Row Name 01/27/17 1426 02/10/17 1615 02/11/17 1503 02/24/17 1543 03/10/17 1504     Exercise Goal Re-Evaluation   Exercise Goals Review Increase Physical Activity;Increase Strenth and Stamina Increase Physical Activity;Increase Strenth and Stamina Increase Physical Activity;Increase Strenth and Stamina Increase Physical Activity;Increase Strenth and Stamina Increase Physical Activity;Increase Strenth and Stamina   Comments Stanley Rios is off to a good start in rehab.  He missed a week due to doctor's appointments.  He is working on increasing his time on the treadmill.  Currently he is up to 7 min total.  We will continue to monitor his progress. Stanley Rios is doing well in rehab.  He is getting stronger and able to do more.  He is now up ot 15 minutes on the treadmill!! He has also increased the NuStep to level 4.  We will continue to monitor his progression.   Stanley Rios feels like he is improving.  He is doing his 15 min on the treadmill.  He is feeling stronger and has more stamina already.  Reviewed home exercise with pt today.  Pt plans to walk and go to [employer] for exercise.  Reviewed THR, pulse, RPE, sign and symptoms, and when to call 911 or MD.  Also discussed weather considerations and indoor options.  We also  discussed how to slowly progress his home exercise.  Pt voiced understanding. Stanley Rios has continued to do well in rehab.  He is now up to 1.4 mph on the treadmill.  He is not currently doing his home exercise until cleared by cardiology.  However, we did discuss that it also does not mean that he can just go home and sit all day.  He needs to stay active. We will continue to monitor his progression.  Stanley Rios continues to work hard in rehab.  He is already at the half way point.  He is concerned about his cancer diagnosis and how it will affect him going forward.  He has moved up to level 5 on the XR.  We will continue to monitor his progression.   Expected Outcomes Short: Continue to increase time on treadmill.  Long; Continue to attend class to work on strength and stamina. Short: Try to increase workload on XR and attend more regularly by scheduling appointments around rehab.  Long: Increase strength and stamina. Short: Add in at least extra day of exercise at home and to attend class regularly.  Long: Exercise more frequently. Short: Increase speed/spm on both XR and NuStep.  Long: Move more at home. Short: Work on bringing speed back up on treadmill.  Long: Continue to increase activity level at home.       Discharge Exercise Prescription (Final Exercise Prescription Changes):     Exercise Prescription Changes - 03/10/17 1500      Response to Exercise   Blood Pressure (Admit) 110/64   Blood Pressure (Exercise) 145/78   Blood Pressure (Exit) 118/84   Heart Rate (Admit) 110 bpm   Heart Rate (Exercise) 119 bpm   Heart Rate (Exit) 104 bpm   Oxygen Saturation (Admit) 98 %   Oxygen Saturation (Exercise) 94 %   Oxygen Saturation (Exit) 97 %   Rating of Perceived Exertion (Exercise) 15   Perceived Dyspnea (Exercise) 2   Symptoms fatigue and SOB on treadmill   Duration Continue with 45 min of aerobic exercise without signs/symptoms of physical distress.   Intensity THRR unchanged     Progression    Progression Continue to progress workloads to maintain intensity without signs/symptoms of physical distress.   Average METs 2.67     Resistance Training   Training Prescription Yes   Weight 4 lbs   Reps 10-15     Interval Training   Interval Training No     Oxygen   Oxygen Continuous   Liters 3     Treadmill   MPH 1.3   Grade 0   Minutes 15   METs 2     NuStep   Level 5   SPM 79   Minutes 15   METs 2.3     REL-XR   Level 5   Speed 45   Minutes 15   METs 3.7     Home Exercise Plan   Plans to continue exercise at [employer] (comment)  [employer] and walking   Frequency Add 1 additional day to program exercise sessions.   Initial Home Exercises Provided 02/11/17      Nutrition:  Target Goals: Understanding of nutrition guidelines, daily intake of sodium <1556m, cholesterol <2015m calories 30% from fat and 7% or less from saturated fats, daily to have 5 or more servings of fruits and vegetables.  Biometrics:     Pre Biometrics - 01/05/17 1607      Pre Biometrics   Height 5' 10.9" (1.801 m)   Weight 191 lb 9.6 oz (86.9 kg)   Waist Circumference 43 inches   Hip Circumference 37 inches   Waist to Hip Ratio 1.16 %   BMI (Calculated) 26.9       Nutrition Therapy Plan and Nutrition Goals:   Nutrition Discharge: Rate Your Plate Scores:   Nutrition Goals Re-Evaluation:     Nutrition Goals Re-Evaluation    [place] 02/11/17 1526             Goals   Current Weight 199 lb (90.3 kg)       Comment Scheduled appt for 7/30 at 10am with dietician.  Would  like to discuss diet for weight loss.           Nutrition Goals Discharge (Final Nutrition Goals Re-Evaluation):     Nutrition Goals Re-Evaluation - 02/11/17 1526      Goals   Current Weight 199 lb (90.3 kg)   Comment Scheduled appt for 7/30 at 10am with dietician.  Would like to discuss diet for weight loss.       Psychosocial: Target Goals: Acknowledge presence or absence  of significant depression and/or stress, maximize coping skills, provide positive support system. Participant is able to verbalize types and ability to use techniques and skills needed for reducing stress and depression.   Initial Review & Psychosocial Screening:     Initial Psych Review & Screening - 01/05/17 [place]? Yes   Comments Mr Caleen Rios has good support from his wife and one son. He states he has no depression and is looking forward to increasing his stamina and energy level through his participation in [place].     Barriers   Psychosocial barriers to participate in program There are no identifiable barriers or psychosocial needs.;The patient should benefit from training in stress management and relaxation.     Screening Interventions   Interventions Encouraged to exercise      Quality of Life Scores:     Quality of Life - 01/05/17 1626      Quality of Life Scores   Health/Function Pre 19.81 %   Socioeconomic Pre 19 %   Psych/Spiritual Pre 20.14 %   Family Pre 20.8 %   GLOBAL Pre 19.86 %      PHQ-9: Recent Review Flowsheet Data    Depression screen [facility] 2/9 03/04/2017 01/22/2017 01/13/2017 01/05/2017 12/31/2016   Decreased Interest 2 0 1 1 0   Down, Depressed, Hopeless 1 0 3 0 0   PHQ - 2 Score 3 0 4 1 0   Altered sleeping 0 - 1 1 -   Tired, decreased energy 2 - 1 1 -   Change in appetite 2 - 0 0 -   Feeling bad or failure about yourself  2 - 1 1 -   Trouble concentrating 0 - 0 0 -   Moving slowly or fidgety/restless 1 - 0 0 -   Suicidal thoughts 0 - 0 0 -   PHQ-9 Score 10 - 7 4 -   Difficult doing work/chores - - Somewhat difficult - -     Interpretation of Total Score  Total Score Depression Severity:  1-4 = Minimal depression, 5-9 = Mild depression, 10-14 = Moderate depression, 15-19 = Moderately severe depression, 20-27 = Severe depression   Psychosocial Evaluation and Intervention:     Psychosocial Evaluation -  01/26/17 1220      Psychosocial Evaluation & Interventions   Interventions Stress management education;Encouraged to exercise with the program and follow exercise prescription;Relaxation education   Comments Counselor met with Stanley Rios Stanley Rios) today for initial psychosocial evaluation.  He is a 61 year old who has been diagnosed with both COPD and Emphysema.  He also is concerned about a PET Scan for cancer screening in his lungs this Thursday.  He has a strong support system with a spouse of 60 years; a son locally and mother a step-sister who live next door.  Stanley Rios reports he has his days and nights mixed up when it comes to sleeping.  He is on medication to help with this.  He  has a good appetite.  Stanley Rios reports he has a history of depression and is on medication that treats most of these symptoms, but is considering asking the Dr. to increase this.  He states his mood is typically positive.  Stanley Rios has multiple stressors in his life currently with the PET Scan this week; his own health and finances.  Stanley Rios has goals to improve his breathing; increase his mobility and ability to walk and possibly lose some weight.  Staff will be following with Stanley Rios throughout the course of this program.     Expected Outcomes Stanley Rios will benefit from consistent exercise to achieve his stated goals.  He also will benefit from meeting with the dietician to address his weight loss goal.  Stanley Rios has multiple stressors in his life so participation in the psychoeducational components will help increase his coping strategies.  Counselor will be following with Stanley Rios.   Continue Psychosocial Services  Follow up required by counselor      Psychosocial Re-Evaluation:     Psychosocial Re-Evaluation    Crisp Name 03/09/17 1233             Psychosocial Re-Evaluation   Current issues with Current Stress Concerns;Current Sleep Concerns;Current Psychotropic Meds;History of Depression;Current Depression       Comments Counselor follow up  with Stanley Rios today reporting breathing better and using a little less oxygen when at home since coming in to this class.  He is also noticing his legs are stronger.  However, Stanley Rios is seeing an oncologist later this week due to a spot found on his lungs.  He was told this is 98% positive for cancer and he is not strong enough for a biopsy or surgery; so Stanley Rios is concerned to say the least.  He is also struggling with chronic back pain which is impacting his quality of life.  Counselor encouraged Stanley Rios to speak with his Dr. about possibly increasing his Cymbalta since his depression inventory indicated his symptoms for depression have increased with all that is going on in his life currently.  He agreed to do so. Counselor will follow with Stanley Rios on this.       Expected Outcomes Stanley Rios will contact his Drs office today about his mood medications to see if an increase to help with his current depressive symptoms would be possible.       Continue Psychosocial Services  Follow up required by counselor          Psychosocial Discharge (Final Psychosocial Re-Evaluation):     Psychosocial Re-Evaluation - 03/09/17 1233      Psychosocial Re-Evaluation   Current issues with Current Stress Concerns;Current Sleep Concerns;Current Psychotropic Meds;History of Depression;Current Depression   Comments Counselor follow up with Stanley Rios today reporting breathing better and using a little less oxygen when at home since coming in to this class.  He is also noticing his legs are stronger.  However, Stanley Rios is seeing an oncologist later this week due to a spot found on his lungs.  He was told this is 98% positive for cancer and he is not strong enough for a biopsy or surgery; so Stanley Rios is concerned to say the least.  He is also struggling with chronic back pain which is impacting his quality of life.  Counselor encouraged Stanley Rios to speak with his Dr. about possibly increasing his Cymbalta since his depression inventory indicated his symptoms for  depression have increased with all that is going on in his life currently.  He agreed to do so.  Counselor will follow with Stanley Rios on this.   Expected Outcomes Stanley Rios will contact his Drs office today about his mood medications to see if an increase to help with his current depressive symptoms would be possible.   Continue Psychosocial Services  Follow up required by counselor      Education: Education Goals: Education classes will be provided on a weekly basis, covering required topics. Participant will state understanding/return demonstration of topics presented.  Learning Barriers/Preferences:     Learning Barriers/Preferences - 01/05/17 1608      Learning Barriers/Preferences   Learning Barriers None   Learning Preferences None      Education Topics: Initial Evaluation Education: - Verbal, written and demonstration of respiratory meds, RPE/PD scales, oximetry and breathing techniques. Instruction on use of nebulizers and MDIs: cleaning and proper use, rinsing mouth with steroid doses and importance of monitoring MDI activations.   Pulmonary Rehab from 03/18/2017 in [facility] Cardiac and Pulmonary Rehab  Date  01/05/17  Educator  LB  Instruction Review Code (retired)  2- meets goals/outcomes      General Nutrition Guidelines/Fats and Fiber: -Group instruction provided by verbal, written material, models and posters to present the general guidelines for heart healthy nutrition. Gives an explanation and review of dietary fats and fiber.   Pulmonary Rehab from 03/18/2017 in [facility] Cardiac and Pulmonary Rehab  Date  03/16/17  Educator  CR  Instruction Review Code (retired)  2- meets goals/outcomes      Controlling Sodium/Reading Food Labels: -Group verbal and written material supporting the discussion of sodium use in heart healthy nutrition. Review and explanation with models, verbal and written materials for utilization of the food label.   Pulmonary Rehab from 03/18/2017 in [facility] Cardiac  and Pulmonary Rehab  Date  01/26/17  Educator  CR  Instruction Review Code (retired)  2- meets goals/outcomes      Exercise Physiology & Risk Factors: - Group verbal and written instruction with models to review the exercise physiology of the cardiovascular system and associated critical values. Details cardiovascular disease risk factors and the goals associated with each risk factor.   Pulmonary Rehab from 03/18/2017 in [facility] Cardiac and Pulmonary Rehab  Date  02/04/17  Educator  AS  Instruction Review Code (retired)  2- meets goals/outcomes      Aerobic Exercise & Resistance Training: - Gives group verbal and written discussion on the health impact of inactivity. On the components of aerobic and resistive training programs and the benefits of this training and how to safely progress through these programs.   Pulmonary Rehab from 03/18/2017 in [facility] Cardiac and Pulmonary Rehab  Date  02/20/17  Educator  [facility]  Instruction Review Code (retired)  2- Statistician, Balance, General Exercise Guidelines: - Provides group verbal and written instruction on the benefits of flexibility and balance training programs. Provides general exercise guidelines with specific guidelines to those with heart or lung disease. Demonstration and skill practice provided.   Stress Management: - Provides group verbal and written instruction about the health risks of elevated stress, cause of high stress, and healthy ways to reduce stress.   Depression: - Provides group verbal and written instruction on the correlation between heart/lung disease and depressed mood, treatment options, and the stigmas associated with seeking treatment.   Pulmonary Rehab from 03/18/2017 in [facility] Cardiac and Pulmonary Rehab  Date  03/18/17  Educator  [facility]  Instruction Review Code (retired)  2- meets goals/outcomes  Exercise & Equipment Safety: - Individual verbal instruction and demonstration of  equipment use and safety with use of the equipment.   Pulmonary Rehab from 03/18/2017 in [facility] Cardiac and Pulmonary Rehab  Date  01/14/17  Educator  [facility]  Instruction Review Code (retired)  2- meets goals/outcomes      Infection Prevention: - Provides verbal and written material to individual with discussion of infection control including proper hand washing and proper equipment cleaning during exercise session.   Pulmonary Rehab from 03/18/2017 in [facility] Cardiac and Pulmonary Rehab  Date  01/14/17  Educator  [facility]  Instruction Review Code (retired)  2- meets [employer] Prevention: - Provides verbal and written material to individual with discussion of falls prevention and safety.   Pulmonary Rehab from 03/18/2017 in [facility] Cardiac and Pulmonary Rehab  Date  01/05/17  Educator  LB  Instruction Review Code (retired)  2- meets goals/outcomes      Diabetes: - Individual verbal and written instruction to review signs/symptoms of diabetes, desired ranges of glucose level fasting, after meals and with exercise. Advice that pre and post exercise glucose checks will be done for 3 sessions at entry of program.   Chronic Lung Diseases: - Group verbal and written instruction to review new updates, new respiratory medications, new advancements in procedures and treatments. Provide informative websites and "800" numbers of self-education.   Pulmonary Rehab from 03/18/2017 in [facility] Cardiac and Pulmonary Rehab  Date  02/11/17  Educator  [facility]  Instruction Review Code (retired)  2- meets goals/outcomes      Lung Procedures: - Group verbal and written instruction to describe testing methods done to diagnose lung disease. Review the outcome of test results. Describe the treatment choices: Pulmonary Function Tests, ABGs and oximetry.   Energy Conservation: - Provide group verbal and written instruction for methods to conserve energy, plan and organize activities. Instruct on pacing techniques,  use of adaptive equipment and posture/positioning to relieve shortness of breath.   Triggers: - Group verbal and written instruction to review types of environmental controls: home humidity, furnaces, filters, dust mite/pet prevention, HEPA vacuums. To discuss weather changes, air quality and the benefits of nasal washing.   Exacerbations: - Group verbal and written instruction to provide: warning signs, infection symptoms, calling MD promptly, preventive modes, and value of vaccinations. Review: effective airway clearance, coughing and/or vibration techniques. Create an Sports administrator.   Pulmonary Rehab from 03/18/2017 in [facility] Cardiac and Pulmonary Rehab  Date  02/25/17  Educator  [facility]  Instruction Review Code (retired)  2- meets goals/outcomes      Oxygen: - Individual and group verbal and written instruction on oxygen therapy. Includes supplement oxygen, available portable oxygen systems, continuous and intermittent flow rates, oxygen safety, concentrators, and Medicare reimbursement for oxygen.   Pulmonary Rehab from 03/18/2017 in [facility] Cardiac and Pulmonary Rehab  Date  01/05/17  Educator  LB  Instruction Review Code (retired)  2- meets goals/outcomes      Respiratory Medications: - Group verbal and written instruction to review medications for lung disease. Drug class, frequency, complications, importance of spacers, rinsing mouth after steroid MDI's, and proper cleaning methods for nebulizers.   Pulmonary Rehab from 03/18/2017 in [facility] Cardiac and Pulmonary Rehab  Date  01/05/17  Educator  LB  Instruction Review Code (retired)  2- meets goals/outcomes      AED/CPR: - Group verbal and written instruction with the use of models to demonstrate the basic use of the AED  with the basic ABC's of resuscitation.   Breathing Retraining: - Provides individuals verbal and written instruction on purpose, frequency, and proper technique of diaphragmatic breathing and pursed-lipped breathing.  Applies individual practice skills.   Pulmonary Rehab from 03/18/2017 in [facility] Cardiac and Pulmonary Rehab  Date  01/05/17  Educator  LB  Instruction Review Code (retired)  2- Lawyer and Physiology of the Lungs: - Group verbal and written instruction with the use of models to provide basic lung anatomy and physiology related to function, structure and complications of lung disease.   Anatomy & Physiology of the Heart: - Group verbal and written instruction and models provide basic cardiac anatomy and physiology, with the coronary electrical and arterial systems. Review of: AMI, Angina, Valve disease, Heart Failure, Cardiac Arrhythmia, Pacemakers, and the ICD.   Pulmonary Rehab from 03/18/2017 in [facility] Cardiac and Pulmonary Rehab  Date  03/06/17  Educator  [facility]  Instruction Review Code (retired)  2- meets goals/outcomes      Heart Failure: - Group verbal and written instruction on the basics of heart failure: signs/symptoms, treatments, explanation of ejection fraction, enlarged heart and cardiomyopathy.   Pulmonary Rehab from 03/18/2017 in [facility] Cardiac and Pulmonary Rehab  Date  03/06/17  Educator  [facility]  Instruction Review Code (retired)  2- meets goals/outcomes      Sleep Apnea: - Individual verbal and written instruction to review Obstructive Sleep Apnea. Review of risk factors, methods for diagnosing and types of masks and machines for OSA.   Anxiety: - Provides group, verbal and written instruction on the correlation between heart/lung disease and anxiety, treatment options, and management of anxiety.   Relaxation: - Provides group, verbal and written instruction about the benefits of relaxation for patients with heart/lung disease. Also provides patients with examples of relaxation techniques.   Cardiac Medications: - Group verbal and written instruction to review commonly prescribed medications for heart disease. Reviews the medication, class of the  drug, and side effects.   Know Your Numbers: -Group verbal and written instruction about important numbers in your health.  Review of Cholesterol, Blood Pressure, Diabetes, and BMI and the role they play in your overall health.   Other: -Provides group and verbal instruction on various topics (see comments)    Knowledge Questionnaire Score:     Knowledge Questionnaire Score - 01/05/17 1609      Knowledge Questionnaire Score   Pre Score 8/10       Core Components/Risk Factors/Patient Goals at Admission:     Personal Goals and Risk Factors at Admission - 01/05/17 1618      Core Components/Risk Factors/Patient Goals on Admission    Weight Management Yes;Weight Loss   Intervention Weight Management: Develop a combined nutrition and exercise program designed to reach desired caloric intake, while maintaining appropriate intake of nutrient and fiber, sodium and fats, and appropriate energy expenditure required for the weight goal.;Weight Management: Provide education and appropriate resources to help participant work on and attain dietary goals.   Admit Weight 191 lb 9.6 oz (86.9 kg)   Goal Weight: Short Term 186 lb (84.4 kg)   Goal Weight: Long Term 160 lb (72.6 kg)   Expected Outcomes Short Term: Continue to assess and modify interventions until short term weight is achieved;Weight Maintenance: Understanding of the daily nutrition guidelines, which includes 25-35% calories from fat, 7% or less cal from saturated fats, less than 271m cholesterol, less than 1.5gm of sodium, & 5 or more servings  of fruits and vegetables daily;Weight Loss: Understanding of general recommendations for a balanced deficit meal plan, which promotes 1-2 lb weight loss per week and includes a negative energy balance of [phone] kcal/d;Understanding recommendations for meals to include 15-35% energy as protein, 25-35% energy from fat, 35-60% energy from carbohydrates, less than 217m of dietary cholesterol, 20-35  gm of total fiber daily;Understanding of distribution of calorie intake throughout the day with the consumption of 4-5 meals/snacks   Tobacco Cessation Yes   Number of packs per day Quit 11/17; still using nicotine patches   Improve shortness of breath with ADL's Yes   Intervention Provide education, individualized exercise plan and daily activity instruction to help decrease symptoms of SOB with activities of daily living.   Expected Outcomes Short Term: Achieves a reduction of symptoms when performing activities of daily living.   Develop more efficient breathing techniques such as purse lipped breathing and diaphragmatic breathing; and practicing self-pacing with activity Yes   Intervention Provide education, demonstration and support about specific breathing techniuqes utilized for more efficient breathing. Include techniques such as pursed lipped breathing, diaphragmatic breathing and self-pacing activity.   Expected Outcomes Short Term: Participant will be able to demonstrate and use breathing techniques as needed throughout daily activities.   Increase knowledge of respiratory medications and ability to use respiratory devices properly  Yes  Advair, Spiriva, Atrovent, Albuterol SVN and MDI; spacer given   Intervention Provide education and demonstration as needed of appropriate use of medications, inhalers, and oxygen therapy.   Expected Outcomes Short Term: Achieves understanding of medications use. Understands that oxygen is a medication prescribed by physician. Demonstrates appropriate use of inhaler and oxygen therapy.   Hypertension Yes   Intervention Provide education on lifestyle modifcations including regular physical activity/exercise, weight management, moderate sodium restriction and increased consumption of fresh fruit, vegetables, and low fat dairy, alcohol moderation, and smoking cessation.;Monitor prescription use compliance.   Expected Outcomes Short Term: Continued assessment  and intervention until BP is < 140/955mHG in hypertensive participants. < 130/8054mG in hypertensive participants with diabetes, heart failure or chronic kidney disease.;Long Term: Maintenance of blood pressure at goal levels.      Core Components/Risk Factors/Patient Goals Review:      Goals and Risk Factor Review    Row Name 02/11/17 1508 03/04/17 1221           Core Components/Risk Factors/Patient Goals Review   Personal Goals Review Hypertension;Tobacco Cessation;Weight Management/Obesity;Improve shortness of breath with ADL's Weight Management/Obesity;Increase knowledge of respiratory medications and ability to use respiratory devices properly.;Develop more efficient breathing techniques such as purse lipped breathing and diaphragmatic breathing and practicing self-pacing with activity.;Improve shortness of breath with ADL's;Stress      Review Stanley Channels been doing well in rehab.  He is feeling stronger and already has more stamina as well.   He is able to do more at home with less SOB.  However, his weight is starting to trend up.  He has quit smoking completely and stopped using the patches a week ago!!  He is trying to eat better, but continues to see weight gain.  He did mention that his stomach is bothersome with both swelling and acid.  He also has lots of issues with consitpation with his hernias.  We discussed meeting with dietician and made him an appointment that his wife can also attend.  Stanley Rios checks his blood pressure regularly at home. He has not been recording his numbers but plans to start tracking to  take with him to appointment with cardiologist on 8/3.   Stanley Rios reported feeling better since beginning classes. He does express concerns about his wife continuing to smoke, but she has no plans to quit around him. Stanley Rios is continuing to practice pursed lip breathing and using his medications correctly. His main concern is his pain when he walks, and he is in line to see a doctor at the  pain clinic to help with this. His cardiologist told him he has an irregular heart beat and to rest if it starts to beat too slow or too fast.  His appointment with the nutritionist had to be cancelled, but he reported that he would let the staff know when he is ready to make another appointment as he still wishes to have help changing his eating habits.       Expected Outcomes Short: Attend nutrition appointment and work on weight.  Long: Continue to work on risk factors modifications. Short: he wants to meet with the nutrionist soon when it is better timing for him and his wife; Long: he wants to get in with the pain clinic to manage pain control         Core Components/Risk Factors/Patient Goals at Discharge (Final Review):      Goals and Risk Factor Review - 03/04/17 1221      Core Components/Risk Factors/Patient Goals Review   Personal Goals Review Weight Management/Obesity;Increase knowledge of respiratory medications and ability to use respiratory devices properly.;Develop more efficient breathing techniques such as purse lipped breathing and diaphragmatic breathing and practicing self-pacing with activity.;Improve shortness of breath with ADL's;Stress   Review Stanley Rios reported feeling better since beginning classes. He does express concerns about his wife continuing to smoke, but she has no plans to quit around him. Stanley Rios is continuing to practice pursed lip breathing and using his medications correctly. His main concern is his pain when he walks, and he is in line to see a doctor at the pain clinic to help with this. His cardiologist told him he has an irregular heart beat and to rest if it starts to beat too slow or too fast.  His appointment with the nutritionist had to be cancelled, but he reported that he would let the staff know when he is ready to make another appointment as he still wishes to have help changing his eating habits.    Expected Outcomes Short: he wants to meet with the  nutrionist soon when it is better timing for him and his wife; Long: he wants to get in with the pain clinic to manage pain control      ITP Comments:     ITP Comments    Row Name 01/26/17 0823 02/13/17 1140 02/23/17 0911 02/23/17 1545 03/18/17 1353   ITP Comments 30 day note review with Dr Emily Filbert, Medical Director of [name] Patient attended education "Know Your Numbers" 30 day review completed ITP sent to Dr. Ramonita Lab for Dr. Emily Filbert Director of [place]. Continue with ITP unless changes are made by physician.  Spoke with Stanley Rios and the effects of second hand smoke that his wife may be causing. He stated that she wants to quit but has not tried yet. Informed patient that his health is at risk with second hand smoke. He is going to ask her to at least not smoke in the car before class. Stanley Rios will be out September 5th,7th,10th and 12th due to radiation therapy for a Small mass in his lung. He is going  to try to attend if he is feeling up to it.   [place] Name 03/23/17 0845           ITP Comments 30 day review completed. ITP sent to Dr. Emily Filbert Director of [place]. Continue with ITP unless changes are made by physician.            Comments: 30 day review

## 2017-03-25 DIAGNOSIS — J449 Chronic obstructive pulmonary disease, unspecified: Secondary | ICD-10-CM | POA: Diagnosis not present

## 2017-03-25 NOTE — Progress Notes (Signed)
Daily Session Note  Patient Details  Name: Stanley Rios. MRN: [id number] Date of Birth: 1956-03-21 Referring Provider:     Pulmonary Rehab from 01/05/2017 in [facility] Cardiac and Pulmonary Rehab  Referring Provider  [name] MD      Encounter Date: 03/25/2017  Check In:     Session Check In - 03/25/17 1146      Check-In   Location [facility]-Cardiac & Pulmonary Rehab   Staff Present Alberteen Sam, MA, ACSM RCEP, Exercise Physiologist;Shantika Bermea Flavia Shipper   Supervising physician immediately available to respond to emergencies [name] immediately available ER MD   Physician(s) Dr. Burlene Arnt and [facility]   Medication changes reported     No   Fall or balance concerns reported    No   Warm-up and Cool-down Performed as group-led instruction   Resistance Training Performed Yes   VAD Patient? No     Pain Assessment   Currently in Pain? No/denies   Multiple Pain Sites No         History  Smoking Status  . Former Smoker  . Packs/day: 0.00  . Years: 40.00  . Types: Cigarettes  . Quit date: 05/28/2016  Smokeless Tobacco  . Former Systems developer    Comment: continues to use patches    Goals Met:  Proper associated with RPD/PD & O2 Sat Independence with exercise equipment Exercise tolerated well No report of cardiac concerns or symptoms Strength training completed today  Goals Unmet:  Not Applicable  Comments: Pt able to follow exercise prescription today without complaint.  Will continue to monitor for progression.   Dr. Emily Filbert is Medical Director for [place] and [name].

## 2017-03-26 DIAGNOSIS — R609 Edema, unspecified: Secondary | ICD-10-CM | POA: Diagnosis not present

## 2017-03-26 DIAGNOSIS — R911 Solitary pulmonary nodule: Secondary | ICD-10-CM | POA: Diagnosis not present

## 2017-03-26 DIAGNOSIS — F329 Major depressive disorder, single episode, unspecified: Secondary | ICD-10-CM | POA: Diagnosis not present

## 2017-03-26 DIAGNOSIS — C3411 Malignant neoplasm of upper lobe, right bronchus or lung: Secondary | ICD-10-CM | POA: Diagnosis not present

## 2017-03-26 DIAGNOSIS — I10 Essential (primary) hypertension: Secondary | ICD-10-CM | POA: Diagnosis not present

## 2017-04-01 ENCOUNTER — Encounter: Payer: PPO | Attending: Pulmonary Disease

## 2017-04-01 DIAGNOSIS — Z7951 Long term (current) use of inhaled steroids: Secondary | ICD-10-CM | POA: Diagnosis not present

## 2017-04-01 DIAGNOSIS — Z79891 Long term (current) use of opiate analgesic: Secondary | ICD-10-CM | POA: Diagnosis not present

## 2017-04-01 DIAGNOSIS — Z510 Encounter for antineoplastic radiation therapy: Secondary | ICD-10-CM | POA: Diagnosis not present

## 2017-04-01 DIAGNOSIS — G8929 Other chronic pain: Secondary | ICD-10-CM | POA: Diagnosis not present

## 2017-04-01 DIAGNOSIS — Z8249 Family history of ischemic heart disease and other diseases of the circulatory system: Secondary | ICD-10-CM | POA: Diagnosis not present

## 2017-04-01 DIAGNOSIS — F329 Major depressive disorder, single episode, unspecified: Secondary | ICD-10-CM | POA: Diagnosis not present

## 2017-04-01 DIAGNOSIS — M1990 Unspecified osteoarthritis, unspecified site: Secondary | ICD-10-CM | POA: Diagnosis not present

## 2017-04-01 DIAGNOSIS — C3411 Malignant neoplasm of upper lobe, right bronchus or lung: Secondary | ICD-10-CM | POA: Diagnosis not present

## 2017-04-01 DIAGNOSIS — M549 Dorsalgia, unspecified: Secondary | ICD-10-CM | POA: Diagnosis not present

## 2017-04-01 DIAGNOSIS — J449 Chronic obstructive pulmonary disease, unspecified: Secondary | ICD-10-CM | POA: Diagnosis not present

## 2017-04-01 DIAGNOSIS — Z79899 Other long term (current) drug therapy: Secondary | ICD-10-CM | POA: Diagnosis not present

## 2017-04-01 DIAGNOSIS — Z9889 Other specified postprocedural states: Secondary | ICD-10-CM | POA: Diagnosis not present

## 2017-04-01 DIAGNOSIS — K469 Unspecified abdominal hernia without obstruction or gangrene: Secondary | ICD-10-CM | POA: Diagnosis not present

## 2017-04-01 DIAGNOSIS — Z87891 Personal history of nicotine dependence: Secondary | ICD-10-CM | POA: Diagnosis not present

## 2017-04-01 DIAGNOSIS — I10 Essential (primary) hypertension: Secondary | ICD-10-CM | POA: Diagnosis not present

## 2017-04-01 DIAGNOSIS — F419 Anxiety disorder, unspecified: Secondary | ICD-10-CM | POA: Diagnosis not present

## 2017-04-07 ENCOUNTER — Encounter: Payer: Self-pay | Admitting: *Deleted

## 2017-04-07 DIAGNOSIS — C3411 Malignant neoplasm of upper lobe, right bronchus or lung: Secondary | ICD-10-CM | POA: Diagnosis not present

## 2017-04-07 DIAGNOSIS — J449 Chronic obstructive pulmonary disease, unspecified: Secondary | ICD-10-CM

## 2017-04-09 DIAGNOSIS — J189 Pneumonia, unspecified organism: Secondary | ICD-10-CM | POA: Diagnosis not present

## 2017-04-09 DIAGNOSIS — J449 Chronic obstructive pulmonary disease, unspecified: Secondary | ICD-10-CM | POA: Diagnosis not present

## 2017-04-09 DIAGNOSIS — K458 Other specified abdominal hernia without obstruction or gangrene: Secondary | ICD-10-CM | POA: Diagnosis not present

## 2017-04-13 DIAGNOSIS — J449 Chronic obstructive pulmonary disease, unspecified: Secondary | ICD-10-CM

## 2017-04-13 NOTE — Progress Notes (Signed)
Daily Session Note  Patient Details  Name: Stanley Rios. MRN: [id number] Date of Birth: 15-Feb-1956 Referring Provider:     Pulmonary Rehab from 01/05/2017 in [facility] Cardiac and Pulmonary Rehab  Referring Provider  [name] MD      Encounter Date: 04/13/2017  Check In:     Session Check In - 04/13/17 1136      Check-In   Location [facility]-Cardiac & Pulmonary Rehab   Staff Present [name], BA, ACSM CEP, Exercise Physiologist;Johndaniel Catlin Tedd Sias, BS, ACSM CEP, Exercise Physiologist   Supervising physician immediately available to respond to emergencies [name] immediately available ER MD   Physician(s) Dr. Corky Downs and Jimmye Norman   Medication changes reported     No   Fall or balance concerns reported    No   Warm-up and Cool-down Performed as group-led instruction   Resistance Training Performed Yes   VAD Patient? No     Pain Assessment   Currently in Pain? No/denies   Multiple Pain Sites No         History  Smoking Status  . Former Smoker  . Packs/day: 0.00  . Years: 40.00  . Types: Cigarettes  . Quit date: 05/28/2016  Smokeless Tobacco  . Former Systems developer    Comment: continues to use patches    Goals Met:  Improved SOB with ADL's Exercise tolerated well Personal goals reviewed No report of cardiac concerns or symptoms Strength training completed today  Goals Unmet:  Not Applicable  Comments: Pt able to follow exercise prescription today without complaint.  Will continue to monitor for progression.   Dr. Emily Filbert is Medical Director for [place] and [name].

## 2017-04-17 ENCOUNTER — Encounter: Payer: PPO | Admitting: *Deleted

## 2017-04-17 DIAGNOSIS — J449 Chronic obstructive pulmonary disease, unspecified: Secondary | ICD-10-CM | POA: Diagnosis not present

## 2017-04-17 NOTE — Progress Notes (Signed)
Daily Session Note  Patient Details  Name: Stanley Rios. MRN: [id number] Date of Birth: Sep 11, 1955 Referring Provider:     Pulmonary Rehab from 01/05/2017 in [facility] Cardiac and Pulmonary Rehab  Referring Provider  [name] MD      Encounter Date: 04/17/2017  Check In:     Session Check In - 04/17/17 1129      Check-In   Location [facility]-Cardiac & Pulmonary Rehab   Staff Present Renita Papa, RN BSN;Jessica Luan Pulling, MA, ACSM RCEP, Exercise Physiologist;[name]   Supervising physician immediately available to respond to emergencies [name] immediately available ER MD   Physician(s) Dr. Archie Balboa and Joni Fears    Medication changes reported     No   Fall or balance concerns reported    No   Warm-up and Cool-down Performed as group-led instruction   Resistance Training Performed Yes   VAD Patient? No     Pain Assessment   Currently in Pain? No/denies         History  Smoking Status  . Former Smoker  . Packs/day: 0.00  . Years: 40.00  . Types: Cigarettes  . Quit date: 05/28/2016  Smokeless Tobacco  . Former Systems developer    Comment: continues to use patches    Goals Met:  Proper associated with RPD/PD & O2 Sat Independence with exercise equipment Using PLB without cueing & demonstrates good technique Exercise tolerated well Strength training completed today  Goals Unmet:  Not Applicable  Comments: Pt able to follow exercise prescription today without complaint.  Will continue to monitor for progression.    Dr. Emily Filbert is Medical Director for [place] and [name].

## 2017-04-20 ENCOUNTER — Ambulatory Visit: Payer: PPO | Admitting: Pain Medicine

## 2017-04-20 DIAGNOSIS — J449 Chronic obstructive pulmonary disease, unspecified: Secondary | ICD-10-CM

## 2017-04-20 NOTE — Progress Notes (Signed)
Daily Session Note  Patient Details  Name: Stanley Rios. MRN: [id number] Date of Birth: 1956/01/30 Referring Provider:     Pulmonary Rehab from 01/05/2017 in [facility] Cardiac and Pulmonary Rehab  Referring Provider  [name] MD      Encounter Date: 04/20/2017  Check In:     Session Check In - 04/20/17 1146      Check-In   Location [facility]-Cardiac & Pulmonary Rehab   Staff Present [name], BA, ACSM CEP, Exercise Physiologist;[name], BS, ACSM CEP, Exercise Physiologist;Paralee Pendergrass Flavia Shipper   Supervising physician immediately available to respond to emergencies [name] immediately available ER MD   Physician(s) Dr. Clearnce Hasten and [facility]   Medication changes reported     No   Fall or balance concerns reported    No   Warm-up and Cool-down Performed as group-led instruction   Resistance Training Performed Yes   VAD Patient? No     Pain Assessment   Currently in Pain? No/denies   Multiple Pain Sites No         History  Smoking Status  . Former Smoker  . Packs/day: 0.00  . Years: 40.00  . Types: Cigarettes  . Quit date: 05/28/2016  Smokeless Tobacco  . Former Systems developer    Comment: continues to use patches    Goals Met:  Independence with exercise equipment Exercise tolerated well No report of cardiac concerns or symptoms Strength training completed today  Goals Unmet:  Not Applicable  Comments: Pt able to follow exercise prescription today without complaint.  Will continue to monitor for progression.   Dr. Emily Filbert is Medical Director for [place] and [name].

## 2017-04-20 NOTE — Progress Notes (Signed)
Pulmonary Individual Treatment Plan  Patient Details  Name: Stanley Rios. MRN: [id number] Date of Birth: September 15, 1955 Referring Provider:     Pulmonary Rehab from 01/05/2017 in [facility] Cardiac and Pulmonary Rehab  Referring Provider  [name] MD      Initial Encounter Date:    Pulmonary Rehab from 01/05/2017 in [facility] Cardiac and Pulmonary Rehab  Date  01/05/17  Referring Provider  [name] MD      Visit Diagnosis: Chronic obstructive pulmonary disease, unspecified COPD type ([place])  Patient's Home Medications on Admission:  Current Outpatient Prescriptions:  .  albuterol (PROAIR HFA) 108 (90 Base) MCG/ACT inhaler, INHALE 2 PUFFS EVERY 4 HOURS AS NEEDED, Disp: , Rfl:  .  albuterol (PROVENTIL HFA;VENTOLIN HFA) 108 (90 Base) MCG/ACT inhaler, Inhale 2 puffs into the lungs every 4 (four) hours as needed for wheezing or shortness of breath. Reported on 10/26/2015, Disp: , Rfl:  .  buPROPion (WELLBUTRIN) 75 MG tablet, Take 75 mg by mouth 2 (two) times daily. Reported on 10/26/2015, Disp: , Rfl:  .  Cholecalciferol (VITAMIN D3) 2000 units capsule, TK 1 C PO D, Disp: , Rfl: 0 .  Cyanocobalamin (VITAMELTS ENERGY VITAMIN B-12) 1500 MCG TBDP, Take 1 tablet by mouth daily., Disp: 180 tablet, Rfl: 0 .  cyclobenzaprine (FLEXERIL) 10 MG tablet, Take 1 tablet (10 mg total) by mouth 3 (three) times daily as needed for muscle spasms., Disp: 90 tablet, Rfl: 2 .  diltiazem (CARDIZEM SR) 90 MG 12 hr capsule, TAKE ONE CAPSULE BY MOUTH TWICE DAILY, Disp: , Rfl:  .  DULoxetine (CYMBALTA) 60 MG capsule, Take 60 mg by mouth daily. Reported on 10/26/2015, Disp: , Rfl:  .  finasteride (PROSCAR) 5 MG tablet, Take 5 mg by mouth daily., Disp: , Rfl:  .  fluticasone (FLONASE) 50 MCG/ACT nasal spray, SHAKE LIQUID AND USE 2 SPRAYS IN EACH NOSTRIL DAILY, Disp: , Rfl:  .  Fluticasone-Salmeterol (ADVAIR DISKUS) 250-50 MCG/DOSE AEPB, Inhale into the lungs., Disp: , Rfl:  .  furosemide (LASIX) 20 MG tablet, Take 1  tablet (20 mg total) by mouth daily. For 1 week and then when necessary as needed for swelling and edema, Disp: 30 tablet, Rfl: 0 .  gabapentin (NEURONTIN) 800 MG tablet, Take 1 tablet (800 mg total) by mouth every 8 (eight) hours., Disp: 90 tablet, Rfl: 2 .  ipratropium (ATROVENT HFA) 17 MCG/ACT inhaler, Inhale 2 puffs into the lungs every 6 (six) hours., Disp: , Rfl:  .  ipratropium-albuterol (DUONEB) 0.5-2.5 (3) MG/3ML SOLN, Take 3 mLs by nebulization every 4 (four) hours as needed., Disp: , Rfl:  .  lidocaine (XYLOCAINE) 5 % ointment, Apply 1 application topically See admin instructions. 2 to 4 times daily, Disp: , Rfl:  .  meloxicam (MOBIC) 15 MG tablet, Take 1 tablet (15 mg total) by mouth daily., Disp: 30 tablet, Rfl: 2 .  metoprolol tartrate (LOPRESSOR) 25 MG tablet, Take 1 tablet (25 mg total) by mouth 2 (two) times daily., Disp: 60 tablet, Rfl: 0 .  nicotine (NICODERM CQ - DOSED IN MG/24 HOURS) 21 mg/24hr patch, Place 21 mg onto the skin daily. , Disp: , Rfl:  .  omeprazole (PRILOSEC) 40 MG capsule, Take 40 mg by mouth 2 (two) times daily., Disp: , Rfl:  .  polyethylene glycol (MIRALAX / GLYCOLAX) packet, Take 17 g by mouth daily., Disp: , Rfl:  .  predniSONE (DELTASONE) 10 MG tablet, Take 1 tablet by mouth once., Disp: , Rfl:  .  simvastatin (  ZOCOR) 20 MG tablet, Take 20 mg by mouth at bedtime. , Disp: , Rfl:  .  tamsulosin (FLOMAX) 0.4 MG CAPS capsule, Take 0.4 mg by mouth daily., Disp: , Rfl:  .  tiotropium (SPIRIVA HANDIHALER) 18 MCG inhalation capsule, Place into inhaler and inhale., Disp: , Rfl:  .  traMADol (ULTRAM) 50 MG tablet, Take 1 tablet (50 mg total) by mouth every 6 (six) hours as needed., Disp: 120 tablet, Rfl: 2 .  traZODone (DESYREL) 50 MG tablet, Take 150 mg by mouth at bedtime., Disp: , Rfl:  .  Vitamin D, Ergocalciferol, (DRISDOL) 50000 units CAPS capsule, Take 1 capsule (50,000 Units total) by mouth 2 (two) times a week x 6 weeks., Disp: 12 capsule, Rfl: 0 .  zinc  oxide 20 % ointment, Apply topically., Disp: , Rfl:   Past Medical History: Past Medical History:  Diagnosis Date  . Anxiety   . Chronic back pain   . Chronic neck pain   . COPD (chronic obstructive pulmonary disease) ([place])   . Depression   . Hyperlipidemia   . Hypertension   . Opiate abuse, continuous   . Pneumonia     Tobacco Use: History  Smoking Status  . Former Smoker  . Packs/day: 0.00  . Years: 40.00  . Types: Cigarettes  . Quit date: 05/28/2016  Smokeless Tobacco  . 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 Rehab Latest Ref Rng & Units 01/16/2016   PHART 7.350 - 7.450 7.44   PCO2ART 32.0 - 48.0 mmHg 42   HCO3 21.0 - 28.0 mEq/L 28.5(H)   O2SAT % 88.3       Pulmonary Assessment Scores:     Pulmonary Assessment Scores    Row Name 01/05/17 1612 03/04/17 1236       ADL UCSD   ADL Phase Entry Mid    SOB Score total 52 34    Rest 0 0    Walk 2 1    Stairs 3 2    Bath 3 4    Dress 0 1    Shop 3 2      mMRC Score   mMRC Score 1  -       Pulmonary Function Assessment:     Pulmonary Function Assessment - 01/05/17 1609      Pulmonary Function Tests   FVC% 27 %   FEV1% 27 %   FEV1/FVC Ratio 74.48     Breath   Bilateral Breath Sounds Clear   Shortness of Breath Yes;Fear of Shortness of Breath;Limiting activity      Exercise Target Goals:    Exercise Program Goal: Individual exercise prescription set with THRR, safety & activity barriers. Participant demonstrates ability to understand and report RPE using BORG scale, to self-measure pulse accurately, and to acknowledge the importance of the exercise prescription.  Exercise Prescription Goal: Starting with aerobic activity 30 plus minutes a day, 3 days per week for initial exercise prescription. Provide home exercise prescription and guidelines that participant acknowledges understanding prior to  discharge.  Activity Barriers & Risk Stratification:     Activity Barriers & Cardiac Risk Stratification - 01/05/17 1605      Activity Barriers & Cardiac Risk Stratification   Activity Barriers Back Problems;Deconditioning;Muscular Weakness;Shortness of Breath;Balance Concerns      6 Minute Walk:     6 Minute Walk    Row Name 01/05/17  1601         6 Minute Walk   Phase Initial     Distance 625 feet     Walk Time 4.13 minutes     # of Rest Breaks 2  57 sec, 55 sec     MPH 1.72     METS 2.47     RPE 15     Perceived Dyspnea  4     VO2 Peak 8.63     Symptoms Yes (comment)     Comments SOB, legs cramping     Resting HR 101 bpm     Resting BP 122/64     Max Ex. HR 114 bpm     Max Ex. BP 132/64     2 Minute Post BP 128/70       Interval HR   Baseline HR (retired) 101     1 Minute HR 104     2 Minute HR 103     3 Minute HR 108     4 Minute HR 109     5 Minute HR 112     6 Minute HR 114     2 Minute Post HR 109     Interval Heart Rate? Yes       Interval Oxygen   Interval Oxygen? Yes     Baseline Oxygen Saturation % 95 %     Resting Liters of Oxygen 3 L     1 Minute Oxygen Saturation % 94 %     1 Minute Liters of Oxygen 3 L     2 Minute Oxygen Saturation % 94 %     2 Minute Liters of Oxygen 3 L     3 Minute Oxygen Saturation % 94 %     3 Minute Liters of Oxygen 3 L     4 Minute Oxygen Saturation % 96 %     4 Minute Liters of Oxygen 3 L     5 Minute Oxygen Saturation % 95 %     5 Minute Liters of Oxygen 3 L     6 Minute Oxygen Saturation % 95 %     6 Minute Liters of Oxygen 3 L     2 Minute Post Oxygen Saturation % 96 %     2 Minute Post Liters of Oxygen 3 L       Oxygen Initial Assessment:     Oxygen Initial Assessment - 01/05/17 1616      Home Oxygen   Home Oxygen Device Home Concentrator;E-Tanks   Sleep Oxygen Prescription Continuous   Liters per minute 3   Home Exercise Oxygen Prescription Continuous   Liters per minute 3   Home at Rest  Exercise Oxygen Prescription Continuous   Liters per minute 3   Compliance with Home Oxygen Use Yes     Initial 6 min Walk   Oxygen Used Continuous;E-Tanks   Liters per minute 3   Resting Oxygen Saturation  95 %   Exercise Oxygen Saturation  during 6 min walk 94 %     Program Oxygen Prescription   Program Oxygen Prescription Continuous;E-Tanks   Liters per minute 3     Intervention   Short Term Goals To learn and understand importance of monitoring SPO2 with pulse oximeter and demonstrate accurate use of the pulse oximeter.;To learn and exhibit compliance with exercise, home and travel O2 prescription;To Learn and understand importance of maintaining oxygen saturations>88%   Long  Term Goals Exhibits compliance with exercise,  home and travel O2 prescription;Maintenance of O2 saturations>88%;Verbalizes importance of monitoring SPO2 with pulse oximeter and return demonstration      Oxygen Re-Evaluation:     Oxygen Re-Evaluation    Row Name 01/14/17 1524 02/11/17 1519 03/18/17 1359 04/13/17 1155       Program Oxygen Prescription   Program Oxygen Prescription  - Continuous;E-Tanks E-Tanks;Continuous E-Tanks;Continuous    Liters per minute  - '3 3 3      '$ Home Oxygen   Home Oxygen Device  - E-Tanks;Home Concentrator E-Tanks;Home Concentrator E-Tanks;Home Concentrator;Portable Concentrator    Sleep Oxygen Prescription  - Continuous Continuous Continuous    Liters per minute  - '3 3 3    '$ Home Exercise Oxygen Prescription  - Continuous Continuous Continuous    Liters per minute  - '4 4 4    '$ Home at Rest Exercise Oxygen Prescription  - Continuous Continuous Continuous    Liters per minute  - '3 3 3    '$ Compliance with Home Oxygen Use  - Yes  - Yes      Goals/Expected Outcomes   Short Term Goals To learn and demonstrate proper purse lipped breathing techniques or other breathing techniques. To Learn and understand importance of maintaining oxygen saturations>88%;To learn and  demonstrate proper purse lipped breathing techniques or other breathing techniques.;To learn and understand importance of monitoring SPO2 with pulse oximeter and demonstrate accurate use of the pulse oximeter.;To learn and demonstrate proper use of respiratory medications;To learn and exhibit compliance with exercise, home and travel O2 prescription To learn and exhibit compliance with exercise, home and travel O2 prescription;To learn and understand importance of monitoring SPO2 with pulse oximeter and demonstrate accurate use of the pulse oximeter.;To Learn and understand importance of maintaining oxygen saturations>88%;To learn and demonstrate proper purse lipped breathing techniques or other breathing techniques.;To learn and demonstrate proper use of respiratory medications To learn and exhibit compliance with exercise, home and travel O2 prescription;To learn and understand importance of maintaining oxygen saturations>88%;To learn and demonstrate proper use of respiratory medications;To learn and demonstrate proper pursed lip breathing techniques or other breathing techniques.;To learn and understand importance of monitoring SPO2 with pulse oximeter and demonstrate accurate use of the pulse oximeter.    Long  Term Goals  - Exhibits compliance with exercise, home and travel O2 prescription;Maintenance of O2 saturations>88%;Compliance with respiratory medication;Exhibits proper breathing techniques, such as purse lipped breathing or other method taught during program session;Demonstrates proper use of MDI's;Verbalizes importance of monitoring SPO2 with pulse oximeter and return demonstration Exhibits compliance with exercise, home and travel O2 prescription;Maintenance of O2 saturations>88%;Compliance with respiratory medication;Demonstrates proper use of MDI's;Exhibits proper breathing techniques, such as purse lipped breathing or other method taught during program session;Verbalizes importance of  monitoring SPO2 with pulse oximeter and return demonstration Exhibits compliance with exercise, home and travel O2 prescription;Maintenance of O2 saturations>88%;Compliance with respiratory medication;Demonstrates proper use of MDI's;Exhibits proper breathing techniques, such as pursed lip breathing or other method taught during program session;Verbalizes importance of monitoring SPO2 with pulse oximeter and return demonstration    Comments Stanley Rios was taught how purse lip breathing is to help with his shortness of breath while exercising.  Stanley Rios has been doing well with his oxygen therapy.  He is compliant at home and while in program. He has had good O2 saturations.  He is using PLB fairly independent and has found it helpful at home too.  He needs more work on diaphragmatic breathing for relaxation and breath control.  He carries  his pulse oximeter with him everywhere and checks his saturations when he is feeling low.  He has not had any problems with his medications and is doing a better job of keeping his spacer clean. Stanley Rios exercise tolerance has gone up since starting the program. His heart rate has not dropped like it has in the past and he is getting proficient with his PLB. He is checks his oxygen saturations at home and in class and knows he needs to be 88% or above. He is using his medications properly and verbalizes understanding of how to use them. Since Stanley Rios has been absent from the [name] due to radiation therapy his oxygen saturation have been in the upper 90's. He has a new portable concentrater making it easier for him to get around. Stanley Rios is using his PLB techniques at home and currently in class.     Goals/Expected Outcomes Short Term: Stanley. Hyun needs to be more proficient in  purse lipped breathing techniques. Long Term: Patient to be independent with purse lip breathing techniques. Short: Continue to work on breathing techniques.  Long: Continue to be independent with his oxygen  therapy. Short: continue to work on PLB and monitor spo2 at home. Long: Be independent with PLB and use his respiratory medications properly. Short: Work on PLB. to improve oxygenation. Long: Titrate oxygen to 3 liters while exercising.        Oxygen Discharge (Final Oxygen Re-Evaluation):     Oxygen Re-Evaluation - 04/13/17 1155      Program Oxygen Prescription   Program Oxygen Prescription E-Tanks;Continuous   Liters per minute 3     Home Oxygen   Home Oxygen Device E-Tanks;Home Concentrator;Portable Concentrator   Sleep Oxygen Prescription Continuous   Liters per minute 3   Home Exercise Oxygen Prescription Continuous   Liters per minute 4   Home at Rest Exercise Oxygen Prescription Continuous   Liters per minute 3   Compliance with Home Oxygen Use Yes     Goals/Expected Outcomes   Short Term Goals To learn and exhibit compliance with exercise, home and travel O2 prescription;To learn and understand importance of maintaining oxygen saturations>88%;To learn and demonstrate proper use of respiratory medications;To learn and demonstrate proper pursed lip breathing techniques or other breathing techniques.;To learn and understand importance of monitoring SPO2 with pulse oximeter and demonstrate accurate use of the pulse oximeter.   Long  Term Goals Exhibits compliance with exercise, home and travel O2 prescription;Maintenance of O2 saturations>88%;Compliance with respiratory medication;Demonstrates proper use of MDI's;Exhibits proper breathing techniques, such as pursed lip breathing or other method taught during program session;Verbalizes importance of monitoring SPO2 with pulse oximeter and return demonstration   Comments Since Stanley Rios has been absent from the [name] due to radiation therapy his oxygen saturation have been in the upper 90's. He has a new portable concentrater making it easier for him to get around. Stanley Rios is using his PLB techniques at home and currently in class.     Goals/Expected Outcomes Short: Work on PLB. to improve oxygenation. Long: Titrate oxygen to 3 liters while exercising.       Initial Exercise Prescription:     Initial Exercise Prescription - 01/05/17 1600      Date of Initial Exercise RX and Referring Provider   Date 01/05/17   Referring Provider Hessie Dibble MD     Oxygen   Oxygen Continuous   Liters 3     Treadmill   MPH 1.7   Grade 0   Minutes 15  METs 2.3     NuStep   Level 1   SPM 80   Minutes 15   METs 2     REL-XR   Level 1   Speed 50   Minutes 15   METs 2     Prescription Details   Frequency (times per week) 3   Duration Progress to 45 minutes of aerobic exercise without signs/symptoms of physical distress     Intensity   THRR 40-80% of Max Heartrate 124-147   Ratings of Perceived Exertion 11-13   Perceived Dyspnea 0-4     Progression   Progression Continue to progress workloads to maintain intensity without signs/symptoms of physical distress.     Resistance Training   Training Prescription Yes   Weight 3 lbs   Reps 10-15      Perform Capillary Blood Glucose checks as needed.  Exercise Prescription Changes:     Exercise Prescription Changes    Row Name 01/14/17 1400 01/27/17 1400 02/10/17 1600 02/11/17 1500 02/24/17 1500     Response to Exercise   Blood Pressure (Admit) 142/74 130/66 116/68  - 114/60   Blood Pressure (Exercise) 142/80 128/70 124/70  - 164/78   Blood Pressure (Exit) 120/80 118/68 112/60  - 120/76   Heart Rate (Admit) 104 bpm 98 bpm 108 bpm  - 102 bpm   Heart Rate (Exercise) 118 bpm 105 bpm 108 bpm  - 119 bpm   Heart Rate (Exit) 105 bpm 102 bpm 112 bpm  - 108 bpm   Oxygen Saturation (Admit) 93 % 94 % 91 %  - 97 %   Oxygen Saturation (Exercise) 88 % 92 % 94 %  - 92 %   Oxygen Saturation (Exit) 97 % 97 % 96 %  - 98 %   Rating of Perceived Exertion (Exercise) '17 13 13  '$ - 15   Perceived Dyspnea (Exercise) '4 2 2  '$ - 2   Symptoms SOB, fatigued SOB, fatigued SOB, fatigued   - fatigue   Comments first full day of exercise  -  -  -  -   Duration Progress to 45 minutes of aerobic exercise without signs/symptoms of physical distress Progress to 45 minutes of aerobic exercise without signs/symptoms of physical distress Continue with 45 min of aerobic exercise without signs/symptoms of physical distress.  - Continue with 45 min of aerobic exercise without signs/symptoms of physical distress.   Intensity THRR unchanged THRR unchanged THRR unchanged  - THRR unchanged     Progression   Progression Continue to progress workloads to maintain intensity without signs/symptoms of physical distress. Continue to progress workloads to maintain intensity without signs/symptoms of physical distress. Continue to progress workloads to maintain intensity without signs/symptoms of physical distress.  - Continue to progress workloads to maintain intensity without signs/symptoms of physical distress.   Average METs 1.49 1.69 1.76  - 2.26     Resistance Training   Training Prescription Yes Yes Yes  - Yes   Weight 3 lbs 3 lbs 3 lbs  - 4 lbs   Reps 10-15 10-15 10-15  - 10-15     Interval Training   Interval Training No No No  - No     Oxygen   Oxygen Continuous Continuous Continuous  - Continuous   Liters '3 3 3  '$ - 3     Treadmill   MPH '1 1 1  '$ - 1.4   Grade 0 0 0  - 0   Minutes 4  2 min, 1:40  7  3 min 2 min 2 min 15  - 15   METs 1.77 1.77 1.77  - 2.07     NuStep   Level '1 1 2  '$ - 5   SPM 56 74 59  - 68   Minutes '15 15 15  '$ - 15   METs 1.5 2 1.8  - 2.4     REL-XR   Level '1 1 1  '$ - 3   Speed 30 39  -  - 45   Minutes '15 15 15  '$ - 15   METs 1.2 1.3 1.7  - 2.3     Home Exercise Plan   Plans to continue exercise at  -  -  - [employer] (comment)  [employer] and walking [employer] (comment)  [employer] and walking   Frequency  -  -  - Add 1 additional day to program exercise sessions. Add 1 additional day to program exercise sessions.   Initial Home  Exercises Provided  -  -  - 02/11/17 02/11/17   Row Name 03/10/17 1500 03/25/17 1500           Response to Exercise   Blood Pressure (Admit) 110/64 98/62      Blood Pressure (Exercise) 145/78  -      Blood Pressure (Exit) 118/84 120/60      Heart Rate (Admit) 110 bpm 95 bpm      Heart Rate (Exercise) 119 bpm 116 bpm      Heart Rate (Exit) 104 bpm 90 bpm      Oxygen Saturation (Admit) 98 % 97 %      Oxygen Saturation (Exercise) 94 % 94 %      Oxygen Saturation (Exit) 97 % 95 %      Rating of Perceived Exertion (Exercise) 15 13      Perceived Dyspnea (Exercise) 2 1      Symptoms fatigue and SOB on treadmill back pain      Duration Continue with 45 min of aerobic exercise without signs/symptoms of physical distress. Continue with 45 min of aerobic exercise without signs/symptoms of physical distress.      Intensity THRR unchanged THRR unchanged        Progression   Progression Continue to progress workloads to maintain intensity without signs/symptoms of physical distress. Continue to progress workloads to maintain intensity without signs/symptoms of physical distress.      Average METs 2.67 2.67        Resistance Training   Training Prescription Yes Yes      Weight 4 lbs 4 lbs      Reps 10-15 10-15        Interval Training   Interval Training No No        Oxygen   Oxygen Continuous Continuous      Liters 3 3        Treadmill   MPH 1.3  -      Grade 0  -      Minutes 15  -      METs 2  -        NuStep   Level 5 5      SPM 79 79      Minutes 15 15      METs 2.3 2.7        REL-XR   Level 5 6      Speed 45 50      Minutes 15 15  METs 3.7 3.7        Home Exercise Plan   Plans to continue exercise at [employer] (comment)  [employer] and walking [employer] (comment)  [place] and walking      Frequency Add 1 additional day to program exercise sessions. Add 1 additional day to program exercise sessions.      Initial Home Exercises  Provided 02/11/17 02/11/17         Exercise Comments:     Exercise Comments    Row Name 01/14/17 1456 01/14/17 1530         Exercise Comments First full day of exercise!  Patient was oriented to gym and equipment including functions, settings, policies, and procedures.  Patient's individual exercise prescription and treatment plan were reviewed.  All starting workloads were established based on the results of the 6 minute walk test done at initial orientation visit.  The plan for exercise progression was also introduced and progression will be customized based on patient's performance and goals. Stanley Rios was exercising on the treadmill and was short of breath after 2 minutes. He did intervals of about two minutes, pulse and spO2 was checked when regaining his breath. S HR stayed was reading in the 60's but then increased into the 100's. Two sat probes were used to determine if the reading was correct. He was placed on telemetry for any drops in heart rate while exercising but now significant changes were noted.         Exercise Goals and Review:     Exercise Goals    Row Name 01/05/17 1607             Exercise Goals   Increase Physical Activity Yes       Intervention Provide advice, education, support and counseling about physical activity/exercise needs.;Develop an individualized exercise prescription for aerobic and resistive training based on initial evaluation findings, risk stratification, comorbidities and participant's personal goals.       Expected Outcomes Achievement of increased cardiorespiratory fitness and enhanced flexibility, muscular endurance and strength shown through measurements of functional capacity and personal statement of participant.       Increase Strength and Stamina Yes       Intervention Provide advice, education, support and counseling about physical activity/exercise needs.;Develop an individualized exercise prescription for aerobic and resistive  training based on initial evaluation findings, risk stratification, comorbidities and participant's personal goals.       Expected Outcomes Achievement of increased cardiorespiratory fitness and enhanced flexibility, muscular endurance and strength shown through measurements of functional capacity and personal statement of participant.          Exercise Goals Re-Evaluation :     Exercise Goals Re-Evaluation    Row Name 01/27/17 1426 02/10/17 1615 02/11/17 1503 02/24/17 1543 03/10/17 1504     Exercise Goal Re-Evaluation   Exercise Goals Review Increase Physical Activity;Increase Strenth and Stamina Increase Physical Activity;Increase Strenth and Stamina Increase Physical Activity;Increase Strenth and Stamina Increase Physical Activity;Increase Strenth and Stamina Increase Physical Activity;Increase Strenth and Stamina   Comments Stanley Rios is off to a good start in rehab.  He missed a week due to doctor's appointments.  He is working on increasing his time on the treadmill.  Currently he is up to 7 min total.  We will continue to monitor his progress. Stanley Rios is doing well in rehab.  He is getting stronger and able to do more.  He is now up ot 15 minutes on the treadmill!! He  has also increased the NuStep to level 4.  We will continue to monitor his progression.   Stanley Rios feels like he is improving.  He is doing his 15 min on the treadmill.  He is feeling stronger and has more stamina already.  Reviewed home exercise with pt today.  Pt plans to walk and go to [employer] for exercise.  Reviewed THR, pulse, RPE, sign and symptoms, and when to call 911 or MD.  Also discussed weather considerations and indoor options.  We also discussed how to slowly progress his home exercise.  Pt voiced understanding. Stanley Rios has continued to do well in rehab.  He is now up to 1.4 mph on the treadmill.  He is not currently doing his home exercise until cleared by cardiology.  However, we did discuss that it also does not mean  that he can just go home and sit all day.  He needs to stay active. We will continue to monitor his progression.  Stanley Rios continues to work hard in rehab.  He is already at the half way point.  He is concerned about his cancer diagnosis and how it will affect him going forward.  He has moved up to level 5 on the XR.  We will continue to monitor his progression.   Expected Outcomes Short: Continue to increase time on treadmill.  Long; Continue to attend class to work on strength and stamina. Short: Try to increase workload on XR and attend more regularly by scheduling appointments around rehab.  Long: Increase strength and stamina. Short: Add in at least extra day of exercise at home and to attend class regularly.  Long: Exercise more frequently. Short: Increase speed/spm on both XR and NuStep.  Long: Move more at home. Short: Work on bringing speed back up on treadmill.  Long: Continue to increase activity level at home.    [place] Name 03/25/17 1453 04/07/17 1523           Exercise Goal Re-Evaluation   Exercise Goals Review Increase Physical Activity;Increase Strength and Stamina  -      Comments Stanley Rios continues to do well in rehab. He continues to have problems with his back and has stayed off the treadmill.   He is doing 30 min on the NuStep instead.  He also increased his XR to level 6.  We will continue to monitor his progression. Out since last review with doctor's appointments      Expected Outcomes Short: Increase workload on NuStep and maintain spm for full 55mn.  Long: Increase activity at home.   -         Discharge Exercise Prescription (Final Exercise Prescription Changes):     Exercise Prescription Changes - 03/25/17 1500      Response to Exercise   Blood Pressure (Admit) 98/62   Blood Pressure (Exit) 120/60   Heart Rate (Admit) 95 bpm   Heart Rate (Exercise) 116 bpm   Heart Rate (Exit) 90 bpm   Oxygen Saturation (Admit) 97 %   Oxygen Saturation (Exercise) 94 %   Oxygen Saturation  (Exit) 95 %   Rating of Perceived Exertion (Exercise) 13   Perceived Dyspnea (Exercise) 1   Symptoms back pain   Duration Continue with 45 min of aerobic exercise without signs/symptoms of physical distress.   Intensity THRR unchanged     Progression   Progression Continue to progress workloads to maintain intensity without signs/symptoms of physical distress.   Average METs 2.67     Resistance Training  Training Prescription Yes   Weight 4 lbs   Reps 10-15     Interval Training   Interval Training No     Oxygen   Oxygen Continuous   Liters 3     NuStep   Level 5   SPM 79   Minutes 15   METs 2.7     REL-XR   Level 6   Speed 50   Minutes 15   METs 3.7     Home Exercise Plan   Plans to continue exercise at [employer] (comment)  [employer] and walking   Frequency Add 1 additional day to program exercise sessions.   Initial Home Exercises Provided 02/11/17      Nutrition:  Target Goals: Understanding of nutrition guidelines, daily intake of sodium '1500mg'$ , cholesterol '200mg'$ , calories 30% from fat and 7% or less from saturated fats, daily to have 5 or more servings of fruits and vegetables.  Biometrics:     Pre Biometrics - 01/05/17 1607      Pre Biometrics   Height 5' 10.9" (1.801 m)   Weight 191 lb 9.6 oz (86.9 kg)   Waist Circumference 43 inches   Hip Circumference 37 inches   Waist to Hip Ratio 1.16 %   BMI (Calculated) 26.9       Nutrition Therapy Plan and Nutrition Goals:   Nutrition Discharge: Rate Your Plate Scores:   Nutrition Goals Re-Evaluation:     Nutrition Goals Re-Evaluation    [place] Name 02/11/17 1526 04/13/17 1218           Goals   Current Weight 199 lb (90.3 kg) 206 lb (93.4 kg)      Nutrition Goal  - Stanley Rios wants to weigh 165lbs      Comment Scheduled appt for 7/30 at 10am with dietician.  Would like to discuss diet for weight loss.  Stanley Rios states he had to cancel his last nutrition appointment.       Expected  Outcome  - Short: Lose weight and meet with the Nutritionist. Long: Maintain weightloss after [name].         Nutrition Goals Discharge (Final Nutrition Goals Re-Evaluation):     Nutrition Goals Re-Evaluation - 04/13/17 1218      Goals   Current Weight 206 lb (93.4 kg)   Nutrition Goal Stanley Rios wants to weigh 165lbs   Comment Stanley Rios states he had to cancel his last nutrition appointment.    Expected Outcome Short: Lose weight and meet with the Nutritionist. Long: Maintain weightloss after [name].      Psychosocial: Target Goals: Acknowledge presence or absence of significant depression and/or stress, maximize coping skills, provide positive support system. Participant is able to verbalize types and ability to use techniques and skills needed for reducing stress and depression.   Initial Review & Psychosocial Screening:     Initial Psych Review & Screening - 01/05/17 Stanley Rios? Yes   Comments Stanley Rios has good support from his wife and one son. He states he has no depression and is looking forward to increasing his stamina and energy level through his participation in [place].     Barriers   Psychosocial barriers to participate in program There are no identifiable barriers or psychosocial needs.;The patient should benefit from training in stress management and relaxation.     Screening Interventions   Interventions Encouraged to exercise      Quality of Life Scores:  Quality of Life - 01/05/17 1626      Quality of Life Scores   Health/Function Pre 19.81 %   Socioeconomic Pre 19 %   Psych/Spiritual Pre 20.14 %   Family Pre 20.8 %   GLOBAL Pre 19.86 %      PHQ-9: Recent Review Flowsheet Data    Depression screen [facility] 2/9 03/04/2017 01/22/2017 01/13/2017 01/05/2017 12/31/2016   Decreased Interest 2 0 1 1 0   Down, Depressed, Hopeless 1 0 3 0 0   PHQ - 2 Score 3 0 4 1 0   Altered sleeping 0 - 1 1 -   Tired, decreased energy 2 -  1 1 -   Change in appetite 2 - 0 0 -   Feeling bad or failure about yourself  2 - 1 1 -   Trouble concentrating 0 - 0 0 -   Moving slowly or fidgety/restless 1 - 0 0 -   Suicidal thoughts 0 - 0 0 -   PHQ-9 Score 10 - 7 4 -   Difficult doing work/chores - - Somewhat difficult - -     Interpretation of Total Score  Total Score Depression Severity:  1-4 = Minimal depression, 5-9 = Mild depression, 10-14 = Moderate depression, 15-19 = Moderately severe depression, 20-27 = Severe depression   Psychosocial Evaluation and Intervention:     Psychosocial Evaluation - 01/26/17 1220      Psychosocial Evaluation & Interventions   Interventions Stress management education;Encouraged to exercise with the program and follow exercise prescription;Relaxation education   Comments Counselor met with Stanley Rios Stanley Rios) today for initial psychosocial evaluation.  He is a 61 year old who has been diagnosed with both COPD and Emphysema.  He also is concerned about a PET Scan for cancer screening in his lungs this Thursday.  He has a strong support system with a spouse of 28 years; a son locally and mother a step-sister who live next door.  Stanley Rios reports he has his days and nights mixed up when it comes to sleeping.  He is on medication to help with this.  He has a good appetite.  Stanley Rios reports he has a history of depression and is on medication that treats most of these symptoms, but is considering asking the Dr. to increase this.  He states his mood is typically positive.  Stanley Rios has multiple stressors in his life currently with the PET Scan this week; his own health and finances.  Stanley Rios has goals to improve his breathing; increase his mobility and ability to walk and possibly lose some weight.  Staff will be following with Stanley Rios throughout the course of this program.     Expected Outcomes Stanley Rios will benefit from consistent exercise to achieve his stated goals.  He also will benefit from meeting with the dietician to  address his weight loss goal.  Stanley Rios has multiple stressors in his life so participation in the psychoeducational components will help increase his coping strategies.  Counselor will be following with Stanley Rios.   Continue Psychosocial Services  Follow up required by counselor      Psychosocial Re-Evaluation:     Psychosocial Re-Evaluation    [place] Name 03/09/17 1233             Psychosocial Re-Evaluation   Current issues with Current Stress Concerns;Current Sleep Concerns;Current Psychotropic Meds;History of Depression;Current Depression       Comments Counselor follow up with Stanley Rios today reporting breathing better and using a little less oxygen when  at home since coming in to this class.  He is also noticing his legs are stronger.  However, Stanley Rios is seeing an oncologist later this week due to a spot found on his lungs.  He was told this is 98% positive for cancer and he is not strong enough for a biopsy or surgery; so Stanley Rios is concerned to say the least.  He is also struggling with chronic back pain which is impacting his quality of life.  Counselor encouraged Stanley Rios to speak with his Dr. about possibly increasing his Cymbalta since his depression inventory indicated his symptoms for depression have increased with all that is going on in his life currently.  He agreed to do so. Counselor will follow with Stanley Rios on this.       Expected Outcomes Stanley Rios will contact his Drs office today about his mood medications to see if an increase to help with his current depressive symptoms would be possible.       Continue Psychosocial Services  Follow up required by counselor          Psychosocial Discharge (Final Psychosocial Re-Evaluation):     Psychosocial Re-Evaluation - 03/09/17 1233      Psychosocial Re-Evaluation   Current issues with Current Stress Concerns;Current Sleep Concerns;Current Psychotropic Meds;History of Depression;Current Depression   Comments Counselor follow up with Stanley Rios today reporting  breathing better and using a little less oxygen when at home since coming in to this class.  He is also noticing his legs are stronger.  However, Stanley Rios is seeing an oncologist later this week due to a spot found on his lungs.  He was told this is 98% positive for cancer and he is not strong enough for a biopsy or surgery; so Stanley Rios is concerned to say the least.  He is also struggling with chronic back pain which is impacting his quality of life.  Counselor encouraged Stanley Rios to speak with his Dr. about possibly increasing his Cymbalta since his depression inventory indicated his symptoms for depression have increased with all that is going on in his life currently.  He agreed to do so. Counselor will follow with Stanley Rios on this.   Expected Outcomes Stanley Rios will contact his Drs office today about his mood medications to see if an increase to help with his current depressive symptoms would be possible.   Continue Psychosocial Services  Follow up required by counselor      Education: Education Goals: Education classes will be provided on a weekly basis, covering required topics. Participant will state understanding/return demonstration of topics presented.  Learning Barriers/Preferences:     Learning Barriers/Preferences - 01/05/17 1608      Learning Barriers/Preferences   Learning Barriers None   Learning Preferences None      Education Topics: Initial Evaluation Education: - Verbal, written and demonstration of respiratory meds, RPE/PD scales, oximetry and breathing techniques. Instruction on use of nebulizers and MDIs: cleaning and proper use, rinsing mouth with steroid doses and importance of monitoring MDI activations.   Pulmonary Rehab from 04/17/2017 in [facility] Cardiac and Pulmonary Rehab  Date  01/05/17  Educator  LB  Instruction Review Code (retired)  2- meets goals/outcomes      General Nutrition Guidelines/Fats and Fiber: -Group instruction provided by verbal, written material, models and  posters to present the general guidelines for heart healthy nutrition. Gives an explanation and review of dietary fats and fiber.   Pulmonary Rehab from 04/17/2017 in [facility] Cardiac and Pulmonary Rehab  Date  03/16/17  Educator  CR      Controlling Sodium/Reading Food Labels: -Group verbal and written material supporting the discussion of sodium use in heart healthy nutrition. Review and explanation with models, verbal and written materials for utilization of the food label.   Pulmonary Rehab from 04/17/2017 in [facility] Cardiac and Pulmonary Rehab  Date  03/23/17  Educator  CR  Instruction Review Code  1- Verbalizes Understanding      Exercise Physiology & Risk Factors: - Group verbal and written instruction with models to review the exercise physiology of the cardiovascular system and associated critical values. Details cardiovascular disease risk factors and the goals associated with each risk factor.   Pulmonary Rehab from 04/17/2017 in [facility] Cardiac and Pulmonary Rehab  Date  02/04/17  Educator  AS      Aerobic Exercise & Resistance Training: - Gives group verbal and written discussion on the health impact of inactivity. On the components of aerobic and resistive training programs and the benefits of this training and how to safely progress through these programs.   Pulmonary Rehab from 04/17/2017 in [facility] Cardiac and Pulmonary Rehab  Date  02/20/17  Educator  [facility]      Flexibility, Balance, General Exercise Guidelines: - Provides group verbal and written instruction on the benefits of flexibility and balance training programs. Provides general exercise guidelines with specific guidelines to those with heart or lung disease. Demonstration and skill practice provided.   Stress Management: - Provides group verbal and written instruction about the health risks of elevated stress, cause of high stress, and healthy ways to reduce stress.   Depression: - Provides group verbal and written  instruction on the correlation between heart/lung disease and depressed mood, treatment options, and the stigmas associated with seeking treatment.   Pulmonary Rehab from 04/17/2017 in [facility] Cardiac and Pulmonary Rehab  Date  03/18/17  Educator  [facility]  Instruction Review Code (retired)  2- meets goals/outcomes      Exercise & Equipment Safety: - Individual verbal instruction and demonstration of equipment use and safety with use of the equipment.   Pulmonary Rehab from 04/17/2017 in [facility] Cardiac and Pulmonary Rehab  Date  01/14/17  Educator  [facility]      Infection Prevention: - Provides verbal and written material to individual with discussion of infection control including proper hand washing and proper equipment cleaning during exercise session.   Pulmonary Rehab from 04/17/2017 in [facility] Cardiac and Pulmonary Rehab  Date  01/14/17  Educator  [facility]      Falls Prevention: - Provides verbal and written material to individual with discussion of falls prevention and safety.   Pulmonary Rehab from 04/17/2017 in [facility] Cardiac and Pulmonary Rehab  Date  01/05/17  Educator  LB  Instruction Review Code (retired)  2- meets goals/outcomes      Diabetes: - Individual verbal and written instruction to review signs/symptoms of diabetes, desired ranges of glucose level fasting, after meals and with exercise. Advice that pre and post exercise glucose checks will be done for 3 sessions at entry of program.   Chronic Lung Diseases: - Group verbal and written instruction to review new updates, new respiratory medications, new advancements in procedures and treatments. Provide informative websites and "800" numbers of self-education.   Pulmonary Rehab from 04/17/2017 in [facility] Cardiac and Pulmonary Rehab  Date  02/11/17  Educator  [facility]      Lung Procedures: - Group verbal and written instruction to describe testing methods done to diagnose lung disease. Review the  outcome of test results. Describe the treatment  choices: Pulmonary Function Tests, ABGs and oximetry.   Energy Conservation: - Provide group verbal and written instruction for methods to conserve energy, plan and organize activities. Instruct on pacing techniques, use of adaptive equipment and posture/positioning to relieve shortness of breath.   Triggers: - Group verbal and written instruction to review types of environmental controls: home humidity, furnaces, filters, dust mite/pet prevention, HEPA vacuums. To discuss weather changes, air quality and the benefits of nasal washing.   Exacerbations: - Group verbal and written instruction to provide: warning signs, infection symptoms, calling MD promptly, preventive modes, and value of vaccinations. Review: effective airway clearance, coughing and/or vibration techniques. Create an Sports administrator.   Pulmonary Rehab from 04/17/2017 in [facility] Cardiac and Pulmonary Rehab  Date  02/25/17  Educator  [facility]      Oxygen: - Individual and group verbal and written instruction on oxygen therapy. Includes supplement oxygen, available portable oxygen systems, continuous and intermittent flow rates, oxygen safety, concentrators, and Medicare reimbursement for oxygen.   Pulmonary Rehab from 04/17/2017 in [facility] Cardiac and Pulmonary Rehab  Date  01/05/17  Educator  LB      Respiratory Medications: - Group verbal and written instruction to review medications for lung disease. Drug class, frequency, complications, importance of spacers, rinsing mouth after steroid MDI's, and proper cleaning methods for nebulizers.   Pulmonary Rehab from 04/17/2017 in [facility] Cardiac and Pulmonary Rehab  Date  01/05/17  Educator  LB  Instruction Review Code (retired)  2- meets goals/outcomes      AED/CPR: - Group verbal and written instruction with the use of models to demonstrate the basic use of the AED with the basic ABC's of resuscitation.   Pulmonary Rehab from 04/17/2017 in [facility] Cardiac and Pulmonary Rehab  Date   04/17/17  Educator  CE  Instruction Review Code  1- Verbalizes Understanding      Breathing Retraining: - Provides individuals verbal and written instruction on purpose, frequency, and proper technique of diaphragmatic breathing and pursed-lipped breathing. Applies individual practice skills.   Pulmonary Rehab from 04/17/2017 in [facility] Cardiac and Pulmonary Rehab  Date  01/05/17  Educator  LB      Anatomy and Physiology of the Lungs: - Group verbal and written instruction with the use of models to provide basic lung anatomy and physiology related to function, structure and complications of lung disease.   Anatomy & Physiology of the Heart: - Group verbal and written instruction and models provide basic cardiac anatomy and physiology, with the coronary electrical and arterial systems. Review of: AMI, Angina, Valve disease, Heart Failure, Cardiac Arrhythmia, Pacemakers, and the ICD.   Pulmonary Rehab from 04/17/2017 in [facility] Cardiac and Pulmonary Rehab  Date  03/06/17  Educator  [facility]      Heart Failure: - Group verbal and written instruction on the basics of heart failure: signs/symptoms, treatments, explanation of ejection fraction, enlarged heart and cardiomyopathy.   Pulmonary Rehab from 04/17/2017 in [facility] Cardiac and Pulmonary Rehab  Date  03/06/17  Educator  [facility]      Sleep Apnea: - Individual verbal and written instruction to review Obstructive Sleep Apnea. Review of risk factors, methods for diagnosing and types of masks and machines for OSA.   Anxiety: - Provides group, verbal and written instruction on the correlation between heart/lung disease and anxiety, treatment options, and management of anxiety.   Relaxation: - Provides group, verbal and written instruction about the benefits of relaxation for patients with  heart/lung disease. Also provides patients with examples of relaxation techniques.   Cardiac Medications: - Group verbal and written instruction to review  commonly prescribed medications for heart disease. Reviews the medication, class of the drug, and side effects.   Know Your Numbers: -Group verbal and written instruction about important numbers in your health.  Review of Cholesterol, Blood Pressure, Diabetes, and BMI and the role they play in your overall health.   Other: -Provides group and verbal instruction on various topics (see comments)    Knowledge Questionnaire Score:     Knowledge Questionnaire Score - 01/05/17 1609      Knowledge Questionnaire Score   Pre Score 8/10       Core Components/Risk Factors/Patient Goals at Admission:     Personal Goals and Risk Factors at Admission - 01/05/17 1618      Core Components/Risk Factors/Patient Goals on Admission    Weight Management Yes;Weight Loss   Intervention Weight Management: Develop a combined nutrition and exercise program designed to reach desired caloric intake, while maintaining appropriate intake of nutrient and fiber, sodium and fats, and appropriate energy expenditure required for the weight goal.;Weight Management: Provide education and appropriate resources to help participant work on and attain dietary goals.   Admit Weight 191 lb 9.6 oz (86.9 kg)   Goal Weight: Short Term 186 lb (84.4 kg)   Goal Weight: Long Term 160 lb (72.6 kg)   Expected Outcomes Short Term: Continue to assess and modify interventions until short term weight is achieved;Weight Maintenance: Understanding of the daily nutrition guidelines, which includes 25-35% calories from fat, 7% or less cal from saturated fats, less than '200mg'$  cholesterol, less than 1.5gm of sodium, & 5 or more servings of fruits and vegetables daily;Weight Loss: Understanding of general recommendations for a balanced deficit meal plan, which promotes 1-2 lb weight loss per week and includes a negative energy balance of [phone] kcal/d;Understanding recommendations for meals to include 15-35% energy as protein, 25-35% energy  from fat, 35-60% energy from carbohydrates, less than '200mg'$  of dietary cholesterol, 20-35 gm of total fiber daily;Understanding of distribution of calorie intake throughout the day with the consumption of 4-5 meals/snacks   Tobacco Cessation Yes   Number of packs per day Quit 11/17; still using nicotine patches   Improve shortness of breath with ADL's Yes   Intervention Provide education, individualized exercise plan and daily activity instruction to help decrease symptoms of SOB with activities of daily living.   Expected Outcomes Short Term: Achieves a reduction of symptoms when performing activities of daily living.   Develop more efficient breathing techniques such as purse lipped breathing and diaphragmatic breathing; and practicing self-pacing with activity Yes   Intervention Provide education, demonstration and support about specific breathing techniuqes utilized for more efficient breathing. Include techniques such as pursed lipped breathing, diaphragmatic breathing and self-pacing activity.   Expected Outcomes Short Term: Participant will be able to demonstrate and use breathing techniques as needed throughout daily activities.   Increase knowledge of respiratory medications and ability to use respiratory devices properly  Yes  Advair, Spiriva, Atrovent, Albuterol SVN and MDI; spacer given   Intervention Provide education and demonstration as needed of appropriate use of medications, inhalers, and oxygen therapy.   Expected Outcomes Short Term: Achieves understanding of medications use. Understands that oxygen is a medication prescribed by physician. Demonstrates appropriate use of inhaler and oxygen therapy.   Hypertension Yes   Intervention Provide education on lifestyle modifcations including regular physical activity/exercise, weight management, moderate  sodium restriction and increased consumption of fresh fruit, vegetables, and low fat dairy, alcohol moderation, and smoking  cessation.;Monitor prescription use compliance.   Expected Outcomes Short Term: Continued assessment and intervention until BP is < 140/66m HG in hypertensive participants. < 130/819mHG in hypertensive participants with diabetes, heart failure or chronic kidney disease.;Long Term: Maintenance of blood pressure at goal levels.      Core Components/Risk Factors/Patient Goals Review:      Goals and Risk Factor Review    Row Name 02/11/17 1508 03/04/17 1221 04/13/17 1203         Core Components/Risk Factors/Patient Goals Review   Personal Goals Review Hypertension;Tobacco Cessation;Weight Management/Obesity;Improve shortness of breath with ADL's Weight Management/Obesity;Increase knowledge of respiratory medications and ability to use respiratory devices properly.;Develop more efficient breathing techniques such as purse lipped breathing and diaphragmatic breathing and practicing self-pacing with activity.;Improve shortness of breath with ADL's;Stress Weight Management/Obesity;Stress;Increase knowledge of respiratory medications and ability to use respiratory devices properly.     Review RiLiliane Channelas been doing well in rehab.  He is feeling stronger and already has more stamina as well.   He is able to do more at home with less SOB.  However, his weight is starting to trend up.  He has quit smoking completely and stopped using the patches a week ago!!  He is trying to eat better, but continues to see weight gain.  He did mention that his stomach is bothersome with both swelling and acid.  He also has lots of issues with consitpation with his hernias.  We discussed meeting with dietician and made him an appointment that his wife can also attend.  Stanley Rios checks his blood pressure regularly at home. He has not been recording his numbers but plans to start tracking to take with him to appointment with cardiologist on 8/3.   RiLiliane Channeleported feeling better since beginning classes. He does express concerns about his  wife continuing to smoke, but she has no plans to quit around him. RiLiliane Channels continuing to practice pursed lip breathing and using his medications correctly. His main concern is his pain when he walks, and he is in line to see a doctor at the pain clinic to help with this. His cardiologist told him he has an irregular heart beat and to rest if it starts to beat too slow or too fast.  His appointment with the nutritionist had to be cancelled, but he reported that he would let the staff know when he is ready to make another appointment as he still wishes to have help changing his eating habits.  RiLiliane Channels back to [place] after being out for radiation therapy. He has been maintaining his weight since he has been out. His stress levels have been down since he obtained a portable concentrator. He plans to keep attending [name] since his next radiation appointment is in December. He is using his nebulizer and inhalers as prescribed.     Expected Outcomes Short: Attend nutrition appointment and work on weight.  Long: Continue to work on risk factors modifications. Short: he wants to meet with the nutrionist soon when it is better timing for him and his wife; Long: he wants to get in with the pain clinic to manage pain control Short: Work on losing weight and decrease stress levels. Long: Maintain Weightloss and keep stress to a minimum after [name].        Core Components/Risk Factors/Patient Goals at Discharge (Final Review):  Goals and Risk Factor Review - 04/13/17 1203      Core Components/Risk Factors/Patient Goals Review   Personal Goals Review Weight Management/Obesity;Stress;Increase knowledge of respiratory medications and ability to use respiratory devices properly.   Review Stanley Rios is back to [place] today after being out for radiation therapy. He has been maintaining his weight since he has been out. His stress levels have been down since he obtained a portable concentrator. He plans to  keep attending [name] since his next radiation appointment is in December. He is using his nebulizer and inhalers as prescribed.   Expected Outcomes Short: Work on losing weight and decrease stress levels. Long: Maintain Weightloss and keep stress to a minimum after [name].      ITP Comments:     ITP Comments    Row Name 01/26/17 2111 02/13/17 1140 02/23/17 0911 02/23/17 1545 03/18/17 1353   ITP Comments 30 day note review with Dr Emily Filbert, Medical Director of [facility] Patient attended education "Know Your Numbers" 30 day review completed ITP sent to Dr. Ramonita Lab for Dr. Emily Filbert Director of [facility]. Continue with ITP unless changes are made by physician.  Spoke with Stanley Rios and the effects of second hand smoke that his wife may be causing. He stated that she wants to quit but has not tried yet. Informed patient that his health is at risk with second hand smoke. He is going to ask her to at least not smoke in the car before class. Stanley Rios will be out September 5th,7th,10th and 12th due to radiation therapy for a Small mass in his lung. He is going to try to attend if he is feeling up to it.   [place] Name 03/23/17 0845 04/07/17 1521 04/13/17 1226 04/20/17 0843     ITP Comments 30 day review completed. ITP sent to Dr. Emily Filbert Director of [facility]. Continue with ITP unless changes are made by physician.   Stanley Rios has been out since 03/23/17.  He is undergoing multiple appointments for his possible lung cancer.  He is scheduled to return on 9/17. Stanley Rios is back to [place] after his radiation therapy. He is doing very well with his exercises considering he has been out. His stress levels have gone down since he obtained a portable oxygen concentrator.  30 day review completed. ITP sent to Dr. Emily Filbert Director of [facility]. Continue with ITP unless changes are made by physician.         Comments: 30 day review

## 2017-04-21 ENCOUNTER — Encounter: Payer: PPO | Admitting: Nurse Practitioner

## 2017-04-21 ENCOUNTER — Encounter: Payer: Self-pay | Admitting: Pain Medicine

## 2017-04-21 ENCOUNTER — Ambulatory Visit (HOSPITAL_BASED_OUTPATIENT_CLINIC_OR_DEPARTMENT_OTHER): Payer: PPO | Admitting: Pain Medicine

## 2017-04-21 ENCOUNTER — Ambulatory Visit
Admission: RE | Admit: 2017-04-21 | Discharge: 2017-04-21 | Disposition: A | Discharge status: Home / Self Care | Payer: PPO | Source: Ambulatory Visit | Attending: Pain Medicine | Admitting: Pain Medicine

## 2017-04-21 VITALS — BP 114/77 | HR 88 | Temp 97.0°F | Resp 24 | Ht 71.0 in | Wt 208.0 lb

## 2017-04-21 DIAGNOSIS — G894 Chronic pain syndrome: Secondary | ICD-10-CM | POA: Insufficient documentation

## 2017-04-21 DIAGNOSIS — M791 Myalgia: Secondary | ICD-10-CM | POA: Insufficient documentation

## 2017-04-21 DIAGNOSIS — M5136 Other intervertebral disc degeneration, lumbar region: Secondary | ICD-10-CM | POA: Insufficient documentation

## 2017-04-21 DIAGNOSIS — M792 Neuralgia and neuritis, unspecified: Secondary | ICD-10-CM

## 2017-04-21 DIAGNOSIS — M545 Low back pain, unspecified: Secondary | ICD-10-CM

## 2017-04-21 DIAGNOSIS — G8918 Other acute postprocedural pain: Secondary | ICD-10-CM | POA: Diagnosis not present

## 2017-04-21 DIAGNOSIS — M1990 Unspecified osteoarthritis, unspecified site: Secondary | ICD-10-CM | POA: Diagnosis not present

## 2017-04-21 DIAGNOSIS — Z981 Arthrodesis status: Secondary | ICD-10-CM | POA: Diagnosis not present

## 2017-04-21 DIAGNOSIS — Z9049 Acquired absence of other specified parts of digestive tract: Secondary | ICD-10-CM | POA: Diagnosis not present

## 2017-04-21 DIAGNOSIS — M4696 Unspecified inflammatory spondylopathy, lumbar region: Secondary | ICD-10-CM | POA: Diagnosis not present

## 2017-04-21 DIAGNOSIS — M7918 Myalgia, other site: Secondary | ICD-10-CM

## 2017-04-21 DIAGNOSIS — M159 Polyosteoarthritis, unspecified: Secondary | ICD-10-CM

## 2017-04-21 DIAGNOSIS — M47816 Spondylosis without myelopathy or radiculopathy, lumbar region: Secondary | ICD-10-CM

## 2017-04-21 DIAGNOSIS — G8929 Other chronic pain: Secondary | ICD-10-CM

## 2017-04-21 DIAGNOSIS — M150 Primary generalized (osteo)arthritis: Secondary | ICD-10-CM

## 2017-04-21 MED ORDER — FENTANYL CITRATE (PF) 100 MCG/2ML IJ SOLN
25.0000 ug | INTRAMUSCULAR | Status: DC | PRN
  Administered 2017-04-21: 50 ug via INTRAVENOUS
  Filled 2017-04-21: qty 2

## 2017-04-21 MED ORDER — TRAMADOL HCL 50 MG PO TABS: 50.0000 mg | ORAL_TABLET | Freq: Four times a day (QID) | ORAL | 5 refills | Status: DC | PRN

## 2017-04-21 MED ORDER — TRIAMCINOLONE ACETONIDE 40 MG/ML IJ SUSP
40.0000 mg | Freq: Once | INTRAMUSCULAR | Status: AC
  Administered 2017-04-21: 40 mg
  Filled 2017-04-21: qty 1

## 2017-04-21 MED ORDER — CYCLOBENZAPRINE HCL 10 MG PO TABS: 10.0000 mg | ORAL_TABLET | Freq: Three times a day (TID) | ORAL | 0 refills | Status: DC | PRN

## 2017-04-21 MED ORDER — ROPIVACAINE HCL 2 MG/ML IJ SOLN
9.0000 mL | Freq: Once | INTRAMUSCULAR | Status: AC
  Administered 2017-04-21: 9 mL via PERINEURAL
  Filled 2017-04-21: qty 10

## 2017-04-21 MED ORDER — GABAPENTIN 800 MG PO TABS: 800.0000 mg | ORAL_TABLET | Freq: Three times a day (TID) | ORAL | 0 refills | Status: DC

## 2017-04-21 MED ORDER — LACTATED RINGERS IV SOLN
1000.0000 mL | Freq: Once | INTRAVENOUS | Status: AC
  Administered 2017-04-21: 1000 mL via INTRAVENOUS

## 2017-04-21 MED ORDER — OXYCODONE-ACETAMINOPHEN 5-325 MG PO TABS: 1.0000 | ORAL_TABLET | Freq: Four times a day (QID) | ORAL | 0 refills | Status: DC | PRN

## 2017-04-21 MED ORDER — MIDAZOLAM HCL 5 MG/5ML IJ SOLN
1.0000 mg | INTRAMUSCULAR | Status: DC | PRN
  Administered 2017-04-21: 2 mg via INTRAVENOUS
  Filled 2017-04-21: qty 5

## 2017-04-21 MED ORDER — LIDOCAINE HCL 2 % IJ SOLN
10.0000 mL | Freq: Once | INTRAMUSCULAR | Status: AC
  Administered 2017-04-21: 400 mg
  Filled 2017-04-21: qty 100

## 2017-04-21 MED ORDER — MELOXICAM 15 MG PO TABS: 15.0000 mg | ORAL_TABLET | Freq: Every day | ORAL | 0 refills | Status: DC

## 2017-04-21 NOTE — Progress Notes (Addendum)
Nursing Pain Medication Assessment:  Safety precautions to be maintained throughout the outpatient stay will include: orient to surroundings, keep bed in low position, maintain call bell within reach at all times, provide assistance with transfer out of bed and ambulation.  Medication Inspection Compliance: Pill count conducted under aseptic conditions, in front of the patient. Neither the pills nor the bottle was removed from the patient's sight at any time. Once count was completed pills were immediately returned to the patient in their original bottle.  Medication: Tramadol (Ultram) Pill/Patch Count: 23 of 120 pills remain Pill/Patch Appearance: Markings consistent with prescribed medication Bottle Appearance: Standard pharmacy container. Clearly labeled. Filled Date:08 /30/ 2018 Last Medication intake:  Today   Patient states he received flu shot 13 days ago.  Dr Dossie Arbour notified.  Informed patient that the steroid in the injection today could take away the effectiveness of the flu shot.  Patient and wife both agree that they would like to proceed with the injection and would notify their PCP.

## 2017-04-21 NOTE — Progress Notes (Signed)
Patient's Name: Stanley Rios.  MRN: [id number]  Referring Provider: Milinda Pointer, MD  DOB: May 27, 1956  PCP: Marygrace Drought, MD  DOS: 04/21/2017  Note by: Gaspar Cola, MD  Service setting: Ambulatory outpatient  Specialty: Interventional Pain Management  Patient type: Established  Location: [facility] (AMB) Pain Management Facility  Visit type: Interventional Procedure   Primary Reason for Visit: Interventional Pain Management Treatment. CC: Back Pain (low)  Procedure:  Anesthesia, Analgesia, Anxiolysis:  Type: Therapeutic Medial Branch Facet Radiofrequency Ablation Region: Lumbar Level: L2, L3, L4, L5, & S1 Medial Branch Level(s) Laterality: Right-Sided  Type: Local Anesthesia with Moderate (Conscious) Sedation Local Anesthetic: Lidocaine 1% Route: Intravenous (IV) IV Access: Secured Sedation: Meaningful verbal contact was maintained at all times during the procedure  Indication(s): Analgesia and Anxiety  Indications: 1. Lumbar facet syndrome (Bilateral) (R>L)   2. DDD (degenerative disc disease), lumbar   3. Chronic low back pain (Location of Primary Source of Pain) (Bilateral) (R>L)   4. Acute postoperative pain   5. Chronic pain syndrome   6. Musculoskeletal pain   7. Neurogenic pain   8. Osteoarthritis    Stanley Rios has either failed to respond, was unable to tolerate, or simply did not get enough benefit from other more conservative therapies including, but not limited to: 1. Over-the-counter medications 2. Anti-inflammatory medications 3. Muscle relaxants 4. Membrane stabilizers 5. Opioids 6. Physical therapy 7. Modalities (Heat, ice, etc.) 8. Invasive techniques such as nerve blocks. Stanley Rios has attained more than 50% relief of the pain from a series of diagnostic injections conducted in separate occasions.  Pain Score: Pre-procedure: 3 /10 Post-procedure: 1 /10  Pre-op Assessment:  Mr. Stanley Rios is a 61 y.o. (year old), male patient, seen today for  interventional treatment. He  has a past surgical history that includes Cholecystectomy; Spinal fusion; Neck surgery (2002 approx); and Hernia repair. [name] has a current medication list which includes the following prescription(s): acetaminophen, albuterol, albuterol, bupropion, vitamin d3, clonazepam, cyanocobalamin, cyclobenzaprine, diltiazem, duloxetine, finasteride, fluticasone, furosemide, gabapentin, ibuprofen, ipratropium, lidocaine, meloxicam, metoprolol tartrate, nicotine, omeprazole, polyethylene glycol, potassium chloride er, prednisone, promethazine, simvastatin, tamsulosin, tiotropium, tramadol, trazodone, vitamin d (ergocalciferol), zinc oxide, albuterol, bupropion, duloxetine, fluticasone-salmeterol, furosemide, gabapentin, ipratropium-albuterol, meloxicam, oxycodone-acetaminophen, polyethylene glycol powder, potassium chloride sa, prednisone, tamsulosin, and trazodone, and the following Facility-Administered Medications: fentanyl and midazolam. His primarily concern today is the Back Pain (low)  Initial Vital Signs: There were no vitals taken for this visit. BMI: Estimated body mass index is 29.01 kg/m as calculated from the following:   Height as of this encounter: 5\' 11"  (1.803 m).   Weight as of this encounter: 208 lb (94.3 kg).  Risk Assessment: Allergies: Reviewed. He has No Known Allergies.  Allergy Precautions: None required Coagulopathies: Reviewed. None identified.  Blood-thinner therapy: None at this time Active Infection(s): Reviewed. None identified. Stanley Rios is afebrile  Site Confirmation: Stanley Rios was asked to confirm the procedure and laterality before marking the site Procedure checklist: Completed Consent: Before the procedure and under the influence of no sedative(s), amnesic(s), or anxiolytics, the patient was informed of the treatment options, risks and possible complications. To fulfill our ethical and legal obligations, as recommended by the American  Medical Association's Code of Ethics, I have informed the patient of my clinical impression; the nature and purpose of the treatment or procedure; the risks, benefits, and possible complications of the intervention; the alternatives, including doing nothing; the risk(s) and benefit(s) of the alternative treatment(s) or procedure(s); and the  risk(s) and benefit(s) of doing nothing. The patient was provided information about the general risks and possible complications associated with the procedure. These may include, but are not limited to: failure to achieve desired goals, infection, bleeding, organ or nerve damage, allergic reactions, paralysis, and death. In addition, the patient was informed of those risks and complications associated to Spine-related procedures, such as failure to decrease pain; infection (i.e.: Meningitis, epidural or intraspinal abscess); bleeding (i.e.: epidural hematoma, subarachnoid hemorrhage, or any other type of intraspinal or peri-dural bleeding); organ or nerve damage (i.e.: Any type of peripheral nerve, nerve root, or spinal cord injury) with subsequent damage to sensory, motor, and/or autonomic systems, resulting in permanent pain, numbness, and/or weakness of one or several areas of the body; allergic reactions; (i.e.: anaphylactic reaction); and/or death. Furthermore, the patient was informed of those risks and complications associated with the medications. These include, but are not limited to: allergic reactions (i.e.: anaphylactic or anaphylactoid reaction(s)); adrenal axis suppression; blood sugar elevation that in diabetics may result in ketoacidosis or comma; water retention that in patients with history of congestive heart failure may result in shortness of breath, pulmonary edema, and decompensation with resultant heart failure; weight gain; swelling or edema; medication-induced neural toxicity; particulate matter embolism and blood vessel occlusion with resultant organ,  and/or nervous system infarction; and/or aseptic necrosis of one or more joints. Finally, the patient was informed that Medicine is not an exact science; therefore, there is also the possibility of unforeseen or unpredictable risks and/or possible complications that may result in a catastrophic outcome. The patient indicated having understood very clearly. We have given the patient no guarantees and we have made no promises. Enough time was given to the patient to ask questions, all of which were answered to the patient's satisfaction. Mr. Cefalu has indicated that he wanted to continue with the procedure. Attestation: I, the ordering provider, attest that I have discussed with the patient the benefits, risks, side-effects, alternatives, likelihood of achieving goals, and potential problems during recovery for the procedure that I have provided informed consent. Date: 04/21/2017; Time: 8:09 AM  Pre-Procedure Preparation:  Monitoring: As per clinic protocol. Respiration, ETCO2, SpO2, BP, heart rate and rhythm monitor placed and checked for adequate function Safety Precautions: Patient was assessed for positional comfort and pressure points before starting the procedure. Time-out: I initiated and conducted the "Time-out" before starting the procedure, as per protocol. The patient was asked to participate by confirming the accuracy of the "Time Out" information. Verification of the correct person, site, and procedure were performed and confirmed by me, the nursing staff, and the patient. "Time-out" conducted as per Joint Commission's Universal Protocol (UP.01.01.01). "Time-out" Date & Time: 04/21/2017; 1100 hrs.  Description of Procedure Process:   Position: Prone Target Area: For Lumbar Facet blocks, the target is the groove formed by the junction of the transverse process and superior articular process. For the L5 dorsal ramus, the target is the notch between superior articular process and sacral ala. For  the S1 dorsal ramus, the target is the superior and lateral edge of the posterior S1 Sacral foramen. Approach: Paraspinal approach. Area Prepped: Entire Posterior Lumbosacral Region Prepping solution: Hibiclens (4.0% Chlorhexidine gluconate solution) Safety Precautions: Aspiration looking for blood return was conducted prior to all injections. At no point did we inject any substances, as a needle was being advanced. No attempts were made at seeking any paresthesias. Safe injection practices and needle disposal techniques used. Medications properly checked for expiration dates. SDV (single  dose vial) medications used. Description of the Procedure: Protocol guidelines were followed. The patient was placed in position over the fluoroscopy table. The target area was identified and the area prepped in the usual manner. Skin desensitized using vapocoolant spray. Skin & deeper tissues infiltrated with local anesthetic. Appropriate amount of time allowed to pass for local anesthetics to take effect. Radiofrequency needles were introduced to the area of the medial branch at the junction of the superior articular process and transverse process using fluoroscopy. Using the [employer], sensory stimulation using 50 Hz was used to locate & identify the nerve, making sure that the needle was positioned such that there was no sensory stimulation below 0.3 V or above 0.7 V. Stimulation using 2 Hz was used to evaluate the motor component. Care was taken not to lesion any nerves that demonstrated motor stimulation of the lower extremities at an output of less than 2.5 times that of the sensory threshold, or a maximum of 2.0 V. Once satisfactory placement of the needles was achieved, the above solution was slowly injected after negative aspiration. After waiting for at least 2 minutes, the ablation was performed at 80 degrees C for 60 seconds.The needles were then removed and the area cleansed, making sure to  leave some of the prepping solution back to take advantage of its long term bactericidal properties. Intra-operative Compliance: Compliant  Illustration of the posterior view of the lumbar spine and the posterior neural structures. Laminae of L2 through S1 are labeled. DPRL5, dorsal primary ramus of L5; DPRS1, dorsal primary ramus of S1; DPR3, dorsal primary ramus of L3; FJ, facet (zygapophyseal) joint L3-L4; I, inferior articular process of L4; LB1, lateral branch of dorsal primary ramus of L1; IAB, inferior articular branches from L3 medial branch (supplies L4-L5 facet joint); IBP, intermediate branch plexus; MB3, medial branch of dorsal primary ramus of L3; NR3, third lumbar nerve root; S, superior articular process of L5; SAB, superior articular branches from L4 (supplies L4-5 facet joint also); TP3, transverse process of L3.  Vitals:   04/21/17 1135 04/21/17 1140 04/21/17 1150 04/21/17 1200  BP: 115/77 114/77 133/78 114/77  Pulse:      Resp: 18 14 (!) 26 (!) 24  Temp:  (!) 97.2 F (36.2 C)  (!) 97 F (36.1 C)  TempSrc:  Temporal    SpO2: 93% 93% 96% 96%  Weight:      Height:        Start Time: 1101 hrs. End Time:   hrs. Materials & Medications:  Needle(s) Type: Teflon-coated, curved tip, Radiofrequency needle(s) Gauge: 22G Length: 10cm Medication(s): We administered lactated ringers, midazolam, fentaNYL, lidocaine, triamcinolone acetonide, and ropivacaine (PF) 2 mg/mL (0.2%). Please see chart orders for dosing details.  Imaging Guidance (Spinal):  Type of Imaging Technique: Fluoroscopy Guidance (Spinal) Indication(s): Assistance in needle guidance and placement for procedures requiring needle placement in or near specific anatomical locations not easily accessible without such assistance. Exposure Time: Please see nurses notes. Contrast: None used. Fluoroscopic Guidance: I was personally present during the use of fluoroscopy. "Tunnel Vision Technique" used to obtain the best  possible view of the target area. Parallax error corrected before commencing the procedure. "Direction-depth-direction" technique used to introduce the needle under continuous pulsed fluoroscopy. Once target was reached, antero-posterior, oblique, and lateral fluoroscopic projection used confirm needle placement in all planes. Images permanently stored in EMR. Interpretation: No contrast injected. I personally interpreted the imaging intraoperatively. Adequate needle placement confirmed in multiple planes. Permanent images saved into  the patient's record.  Antibiotic Prophylaxis:  Indication(s): None identified Antibiotic given: None  Post-operative Assessment:  EBL: None Complications: No immediate post-treatment complications observed by team, or reported by patient. Note: The patient tolerated the entire procedure well. A repeat set of vitals were taken after the procedure and the patient was kept under observation following institutional policy, for this type of procedure. Post-procedural neurological assessment was performed, showing return to baseline, prior to discharge. The patient was provided with post-procedure discharge instructions, including a section on how to identify potential problems. Should any problems arise concerning this procedure, the patient was given instructions to immediately contact us, at any time, without hesitation. In any case, we plan to contact the patient by telephone for a follow-up status report regarding this interventional procedure. Comments:  No additional relevant information.  Plan of Care  Disposition: Discharge home  Discharge Date & Time: 04/21/2017; 1205 hrs.   Physician-requested Follow-up:  Return in about 2 weeks (around 05/05/2017) for RFA procedure under fluoro and sedation: (L) L-FCT RFA.  Future Appointments Date Time Provider [place]  04/22/2017 11:30 AM [facility] None  04/24/2017 11:30 AM [facility] None  04/27/2017 11:30 AM [facility] None  04/29/2017 11:30 AM [facility] None  05/01/2017 11:30 AM [facility] None  05/04/2017 11:30 AM [facility] None  05/06/2017 11:30 AM [facility] None  05/08/2017 11:30 AM [facility] None  05/11/2017 11:30 AM [facility] None  05/13/2017 11:30 AM [facility] None  05/15/2017 11:30 AM [facility] None  05/18/2017 11:30 AM [facility] None  05/20/2017 11:30 AM [facility] None  05/22/2017 11:30 AM [facility] None  05/25/2017 11:30 AM [facility] None  05/27/2017 11:30 AM [facility] None  05/29/2017 11:30 AM [facility] None  06/01/2017 11:30 AM [facility] None  06/03/2017 11:30 AM [facility] None  06/05/2017 11:30 AM [facility] None  06/08/2017 11:30 AM [facility] None  06/10/2017 11:30 AM [facility] None  06/12/2017 11:30 AM [facility] None  10/05/2017 9:00 AM Vevelyn Francois, NP [facility] None    Imaging Orders     DG C-Arm 1-60 Min-No Report  Procedure Orders     Radiofrequency,Lumbar  Medications ordered for procedure: Meds ordered this encounter  Medications  . lactated ringers infusion 1,000 mL  . midazolam (VERSED) 5 MG/5ML injection 1-2 mg    Make sure Flumazenil is available in the pyxis when using this medication. If oversedation occurs, administer 0.2 mg IV over 15 sec. If after 45 sec no response, administer 0.2 mg again over 1 min; may repeat at 1 min intervals; not to  exceed 4 doses (1 mg)  . fentaNYL (SUBLIMAZE) injection 25-50 mcg    Make sure Narcan is available in the pyxis when using this medication. In the event of respiratory depression (RR< 8/min): Titrate NARCAN (naloxone) in increments of 0.1 to 0.2 mg IV at 2-3 minute intervals, until desired degree of reversal.  . lidocaine (XYLOCAINE) 2 % (with pres) injection 200 mg  . triamcinolone acetonide (KENALOG-40) injection 40 mg  . ropivacaine (  PF) 2 mg/mL (0.2%) (NAROPIN) injection 9 mL  . traMADol (ULTRAM) 50 MG tablet    Sig: Take 1 tablet (50 mg total) by mouth every 6 (six) hours as needed.    Dispense:  120 tablet    Refill:  5    Do not place medication on "Automatic Refill". Fill one day early if pharmacy is closed on scheduled refill date. Do not refill any sooner than every 30 days.  . cyclobenzaprine (FLEXERIL) 10 MG tablet    Sig: Take 1 tablet (10 mg total) by mouth 3 (three) times daily as needed for muscle spasms.    Dispense:  540 tablet    Refill:  0    Do not place medication on "Automatic Refill". Fill one day early if pharmacy is closed on scheduled refill date.  . gabapentin (NEURONTIN) 800 MG tablet    Sig: Take 1 tablet (800 mg total) by mouth every 8 (eight) hours.    Dispense:  540 tablet    Refill:  0    Do not place this medication, or any other prescription from our practice, on "Automatic Refill". Patient may have prescription filled one day early if pharmacy is closed on scheduled refill date.  . meloxicam (MOBIC) 15 MG tablet    Sig: Take 1 tablet (15 mg total) by mouth daily.    Dispense:  180 tablet    Refill:  0    Do not add this medication to the electronic "Automatic Refill" notification system. Patient may have prescription filled one day early if pharmacy is closed on scheduled refill date.  Marland Kitchen oxyCODONE-acetaminophen (PERCOCET) 5-325 MG tablet    Sig: Take 1 tablet by mouth every 6 (six) hours as needed for severe pain.    Dispense:  28 tablet    Refill:   0    For acute post-operative pain. Not to be refilled. To last 7 days.   Medications administered: We administered lactated ringers, midazolam, fentaNYL, lidocaine, triamcinolone acetonide, and ropivacaine (PF) 2 mg/mL (0.2%).  See the medical record for exact dosing, route, and time of administration.  New Prescriptions   OXYCODONE-ACETAMINOPHEN (PERCOCET) 5-325 MG TABLET    Take 1 tablet by mouth every 6 (six) hours as needed for severe pain.   Primary Care Physician: Marygrace Drought, MD Location: [facility] Note by: Gaspar Cola, MD Date: 04/21/2017; Time: 12:40 PM  Disclaimer:  Medicine is not an Chief Strategy Officer. The only guarantee in medicine is that nothing is guaranteed. It is important to note that the decision to proceed with this intervention was based on the information collected from the patient. The Data and conclusions were drawn from the patient's questionnaire, the interview, and the physical examination. Because the information was provided in large part by the patient, it cannot be guaranteed that it has not been purposely or unconsciously manipulated. Every effort has been made to obtain as much relevant data as possible for this evaluation. It is important to note that the conclusions that lead to this procedure are derived in large part from the available data. Always take into account that the treatment will also be dependent on availability of resources and existing treatment guidelines, considered by other Pain Management Practitioners as being common knowledge and practice, at the time of the intervention. For Medico-Legal purposes, it is also important to point out that variation in procedural techniques and pharmacological choices are the acceptable norm. The indications, contraindications, technique, and results of the above procedure  should only be interpreted and judged by a Board-Certified Interventional Pain Specialist with extensive  familiarity and expertise in the same exact procedure and technique.

## 2017-04-21 NOTE — Patient Instructions (Signed)
____________________________________________________________________________________________  Post-Procedure instructions Instructions:  Apply ice: Fill a plastic sandwich bag with crushed ice. Cover it with a small towel and apply to injection site. Apply for 15 minutes then remove x 15 minutes. Repeat sequence on day of procedure, until you go to bed. The purpose is to minimize swelling and discomfort after procedure.  Apply heat: Apply heat to procedure site starting the day following the procedure. The purpose is to treat any soreness and discomfort from the procedure.  Food intake: Start with clear liquids (like water) and advance to regular food, as tolerated.   Physical activities: Keep activities to a minimum for the first 8 hours after the procedure.   Driving: If you have received any sedation, you are not allowed to drive for 24 hours after your procedure.  Blood thinner: Restart your blood thinner 6 hours after your procedure. (Only for those taking blood thinners)  Insulin: As soon as you can eat, you may resume your normal dosing schedule. (Only for those taking insulin)  Infection prevention: Keep procedure site clean and dry.  Post-procedure Pain Diary: Extremely important that this be done correctly and accurately. Recorded information will be used to determine the next step in treatment.  Pain evaluated is that of treated area only. Do not include pain from an untreated area.  Complete every hour, on the hour, for the initial 8 hours. Set an alarm to help you do this part accurately.  Do not go to sleep and have it completed later. It will not be accurate.  Follow-up appointment: Keep your follow-up appointment after the procedure. Usually 2 weeks for most procedures. (6 weeks in the case of radiofrequency.) Bring you pain diary.  Expect:  From numbing medicine (AKA: Local Anesthetics): Numbness or decrease in pain.  Onset: Full effect within 15 minutes of  injected.  Duration: It will depend on the type of local anesthetic used. On the average, 1 to 8 hours.   From steroids: Decrease in swelling or inflammation. Once inflammation is improved, relief of the pain will follow.  Onset of benefits: Depends on the amount of swelling present. The more swelling, the longer it will take for the benefits to be seen. In some cases, up to 10 days.  Duration: Steroids will stay in the system x 2 weeks. Duration of benefits will depend on multiple posibilities including persistent irritating factors.  From procedure: Some discomfort is to be expected once the numbing medicine wears off. This should be minimal if ice and heat are applied as instructed. Call if:  You experience numbness and weakness that gets worse with time, as opposed to wearing off.  New onset bowel or bladder incontinence. (Spinal procedures only)  Emergency Numbers:  Durning business hours (Monday - Thursday, 8:00 AM - 4:00 PM) (Friday, 9:00 AM - 12:00 Noon): [phone]  After hours: [phone] ____________________________________________________________________________________________  ____________________________________________________________________________________________  Preparing for Procedure with Sedation Instructions: . Oral Intake: Do not eat or drink anything for at least 8 hours prior to your procedure. . Transportation: Public transportation is not allowed. Bring an adult driver. The driver must be physically present in our waiting room before any procedure can be started. . Physical Assistance: Bring an adult physically capable of assisting you, in the event you need help. This adult should keep you company at home for at least 6 hours after the procedure. . Blood Pressure Medicine: Take your blood pressure medicine with a sip of water the morning of the procedure. . Blood   thinners:  . Diabetics on insulin: Notify the staff so that you can be scheduled  1st case in the morning. If your diabetes requires high dose insulin, take only  of your normal insulin dose the morning of the procedure and notify the staff that you have done so. . Preventing infections: Shower with an antibacterial soap the morning of your procedure. . Build-up your immune system: Take 1000 mg of Vitamin C with every meal (3 times a day) the day prior to your procedure. . Antibiotics: Inform the staff if you have a condition or reason that requires you to take antibiotics before dental procedures. . Pregnancy: If you are pregnant, call and cancel the procedure. . Sickness: If you have a cold, fever, or any active infections, call and cancel the procedure. . Arrival: You must be in the facility at least 30 minutes prior to your scheduled procedure. . Children: Do not bring children with you. . Dress appropriately: Bring dark clothing that you would not mind if they get stained. . Valuables: Do not bring any jewelry or valuables. Procedure appointments are reserved for interventional treatments only. . No Prescription Refills. . No medication changes will be discussed during procedure appointments. . No disability issues will be discussed. ____________________________________________________________________________________________   

## 2017-04-21 NOTE — Progress Notes (Signed)
Safety precautions to be maintained throughout the outpatient stay will include: orient to surroundings, keep bed in low position, maintain call bell within reach at all times, provide assistance with transfer out of bed and ambulation.  

## 2017-04-22 ENCOUNTER — Telehealth: Payer: Self-pay | Admitting: *Deleted

## 2017-04-22 NOTE — Telephone Encounter (Signed)
Spoke with Kelly Splinter; Mr Stanley Rios and RF procedure on yesterday.  Verbalizes no questions or concerns.

## 2017-04-23 DIAGNOSIS — R600 Localized edema: Secondary | ICD-10-CM | POA: Diagnosis not present

## 2017-04-23 DIAGNOSIS — I10 Essential (primary) hypertension: Secondary | ICD-10-CM | POA: Diagnosis not present

## 2017-04-23 DIAGNOSIS — J431 Panlobular emphysema: Secondary | ICD-10-CM | POA: Diagnosis not present

## 2017-04-24 ENCOUNTER — Encounter: Payer: PPO | Admitting: *Deleted

## 2017-04-24 DIAGNOSIS — J449 Chronic obstructive pulmonary disease, unspecified: Secondary | ICD-10-CM

## 2017-04-24 NOTE — Progress Notes (Signed)
Daily Session Note  Patient Details  Name: Stanley Rios. MRN: [id number] Date of Birth: 01-16-1956 Referring Provider:     Pulmonary Rehab from 01/05/2017 in [facility] Cardiac and Pulmonary Rehab  Referring Provider  [name] MD      Encounter Date: 04/24/2017  Check In:     Session Check In - 04/24/17 1153      Check-In   Location [facility]-Cardiac & Pulmonary Rehab   Staff Present Renita Papa, RN BSN;Joseph Christain Sacramento, RN BSN   Supervising physician immediately available to respond to emergencies [name] immediately available ER MD   Physician(s) Dr. Reita Cliche and Rifenbark   Medication changes reported     No   Fall or balance concerns reported    No   Tobacco Cessation No Change   Warm-up and Cool-down Performed as group-led instruction   Resistance Training Performed Yes   VAD Patient? No     Pain Assessment   Currently in Pain? No/denies         History  Smoking Status  . Former Smoker  . Packs/day: 0.00  . Years: 40.00  . Types: Cigarettes  . Quit date: 05/28/2016  Smokeless Tobacco  . Former Systems developer    Comment: continues to use patches    Goals Met:  Proper associated with RPD/PD & O2 Sat Independence with exercise equipment Using PLB without cueing & demonstrates good technique Exercise tolerated well Strength training completed today  Goals Unmet:  Not Applicable  Comments: Pt able to follow exercise prescription today without complaint.  Will continue to monitor for progression.    Dr. Emily Filbert is Medical Director for [place] and [name].

## 2017-04-27 ENCOUNTER — Encounter: Payer: PPO | Attending: Pulmonary Disease

## 2017-04-27 DIAGNOSIS — J449 Chronic obstructive pulmonary disease, unspecified: Secondary | ICD-10-CM | POA: Insufficient documentation

## 2017-04-29 ENCOUNTER — Encounter: Payer: PPO | Admitting: *Deleted

## 2017-04-29 DIAGNOSIS — J449 Chronic obstructive pulmonary disease, unspecified: Secondary | ICD-10-CM

## 2017-04-29 NOTE — Progress Notes (Signed)
Daily Session Note  Patient Details  Name: Stanley Rios. MRN: [id number] Date of Birth: November 20, 1955 Referring Provider:     Pulmonary Rehab from 01/05/2017 in [facility] Cardiac and Pulmonary Rehab  Referring Provider  [name] MD      Encounter Date: 04/29/2017  Check In:     Session Check In - 04/29/17 1150      Check-In   Location [facility]-Cardiac & Pulmonary Rehab   Staff Present Renita Papa, RN BSN;Joseph Darrin Nipper, [place], ACSM RCEP, Exercise Physiologist   Supervising physician immediately available to respond to emergencies [name] immediately available ER MD   Physician(s) Dr. Clearnce Hasten and Jimmye Norman   Medication changes reported     No   Fall or balance concerns reported    No   Warm-up and Cool-down Performed as group-led instruction   Resistance Training Performed Yes   VAD Patient? No     Pain Assessment   Currently in Pain? No/denies         History  Smoking Status  . Former Smoker  . Packs/day: 0.00  . Years: 40.00  . Types: Cigarettes  . Quit date: 05/28/2016  Smokeless Tobacco  . Former Systems developer    Comment: continues to use patches    Goals Met:  Proper associated with RPD/PD & O2 Sat Independence with exercise equipment Using PLB without cueing & demonstrates good technique Exercise tolerated well Strength training completed today  Goals Unmet:  Not Applicable  Comments: Pt able to follow exercise prescription today without complaint.  Will continue to monitor for progression.   Dr. Emily Filbert is Medical Director for [place] and [name].

## 2017-05-01 ENCOUNTER — Encounter: Payer: PPO | Admitting: *Deleted

## 2017-05-01 DIAGNOSIS — J449 Chronic obstructive pulmonary disease, unspecified: Secondary | ICD-10-CM | POA: Diagnosis not present

## 2017-05-01 NOTE — Progress Notes (Signed)
Daily Session Note  Patient Details  Name: Stanley Rios. MRN: [id number] Date of Birth: 1956/07/14 Referring Provider:     Pulmonary Rehab from 01/05/2017 in [facility] Cardiac and Pulmonary Rehab  Referring Provider  [name] MD      Encounter Date: 05/01/2017  Check In:     Session Check In - 05/01/17 1129      Check-In   Location [facility]-Cardiac & Pulmonary Rehab   Staff Present Renita Papa, RN BSN;Joseph Darrin Nipper, [place], ACSM RCEP, Exercise Physiologist   Supervising physician immediately available to respond to emergencies [name] immediately available ER MD   Physician(s) Dr. Corky Downs and Reita Cliche   Medication changes reported     No   Fall or balance concerns reported    No   Warm-up and Cool-down Performed as group-led instruction   Resistance Training Performed Yes   VAD Patient? No     Pain Assessment   Currently in Pain? No/denies         History  Smoking Status  . Former Smoker  . Packs/day: 0.00  . Years: 40.00  . Types: Cigarettes  . Quit date: 05/28/2016  Smokeless Tobacco  . Former Systems developer    Comment: continues to use patches    Goals Met:  Proper associated with RPD/PD & O2 Sat Independence with exercise equipment Using PLB without cueing & demonstrates good technique Exercise tolerated well Strength training completed today  Goals Unmet:  Not Applicable  Comments: Pt able to follow exercise prescription today without complaint.  Will continue to monitor for progression.    Dr. Emily Filbert is Medical Director for [place] and [name].

## 2017-05-04 DIAGNOSIS — Z87891 Personal history of nicotine dependence: Secondary | ICD-10-CM | POA: Insufficient documentation

## 2017-05-04 DIAGNOSIS — J449 Chronic obstructive pulmonary disease, unspecified: Secondary | ICD-10-CM | POA: Diagnosis not present

## 2017-05-04 DIAGNOSIS — R911 Solitary pulmonary nodule: Secondary | ICD-10-CM | POA: Diagnosis not present

## 2017-05-04 DIAGNOSIS — F419 Anxiety disorder, unspecified: Secondary | ICD-10-CM | POA: Diagnosis not present

## 2017-05-04 DIAGNOSIS — J432 Centrilobular emphysema: Secondary | ICD-10-CM | POA: Diagnosis not present

## 2017-05-06 DIAGNOSIS — J449 Chronic obstructive pulmonary disease, unspecified: Secondary | ICD-10-CM

## 2017-05-06 NOTE — Progress Notes (Signed)
Daily Session Note  Patient Details  Name: Stanley Rios. MRN: [id number] Date of Birth: [birth year] Referring Provider:     Pulmonary Rehab from 01/05/2017 in [facility] Cardiac and Pulmonary Rehab  Referring Provider  [name] MD      Encounter Date: 05/06/2017  Check In:     Session Check In - 05/06/17 1122      Check-In   Location [facility]-Cardiac & Pulmonary Rehab   Staff Present Alberteen Sam, MA, ACSM RCEP, Exercise Physiologist;Zygmunt Mcglinn Alcus Dad, RN BSN   Supervising physician immediately available to respond to emergencies [name] immediately available ER MD   Physician(s) Dr. Cinda Quest and Jimmye Norman   Medication changes reported     No   Fall or balance concerns reported    No   Warm-up and Cool-down Performed as group-led instruction   Resistance Training Performed Yes   VAD Patient? No     Pain Assessment   Currently in Pain? No/denies   Multiple Pain Sites No           Exercise Prescription Changes - 05/05/17 1300      Response to Exercise   Blood Pressure (Admit) 124/80   Blood Pressure (Exit) 122/64   Heart Rate (Admit) 107 bpm   Heart Rate (Exercise) 115 bpm   Heart Rate (Exit) 115 bpm   Oxygen Saturation (Admit) 97 %   Oxygen Saturation (Exercise) 96 %   Oxygen Saturation (Exit) 97 %   Rating of Perceived Exertion (Exercise) 11   Perceived Dyspnea (Exercise) 1   Symptoms back pain   Duration Continue with 45 min of aerobic exercise without signs/symptoms of physical distress.   Intensity THRR unchanged     Progression   Progression Continue to progress workloads to maintain intensity without signs/symptoms of physical distress.   Average METs 2.65     Resistance Training   Training Prescription Yes   Weight 4 lbs   Reps 10-15     Interval Training   Interval Training No     Oxygen   Oxygen Continuous   Liters 3     NuStep   Level 6   SPM 75   Minutes 15   METs 2.8     REL-XR   Level 5   Speed 38   Minutes 15   METs 2.5     Home Exercise Plan   Plans to continue exercise at [employer] (comment)  [employer] and walking   Frequency Add 1 additional day to program exercise sessions.   Initial Home Exercises Provided 02/11/17      History  Smoking Status  . Former Smoker  . Packs/day: 0.00  . Years: 40.00  . Types: Cigarettes  . Quit date: 05/28/2016  Smokeless Tobacco  . Former Systems developer    Comment: continues to use patches    Goals Met:  Independence with exercise equipment Exercise tolerated well No report of cardiac concerns or symptoms Strength training completed today  Goals Unmet:  Not Applicable  Comments: Pt able to follow exercise prescription today without complaint.  Will continue to monitor for progression.   Dr. Emily Filbert is Medical Director for [place] and [name].

## 2017-05-08 DIAGNOSIS — J449 Chronic obstructive pulmonary disease, unspecified: Secondary | ICD-10-CM | POA: Diagnosis not present

## 2017-05-08 NOTE — Progress Notes (Signed)
Daily Session Note  Patient Details  Name: Davi Kroon. MRN: [id number] Date of Birth: [birth year] Referring Provider:     Pulmonary Rehab from 01/05/2017 in [facility] Cardiac and Pulmonary Rehab  Referring Provider  [name] MD      Encounter Date: 05/08/2017  Check In:     Session Check In - 05/08/17 1111      Check-In   Location [facility]-Cardiac & Pulmonary Rehab   Staff Present Justin Mend RCP,RRT,BSRT;Meredith Sherryll Burger, RN BSN;Jessica Luan Pulling, MA, ACSM RCEP, Exercise Physiologist   Supervising physician immediately available to respond to emergencies [name] immediately available ER MD   Physician(s) Dr. Clearnce Hasten and Cinda Quest   Medication changes reported     No   Fall or balance concerns reported    No   Warm-up and Cool-down Performed as group-led instruction   Resistance Training Performed Yes   VAD Patient? No     Pain Assessment   Currently in Pain? No/denies   Multiple Pain Sites No         History  Smoking Status  . Former Smoker  . Packs/day: 0.00  . Years: 40.00  . Types: Cigarettes  . Quit date: 05/28/2016  Smokeless Tobacco  . Former Systems developer    Comment: continues to use patches    Goals Met:  Independence with exercise equipment Exercise tolerated well No report of cardiac concerns or symptoms Strength training completed today  Goals Unmet:  Not Applicable  Comments: Pt able to follow exercise prescription today without complaint.  Will continue to monitor for progression.   Dr. Emily Filbert is Medical Director for [place] and [name].

## 2017-05-09 DIAGNOSIS — J449 Chronic obstructive pulmonary disease, unspecified: Secondary | ICD-10-CM | POA: Diagnosis not present

## 2017-05-09 DIAGNOSIS — K458 Other specified abdominal hernia without obstruction or gangrene: Secondary | ICD-10-CM | POA: Diagnosis not present

## 2017-05-09 DIAGNOSIS — J189 Pneumonia, unspecified organism: Secondary | ICD-10-CM | POA: Diagnosis not present

## 2017-05-11 DIAGNOSIS — J449 Chronic obstructive pulmonary disease, unspecified: Secondary | ICD-10-CM

## 2017-05-11 NOTE — Progress Notes (Signed)
Daily Session Note  Patient Details  Name: Stanley Rios. MRN: [id number] Date of Birth: [birth year] Referring Provider:     Pulmonary Rehab from 01/05/2017 in [facility] Cardiac and Pulmonary Rehab  Referring Provider  [name] MD      Encounter Date: 05/11/2017  Check In:     Session Check In - 05/11/17 1112      Check-In   Location [facility]-Cardiac & Pulmonary Rehab   Staff Present Earlean Shawl, BS, ACSM CEP, Exercise Physiologist;Laureen Owens Shark, BS, RRT, Respiratory Therapist;Bravery Ketcham Haslett physician immediately available to respond to emergencies [name] immediately available ER MD   Physician(s) Dr. Cherylann Banas and Corky Downs   Medication changes reported     No   Fall or balance concerns reported    No   Warm-up and Cool-down Performed as group-led instruction   Resistance Training Performed Yes   VAD Patient? No     Pain Assessment   Currently in Pain? No/denies   Multiple Pain Sites No         History  Smoking Status  . Former Smoker  . Packs/day: 0.00  . Years: 40.00  . Types: Cigarettes  . Quit date: 05/28/2016  Smokeless Tobacco  . Former Systems developer    Comment: continues to use patches    Goals Met:  Proper associated with RPD/PD & O2 Sat Independence with exercise equipment Exercise tolerated well No report of cardiac concerns or symptoms Strength training completed today  Goals Unmet:  Not Applicable  Comments: Pt able to follow exercise prescription today without complaint.  Will continue to monitor for progression.   Dr. Emily Filbert is Medical Director for [place] and [name].

## 2017-05-13 DIAGNOSIS — J449 Chronic obstructive pulmonary disease, unspecified: Secondary | ICD-10-CM | POA: Diagnosis not present

## 2017-05-13 NOTE — Progress Notes (Signed)
Daily Session Note  Patient Details  Name: Stanley Rios. MRN: [id number] Date of Birth: 1956/04/04 Referring Provider:     Pulmonary Rehab from 01/05/2017 in [facility] Cardiac and Pulmonary Rehab  Referring Provider  [name] MD      Encounter Date: 05/13/2017  Check In:     Session Check In - 05/13/17 1148      Check-In   Location [facility]-Cardiac & Pulmonary Rehab   Staff Present Gerlene Burdock, RN, Geralyn Corwin, RN BSN;Juanjose Mojica Flavia Shipper   Supervising physician immediately available to respond to emergencies [name] immediately available ER MD   Physician(s) Dr. Mariea Clonts and Joni Fears   Medication changes reported     No   Fall or balance concerns reported    No   Warm-up and Cool-down Performed as group-led instruction   Resistance Training Performed Yes   VAD Patient? No     Pain Assessment   Currently in Pain? No/denies   Multiple Pain Sites No         History  Smoking Status  . Former Smoker  . Packs/day: 0.00  . Years: 40.00  . Types: Cigarettes  . Quit date: 05/28/2016  Smokeless Tobacco  . Former Systems developer    Comment: continues to use patches    Goals Met:  Independence with exercise equipment Exercise tolerated well No report of cardiac concerns or symptoms Strength training completed today  Goals Unmet:  Not Applicable  Comments: Pt able to follow exercise prescription today without complaint.  Will continue to monitor for progression.   Dr. Emily Filbert is Medical Director for [place] and [name].

## 2017-05-15 ENCOUNTER — Encounter: Payer: PPO | Admitting: *Deleted

## 2017-05-15 DIAGNOSIS — J449 Chronic obstructive pulmonary disease, unspecified: Secondary | ICD-10-CM | POA: Diagnosis not present

## 2017-05-15 NOTE — Progress Notes (Signed)
Daily Session Note  Patient Details  Name: Stanley Rios. MRN: [id number] Date of Birth: [birth year] Referring Provider:     Pulmonary Rehab from 01/05/2017 in [facility] Cardiac and Pulmonary Rehab  Referring Provider  [name] MD      Encounter Date: 05/15/2017  Check In:     Session Check In - 05/15/17 1128      Check-In   Location [facility]-Cardiac & Pulmonary Rehab   Staff Present Renita Papa, RN BSN;Joseph Christain [place], RN BSN   Supervising physician immediately available to respond to emergencies [name] immediately available ER MD   Physician(s) Dr. Kerman Passey and Quentin Cornwall   Medication changes reported     No   Fall or balance concerns reported    No   Warm-up and Cool-down Performed as group-led instruction   Resistance Training Performed Yes   VAD Patient? No     Pain Assessment   Currently in Pain? No/denies         History  Smoking Status  . Former Smoker  . Packs/day: 0.00  . Years: 40.00  . Types: Cigarettes  . Quit date: 05/28/2016  Smokeless Tobacco  . Former Systems developer    Comment: continues to use patches    Goals Met:  Proper associated with RPD/PD & O2 Sat Independence with exercise equipment Using PLB without cueing & demonstrates good technique Exercise tolerated well Strength training completed today  Goals Unmet:  Not Applicable  Comments: Pt able to follow exercise prescription today without complaint.  Will continue to monitor for progression.    Dr. Emily Filbert is Medical Director for [place] and [name].

## 2017-05-18 VITALS — Ht 70.9 in | Wt 209.0 lb

## 2017-05-18 DIAGNOSIS — J449 Chronic obstructive pulmonary disease, unspecified: Secondary | ICD-10-CM

## 2017-05-18 NOTE — Progress Notes (Signed)
Pulmonary Individual Treatment Plan  Patient Details  Name: Stanley Rios. MRN: [id number] Date of Birth: [birth year] Referring Provider:     Pulmonary Rehab from 01/05/2017 in [facility]  Referring Provider  [name] MD      Initial Encounter Date:    Pulmonary Rehab from 01/05/2017 in [facility]  Date  01/05/17  Referring Provider  [name] MD      Visit Diagnosis: Chronic obstructive pulmonary disease, unspecified COPD type ([place])  Patient's Home Medications on Admission:  Current Outpatient Prescriptions:  .  acetaminophen (TYLENOL) 500 MG tablet, Take 1,000 mg by mouth., Disp: , Rfl:  .  albuterol (PROAIR HFA) 108 (90 Base) MCG/ACT inhaler, INHALE 2 PUFFS EVERY 4 HOURS AS NEEDED, Disp: , Rfl:  .  albuterol (PROVENTIL HFA;VENTOLIN HFA) 108 (90 Base) MCG/ACT inhaler, Inhale 2 puffs into the lungs every 4 (four) hours as needed for wheezing or shortness of breath. Reported on 10/26/2015, Disp: , Rfl:  .  albuterol (PROVENTIL HFA;VENTOLIN HFA) 108 (90 Base) MCG/ACT inhaler, Inhale into the lungs., Disp: , Rfl:  .  buPROPion (WELLBUTRIN) 75 MG tablet, Take 75 mg by mouth 2 (two) times daily. Reported on 10/26/2015, Disp: , Rfl:  .  buPROPion (WELLBUTRIN) 75 MG tablet, Take 75 mg by mouth., Disp: , Rfl:  .  Cholecalciferol (VITAMIN D3) 2000 units capsule, TK 1 C PO D, Disp: , Rfl: 0 .  clonazePAM (KLONOPIN) 0.5 MG tablet, Take 0.5 mg by mouth., Disp: , Rfl:  .  Cyanocobalamin (VITAMELTS ENERGY VITAMIN B-12) 1500 MCG TBDP, Take 1 tablet by mouth daily., Disp: 180 tablet, Rfl: 0 .  cyclobenzaprine (FLEXERIL) 10 MG tablet, Take 1 tablet (10 mg total) by mouth 3 (three) times daily as needed for muscle spasms., Disp: 540 tablet, Rfl: 0 .  diltiazem (CARDIZEM SR) 90 MG 12 hr capsule, TAKE ONE CAPSULE BY MOUTH TWICE DAILY, Disp: , Rfl:  .  DULoxetine (CYMBALTA) 60 MG capsule, Take 60 mg by mouth daily. Reported on 10/26/2015, Disp: ,  Rfl:  .  DULoxetine (CYMBALTA) 60 MG capsule, TAKE 1 CAPSULE(60 MG) BY MOUTH DAILY, Disp: , Rfl:  .  finasteride (PROSCAR) 5 MG tablet, Take 5 mg by mouth daily., Disp: , Rfl:  .  fluticasone (FLONASE) 50 MCG/ACT nasal spray, SHAKE LIQUID AND USE 2 SPRAYS IN EACH NOSTRIL DAILY, Disp: , Rfl:  .  Fluticasone-Salmeterol (ADVAIR DISKUS) 250-50 MCG/DOSE AEPB, Inhale into the lungs., Disp: , Rfl:  .  furosemide (LASIX) 20 MG tablet, Take 1 tablet (20 mg total) by mouth daily. For 1 week and then when necessary as needed for swelling and edema (Patient not taking: Reported on 04/21/2017), Disp: 30 tablet, Rfl: 0 .  furosemide (LASIX) 20 MG tablet, Take one or two daily as directed., Disp: , Rfl:  .  gabapentin (NEURONTIN) 800 MG tablet, Take by mouth., Disp: , Rfl:  .  gabapentin (NEURONTIN) 800 MG tablet, Take 1 tablet (800 mg total) by mouth every 8 (eight) hours., Disp: 540 tablet, Rfl: 0 .  ibuprofen (ADVIL,MOTRIN) 200 MG tablet, Take 400 mg by mouth., Disp: , Rfl:  .  ipratropium (ATROVENT HFA) 17 MCG/ACT inhaler, Inhale 2 puffs into the lungs every 6 (six) hours., Disp: , Rfl:  .  ipratropium-albuterol (DUONEB) 0.5-2.5 (3) MG/3ML SOLN, Take 3 mLs by nebulization every 4 (four) hours as needed., Disp: , Rfl:  .  lidocaine (XYLOCAINE) 5 % ointment, Apply 1 application topically See admin  instructions. 2 to 4 times daily, Disp: , Rfl:  .  meloxicam (MOBIC) 15 MG tablet, Take 15 mg by mouth., Disp: , Rfl:  .  meloxicam (MOBIC) 15 MG tablet, Take 1 tablet (15 mg total) by mouth daily., Disp: 180 tablet, Rfl: 0 .  metoprolol tartrate (LOPRESSOR) 25 MG tablet, Take 1 tablet (25 mg total) by mouth 2 (two) times daily., Disp: 60 tablet, Rfl: 0 .  nicotine (NICODERM CQ - DOSED IN MG/24 HOURS) 21 mg/24hr patch, Place 21 mg onto the skin daily. , Disp: , Rfl:  .  omeprazole (PRILOSEC) 40 MG capsule, Take 40 mg by mouth 2 (two) times daily., Disp: , Rfl:  .  polyethylene glycol (MIRALAX / GLYCOLAX) packet, Take  17 g by mouth daily., Disp: , Rfl:  .  polyethylene glycol powder (GLYCOLAX/MIRALAX) powder, Take by mouth., Disp: , Rfl:  .  Potassium Chloride ER 20 MEQ TBCR, Take by mouth., Disp: , Rfl:  .  potassium chloride SA (K-DUR,KLOR-CON) 20 MEQ tablet, Take by mouth., Disp: , Rfl:  .  predniSONE (DELTASONE) 10 MG tablet, Take 1 tablet by mouth once., Disp: , Rfl:  .  predniSONE (DELTASONE) 10 MG tablet, Take 10 mg by mouth., Disp: , Rfl:  .  promethazine (PHENERGAN) 25 MG tablet, Take 25 mg by mouth., Disp: , Rfl:  .  simvastatin (ZOCOR) 20 MG tablet, Take 20 mg by mouth at bedtime. , Disp: , Rfl:  .  tamsulosin (FLOMAX) 0.4 MG CAPS capsule, Take 0.4 mg by mouth daily., Disp: , Rfl:  .  tamsulosin (FLOMAX) 0.4 MG CAPS capsule, TAKE 1 CAPSULE BY MOUTH EVERY DAY, Disp: , Rfl:  .  tiotropium (SPIRIVA HANDIHALER) 18 MCG inhalation capsule, Place into inhaler and inhale., Disp: , Rfl:  .  traMADol (ULTRAM) 50 MG tablet, Take 1 tablet (50 mg total) by mouth every 6 (six) hours as needed., Disp: 120 tablet, Rfl: 5 .  traZODone (DESYREL) 50 MG tablet, Take 150 mg by mouth at bedtime., Disp: , Rfl:  .  traZODone (DESYREL) 50 MG tablet, TAKE 3 TABLETS BY MOUTH EVERY NIGHT, Disp: , Rfl:  .  Vitamin D, Ergocalciferol, (DRISDOL) 50000 units CAPS capsule, Take 1 capsule (50,000 Units total) by mouth 2 (two) times a week x 6 weeks., Disp: 12 capsule, Rfl: 0 .  zinc oxide 20 % ointment, Apply topically., Disp: , Rfl:   Past Medical History: Past Medical History:  Diagnosis Date  . Anxiety   . Chronic back pain   . Chronic neck pain   . COPD (chronic obstructive pulmonary disease) ([place])   . Depression   . Hyperlipidemia   . Hypertension   . Opiate abuse, continuous   . Pneumonia     Tobacco Use: History  Smoking Status  . Former Smoker  . Packs/day: 0.00  . Years: 40.00  . Types: Cigarettes  . Quit date: 05/28/2016  Smokeless Tobacco  . 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[employer] [facility] Latest Ref Rng & Units 01/16/2016   PHART 7.350 - 7.450 7.44   PCO2ART 32.0 - 48.0 mmHg 42   HCO3 21.0 - 28.0 mEq/L 28.5(H)   O2SAT % 88.3       Pulmonary Assessment Scores:     Pulmonary Assessment Scores    Row Name 01/05/17 1612 03/04/17 1236 05/13/17 [place]  ADL Phase Entry Mid Exit   SOB Score total 52 34 26   Rest 0 0 0   Walk 2 1 1    Stairs 3 2 2    Bath 3 4 3    Dress 0 1 1   Shop 3 2 1      mMRC Score   mMRC Score 1  -  -      Pulmonary Function Assessment:     Pulmonary Function Assessment - 01/05/17 1609      Pulmonary Function Tests   FVC% 27 %   FEV1% 27 %   FEV1/FVC Ratio 74.48     Breath   Bilateral Breath Sounds Clear   Shortness of Breath Yes;Fear of Shortness of Breath;Limiting activity      Exercise Target Goals:    Exercise Program Goal: Individual exercise prescription set with THRR, safety & activity barriers. Participant demonstrates ability to understand and report RPE using BORG scale, to self-measure pulse accurately, and to acknowledge the importance of the exercise prescription.  Exercise Prescription Goal: Starting with aerobic activity 30 plus minutes a day, 3 days per week for initial exercise prescription. Provide home exercise prescription and guidelines that participant acknowledges understanding prior to discharge.  Activity Barriers & Risk Stratification:     Activity Barriers & Cardiac Risk Stratification - 01/05/17 1605      Activity Barriers & Cardiac Risk Stratification   Activity Barriers Back Problems;Deconditioning;Muscular Weakness;Shortness of Breath;Balance Concerns      6 Minute Walk:     6 Minute Walk    Row Name 01/05/17 1601         6 Minute Walk   Phase Initial     Distance 625 feet     Walk Time 4.13 minutes     # of Rest Breaks 2  57 sec, 55 sec     MPH 1.72     METS 2.47     RPE 15      Perceived Dyspnea  4     VO2 Peak 8.63     Symptoms Yes (comment)     Comments SOB, legs cramping     Resting HR 101 bpm     Resting BP 122/64     Max Ex. HR 114 bpm     Max Ex. BP 132/64     2 Minute Post BP 128/70       Interval HR   Baseline HR (retired) 101     1 Minute HR 104     2 Minute HR 103     3 Minute HR 108     4 Minute HR 109     5 Minute HR 112     6 Minute HR 114     2 Minute Post HR 109     Interval Heart Rate? Yes       Interval Oxygen   Interval Oxygen? Yes     Baseline Oxygen Saturation % 95 %     Resting Liters of Oxygen 3 L     1 Minute Oxygen Saturation % 94 %     1 Minute Liters of Oxygen 3 L     2 Minute Oxygen Saturation % 94 %     2 Minute Liters of Oxygen 3 L     3 Minute Oxygen Saturation % 94 %     3 Minute Liters of Oxygen 3 L     4 Minute Oxygen Saturation % 96 %     4 Minute  Liters of Oxygen 3 L     5 Minute Oxygen Saturation % 95 %     5 Minute Liters of Oxygen 3 L     6 Minute Oxygen Saturation % 95 %     6 Minute Liters of Oxygen 3 L     2 Minute Post Oxygen Saturation % 96 %     2 Minute Post Liters of Oxygen 3 L       Oxygen Initial Assessment:     Oxygen Initial Assessment - 01/05/17 1616      Home Oxygen   Home Oxygen Device Home Concentrator;E-Tanks   Sleep Oxygen Prescription Continuous   Liters per minute 3   Home Exercise Oxygen Prescription Continuous   Liters per minute 3   Home at Rest Exercise Oxygen Prescription Continuous   Liters per minute 3   Compliance with Home Oxygen Use Yes     Initial 6 min Walk   Oxygen Used Continuous;E-Tanks   Liters per minute 3   Resting Oxygen Saturation  95 %   Exercise Oxygen Saturation  during 6 min walk 94 %     Program Oxygen Prescription   Program Oxygen Prescription Continuous;E-Tanks   Liters per minute 3     Intervention   Short Term Goals To learn and understand importance of monitoring SPO2 with pulse oximeter and demonstrate accurate use of the pulse  oximeter.;To learn and exhibit compliance with exercise, home and travel O2 prescription;To Learn and understand importance of maintaining oxygen saturations>88%   Long  Term Goals Exhibits compliance with exercise, home and travel O2 prescription;Maintenance of O2 saturations>88%;Verbalizes importance of monitoring SPO2 with pulse oximeter and return demonstration      Oxygen Re-Evaluation:     Oxygen Re-Evaluation    Row Name 01/14/17 1524 02/11/17 1519 03/18/17 1359 04/13/17 1155 04/29/17 1519     Program Oxygen Prescription   Program Oxygen Prescription  - Continuous;E-Tanks E-Tanks;Continuous E-Tanks;Continuous E-Tanks;Continuous   Liters per minute  - 3 3 3 3      Home Oxygen   Home Oxygen Device  - E-Tanks;Home Concentrator E-Tanks;Home Concentrator E-Tanks;Home Concentrator;Portable Concentrator Portable Concentrator;Home Concentrator;E-Tanks   Sleep Oxygen Prescription  - Continuous Continuous Continuous Continuous   Liters per minute  - 3 3 3 3    Home Exercise Oxygen Prescription  - Continuous Continuous Continuous Continuous   Liters per minute  - 4 4 4 4    Home at Rest Exercise Oxygen Prescription  - Continuous Continuous Continuous Continuous   Liters per minute  - 3 3 3 3    Compliance with Home Oxygen Use  - Yes  - Yes Yes     Goals/Expected Outcomes   Short Term Goals To learn and demonstrate proper purse lipped breathing techniques or other breathing techniques. To Learn and understand importance of maintaining oxygen saturations>88%;To learn and demonstrate proper purse lipped breathing techniques or other breathing techniques.;To learn and understand importance of monitoring SPO2 with pulse oximeter and demonstrate accurate use of the pulse oximeter.;To learn and demonstrate proper use of respiratory medications;To learn and exhibit compliance with exercise, home and travel O2 prescription To learn and exhibit compliance with exercise, home and travel O2  prescription;To learn and understand importance of monitoring SPO2 with pulse oximeter and demonstrate accurate use of the pulse oximeter.;To Learn and understand importance of maintaining oxygen saturations>88%;To learn and demonstrate proper purse lipped breathing techniques or other breathing techniques.;To learn and demonstrate proper use of respiratory medications To learn and exhibit compliance with  exercise, home and travel O2 prescription;To learn and understand importance of maintaining oxygen saturations>88%;To learn and demonstrate proper use of respiratory medications;To learn and demonstrate proper pursed lip breathing techniques or other breathing techniques.;To learn and understand importance of monitoring SPO2 with pulse oximeter and demonstrate accurate use of the pulse oximeter. To learn and exhibit compliance with exercise, home and travel O2 prescription;To learn and understand importance of maintaining oxygen saturations>88%;To learn and demonstrate proper use of respiratory medications;To learn and demonstrate proper pursed lip breathing techniques or other breathing techniques.;To learn and understand importance of monitoring SPO2 with pulse oximeter and demonstrate accurate use of the pulse oximeter.   Long  Term Goals  - Exhibits compliance with exercise, home and travel O2 prescription;Maintenance of O2 saturations>88%;Compliance with respiratory medication;Exhibits proper breathing techniques, such as purse lipped breathing or other method taught during program session;Demonstrates proper use of MDI's;Verbalizes importance of monitoring SPO2 with pulse oximeter and return demonstration Exhibits compliance with exercise, home and travel O2 prescription;Maintenance of O2 saturations>88%;Compliance with respiratory medication;Demonstrates proper use of MDI's;Exhibits proper breathing techniques, such as purse lipped breathing or other method taught during program session;Verbalizes  importance of monitoring SPO2 with pulse oximeter and return demonstration Exhibits compliance with exercise, home and travel O2 prescription;Maintenance of O2 saturations>88%;Compliance with respiratory medication;Demonstrates proper use of MDI's;Exhibits proper breathing techniques, such as pursed lip breathing or other method taught during program session;Verbalizes importance of monitoring SPO2 with pulse oximeter and return demonstration Exhibits compliance with exercise, home and travel O2 prescription;Maintenance of O2 saturations>88%;Compliance with respiratory medication;Demonstrates proper use of MDI's;Exhibits proper breathing techniques, such as pursed lip breathing or other method taught during program session;Verbalizes importance of monitoring SPO2 with pulse oximeter and return demonstration   Comments Mr. Mccard was taught how purse lip breathing is to help with his shortness of breath while exercising.  Liliane Channel has been doing well with his oxygen therapy.  He is compliant at home and while in program. He has had good O2 saturations.  He is using PLB fairly independent and has found it helpful at home too.  He needs more work on diaphragmatic breathing for relaxation and breath control.  He carries his pulse oximeter with him everywhere and checks his saturations when he is feeling low.  He has not had any problems with his medications and is doing a better job of keeping his spacer clean. Ricks exercise tolerance has gone up since starting the program. His heart rate has not dropped like it has in the past and he is getting proficient with his PLB. He is checks his oxygen saturations at home and in class and knows he needs to be 88% or above. He is using his medications properly and verbalizes understanding of how to use them. Since Liliane Channel has been absent from the [name] due to radiation therapy his oxygen saturation have been in the upper 90's. He has a new portable concentrater making it easier  for him to get around. Liliane Channel is using his PLB techniques at home and currently in class.  Liliane Channel is independent with checking his oxygen. He states his oxygen has been staying up since he got his new pulsed concentrator. He is using 4 liters on exertion. His pulse ox was giving him low readings at times but when looking at his device the batteries were getting low. He states after the batteries were changed the reading were better. His PLB has been getting better and he uses it in class. He needs to focus on  using PLB outside of [name].   Goals/Expected Outcomes Short Term: Mr. Zayed needs to be more proficient in  purse lipped breathing techniques. Long Term: Patient to be independent with purse lip breathing techniques. Short: Continue to work on breathing techniques.  Long: Continue to be independent with his oxygen therapy. Short: continue to work on PLB and monitor spo2 at home. Long: Be independent with PLB and use his respiratory medications properly. Short: Work on PLB. to improve oxygenation. Long: Titrate oxygen to 3 liters while exercising.  Short: Properly check is oxygen with his Pulse Oximeter. Long: Use Pulse oximeter independently and maintain oxygen saturation above 88 percent      Oxygen Discharge (Final Oxygen Re-Evaluation):     Oxygen Re-Evaluation - 04/29/17 1519      Program Oxygen Prescription   Program Oxygen Prescription E-Tanks;Continuous   Liters per minute 3     Home Oxygen   Home Oxygen Device Portable Concentrator;Home Concentrator;E-Tanks   Sleep Oxygen Prescription Continuous   Liters per minute 3   Home Exercise Oxygen Prescription Continuous   Liters per minute 4   Home at Rest Exercise Oxygen Prescription Continuous   Liters per minute 3   Compliance with Home Oxygen Use Yes     Goals/Expected Outcomes   Short Term Goals To learn and exhibit compliance with exercise, home and travel O2 prescription;To learn and understand importance of maintaining oxygen  saturations>88%;To learn and demonstrate proper use of respiratory medications;To learn and demonstrate proper pursed lip breathing techniques or other breathing techniques.;To learn and understand importance of monitoring SPO2 with pulse oximeter and demonstrate accurate use of the pulse oximeter.   Long  Term Goals Exhibits compliance with exercise, home and travel O2 prescription;Maintenance of O2 saturations>88%;Compliance with respiratory medication;Demonstrates proper use of MDI's;Exhibits proper breathing techniques, such as pursed lip breathing or other method taught during program session;Verbalizes importance of monitoring SPO2 with pulse oximeter and return demonstration   Comments Liliane Channel is independent with checking his oxygen. He states his oxygen has been staying up since he got his new pulsed concentrator. He is using 4 liters on exertion. His pulse ox was giving him low readings at times but when looking at his device the batteries were getting low. He states after the batteries were changed the reading were better. His PLB has been getting better and he uses it in class. He needs to focus on using PLB outside of [name].   Goals/Expected Outcomes Short: Properly check is oxygen with his Pulse Oximeter. Long: Use Pulse oximeter independently and maintain oxygen saturation above 88 percent      Initial Exercise Prescription:     Initial Exercise Prescription - 01/05/17 1600      Date of Initial Exercise RX and Referring Provider   Date 01/05/17   Referring Provider [name] MD     Oxygen   Oxygen Continuous   Liters 3     Treadmill   MPH 1.7   Grade 0   Minutes 15   METs 2.3     NuStep   Level 1   SPM 80   Minutes 15   METs 2     REL-XR   Level 1   Speed 50   Minutes 15   METs 2     Prescription Details   Frequency (times per week) 3   Duration Progress to 45 minutes of aerobic exercise without signs/symptoms of physical distress     Intensity    THRR 40-80% of  Max Heartrate 124-147   Ratings of Perceived Exertion 11-13   Perceived Dyspnea 0-4     Progression   Progression Continue to progress workloads to maintain intensity without signs/symptoms of physical distress.     Resistance Training   Training Prescription Yes   Weight 3 lbs   Reps 10-15      Perform Capillary Blood Glucose checks as needed.  Exercise Prescription Changes:     Exercise Prescription Changes    Row Name 01/14/17 1400 01/27/17 1400 02/10/17 1600 02/11/17 1500 02/24/17 1500     Response to Exercise   Blood Pressure (Admit) 142/74 130/66 116/68  - 114/60   Blood Pressure (Exercise) 142/80 128/70 124/70  - 164/78   Blood Pressure (Exit) 120/80 118/68 112/60  - 120/76   Heart Rate (Admit) 104 bpm 98 bpm 108 bpm  - 102 bpm   Heart Rate (Exercise) 118 bpm 105 bpm 108 bpm  - 119 bpm   Heart Rate (Exit) 105 bpm 102 bpm 112 bpm  - 108 bpm   Oxygen Saturation (Admit) 93 % 94 % 91 %  - 97 %   Oxygen Saturation (Exercise) 88 % 92 % 94 %  - 92 %   Oxygen Saturation (Exit) 97 % 97 % 96 %  - 98 %   Rating of Perceived Exertion (Exercise) 17 13 13   - 15   Perceived Dyspnea (Exercise) 4 2 2   - 2   Symptoms SOB, fatigued SOB, fatigued SOB, fatigued  - fatigue   Comments first full day of exercise  -  -  -  -   Duration Progress to 45 minutes of aerobic exercise without signs/symptoms of physical distress Progress to 45 minutes of aerobic exercise without signs/symptoms of physical distress Continue with 45 min of aerobic exercise without signs/symptoms of physical distress.  - Continue with 45 min of aerobic exercise without signs/symptoms of physical distress.   Intensity THRR unchanged THRR unchanged THRR unchanged  - THRR unchanged     Progression   Progression Continue to progress workloads to maintain intensity without signs/symptoms of physical distress. Continue to progress workloads to maintain intensity without signs/symptoms of physical distress.  Continue to progress workloads to maintain intensity without signs/symptoms of physical distress.  - Continue to progress workloads to maintain intensity without signs/symptoms of physical distress.   Average METs 1.49 1.69 1.76  - 2.26     Resistance Training   Training Prescription Yes Yes Yes  - Yes   Weight 3 lbs 3 lbs 3 lbs  - 4 lbs   Reps 10-15 10-15 10-15  - 10-15     Interval Training   Interval Training No No No  - No     Oxygen   Oxygen Continuous Continuous Continuous  - Continuous   Liters 3 3 3   - 3     Treadmill   MPH 1 1 1   - 1.4   Grade 0 0 0  - 0   Minutes 4  2 min, 1:40 7  3 min 2 min 2 min 15  - 15   METs 1.77 1.77 1.77  - 2.07     NuStep   Level 1 1 2   - 5   SPM 56 74 59  - 68   Minutes 15 15 15   - 15   METs 1.5 2 1.8  - 2.4     REL-XR   Level 1 1 1   - 3   Speed 30 39  -  -  45   Minutes 15 15 15   - 15   METs 1.2 1.3 1.7  - 2.3     Home Exercise Plan   Plans to continue exercise at  -  -  - [employer] (comment)  [employer] and walking [employer] (comment)  [place] and walking   Frequency  -  -  - Add 1 additional day to program exercise sessions. Add 1 additional day to program exercise sessions.   Initial Home Exercises Provided  -  -  - 02/11/17 02/11/17   Row Name 03/10/17 1500 03/25/17 1500 04/21/17 1500 05/05/17 1300       Response to Exercise   Blood Pressure (Admit) 110/64 98/62 122/70 124/80    Blood Pressure (Exercise) 145/78  -  -  -    Blood Pressure (Exit) 118/84 120/60 118/66 122/64    Heart Rate (Admit) 110 bpm 95 bpm 103 bpm 107 bpm    Heart Rate (Exercise) 119 bpm 116 bpm 108 bpm 115 bpm    Heart Rate (Exit) 104 bpm 90 bpm 100 bpm 115 bpm    Oxygen Saturation (Admit) 98 % 97 % 97 % 97 %    Oxygen Saturation (Exercise) 94 % 94 % 93 % 96 %    Oxygen Saturation (Exit) 97 % 95 % 97 % 97 %    Rating of Perceived Exertion (Exercise) 15 13 13 11     Perceived Dyspnea (Exercise) 2 1 1 1     Symptoms  fatigue and SOB on treadmill back pain back pain back pain    Duration Continue with 45 min of aerobic exercise without signs/symptoms of physical distress. Continue with 45 min of aerobic exercise without signs/symptoms of physical distress. Continue with 45 min of aerobic exercise without signs/symptoms of physical distress. Continue with 45 min of aerobic exercise without signs/symptoms of physical distress.    Intensity THRR unchanged THRR unchanged THRR unchanged THRR unchanged      Progression   Progression Continue to progress workloads to maintain intensity without signs/symptoms of physical distress. Continue to progress workloads to maintain intensity without signs/symptoms of physical distress. Continue to progress workloads to maintain intensity without signs/symptoms of physical distress. Continue to progress workloads to maintain intensity without signs/symptoms of physical distress.    Average METs 2.67 2.67 2.27 2.65      Resistance Training   Training Prescription Yes Yes Yes Yes    Weight 4 lbs 4 lbs 4 lbs 4 lbs    Reps 10-15 10-15 10-15 10-15      Interval Training   Interval Training No No No No      Oxygen   Oxygen Continuous Continuous Continuous Continuous    Liters 3 3 3 3       Treadmill   MPH 1.3  -  -  -    Grade 0  -  -  -    Minutes 15  -  -  -    METs 2  -  -  -      NuStep   Level 5 5 6 6     SPM 79 79 55 75    Minutes 15 15 15 15     METs 2.3 2.7 2.2 2.8      REL-XR   Level 5 6 5 5     Speed 45 50 37 38    Minutes 15 15 15 15     METs 3.7 3.7 2.5 2.5      Home Exercise  Plan   Plans to continue exercise at [employer] (comment)  [employer] and walking [employer] (comment)  [employer] and walking [employer] (comment)  [employer] and walking [employer] (comment)  [employer] and walking    Frequency Add 1 additional day to program exercise sessions. Add 1 additional day to program exercise  sessions. Add 1 additional day to program exercise sessions. Add 1 additional day to program exercise sessions.    Initial Home Exercises Provided 02/11/17 02/11/17 02/11/17 02/11/17       Exercise Comments:     Exercise Comments    Row Name 01/14/17 1456 01/14/17 1530         Exercise Comments First full day of exercise!  Patient was oriented to gym and equipment including functions, settings, policies, and procedures.  Patient's individual exercise prescription and treatment plan were reviewed.  All starting workloads were established based on the results of the 6 minute walk test done at initial orientation visit.  The plan for exercise progression was also introduced and progression will be customized based on patient's performance and goals. Mr. Dudzinski was exercising on the treadmill and was short of breath after 2 minutes. He did intervals of about two minutes, pulse and spO2 was checked when regaining his breath. S HR stayed was reading in the 60's but then increased into the 100's. Two sat probes were used to determine if the reading was correct. He was placed on telemetry for any drops in heart rate while exercising but now significant changes were noted.         Exercise Goals and Review:     Exercise Goals    Row Name 01/05/17 1607             Exercise Goals   Increase Physical Activity Yes       Intervention Provide advice, education, support and counseling about physical activity/exercise needs.;Develop an individualized exercise prescription for aerobic and resistive training based on initial evaluation findings, risk stratification, comorbidities and participant's personal goals.       Expected Outcomes Achievement of increased cardiorespiratory fitness and enhanced flexibility, muscular endurance and strength shown through measurements of functional capacity and personal statement of participant.       Increase Strength and Stamina Yes       Intervention Provide  advice, education, support and counseling about physical activity/exercise needs.;Develop an individualized exercise prescription for aerobic and resistive training based on initial evaluation findings, risk stratification, comorbidities and participant's personal goals.       Expected Outcomes Achievement of increased cardiorespiratory fitness and enhanced flexibility, muscular endurance and strength shown through measurements of functional capacity and personal statement of participant.          Exercise Goals Re-Evaluation :     Exercise Goals Re-Evaluation    Row Name 01/27/17 1426 02/10/17 1615 02/11/17 1503 02/24/17 1543 03/10/17 1504     Exercise Goal Re-Evaluation   Exercise Goals Review Increase Physical Activity;Increase Strenth and Stamina Increase Physical Activity;Increase Strenth and Stamina Increase Physical Activity;Increase Strenth and Stamina Increase Physical Activity;Increase Strenth and Stamina Increase Physical Activity;Increase Strenth and Stamina   Comments Demarquis is off to a good start in rehab.  He missed a week due to doctor's appointments.  He is working on increasing his time on the treadmill.  Currently he is up to 7 min total.  We will continue to monitor his progress. Liliane Channel is doing well in rehab.  He is getting stronger and  able to do more.  He is now up ot 15 minutes on the treadmill!! He has also increased the NuStep to level 4.  We will continue to monitor his progression.   Liliane Channel feels like he is improving.  He is doing his 15 min on the treadmill.  He is feeling stronger and has more stamina already.  Reviewed home exercise with pt today.  Pt plans to walk and go to [employer] for exercise.  Reviewed THR, pulse, RPE, sign and symptoms, and when to call 911 or MD.  Also discussed weather considerations and indoor options.  We also discussed how to slowly progress his home exercise.  Pt voiced understanding. Liliane Channel has continued to do well in rehab.  He is now up  to 1.4 mph on the treadmill.  He is not currently doing his home exercise until cleared by cardiology.  However, we did discuss that it also does not mean that he can just go home and sit all day.  He needs to stay active. We will continue to monitor his progression.  Liliane Channel continues to work hard in rehab.  He is already at the half way point.  He is concerned about his cancer diagnosis and how it will affect him going forward.  He has moved up to level 5 on the XR.  We will continue to monitor his progression.   Expected Outcomes Short: Continue to increase time on treadmill.  Long; Continue to attend class to work on strength and stamina. Short: Try to increase workload on XR and attend more regularly by scheduling appointments around rehab.  Long: Increase strength and stamina. Short: Add in at least extra day of exercise at home and to attend class regularly.  Long: Exercise more frequently. Short: Increase speed/spm on both XR and NuStep.  Long: Move more at home. Short: Work on bringing speed back up on treadmill.  Long: Continue to increase activity level at home.    [place] Name 03/25/17 1453 04/07/17 1523 04/21/17 1509 05/05/17 1339       Exercise Goal Re-Evaluation   Exercise Goals Review Increase Physical Activity;Increase Strength and Stamina  - Increase Physical Activity;Increase Strength and Stamina Increase Physical Activity;Increase Strength and Stamina    Comments Liliane Channel continues to do well in rehab. He continues to have problems with his back and has stayed off the treadmill.   He is doing 30 min on the NuStep instead.  He also increased his XR to level 6.  We will continue to monitor his progression. Out since last review with doctor's appointments Liliane Channel has returned to rehab and is doing well.  He is now up to level 6 on the NuStep.  He has had a few side effects from radiation, but mostly has been doing well with it.  We will continue to monitor his progression. Liliane Channel continues to do fairly well  in rehab.  He is still getting wheeled down and out by his wife.  He also has not been doing his exercise at home.  He did sit in on class last Friday and hopefully will start to move more at home.  We will continue to monitor his progress.    Expected Outcomes Short: Increase workload on NuStep and maintain spm for full 77mn.  Long: Increase activity at home.   - Short: Work on spm on [employer]  Long: Continue to try to increase activity at home Short: Walk at least one day at home.  Long: Increase physcial activity.  Discharge Exercise Prescription (Final Exercise Prescription Changes):     Exercise Prescription Changes - 05/05/17 1300      Response to Exercise   Blood Pressure (Admit) 124/80   Blood Pressure (Exit) 122/64   Heart Rate (Admit) 107 bpm   Heart Rate (Exercise) 115 bpm   Heart Rate (Exit) 115 bpm   Oxygen Saturation (Admit) 97 %   Oxygen Saturation (Exercise) 96 %   Oxygen Saturation (Exit) 97 %   Rating of Perceived Exertion (Exercise) 11   Perceived Dyspnea (Exercise) 1   Symptoms back pain   Duration Continue with 45 min of aerobic exercise without signs/symptoms of physical distress.   Intensity THRR unchanged     Progression   Progression Continue to progress workloads to maintain intensity without signs/symptoms of physical distress.   Average METs 2.65     Resistance Training   Training Prescription Yes   Weight 4 lbs   Reps 10-15     Interval Training   Interval Training No     Oxygen   Oxygen Continuous   Liters 3     NuStep   Level 6   SPM 75   Minutes 15   METs 2.8     REL-XR   Level 5   Speed 38   Minutes 15   METs 2.5     Home Exercise Plan   Plans to continue exercise at [employer] (comment)  [employer] and walking   Frequency Add 1 additional day to program exercise sessions.   Initial Home Exercises Provided 02/11/17      Nutrition:  Target Goals: Understanding of nutrition guidelines, daily intake of  sodium <1554m, cholesterol <2033m calories 30% from fat and 7% or less from saturated fats, daily to have 5 or more servings of fruits and vegetables.  Biometrics:     Pre Biometrics - 01/05/17 1607      Pre Biometrics   Height 5' 10.9" (1.801 m)   Weight 191 lb 9.6 oz (86.9 kg)   Waist Circumference 43 inches   Hip Circumference 37 inches   Waist to Hip Ratio 1.16 %   BMI (Calculated) 26.9       Nutrition Therapy Plan and Nutrition Goals:   Nutrition Discharge: Rate Your Plate Scores:   Nutrition Goals Re-Evaluation:     Nutrition Goals Re-Evaluation    Row Name 02/11/17 1526 04/13/17 1218 04/29/17 1515         Goals   Current Weight 199 lb (90.3 kg) 206 lb (93.4 kg) 203 lb (92.1 kg)     Nutrition Goal  - RiLiliane Channelants to weigh 165lbs RiLiliane Channelants to weigh 165 pounds.      Comment Scheduled appt for 7/30 at 10am with dietician.  Would like to discuss diet for weight loss.  RiLiliane Channeltates he had to cancel his last nutrition appointment.  RiLiliane Channelas lost some weight since he began exercising. He has been taking his medications as prescribed to decrease his fluid retention. Informed RiLiliane Channelo bring his paperwork and his food log so he can meet with the dietician.     Expected Outcome  - Short: Lose weight and meet with the Nutritionist. Long: Maintain weightloss after [name]. Short: Lose weight buy attending [name], meet with the Dietician. Long: maintain weightloss post [name].        Nutrition Goals Discharge (Final Nutrition Goals Re-Evaluation):     Nutrition Goals Re-Evaluation - 04/29/17 1515      Goals  Current Weight 203 lb (92.1 kg)   Nutrition Goal Liliane Channel wants to weigh 165 pounds.    Comment Liliane Channel has lost some weight since he began exercising. He has been taking his medications as prescribed to decrease his fluid retention. Informed Liliane Channel to bring his paperwork and his food log so he can meet with the dietician.   Expected Outcome Short: Lose weight buy  attending [name], meet with the Dietician. Long: maintain weightloss post [name].      Psychosocial: Target Goals: Acknowledge presence or absence of significant depression and/or stress, maximize coping skills, provide positive support system. Participant is able to verbalize types and ability to use techniques and skills needed for reducing stress and depression.   Initial Review & Psychosocial Screening:     Initial Psych Review & Screening - 01/05/17 [place]? Yes   Comments Mr Caleen Jobs has good support from his wife and one son. He states he has no depression and is looking forward to increasing his stamina and energy level through his participation in [facility].     Barriers   Psychosocial barriers to participate in program There are no identifiable barriers or psychosocial needs.;The patient should benefit from training in stress management and relaxation.     Screening Interventions   Interventions Encouraged to exercise      Quality of Life Scores:     Quality of Life - 05/13/17 1208      Quality of Life Scores   Health/Function Pre 19.81 %   Health/Function Post 12.84 %   Health/Function % Change -35.18 %   Socioeconomic Pre 19 %   Socioeconomic Post 15.86 %   Socioeconomic % Change  -16.53 %   Psych/Spiritual Pre 20.14 %   Psych/Spiritual Post 16.5 %   Psych/Spiritual % Change -18.07 %   Family Pre 20.8 %   Family Post 23.7 %   Family % Change 13.94 %   GLOBAL Pre 19.86 %   GLOBAL Post 15.73 %   GLOBAL % Change -20.8 %      PHQ-9: Recent Review Flowsheet Data    Depression screen [facility] 2/9 05/13/2017 03/04/2017 01/22/2017 01/13/2017 01/05/2017   Decreased Interest 2 2 0 1 1   Down, Depressed, Hopeless 2 1 0 3 0   PHQ - 2 Score 4 3 0 4 1   Altered sleeping 2 0 - 1 1   Tired, decreased energy 2 2 - 1 1   Change in appetite 2 2 - 0 0   Feeling bad or failure about yourself  1 2 - 1 1   Trouble concentrating 1 0 - 0  0   Moving slowly or fidgety/restless 0 1 - 0 0   Suicidal thoughts 0 0 - 0 0   PHQ-9 Score 12 10 - 7 4   Difficult doing work/chores Somewhat difficult - - Somewhat difficult -     Interpretation of Total Score  Total Score Depression Severity:  1-4 = Minimal depression, 5-9 = Mild depression, 10-14 = Moderate depression, 15-19 = Moderately severe depression, 20-27 = Severe depression   Psychosocial Evaluation and Intervention:     Psychosocial Evaluation - 01/26/17 1220      Psychosocial Evaluation & Interventions   Interventions Stress management education;Encouraged to exercise with the program and follow exercise prescription;Relaxation education   Comments Counselor met with Mr. Hauser Liliane Channel) today for initial psychosocial evaluation.  He is a 61 year old who  has been diagnosed with both COPD and Emphysema.  He also is concerned about a PET Scan for cancer screening in his lungs this Thursday.  He has a strong support system with a spouse of 104 years; a son locally and mother a step-sister who live next door.  Liliane Channel reports he has his days and nights mixed up when it comes to sleeping.  He is on medication to help with this.  He has a good appetite.  Liliane Channel reports he has a history of depression and is on medication that treats most of these symptoms, but is considering asking the Dr. to increase this.  He states his mood is typically positive.  Liliane Channel has multiple stressors in his life currently with the PET Scan this week; his own health and finances.  Liliane Channel has goals to improve his breathing; increase his mobility and ability to walk and possibly lose some weight.  Staff will be following with Liliane Channel throughout the course of this program.     Expected Outcomes Liliane Channel will benefit from consistent exercise to achieve his stated goals.  He also will benefit from meeting with the dietician to address his weight loss goal.  Liliane Channel has multiple stressors in his life so participation in the  psychoeducational components will help increase his coping strategies.  Counselor will be following with Liliane Channel.   Continue Psychosocial Services  Follow up required by counselor      Psychosocial Re-Evaluation:     Psychosocial Re-Evaluation    [place] Name 03/09/17 1233 04/20/17 1221 04/29/17 1502         Psychosocial Re-Evaluation   Current issues with Current Stress Concerns;Current Sleep Concerns;Current Psychotropic Meds;History of Depression;Current Depression  - Current Stress Concerns;Current Sleep Concerns;History of Depression;Current Depression     Comments Counselor follow up with Liliane Channel today reporting breathing better and using a little less oxygen when at home since coming in to this class.  He is also noticing his legs are stronger.  However, Liliane Channel is seeing an oncologist later this week due to a spot found on his lungs.  He was told this is 98% positive for cancer and he is not strong enough for a biopsy or surgery; so Liliane Channel is concerned to say the least.  He is also struggling with chronic back pain which is impacting his quality of life.  Counselor encouraged Liliane Channel to speak with his Dr. about possibly increasing his Cymbalta since his depression inventory indicated his symptoms for depression have increased with all that is going on in his life currently.  He agreed to do so. Counselor will follow with Liliane Channel on this. Counselor follow with Liliane Channel re: his recent cancer diagnosis on his right lung.  He has completed the radiation treatments and has experienced more tiredness as a result.  He states he will be followed in December with the Dr. Donivan Scull: this.  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  Liliane Channel has a procedure at the pain clinic tomorrow to "burn the nerves" that are causing him so much chronic pain.  He plans to return to this class after a few days -  since he will be on medication for several days.   Liliane Channel spoke with his Dr. about increasing his Cymbalta to help with his mood during all of these current stressors and he reports the  Dr. did increase this temporarily. Liliane Channel says this has been somewhat helpful.  Counselor and staff will continue to follow.  Liliane Channel states that his mother inlaw lives next door and she attributes to his stress. He states that nothing is done right to her and his life is not private as he would like it to be. Liliane Channel seems to be pretty happy when he is in class. He is much more talkitive than he was when he first started. His breathing has improved which has made his confident increase. He does not have many hobbies at the moment other than watching tv and playing with his cats.     Expected Outcomes Liliane Channel will contact his Drs office today about his mood medications to see if an increase to help with his current depressive symptoms would be possible. Liliane Channel will continue taking his medication for depression.  He will go to the pain clinic tomorrow to address his chronic pain.  He will return to this class to address his exercise goals.   Short:Continue to exercise to revlieve stress. Long: maintain exercise and hobbies to decrease stress.     Interventions  - Stress management education Encouraged to attend Pulmonary Rehabilitation for the exercise;Stress management education     Continue Psychosocial Services  Follow up required by counselor Follow up required by staff Follow up required by staff        Psychosocial Discharge (Final Psychosocial Re-Evaluation):     Psychosocial Re-Evaluation - 04/29/17 1502      Psychosocial Re-Evaluation   Current issues with Current Stress Concerns;Current Sleep Concerns;History of Depression;Current Depression   Comments Liliane Channel states that his mother inlaw lives next door and she attributes to his stress. He states that nothing is done right to her and his life is not private as he would like it  to be. Liliane Channel seems to be pretty happy when he is in class. He is much more talkitive than he was when he first started. His breathing has improved which has made his confident increase. He does not have many hobbies at the moment other than watching tv and playing with his cats.   Expected Outcomes Short:Continue to exercise to revlieve stress. Long: maintain exercise and hobbies to decrease stress.   Interventions Encouraged to attend Pulmonary Rehabilitation for the exercise;Stress management education   Continue Psychosocial Services  Follow up required by staff      Education: Education Goals: Education classes will be provided on a weekly basis, covering required topics. Participant will state understanding/return demonstration of topics presented.  Learning Barriers/Preferences:     Learning Barriers/Preferences - 01/05/17 1608      Learning Barriers/Preferences   Learning Barriers None   Learning Preferences None      Education Topics: Initial Evaluation Education: - Verbal, written and demonstration of respiratory meds, RPE/PD scales, oximetry and breathing techniques. Instruction on use of nebulizers and MDIs: cleaning and proper use, rinsing mouth with steroid doses and importance of monitoring MDI activations.   Pulmonary Rehab from 05/13/2017 in [facility] Cardiac and Pulmonary Rehab  Date  01/05/17  Educator  LB  Instruction Review Code (retired)  2- meets goals/outcomes      General Nutrition Guidelines/Fats and Fiber: -Group instruction provided by verbal, written material, models and posters to present the general guidelines for heart healthy nutrition. Gives an explanation and review of dietary fats and fiber.   Pulmonary Rehab from 05/13/2017 in [facility] Cardiac and Pulmonary Rehab  Date  05/11/17  Educator  CR  Instruction Review Code  1- Verbalizes Understanding      Controlling Sodium/Reading Food Labels: -Group verbal and written material supporting the discussion  of sodium use in heart healthy nutrition. Review and explanation with models, verbal and written materials for utilization of the food label.   Pulmonary Rehab from 05/13/2017 in [facility] Cardiac and Pulmonary Rehab  Date  03/23/17  Educator  CR  Instruction Review Code  1- Verbalizes Understanding      Exercise Physiology & Risk Factors: - Group verbal and written instruction with models to review the exercise physiology of the cardiovascular system and associated critical values. Details cardiovascular disease risk factors and the goals associated with each risk factor.   Pulmonary Rehab from 05/13/2017 in [facility] Cardiac and Pulmonary Rehab  Date  05/01/17  Educator  [facility]  Instruction Review Code  1- Verbalizes Understanding      Aerobic Exercise & Resistance Training: - Gives group verbal and written discussion on the health impact of inactivity. On the components of aerobic and resistive training programs and the benefits of this training and how to safely progress through these programs.   Pulmonary Rehab from 05/13/2017 in [facility] Cardiac and Pulmonary Rehab  Date  02/20/17  Educator  [facility]      Flexibility, Balance, General Exercise Guidelines: - Provides group verbal and written instruction on the benefits of flexibility and balance training programs. Provides general exercise guidelines with specific guidelines to those with heart or lung disease. Demonstration and skill practice provided.   Stress Management: - Provides group verbal and written instruction about the health risks of elevated stress, cause of high stress, and healthy ways to reduce stress.   Depression: - Provides group verbal and written instruction on the correlation between heart/lung disease and depressed mood, treatment options, and the stigmas associated with seeking treatment.   Pulmonary Rehab from 05/13/2017 in [facility] Cardiac and Pulmonary Rehab  Date  03/18/17  Educator  [facility]  Instruction Review Code (retired)   2- meets goals/outcomes      Exercise & Equipment Safety: - Individual verbal instruction and demonstration of equipment use and safety with use of the equipment.   Pulmonary Rehab from 05/13/2017 in [facility] Cardiac and Pulmonary Rehab  Date  01/14/17  Educator  [facility]      Infection Prevention: - Provides verbal and written material to individual with discussion of infection control including proper hand washing and proper equipment cleaning during exercise session.   Pulmonary Rehab from 05/13/2017 in [facility] Cardiac and Pulmonary Rehab  Date  01/14/17  Educator  [facility]      Falls Prevention: - Provides verbal and written material to individual with discussion of falls prevention and safety.   Pulmonary Rehab from 05/13/2017 in [facility] Cardiac and Pulmonary Rehab  Date  01/05/17  Educator  LB  Instruction Review Code (retired)  2- meets goals/outcomes      Diabetes: - Individual verbal and written instruction to review signs/symptoms of diabetes, desired ranges of glucose level fasting, after meals and with exercise. Advice that pre and post exercise glucose checks will be done for 3 sessions at entry of program.   Chronic Lung Diseases: - Group verbal and written instruction to review new updates, new respiratory medications, new advancements in procedures and treatments. Provide informative websites and "800" numbers of self-education.   Pulmonary Rehab from 05/13/2017 in [facility] Cardiac and Pulmonary Rehab  Date  04/29/17  Educator  [facility]  Instruction Review Code  1- Verbalizes Understanding  Lung Procedures: - Group verbal and written instruction to describe testing methods done to diagnose lung disease. Review the outcome of test results. Describe the treatment choices: Pulmonary Function Tests, ABGs and oximetry.   Energy Conservation: - Provide group verbal and written instruction for methods to conserve energy, plan and organize activities. Instruct on pacing techniques, use of  adaptive equipment and posture/positioning to relieve shortness of breath.   Triggers: - Group verbal and written instruction to review types of environmental controls: home humidity, furnaces, filters, dust mite/pet prevention, HEPA vacuums. To discuss weather changes, air quality and the benefits of nasal washing.   Exacerbations: - Group verbal and written instruction to provide: warning signs, infection symptoms, calling MD promptly, preventive modes, and value of vaccinations. Review: effective airway clearance, coughing and/or vibration techniques. Create an Sports administrator.   [facility]  Date  02/25/17  Educator  [facility]      Oxygen: - Individual and group verbal and written instruction on oxygen therapy. Includes supplement oxygen, available portable oxygen systems, continuous and intermittent flow rates, oxygen safety, concentrators, and Medicare reimbursement for oxygen.   Pulmonary Rehab from 05/13/2017 in [facility] Cardiac and Pulmonary Rehab  Date  01/05/17  Educator  LB      Respiratory Medications: - Group verbal and written instruction to review medications for lung disease. Drug class, frequency, complications, importance of spacers, rinsing mouth after steroid MDI's, and proper cleaning methods for nebulizers.   Pulmonary Rehab from 05/13/2017 in [facility] Cardiac and Pulmonary Rehab  Date  01/05/17  Educator  LB  Instruction Review Code (retired)  2- meets goals/outcomes      AED/CPR: - Group verbal and written instruction with the use of models to demonstrate the basic use of the AED with the basic ABC's of resuscitation.   Pulmonary Rehab from 05/13/2017 in [facility] Cardiac and Pulmonary Rehab  Date  04/17/17  Educator  CE  Instruction Review Code  1- Verbalizes Understanding      Breathing Retraining: - Provides individuals verbal and written instruction on purpose, frequency, and proper technique of diaphragmatic  breathing and pursed-lipped breathing. Applies individual practice skills.   Pulmonary Rehab from 05/13/2017 in [facility] Cardiac and Pulmonary Rehab  Date  01/05/17  Educator  LB      Anatomy and Physiology of the Lungs: - Group verbal and written instruction with the use of models to provide basic lung anatomy and physiology related to function, structure and complications of lung disease.   Anatomy & Physiology of the Heart: - Group verbal and written instruction and models provide basic cardiac anatomy and physiology, with the coronary electrical and arterial systems. Review of: AMI, Angina, Valve disease, Heart Failure, Cardiac Arrhythmia, Pacemakers, and the ICD.   Pulmonary Rehab from 05/13/2017 in [facility] Cardiac and Pulmonary Rehab  Date  03/06/17  Educator  [facility]      Heart Failure: - Group verbal and written instruction on the basics of heart failure: signs/symptoms, treatments, explanation of ejection fraction, enlarged heart and cardiomyopathy.   Pulmonary Rehab from 05/13/2017 in [facility] Cardiac and Pulmonary Rehab  Date  03/06/17  Educator  [facility]      Sleep Apnea: - Individual verbal and written instruction to review Obstructive Sleep Apnea. Review of risk factors, methods for diagnosing and types of masks and machines for OSA.   Anxiety: - Provides group, verbal and written instruction on the correlation between heart/lung disease and anxiety, treatment options, and management of  anxiety.   Relaxation: - Provides group, verbal and written instruction about the benefits of relaxation for patients with heart/lung disease. Also provides patients with examples of relaxation techniques.   Pulmonary Rehab from 05/13/2017 in [facility] Cardiac and Pulmonary Rehab  Date  05/13/17  Educator  [facility]  Instruction Review Code  1- Verbalizes Understanding      Cardiac Medications: - Group verbal and written instruction to review commonly prescribed medications for heart disease. Reviews the  medication, class of the drug, and side effects.   Know Your Numbers: -Group verbal and written instruction about important numbers in your health.  Review of Cholesterol, Blood Pressure, Diabetes, and BMI and the role they play in your overall health.   Pulmonary Rehab from 05/13/2017 in [facility] Cardiac and Pulmonary Rehab  Date  05/08/17  Educator  [facility]  Instruction Review Code  1- Verbalizes Understanding      Other: -Provides group and verbal instruction on various topics (see comments)    Knowledge Questionnaire Score:     Knowledge Questionnaire Score - 05/13/17 1159      Knowledge Questionnaire Score   Pre Score 8/10   Post Score 10/10       Core Components/Risk Factors/Patient Goals at Admission:     Personal Goals and Risk Factors at Admission - 01/05/17 1618      Core Components/Risk Factors/Patient Goals on Admission    Weight Management Yes;Weight Loss   Intervention Weight Management: Develop a combined nutrition and exercise program designed to reach desired caloric intake, while maintaining appropriate intake of nutrient and fiber, sodium and fats, and appropriate energy expenditure required for the weight goal.;Weight Management: Provide education and appropriate resources to help participant work on and attain dietary goals.   Admit Weight 191 lb 9.6 oz (86.9 kg)   Goal Weight: Short Term 186 lb (84.4 kg)   Goal Weight: Long Term 160 lb (72.6 kg)   Expected Outcomes Short Term: Continue to assess and modify interventions until short term weight is achieved;Weight Maintenance: Understanding of the daily nutrition guidelines, which includes 25-35% calories from fat, 7% or less cal from saturated fats, less than 238m cholesterol, less than 1.5gm of sodium, & 5 or more servings of fruits and vegetables daily;Weight Loss: Understanding of general recommendations for a balanced deficit meal plan, which promotes 1-2 lb weight loss per week and includes a negative energy  balance of [phone] kcal/d;Understanding recommendations for meals to include 15-35% energy as protein, 25-35% energy from fat, 35-60% energy from carbohydrates, less than 2096mof dietary cholesterol, 20-35 gm of total fiber daily;Understanding of distribution of calorie intake throughout the day with the consumption of 4-5 meals/snacks   Tobacco Cessation Yes   Number of packs per day Quit 11/17; still using nicotine patches   Improve shortness of breath with ADL's Yes   Intervention Provide education, individualized exercise plan and daily activity instruction to help decrease symptoms of SOB with activities of daily living.   Expected Outcomes Short Term: Achieves a reduction of symptoms when performing activities of daily living.   Develop more efficient breathing techniques such as purse lipped breathing and diaphragmatic breathing; and practicing self-pacing with activity Yes   Intervention Provide education, demonstration and support about specific breathing techniuqes utilized for more efficient breathing. Include techniques such as pursed lipped breathing, diaphragmatic breathing and self-pacing activity.   Expected Outcomes Short Term: Participant will be able to demonstrate and use breathing techniques as needed throughout daily activities.   Increase knowledge of  respiratory medications and ability to use respiratory devices properly  Yes  Advair, Spiriva, Atrovent, Albuterol SVN and MDI; spacer given   Intervention Provide education and demonstration as needed of appropriate use of medications, inhalers, and oxygen therapy.   Expected Outcomes Short Term: Achieves understanding of medications use. Understands that oxygen is a medication prescribed by physician. Demonstrates appropriate use of inhaler and oxygen therapy.   Hypertension Yes   Intervention Provide education on lifestyle modifcations including regular physical activity/exercise, weight management, moderate sodium restriction  and increased consumption of fresh fruit, vegetables, and low fat dairy, alcohol moderation, and smoking cessation.;Monitor prescription use compliance.   Expected Outcomes Short Term: Continued assessment and intervention until BP is < 140/27m HG in hypertensive participants. < 130/844mHG in hypertensive participants with diabetes, heart failure or chronic kidney disease.;Long Term: Maintenance of blood pressure at goal levels.      Core Components/Risk Factors/Patient Goals Review:      Goals and Risk Factor Review    Row Name 02/11/17 1508 03/04/17 1221 04/13/17 1203 04/29/17 1529       Core Components/Risk Factors/Patient Goals Review   Personal Goals Review Hypertension;Tobacco Cessation;Weight Management/Obesity;Improve shortness of breath with ADL's Weight Management/Obesity;Increase knowledge of respiratory medications and ability to use respiratory devices properly.;Develop more efficient breathing techniques such as purse lipped breathing and diaphragmatic breathing and practicing self-pacing with activity.;Improve shortness of breath with ADL's;Stress Weight Management/Obesity;Stress;Increase knowledge of respiratory medications and ability to use respiratory devices properly. Weight Management/Obesity;Stress;Hypertension    Review RiLiliane Channelas been doing well in rehab.  He is feeling stronger and already has more stamina as well.   He is able to do more at home with less SOB.  However, his weight is starting to trend up.  He has quit smoking completely and stopped using the patches a week ago!!  He is trying to eat better, but continues to see weight gain.  He did mention that his stomach is bothersome with both swelling and acid.  He also has lots of issues with consitpation with his hernias.  We discussed meeting with dietician and made him an appointment that his wife can also attend.  Rick checks his blood pressure regularly at home. He has not been recording his numbers but plans to  start tracking to take with him to appointment with cardiologist on 8/3.   RiLiliane Channeleported feeling better since beginning classes. He does express concerns about his wife continuing to smoke, but she has no plans to quit around him. RiLiliane Channels continuing to practice pursed lip breathing and using his medications correctly. His main concern is his pain when he walks, and he is in line to see a doctor at the pain clinic to help with this. His cardiologist told him he has an irregular heart beat and to rest if it starts to beat too slow or too fast.  His appointment with the nutritionist had to be cancelled, but he reported that he would let the staff know when he is ready to make another appointment as he still wishes to have help changing his eating habits.  RiLiliane Channels back to [place] after being out for radiation therapy. He has been maintaining his weight since he has been out. His stress levels have been down since he obtained a portable concentrator. He plans to keep attending [name] since his next radiation appointment is in December. He is using his nebulizer and inhalers as prescribed. Ricks blood pressure has been improving since the start  of the program. Liliane Channel states he is eating too much. His stress levels are up because of his mother inlaw who lives next door.     Expected Outcomes Short: Attend nutrition appointment and work on weight.  Long: Continue to work on risk factors modifications. Short: he wants to meet with the nutrionist soon when it is better timing for him and his wife; Long: he wants to get in with the pain clinic to manage pain control Short: Work on losing weight and decrease stress levels. Long: Maintain Weightloss and keep stress to a minimum after [name]. Short: Work on weight loss, and decrease stress levels. Long: Maintain a stress free environment and maintain adequate blood pressures.       Core Components/Risk Factors/Patient Goals at Discharge (Final Review):       Goals and Risk Factor Review - 04/29/17 1529      Core Components/Risk Factors/Patient Goals Review   Personal Goals Review Weight Management/Obesity;Stress;Hypertension   Review Ricks blood pressure has been improving since the start of the program. Liliane Channel states he is eating too much. His stress levels are up because of his mother inlaw who lives next door.    Expected Outcomes Short: Work on weight loss, and decrease stress levels. Long: Maintain a stress free environment and maintain adequate blood pressures.      ITP Comments:     ITP Comments    Row Name 01/26/17 7290 02/13/17 1140 02/23/17 0911 02/23/17 1545 03/18/17 1353   ITP Comments 30 day note review with Dr Emily Filbert, Medical Director of [place] Patient attended education "Know Your Numbers" 30 day review completed ITP sent to Dr. Ramonita Lab for Dr. Emily Filbert Director of [place]. Continue with ITP unless changes are made by physician.  Spoke with Liliane Channel and the effects of second hand smoke that his wife may be causing. He stated that she wants to quit but has not tried yet. Informed patient that his health is at risk with second hand smoke. He is going to ask her to at least not smoke in the car before class. Liliane Channel will be out September 5th,7th,10th and 12th due to radiation therapy for a Small mass in his lung. He is going to try to attend if he is feeling up to it.   [place] Name 03/23/17 0845 04/07/17 1521 04/13/17 1226 04/20/17 0843 04/20/17 1226   ITP Comments 30 day review completed. ITP sent to Dr. Emily Filbert Director of [place]. Continue with ITP unless changes are made by physician.   Liliane Channel has been out since 03/23/17.  He is undergoing multiple appointments for his possible lung cancer.  He is scheduled to return on 9/17. Liliane Channel is back to [place] after his radiation therapy. He is doing very well with his exercises considering he has been out. His stress levels have gone down since he obtained a portable oxygen concentrator.   30 day review completed. ITP sent to Dr. Emily Filbert Director of [place]. Continue with ITP unless changes are made by physician.   Liliane Channel has obtained a Marine scientist that has a 34 day trail. He was checking his pulse oximeter at home while on the pulsed machine and it was 87 percent. The batteries needed changed after looking at his device. Informed him to message his doctor about whether he needs continuous flow or pulsed.    [place] Name 05/18/17 0841           ITP Comments 30 day review completed. ITP sent to Dr.  Emily Filbert Director of [place]. Continue with ITP unless changes are made by physician.            Comments: 30 day review

## 2017-05-18 NOTE — Progress Notes (Signed)
Daily Session Note  Patient Details  Name: Stanley Rios. MRN: [id number] Date of Birth: 1955-08-07 Referring Provider:     Pulmonary Rehab from 01/05/2017 in [facility] Cardiac and Pulmonary Rehab  Referring Provider  Hessie Dibble MD      Encounter Date: 05/18/2017  Check In:     Session Check In - 05/18/17 1144      Check-In   Location [facility]-Cardiac & Pulmonary Rehab   Staff Present Roney Jaffe, BA, ACSM CEP, Exercise Physiologist;Kelly Madilyn Fireman, BS, ACSM CEP, Exercise Physiologist;Robinson Brinkley Hollace Kinnier   Supervising physician immediately available to respond to emergencies [name] immediately available ER MD   Physician(s) Dr. Lenard Lance and Sharma Covert   Medication changes reported     No   Fall or balance concerns reported    No   Warm-up and Cool-down Performed as group-led instruction   Resistance Training Performed Yes   VAD Patient? No     Pain Assessment   Currently in Pain? No/denies   Multiple Pain Sites No         History  Smoking Status  . Former Smoker  . Packs/day: 0.00  . Years: 40.00  . Types: Cigarettes  . Quit date: 05/28/2016  Smokeless Tobacco  . Former Neurosurgeon    Comment: continues to use patches    Goals Met:  Independence with exercise equipment Exercise tolerated well No report of cardiac concerns or symptoms Strength training completed today  Goals Unmet:  Not Applicable  Comments: Pt able to follow exercise prescription today without complaint.  Will continue to monitor for progression.      6 Minute Walk    Row Name 01/05/17 1601 05/18/17 1254       6 Minute Walk   Phase Initial Discharge    Distance 625 feet 815 feet    Distance % Change  - 30 %    Distance Feet Change  - 190 ft    Walk Time 4.13 minutes 6 minutes    # of Rest Breaks 2  57 sec, 55 sec 0    MPH 1.72 1.54    METS 2.47 3.09    RPE 15 13    Perceived Dyspnea  4 2    VO2 Peak 8.63 10.82    Symptoms Yes (comment) No    Comments SOB, legs cramping  -     Resting HR 101 bpm 102 bpm    Resting BP 122/64 102/88    Resting Oxygen Saturation   - 96 %    Max Ex. HR 114 bpm 141 bpm    Max Ex. BP 132/64 150/80    2 Minute Post BP 128/70 130/70      Interval HR   Baseline HR (retired) 101  -    1 Minute HR 104 112    2 Minute HR 103 114    3 Minute HR 108 128    4 Minute HR 109 139    5 Minute HR 112 141    6 Minute HR 114 140    2 Minute Post HR 109 104    Interval Heart Rate? Yes Yes      Interval Oxygen   Interval Oxygen? Yes Yes    Baseline Oxygen Saturation % 95 % 96 %    Resting Liters of Oxygen 3 L  -    1 Minute Oxygen Saturation % 94 % 95 %    1 Minute Liters of Oxygen 3 L 4 L    2  Minute Oxygen Saturation % 94 % 95 %    2 Minute Liters of Oxygen 3 L 4 L    3 Minute Oxygen Saturation % 94 % 94 %    3 Minute Liters of Oxygen 3 L 4 L    4 Minute Oxygen Saturation % 96 % 93 %    4 Minute Liters of Oxygen 3 L 4 L    5 Minute Oxygen Saturation % 95 % 90 %    5 Minute Liters of Oxygen 3 L 4 L    6 Minute Oxygen Saturation % 95 % 92 %    6 Minute Liters of Oxygen 3 L 4 L    2 Minute Post Oxygen Saturation % 96 % 97 %    2 Minute Post Liters of Oxygen 3 L 4 L         Dr. Emily Filbert is Medical Director for [place] and [name].

## 2017-05-20 DIAGNOSIS — J449 Chronic obstructive pulmonary disease, unspecified: Secondary | ICD-10-CM

## 2017-05-20 NOTE — Progress Notes (Signed)
Daily Session Note  Patient Details  Name: Stanley Rios. MRN: [id number] Date of Birth: 1956-06-11 Referring Provider:     Pulmonary Rehab from 01/05/2017 in [facility] Cardiac and Pulmonary Rehab  Referring Provider  [name] MD      Encounter Date: 05/20/2017  Check In:     Session Check In - 05/20/17 1144      Check-In   Location [facility]-Cardiac & Pulmonary Rehab   Staff Present Alberteen Sam, MA, ACSM RCEP, Exercise Physiologist;Meredith Sherryll Burger, RN BSN;Aarohi Redditt Flavia Shipper   Supervising physician immediately available to respond to emergencies [name] immediately available ER MD   Physician(s) Dr. Jimmye Norman and Clearnce Hasten   Medication changes reported     No   Fall or balance concerns reported    No   Warm-up and Cool-down Performed as group-led instruction   Resistance Training Performed Yes   VAD Patient? No     Pain Assessment   Currently in Pain? No/denies         History  Smoking Status  . Former Smoker  . Packs/day: 0.00  . Years: 40.00  . Types: Cigarettes  . Quit date: 05/28/2016  Smokeless Tobacco  . Former Systems developer    Comment: continues to use patches    Goals Met:  Proper associated with RPD/PD & O2 Sat Independence with exercise equipment Using PLB without cueing & demonstrates good technique Exercise tolerated well Strength training completed today  Goals Unmet:  Not Applicable  Comments: Pt able to follow exercise prescription today without complaint.  Will continue to monitor for progression.    Dr. Emily Filbert is Medical Director for [place] and [name].

## 2017-05-20 NOTE — Patient Instructions (Signed)
Discharge Instructions  Patient Details  Name: Stanley Rios. MRN: [id number] Date of Birth: 1956-02-12 Referring Provider:  Delfino Lovett, MD   Number of Visits: 36/36  Reason for Discharge:  Patient reached a stable level of exercise. Patient has met program and personal goals.  Smoking History:  History  Smoking Status  . Former Smoker  . Packs/day: 0.00  . Years: 40.00  . Types: Cigarettes  . Quit date: 05/28/2016  Smokeless Tobacco  . Former Systems developer    Comment: continues to use patches    Diagnosis:  Chronic obstructive pulmonary disease, unspecified COPD type ([place])  Initial Exercise Prescription:     Initial Exercise Prescription - 01/05/17 1600      Date of Initial Exercise RX and Referring Provider   Date 01/05/17   Referring Provider [name] MD     Oxygen   Oxygen Continuous   Liters 3     Treadmill   MPH 1.7   Grade 0   Minutes 15   METs 2.3     NuStep   Level 1   SPM 80   Minutes 15   METs 2     REL-XR   Level 1   Speed 50   Minutes 15   METs 2     Prescription Details   Frequency (times per week) 3   Duration Progress to 45 minutes of aerobic exercise without signs/symptoms of physical distress     Intensity   THRR 40-80% of Max Heartrate 124-147   Ratings of Perceived Exertion 11-13   Perceived Dyspnea 0-4     Progression   Progression Continue to progress workloads to maintain intensity without signs/symptoms of physical distress.     Resistance Training   Training Prescription Yes   Weight 3 lbs   Reps 10-15      Discharge Exercise Prescription (Final Exercise Prescription Changes):     Exercise Prescription Changes - 05/20/17 1500      Response to Exercise   Blood Pressure (Admit) 148/68   Blood Pressure (Exit) 124/64   Heart Rate (Admit) 110 bpm   Heart Rate (Exercise) 120 bpm   Heart Rate (Exit) 116 bpm   Oxygen Saturation (Admit) 99 %   Oxygen Saturation (Exercise) 94 %   Oxygen Saturation  (Exit) 95 %   Rating of Perceived Exertion (Exercise) 12   Perceived Dyspnea (Exercise) 1   Symptoms back pain   Duration Continue with 45 min of aerobic exercise without signs/symptoms of physical distress.   Intensity THRR unchanged     Progression   Progression Continue to progress workloads to maintain intensity without signs/symptoms of physical distress.   Average METs 3.27     Resistance Training   Training Prescription Yes   Weight 5 lbs   Reps 10-15     Interval Training   Interval Training No     Oxygen   Oxygen Continuous   Liters 4     NuStep   Level 5   SPM 77   Minutes 15   METs 2.9     REL-XR   Level 6   Speed 48   Minutes 15   METs 4.1     Home Exercise Plan   Plans to continue exercise at [employer] (comment)  [employer] and walking   Frequency Add 1 additional day to program exercise sessions.   Initial Home Exercises Provided 02/11/17      Functional Capacity:     6  Minute Walk    Row Name 01/05/17 1601 05/18/17 1254       6 Minute Walk   Phase Initial Discharge    Distance 625 feet 815 feet    Distance % Change  - 30 %    Distance Feet Change  - 190 ft    Walk Time 4.13 minutes 6 minutes    # of Rest Breaks 2  57 sec, 55 sec 0    MPH 1.72 1.54    METS 2.47 3.09    RPE 15 13    Perceived Dyspnea  4 2    VO2 Peak 8.63 10.82    Symptoms Yes (comment) No    Comments SOB, legs cramping  -    Resting HR 101 bpm 102 bpm    Resting BP 122/64 102/88    Resting Oxygen Saturation   - 96 %    Max Ex. HR 114 bpm 141 bpm    Max Ex. BP 132/64 150/80    2 Minute Post BP 128/70 130/70      Interval HR   Baseline HR (retired) 101  -    1 Minute HR 104 112    2 Minute HR 103 114    3 Minute HR 108 128    4 Minute HR 109 139    5 Minute HR 112 141    6 Minute HR 114 140    2 Minute Post HR 109 104    Interval Heart Rate? Yes Yes      Interval Oxygen   Interval Oxygen? Yes Yes    Baseline Oxygen Saturation % 95 % 96 %     Resting Liters of Oxygen 3 L  -    1 Minute Oxygen Saturation % 94 % 95 %    1 Minute Liters of Oxygen 3 L 4 L    2 Minute Oxygen Saturation % 94 % 95 %    2 Minute Liters of Oxygen 3 L 4 L    3 Minute Oxygen Saturation % 94 % 94 %    3 Minute Liters of Oxygen 3 L 4 L    4 Minute Oxygen Saturation % 96 % 93 %    4 Minute Liters of Oxygen 3 L 4 L    5 Minute Oxygen Saturation % 95 % 90 %    5 Minute Liters of Oxygen 3 L 4 L    6 Minute Oxygen Saturation % 95 % 92 %    6 Minute Liters of Oxygen 3 L 4 L    2 Minute Post Oxygen Saturation % 96 % 97 %    2 Minute Post Liters of Oxygen 3 L 4 L       Quality of Life:     Quality of Life - 05/13/17 1208      Quality of Life Scores   Health/Function Pre 19.81 %   Health/Function Post 12.84 %   Health/Function % Change -35.18 %   Socioeconomic Pre 19 %   Socioeconomic Post 15.86 %   Socioeconomic % Change  -16.53 %   Psych/Spiritual Pre 20.14 %   Psych/Spiritual Post 16.5 %   Psych/Spiritual % Change -18.07 %   Family Pre 20.8 %   Family Post 23.7 %   Family % Change 13.94 %   GLOBAL Pre 19.86 %   GLOBAL Post 15.73 %   GLOBAL % Change -20.8 %      Personal Goals: Goals established at orientation  with interventions provided to work toward goal.     Personal Goals and Risk Factors at Admission - 01/05/17 1618      Core Components/Risk Factors/Patient Goals on Admission    Weight Management Yes;Weight Loss   Intervention Weight Management: Develop a combined nutrition and exercise program designed to reach desired caloric intake, while maintaining appropriate intake of nutrient and fiber, sodium and fats, and appropriate energy expenditure required for the weight goal.;Weight Management: Provide education and appropriate resources to help participant work on and attain dietary goals.   Admit Weight 191 lb 9.6 oz (86.9 kg)   Goal Weight: Short Term 186 lb (84.4 kg)   Goal Weight: Long Term 160 lb (72.6 kg)   Expected  Outcomes Short Term: Continue to assess and modify interventions until short term weight is achieved;Weight Maintenance: Understanding of the daily nutrition guidelines, which includes 25-35% calories from fat, 7% or less cal from saturated fats, less than 229m cholesterol, less than 1.5gm of sodium, & 5 or more servings of fruits and vegetables daily;Weight Loss: Understanding of general recommendations for a balanced deficit meal plan, which promotes 1-2 lb weight loss per week and includes a negative energy balance of [phone] kcal/d;Understanding recommendations for meals to include 15-35% energy as protein, 25-35% energy from fat, 35-60% energy from carbohydrates, less than 2041mof dietary cholesterol, 20-35 gm of total fiber daily;Understanding of distribution of calorie intake throughout the day with the consumption of 4-5 meals/snacks   Tobacco Cessation Yes   Number of packs per day Quit 11/17; still using nicotine patches   Improve shortness of breath with ADL's Yes   Intervention Provide education, individualized exercise plan and daily activity instruction to help decrease symptoms of SOB with activities of daily living.   Expected Outcomes Short Term: Achieves a reduction of symptoms when performing activities of daily living.   Develop more efficient breathing techniques such as purse lipped breathing and diaphragmatic breathing; and practicing self-pacing with activity Yes   Intervention Provide education, demonstration and support about specific breathing techniuqes utilized for more efficient breathing. Include techniques such as pursed lipped breathing, diaphragmatic breathing and self-pacing activity.   Expected Outcomes Short Term: Participant will be able to demonstrate and use breathing techniques as needed throughout daily activities.   Increase knowledge of respiratory medications and ability to use respiratory devices properly  Yes  Advair, Spiriva, Atrovent, Albuterol SVN and  MDI; spacer given   Intervention Provide education and demonstration as needed of appropriate use of medications, inhalers, and oxygen therapy.   Expected Outcomes Short Term: Achieves understanding of medications use. Understands that oxygen is a medication prescribed by physician. Demonstrates appropriate use of inhaler and oxygen therapy.   Hypertension Yes   Intervention Provide education on lifestyle modifcations including regular physical activity/exercise, weight management, moderate sodium restriction and increased consumption of fresh fruit, vegetables, and low fat dairy, alcohol moderation, and smoking cessation.;Monitor prescription use compliance.   Expected Outcomes Short Term: Continued assessment and intervention until BP is < 140/9073mG in hypertensive participants. < 130/45m65m in hypertensive participants with diabetes, heart failure or chronic kidney disease.;Long Term: Maintenance of blood pressure at goal levels.       Personal Goals Discharge:     Goals and Risk Factor Review - 04/29/17 1529      Core Components/Risk Factors/Patient Goals Review   Personal Goals Review Weight Management/Obesity;Stress;Hypertension   Review Ricks blood pressure has been improving since the start of the program. RickLiliane Channeltes he is  eating too much. His stress levels are up because of his mother inlaw who lives next door.    Expected Outcomes Short: Work on weight loss, and decrease stress levels. Long: Maintain a stress free environment and maintain adequate blood pressures.      Exercise Goals and Review:     Exercise Goals    Row Name 01/05/17 1607             Exercise Goals   Increase Physical Activity Yes       Intervention Provide advice, education, support and counseling about physical activity/exercise needs.;Develop an individualized exercise prescription for aerobic and resistive training based on initial evaluation findings, risk stratification, comorbidities and  participant's personal goals.       Expected Outcomes Achievement of increased cardiorespiratory fitness and enhanced flexibility, muscular endurance and strength shown through measurements of functional capacity and personal statement of participant.       Increase Strength and Stamina Yes       Intervention Provide advice, education, support and counseling about physical activity/exercise needs.;Develop an individualized exercise prescription for aerobic and resistive training based on initial evaluation findings, risk stratification, comorbidities and participant's personal goals.       Expected Outcomes Achievement of increased cardiorespiratory fitness and enhanced flexibility, muscular endurance and strength shown through measurements of functional capacity and personal statement of participant.          Nutrition & Weight - Outcomes:     Pre Biometrics - 01/05/17 1607      Pre Biometrics   Height 5' 10.9" (1.801 m)   Weight 191 lb 9.6 oz (86.9 kg)   Waist Circumference 43 inches   Hip Circumference 37 inches   Waist to Hip Ratio 1.16 %   BMI (Calculated) 26.9         Post Biometrics - 05/18/17 1300       Post  Biometrics   Height 5' 10.9" (1.801 m)   Weight 209 lb (94.8 kg)   Waist Circumference 42.5 inches   Hip Circumference 38 inches   Waist to Hip Ratio 1.12 %   BMI (Calculated) 29.23      Nutrition:   Nutrition Discharge:   Education Questionnaire Score:     Knowledge Questionnaire Score - 05/13/17 1159      Knowledge Questionnaire Score   Pre Score 8/10   Post Score 10/10      Goals reviewed with patient; copy given to patient.

## 2017-05-22 DIAGNOSIS — J449 Chronic obstructive pulmonary disease, unspecified: Secondary | ICD-10-CM | POA: Diagnosis not present

## 2017-05-22 NOTE — Progress Notes (Signed)
Pulmonary Individual Treatment Plan  Patient Details  Name: Quinn Quam. MRN: [id number] Date of Birth: 31-Mar-1956 Referring Provider:     Pulmonary Rehab from 01/05/2017 in [facility] and [facility]  Referring Provider  [name] MD      Initial Encounter Date:    Pulmonary Rehab from 01/05/2017 in [facility]  Date  01/05/17  Referring Provider  [name] MD      Visit Diagnosis: Chronic obstructive pulmonary disease, unspecified COPD type ([place])  Patient's Home Medications on Admission:  Current Outpatient Prescriptions:  .  acetaminophen (TYLENOL) 500 MG tablet, Take 1,000 mg by mouth., Disp: , Rfl:  .  albuterol (PROAIR HFA) 108 (90 Base) MCG/ACT inhaler, INHALE 2 PUFFS EVERY 4 HOURS AS NEEDED, Disp: , Rfl:  .  albuterol (PROVENTIL HFA;VENTOLIN HFA) 108 (90 Base) MCG/ACT inhaler, Inhale 2 puffs into the lungs every 4 (four) hours as needed for wheezing or shortness of breath. Reported on 10/26/2015, Disp: , Rfl:  .  albuterol (PROVENTIL HFA;VENTOLIN HFA) 108 (90 Base) MCG/ACT inhaler, Inhale into the lungs., Disp: , Rfl:  .  buPROPion (WELLBUTRIN) 75 MG tablet, Take 75 mg by mouth 2 (two) times daily. Reported on 10/26/2015, Disp: , Rfl:  .  buPROPion (WELLBUTRIN) 75 MG tablet, Take 75 mg by mouth., Disp: , Rfl:  .  Cholecalciferol (VITAMIN D3) 2000 units capsule, TK 1 C PO D, Disp: , Rfl: 0 .  clonazePAM (KLONOPIN) 0.5 MG tablet, Take 0.5 mg by mouth., Disp: , Rfl:  .  Cyanocobalamin (VITAMELTS ENERGY VITAMIN B-12) 1500 MCG TBDP, Take 1 tablet by mouth daily., Disp: 180 tablet, Rfl: 0 .  cyclobenzaprine (FLEXERIL) 10 MG tablet, Take 1 tablet (10 mg total) by mouth 3 (three) times daily as needed for muscle spasms., Disp: 540 tablet, Rfl: 0 .  diltiazem (CARDIZEM SR) 90 MG 12 hr capsule, TAKE ONE CAPSULE BY MOUTH TWICE DAILY, Disp: , Rfl:  .  DULoxetine (CYMBALTA) 60 MG capsule, Take 60 mg by mouth daily. Reported on 10/26/2015, Disp: ,  Rfl:  .  DULoxetine (CYMBALTA) 60 MG capsule, TAKE 1 CAPSULE(60 MG) BY MOUTH DAILY, Disp: , Rfl:  .  finasteride (PROSCAR) 5 MG tablet, Take 5 mg by mouth daily., Disp: , Rfl:  .  fluticasone (FLONASE) 50 MCG/ACT nasal spray, SHAKE LIQUID AND USE 2 SPRAYS IN EACH NOSTRIL DAILY, Disp: , Rfl:  .  Fluticasone-Salmeterol (ADVAIR DISKUS) 250-50 MCG/DOSE AEPB, Inhale into the lungs., Disp: , Rfl:  .  furosemide (LASIX) 20 MG tablet, Take 1 tablet (20 mg total) by mouth daily. For 1 week and then when necessary as needed for swelling and edema (Patient not taking: Reported on 04/21/2017), Disp: 30 tablet, Rfl: 0 .  furosemide (LASIX) 20 MG tablet, Take one or two daily as directed., Disp: , Rfl:  .  gabapentin (NEURONTIN) 800 MG tablet, Take by mouth., Disp: , Rfl:  .  gabapentin (NEURONTIN) 800 MG tablet, Take 1 tablet (800 mg total) by mouth every 8 (eight) hours., Disp: 540 tablet, Rfl: 0 .  ibuprofen (ADVIL,MOTRIN) 200 MG tablet, Take 400 mg by mouth., Disp: , Rfl:  .  ipratropium (ATROVENT HFA) 17 MCG/ACT inhaler, Inhale 2 puffs into the lungs every 6 (six) hours., Disp: , Rfl:  .  ipratropium-albuterol (DUONEB) 0.5-2.5 (3) MG/3ML SOLN, Take 3 mLs by nebulization every 4 (four) hours as needed., Disp: , Rfl:  .  lidocaine (XYLOCAINE) 5 % ointment, Apply 1 application topically See admin  instructions. 2 to 4 times daily, Disp: , Rfl:  .  meloxicam (MOBIC) 15 MG tablet, Take 15 mg by mouth., Disp: , Rfl:  .  meloxicam (MOBIC) 15 MG tablet, Take 1 tablet (15 mg total) by mouth daily., Disp: 180 tablet, Rfl: 0 .  metoprolol tartrate (LOPRESSOR) 25 MG tablet, Take 1 tablet (25 mg total) by mouth 2 (two) times daily., Disp: 60 tablet, Rfl: 0 .  nicotine (NICODERM CQ - DOSED IN MG/24 HOURS) 21 mg/24hr patch, Place 21 mg onto the skin daily. , Disp: , Rfl:  .  omeprazole (PRILOSEC) 40 MG capsule, Take 40 mg by mouth 2 (two) times daily., Disp: , Rfl:  .  polyethylene glycol (MIRALAX / GLYCOLAX) packet, Take  17 g by mouth daily., Disp: , Rfl:  .  polyethylene glycol powder (GLYCOLAX/MIRALAX) powder, Take by mouth., Disp: , Rfl:  .  Potassium Chloride ER 20 MEQ TBCR, Take by mouth., Disp: , Rfl:  .  potassium chloride SA (K-DUR,KLOR-CON) 20 MEQ tablet, Take by mouth., Disp: , Rfl:  .  predniSONE (DELTASONE) 10 MG tablet, Take 1 tablet by mouth once., Disp: , Rfl:  .  predniSONE (DELTASONE) 10 MG tablet, Take 10 mg by mouth., Disp: , Rfl:  .  promethazine (PHENERGAN) 25 MG tablet, Take 25 mg by mouth., Disp: , Rfl:  .  simvastatin (ZOCOR) 20 MG tablet, Take 20 mg by mouth at bedtime. , Disp: , Rfl:  .  tamsulosin (FLOMAX) 0.4 MG CAPS capsule, Take 0.4 mg by mouth daily., Disp: , Rfl:  .  tamsulosin (FLOMAX) 0.4 MG CAPS capsule, TAKE 1 CAPSULE BY MOUTH EVERY DAY, Disp: , Rfl:  .  tiotropium (SPIRIVA HANDIHALER) 18 MCG inhalation capsule, Place into inhaler and inhale., Disp: , Rfl:  .  traMADol (ULTRAM) 50 MG tablet, Take 1 tablet (50 mg total) by mouth every 6 (six) hours as needed., Disp: 120 tablet, Rfl: 5 .  traZODone (DESYREL) 50 MG tablet, Take 150 mg by mouth at bedtime., Disp: , Rfl:  .  traZODone (DESYREL) 50 MG tablet, TAKE 3 TABLETS BY MOUTH EVERY NIGHT, Disp: , Rfl:  .  Vitamin D, Ergocalciferol, (DRISDOL) 50000 units CAPS capsule, Take 1 capsule (50,000 Units total) by mouth 2 (two) times a week x 6 weeks., Disp: 12 capsule, Rfl: 0 .  zinc oxide 20 % ointment, Apply topically., Disp: , Rfl:   Past Medical History: Past Medical History:  Diagnosis Date  . Anxiety   . Chronic back pain   . Chronic neck pain   . COPD (chronic obstructive pulmonary disease) ([place])   . Depression   . Hyperlipidemia   . Hypertension   . Opiate abuse, continuous   . Pneumonia     Tobacco Use: History  Smoking Status  . Former Smoker  . Packs/day: 0.00  . Years: 40.00  . Types: Cigarettes  . Quit date: 05/28/2016  Smokeless Tobacco  . 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[employer] [facility] and [facility] Re[facility] Latest Ref Rng & Units 01/16/2016   PHART 7.350 - 7.450 7.44   PCO2ART 32.0 - 48.0 mmHg 42   HCO3 21.0 - 28.0 mEq/L 28.5(H)   O2SAT % 88.3       Pulmonary Assessment Scores:     Pulmonary Assessment Scores    Row Name 01/05/17 1612 03/04/17 1236 05/13/17 [place]  ADL Phase Entry Mid Exit   SOB Score total 52 34 26   Rest 0 0 0   Walk 2 1 1    Stairs 3 2 2    Bath 3 4 3    Dress 0 1 1   Shop 3 2 1      mMRC Score   mMRC Score 1  -  -      Pulmonary Function Assessment:     Pulmonary Function Assessment - 01/05/17 1609      Pulmonary Function Tests   FVC% 27 %   FEV1% 27 %   FEV1/FVC Ratio 74.48     Breath   Bilateral Breath Sounds Clear   Shortness of Breath Yes;Fear of Shortness of Breath;Limiting activity      Exercise Target Goals:    Exercise Program Goal: Individual exercise prescription set with THRR, safety & activity barriers. Participant demonstrates ability to understand and report RPE using BORG scale, to self-measure pulse accurately, and to acknowledge the importance of the exercise prescription.  Exercise Prescription Goal: Starting with aerobic activity 30 plus minutes a day, 3 days per week for initial exercise prescription. Provide home exercise prescription and guidelines that participant acknowledges understanding prior to discharge.  Activity Barriers & Risk Stratification:     Activity Barriers & Cardiac Risk Stratification - 01/05/17 1605      Activity Barriers & Cardiac Risk Stratification   Activity Barriers Back Problems;Deconditioning;Muscular Weakness;Shortness of Breath;Balance Concerns      6 Minute Walk:     6 Minute Walk    Row Name 01/05/17 1601 05/18/17 1254       6 Minute Walk   Phase Initial Discharge    Distance 625 feet 815 feet    Distance % Change  - 30 %    Distance Feet Change  - 190 ft    Walk Time 4.13  minutes 6 minutes    # of Rest Breaks 2  57 sec, 55 sec 0    MPH 1.72 1.54    METS 2.47 3.09    RPE 15 13    Perceived Dyspnea  4 2    VO2 Peak 8.63 10.82    Symptoms Yes (comment) No    Comments SOB, legs cramping  -    Resting HR 101 bpm 102 bpm    Resting BP 122/64 102/88    Resting Oxygen Saturation   - 96 %    Max Ex. HR 114 bpm 141 bpm    Max Ex. BP 132/64 150/80    2 Minute Post BP 128/70 130/70      Interval HR   Baseline HR (retired) 101  -    1 Minute HR 104 112    2 Minute HR 103 114    3 Minute HR 108 128    4 Minute HR 109 139    5 Minute HR 112 141    6 Minute HR 114 140    2 Minute Post HR 109 104    Interval Heart Rate? Yes Yes      Interval Oxygen   Interval Oxygen? Yes Yes    Baseline Oxygen Saturation % 95 % 96 %    Resting Liters of Oxygen 3 L  -    1 Minute Oxygen Saturation % 94 % 95 %    1 Minute Liters of Oxygen 3 L 4 L    2 Minute Oxygen Saturation % 94 % 95 %    2 Minute Liters of  Oxygen 3 L 4 L    3 Minute Oxygen Saturation % 94 % 94 %    3 Minute Liters of Oxygen 3 L 4 L    4 Minute Oxygen Saturation % 96 % 93 %    4 Minute Liters of Oxygen 3 L 4 L    5 Minute Oxygen Saturation % 95 % 90 %    5 Minute Liters of Oxygen 3 L 4 L    6 Minute Oxygen Saturation % 95 % 92 %    6 Minute Liters of Oxygen 3 L 4 L    2 Minute Post Oxygen Saturation % 96 % 97 %    2 Minute Post Liters of Oxygen 3 L 4 L      Oxygen Initial Assessment:     Oxygen Initial Assessment - 01/05/17 1616      Home Oxygen   Home Oxygen Device Home Concentrator;E-Tanks   Sleep Oxygen Prescription Continuous   Liters per minute 3   Home Exercise Oxygen Prescription Continuous   Liters per minute 3   Home at Rest Exercise Oxygen Prescription Continuous   Liters per minute 3   Compliance with Home Oxygen Use Yes     Initial 6 min Walk   Oxygen Used Continuous;E-Tanks   Liters per minute 3   Resting Oxygen Saturation  95 %   Exercise Oxygen Saturation  during 6 min  walk 94 %     Program Oxygen Prescription   Program Oxygen Prescription Continuous;E-Tanks   Liters per minute 3     Intervention   Short Term Goals To learn and understand importance of monitoring SPO2 with pulse oximeter and demonstrate accurate use of the pulse oximeter.;To learn and exhibit compliance with exercise, home and travel O2 prescription;To Learn and understand importance of maintaining oxygen saturations>88%   Long  Term Goals Exhibits compliance with exercise, home and travel O2 prescription;Maintenance of O2 saturations>88%;Verbalizes importance of monitoring SPO2 with pulse oximeter and return demonstration      Oxygen Re-Evaluation:     Oxygen Re-Evaluation    Row Name 01/14/17 1524 02/11/17 1519 03/18/17 1359 04/13/17 1155 04/29/17 1519     Program Oxygen Prescription   Program Oxygen Prescription  - Continuous;E-Tanks E-Tanks;Continuous E-Tanks;Continuous E-Tanks;Continuous   Liters per minute  - 3 3 3 3      Home Oxygen   Home Oxygen Device  - E-Tanks;Home Concentrator E-Tanks;Home Concentrator E-Tanks;Home Concentrator;Portable Concentrator Portable Concentrator;Home Concentrator;E-Tanks   Sleep Oxygen Prescription  - Continuous Continuous Continuous Continuous   Liters per minute  - 3 3 3 3    Home Exercise Oxygen Prescription  - Continuous Continuous Continuous Continuous   Liters per minute  - 4 4 4 4    Home at Rest Exercise Oxygen Prescription  - Continuous Continuous Continuous Continuous   Liters per minute  - 3 3 3 3    Compliance with Home Oxygen Use  - Yes  - Yes Yes     Goals/Expected Outcomes   Short Term Goals To learn and demonstrate proper purse lipped breathing techniques or other breathing techniques. To Learn and understand importance of maintaining oxygen saturations>88%;To learn and demonstrate proper purse lipped breathing techniques or other breathing techniques.;To learn and understand importance of monitoring SPO2 with pulse oximeter  and demonstrate accurate use of the pulse oximeter.;To learn and demonstrate proper use of respiratory medications;To learn and exhibit compliance with exercise, home and travel O2 prescription To learn and exhibit compliance with exercise, home and travel O2  prescription;To learn and understand importance of monitoring SPO2 with pulse oximeter and demonstrate accurate use of the pulse oximeter.;To Learn and understand importance of maintaining oxygen saturations>88%;To learn and demonstrate proper purse lipped breathing techniques or other breathing techniques.;To learn and demonstrate proper use of respiratory medications To learn and exhibit compliance with exercise, home and travel O2 prescription;To learn and understand importance of maintaining oxygen saturations>88%;To learn and demonstrate proper use of respiratory medications;To learn and demonstrate proper pursed lip breathing techniques or other breathing techniques.;To learn and understand importance of monitoring SPO2 with pulse oximeter and demonstrate accurate use of the pulse oximeter. To learn and exhibit compliance with exercise, home and travel O2 prescription;To learn and understand importance of maintaining oxygen saturations>88%;To learn and demonstrate proper use of respiratory medications;To learn and demonstrate proper pursed lip breathing techniques or other breathing techniques.;To learn and understand importance of monitoring SPO2 with pulse oximeter and demonstrate accurate use of the pulse oximeter.   Long  Term Goals  - Exhibits compliance with exercise, home and travel O2 prescription;Maintenance of O2 saturations>88%;Compliance with respiratory medication;Exhibits proper breathing techniques, such as purse lipped breathing or other method taught during program session;Demonstrates proper use of MDI's;Verbalizes importance of monitoring SPO2 with pulse oximeter and return demonstration Exhibits compliance with exercise, home  and travel O2 prescription;Maintenance of O2 saturations>88%;Compliance with respiratory medication;Demonstrates proper use of MDI's;Exhibits proper breathing techniques, such as purse lipped breathing or other method taught during program session;Verbalizes importance of monitoring SPO2 with pulse oximeter and return demonstration Exhibits compliance with exercise, home and travel O2 prescription;Maintenance of O2 saturations>88%;Compliance with respiratory medication;Demonstrates proper use of MDI's;Exhibits proper breathing techniques, such as pursed lip breathing or other method taught during program session;Verbalizes importance of monitoring SPO2 with pulse oximeter and return demonstration Exhibits compliance with exercise, home and travel O2 prescription;Maintenance of O2 saturations>88%;Compliance with respiratory medication;Demonstrates proper use of MDI's;Exhibits proper breathing techniques, such as pursed lip breathing or other method taught during program session;Verbalizes importance of monitoring SPO2 with pulse oximeter and return demonstration   Comments Mr. Villamar was taught how purse lip breathing is to help with his shortness of breath while exercising.  Liliane Channel has been doing well with his oxygen therapy.  He is compliant at home and while in program. He has had good O2 saturations.  He is using PLB fairly independent and has found it helpful at home too.  He needs more work on diaphragmatic breathing for relaxation and breath control.  He carries his pulse oximeter with him everywhere and checks his saturations when he is feeling low.  He has not had any problems with his medications and is doing a better job of keeping his spacer clean. Ricks exercise tolerance has gone up since starting the program. His heart rate has not dropped like it has in the past and he is getting proficient with his PLB. He is checks his oxygen saturations at home and in class and knows he needs to be 88% or above.  He is using his medications properly and verbalizes understanding of how to use them. Since Liliane Channel has been absent from the [name] due to radiation therapy his oxygen saturation have been in the upper 90's. He has a new portable concentrater making it easier for him to get around. Liliane Channel is using his PLB techniques at home and currently in class.  Liliane Channel is independent with checking his oxygen. He states his oxygen has been staying up since he got his new pulsed concentrator. He is using  4 liters on exertion. His pulse ox was giving him low readings at times but when looking at his device the batteries were getting low. He states after the batteries were changed the reading were better. His PLB has been getting better and he uses it in class. He needs to focus on using PLB outside of [name].   Goals/Expected Outcomes Short Term: Mr. Zurcher needs to be more proficient in  purse lipped breathing techniques. Long Term: Patient to be independent with purse lip breathing techniques. Short: Continue to work on breathing techniques.  Long: Continue to be independent with his oxygen therapy. Short: continue to work on PLB and monitor spo2 at home. Long: Be independent with PLB and use his respiratory medications properly. Short: Work on PLB. to improve oxygenation. Long: Titrate oxygen to 3 liters while exercising.  Short: Properly check is oxygen with his Pulse Oximeter. Long: Use Pulse oximeter independently and maintain oxygen saturation above 88 percent      Oxygen Discharge (Final Oxygen Re-Evaluation):     Oxygen Re-Evaluation - 04/29/17 1519      Program Oxygen Prescription   Program Oxygen Prescription E-Tanks;Continuous   Liters per minute 3     Home Oxygen   Home Oxygen Device Portable Concentrator;Home Concentrator;E-Tanks   Sleep Oxygen Prescription Continuous   Liters per minute 3   Home Exercise Oxygen Prescription Continuous   Liters per minute 4   Home at Rest Exercise Oxygen Prescription  Continuous   Liters per minute 3   Compliance with Home Oxygen Use Yes     Goals/Expected Outcomes   Short Term Goals To learn and exhibit compliance with exercise, home and travel O2 prescription;To learn and understand importance of maintaining oxygen saturations>88%;To learn and demonstrate proper use of respiratory medications;To learn and demonstrate proper pursed lip breathing techniques or other breathing techniques.;To learn and understand importance of monitoring SPO2 with pulse oximeter and demonstrate accurate use of the pulse oximeter.   Long  Term Goals Exhibits compliance with exercise, home and travel O2 prescription;Maintenance of O2 saturations>88%;Compliance with respiratory medication;Demonstrates proper use of MDI's;Exhibits proper breathing techniques, such as pursed lip breathing or other method taught during program session;Verbalizes importance of monitoring SPO2 with pulse oximeter and return demonstration   Comments Liliane Channel is independent with checking his oxygen. He states his oxygen has been staying up since he got his new pulsed concentrator. He is using 4 liters on exertion. His pulse ox was giving him low readings at times but when looking at his device the batteries were getting low. He states after the batteries were changed the reading were better. His PLB has been getting better and he uses it in class. He needs to focus on using PLB outside of [name].   Goals/Expected Outcomes Short: Properly check is oxygen with his Pulse Oximeter. Long: Use Pulse oximeter independently and maintain oxygen saturation above 88 percent      Initial Exercise Prescription:     Initial Exercise Prescription - 01/05/17 1600      Date of Initial Exercise RX and Referring Provider   Date 01/05/17   Referring Provider [name] MD     Oxygen   Oxygen Continuous   Liters 3     Treadmill   MPH 1.7   Grade 0   Minutes 15   METs 2.3     NuStep   Level 1   SPM 80    Minutes 15   METs 2     REL-XR  Level 1   Speed 50   Minutes 15   METs 2     Prescription Details   Frequency (times per week) 3   Duration Progress to 45 minutes of aerobic exercise without signs/symptoms of physical distress     Intensity   THRR 40-80% of Max Heartrate 124-147   Ratings of Perceived Exertion 11-13   Perceived Dyspnea 0-4     Progression   Progression Continue to progress workloads to maintain intensity without signs/symptoms of physical distress.     Resistance Training   Training Prescription Yes   Weight 3 lbs   Reps 10-15      Perform Capillary Blood Glucose checks as needed.  Exercise Prescription Changes:     Exercise Prescription Changes    Row Name 01/14/17 1400 01/27/17 1400 02/10/17 1600 02/11/17 1500 02/24/17 1500     Response to Exercise   Blood Pressure (Admit) 142/74 130/66 116/68  - 114/60   Blood Pressure (Exercise) 142/80 128/70 124/70  - 164/78   Blood Pressure (Exit) 120/80 118/68 112/60  - 120/76   Heart Rate (Admit) 104 bpm 98 bpm 108 bpm  - 102 bpm   Heart Rate (Exercise) 118 bpm 105 bpm 108 bpm  - 119 bpm   Heart Rate (Exit) 105 bpm 102 bpm 112 bpm  - 108 bpm   Oxygen Saturation (Admit) 93 % 94 % 91 %  - 97 %   Oxygen Saturation (Exercise) 88 % 92 % 94 %  - 92 %   Oxygen Saturation (Exit) 97 % 97 % 96 %  - 98 %   Rating of Perceived Exertion (Exercise) 17 13 13   - 15   Perceived Dyspnea (Exercise) 4 2 2   - 2   Symptoms SOB, fatigued SOB, fatigued SOB, fatigued  - fatigue   Comments first full day of exercise  -  -  -  -   Duration Progress to 45 minutes of aerobic exercise without signs/symptoms of physical distress Progress to 45 minutes of aerobic exercise without signs/symptoms of physical distress Continue with 45 min of aerobic exercise without signs/symptoms of physical distress.  - Continue with 45 min of aerobic exercise without signs/symptoms of physical distress.   Intensity THRR unchanged THRR unchanged THRR  unchanged  - THRR unchanged     Progression   Progression Continue to progress workloads to maintain intensity without signs/symptoms of physical distress. Continue to progress workloads to maintain intensity without signs/symptoms of physical distress. Continue to progress workloads to maintain intensity without signs/symptoms of physical distress.  - Continue to progress workloads to maintain intensity without signs/symptoms of physical distress.   Average METs 1.49 1.69 1.76  - 2.26     Resistance Training   Training Prescription Yes Yes Yes  - Yes   Weight 3 lbs 3 lbs 3 lbs  - 4 lbs   Reps 10-15 10-15 10-15  - 10-15     Interval Training   Interval Training No No No  - No     Oxygen   Oxygen Continuous Continuous Continuous  - Continuous   Liters 3 3 3   - 3     Treadmill   MPH 1 1 1   - 1.4   Grade 0 0 0  - 0   Minutes 4  2 min, 1:40 7  3 min 2 min 2 min 15  - 15   METs 1.77 1.77 1.77  - 2.07     NuStep   Level 1  1 2  - 5   SPM 56 74 59  - 68   Minutes 15 15 15   - 15   METs 1.5 2 1.8  - 2.4     REL-XR   Level 1 1 1   - 3   Speed 30 39  -  - 45   Minutes 15 15 15   - 15   METs 1.2 1.3 1.7  - 2.3     Home Exercise Plan   Plans to continue exercise at  -  -  - [employer] (comment)  [employer] and walking [employer] (comment)  [employer] and walking   Frequency  -  -  - Add 1 additional day to program exercise sessions. Add 1 additional day to program exercise sessions.   Initial Home Exercises Provided  -  -  - 02/11/17 02/11/17   Row Name 03/10/17 1500 03/25/17 1500 04/21/17 1500 05/05/17 1300 05/20/17 1500     Response to Exercise   Blood Pressure (Admit) 110/64 98/62 122/70 124/80 148/68   Blood Pressure (Exercise) 145/78  -  -  -  -   Blood Pressure (Exit) 118/84 120/60 118/66 122/64 124/64   Heart Rate (Admit) 110 bpm 95 bpm 103 bpm 107 bpm 110 bpm   Heart Rate (Exercise) 119 bpm 116 bpm 108 bpm 115 bpm 120 bpm   Heart Rate (Exit)  104 bpm 90 bpm 100 bpm 115 bpm 116 bpm   Oxygen Saturation (Admit) 98 % 97 % 97 % 97 % 99 %   Oxygen Saturation (Exercise) 94 % 94 % 93 % 96 % 94 %   Oxygen Saturation (Exit) 97 % 95 % 97 % 97 % 95 %   Rating of Perceived Exertion (Exercise) 15 13 13 11 12    Perceived Dyspnea (Exercise) 2 1 1 1 1    Symptoms fatigue and SOB on treadmill back pain back pain back pain back pain   Duration Continue with 45 min of aerobic exercise without signs/symptoms of physical distress. Continue with 45 min of aerobic exercise without signs/symptoms of physical distress. Continue with 45 min of aerobic exercise without signs/symptoms of physical distress. Continue with 45 min of aerobic exercise without signs/symptoms of physical distress. Continue with 45 min of aerobic exercise without signs/symptoms of physical distress.   Intensity THRR unchanged THRR unchanged THRR unchanged THRR unchanged THRR unchanged     Progression   Progression Continue to progress workloads to maintain intensity without signs/symptoms of physical distress. Continue to progress workloads to maintain intensity without signs/symptoms of physical distress. Continue to progress workloads to maintain intensity without signs/symptoms of physical distress. Continue to progress workloads to maintain intensity without signs/symptoms of physical distress. Continue to progress workloads to maintain intensity without signs/symptoms of physical distress.   Average METs 2.67 2.67 2.27 2.65 3.27     Resistance Training   Training Prescription Yes Yes Yes Yes Yes   Weight 4 lbs 4 lbs 4 lbs 4 lbs 5 lbs   Reps 10-15 10-15 10-15 10-15 10-15     Interval Training   Interval Training No No No No No     Oxygen   Oxygen Continuous Continuous Continuous Continuous Continuous   Liters 3 3 3 3 4      Treadmill   MPH 1.3  -  -  -  -   Grade 0  -  -  -  -   Minutes 15  -  -  -  -  METs 2  -  -  -  -     NuStep   Level 5 5 6 6 5    SPM 79 79 55 75 77    Minutes 15 15 15 15 15    METs 2.3 2.7 2.2 2.8 2.9     REL-XR   Level 5 6 5 5 6    Speed 45 50 37 38 48   Minutes 15 15 15 15 15    METs 3.7 3.7 2.5 2.5 4.1     Home Exercise Plan   Plans to continue exercise at [employer] (comment)  [employer] and walking [employer] (comment)  [employer] and walking [employer] (comment)  [employer] and walking [employer] (comment)  [employer] and walking [employer] (comment)  [employer] and walking   Frequency Add 1 additional day to program exercise sessions. Add 1 additional day to program exercise sessions. Add 1 additional day to program exercise sessions. Add 1 additional day to program exercise sessions. Add 1 additional day to program exercise sessions.   Initial Home Exercises Provided 02/11/17 02/11/17 02/11/17 02/11/17 02/11/17      Exercise Comments:     Exercise Comments    Row Name 01/14/17 1456 01/14/17 1530         Exercise Comments First full day of exercise!  Patient was oriented to gym and equipment including functions, settings, policies, and procedures.  Patient's individual exercise prescription and treatment plan were reviewed.  All starting workloads were established based on the results of the 6 minute walk test done at initial orientation visit.  The plan for exercise progression was also introduced and progression will be customized based on patient's performance and goals. Mr. Schwartz was exercising on the treadmill and was short of breath after 2 minutes. He did intervals of about two minutes, pulse and spO2 was checked when regaining his breath. S HR stayed was reading in the 60's but then increased into the 100's. Two sat probes were used to determine if the reading was correct. He was placed on telemetry for any drops in heart rate while exercising but now significant changes were noted.         Exercise Goals and Review:     Exercise Goals     Row Name 01/05/17 1607             Exercise Goals   Increase Physical Activity Yes       Intervention Provide advice, education, support and counseling about physical activity/exercise needs.;Develop an individualized exercise prescription for aerobic and resistive training based on initial evaluation findings, risk stratification, comorbidities and participant's personal goals.       Expected Outcomes Achievement of increased cardiorespiratory fitness and enhanced flexibility, muscular endurance and strength shown through measurements of functional capacity and personal statement of participant.       Increase Strength and Stamina Yes       Intervention Provide advice, education, support and counseling about physical activity/exercise needs.;Develop an individualized exercise prescription for aerobic and resistive training based on initial evaluation findings, risk stratification, comorbidities and participant's personal goals.       Expected Outcomes Achievement of increased cardiorespiratory fitness and enhanced flexibility, muscular endurance and strength shown through measurements of functional capacity and personal statement of participant.          Exercise Goals Re-Evaluation :     Exercise Goals Re-Evaluation    Row Name 01/27/17 1426 02/10/17 1615 02/11/17 1503 02/24/17 1543 03/10/17  1504     Exercise Goal Re-Evaluation   Exercise Goals Review Increase Physical Activity;Increase Strenth and Stamina Increase Physical Activity;Increase Strenth and Stamina Increase Physical Activity;Increase Strenth and Stamina Increase Physical Activity;Increase Strenth and Stamina Increase Physical Activity;Increase Strenth and Stamina   Comments Ravinder is off to a good start in rehab.  He missed a week due to doctor's appointments.  He is working on increasing his time on the treadmill.  Currently he is up to 7 min total.  We will continue to monitor his progress. Liliane Channel is doing well in rehab.   He is getting stronger and able to do more.  He is now up ot 15 minutes on the treadmill!! He has also increased the NuStep to level 4.  We will continue to monitor his progression.   Liliane Channel feels like he is improving.  He is doing his 15 min on the treadmill.  He is feeling stronger and has more stamina already.  Reviewed home exercise with pt today.  Pt plans to walk and go to [employer] for exercise.  Reviewed THR, pulse, RPE, sign and symptoms, and when to call 911 or MD.  Also discussed weather considerations and indoor options.  We also discussed how to slowly progress his home exercise.  Pt voiced understanding. Liliane Channel has continued to do well in rehab.  He is now up to 1.4 mph on the treadmill.  He is not currently doing his home exercise until cleared by cardiology.  However, we did discuss that it also does not mean that he can just go home and sit all day.  He needs to stay active. We will continue to monitor his progression.  Liliane Channel continues to work hard in rehab.  He is already at the half way point.  He is concerned about his cancer diagnosis and how it will affect him going forward.  He has moved up to level 5 on the XR.  We will continue to monitor his progression.   Expected Outcomes Short: Continue to increase time on treadmill.  Long; Continue to attend class to work on strength and stamina. Short: Try to increase workload on XR and attend more regularly by scheduling appointments around rehab.  Long: Increase strength and stamina. Short: Add in at least extra day of exercise at home and to attend class regularly.  Long: Exercise more frequently. Short: Increase speed/spm on both XR and NuStep.  Long: Move more at home. Short: Work on bringing speed back up on treadmill.  Long: Continue to increase activity level at home.    [place] Name 03/25/17 1453 04/07/17 1523 04/21/17 1509 05/05/17 1339 05/18/17 1301     Exercise Goal Re-Evaluation   Exercise Goals Review Increase Physical  Activity;Increase Strength and Stamina  - Increase Physical Activity;Increase Strength and Stamina Increase Physical Activity;Increase Strength and Stamina Increase Physical Activity;Increase Strength and Stamina   Comments Liliane Channel continues to do well in rehab. He continues to have problems with his back and has stayed off the treadmill.   He is doing 30 min on the NuStep instead.  He also increased his XR to level 6.  We will continue to monitor his progression. Out since last review with doctor's appointments Liliane Channel has returned to rehab and is doing well.  He is now up to level 6 on the NuStep.  He has had a few side effects from radiation, but mostly has been doing well with it.  We will continue to monitor his progression. Liliane Channel continues to  do fairly well in rehab.  He is still getting wheeled down and out by his wife.  He also has not been doing his exercise at home.  He did sit in on class last Friday and hopefully will start to move more at home.  We will continue to monitor his progress. 6 min walk test was done with patinet today. He improved by 190 feet. See 6 min walk data sheet for detailed report. Results reviewed with patient. Patient plans to exercise at a community facility upon graduation.    Expected Outcomes Short: Increase workload on NuStep and maintain spm for full 72mn.  Long: Increase activity at home.   - Short: Work on spm on [employer]  Long: Continue to try to increase activity at home Short: Walk at least one day at home.  Long: Increase physcial activity.  Short: graduate from [employer]  Long: Independently exercise upon graduation to maintain and continue to improve fitness level.    RBonanzaName 05/20/17 1506             Exercise Goal Re-Evaluation   Exercise Goals Review Increase Physical Activity;Increase Strength and Stamina;Understanding of Exercise Prescription       Comments RLiliane Channelwill be graduating on Friday!!  He has worked his way up to levels 6 and 5 on the XR and  NuStep from level 1 on both.  He has done well and is going to have surgery on his back in the next month.  He hopes to continue to exercise by going to [employer]       Expected Outcomes Short: Graduate!!  Long: Continue to exercise independently!          Discharge Exercise Prescription (Final Exercise Prescription Changes):     Exercise Prescription Changes - 05/20/17 1500      Response to Exercise   Blood Pressure (Admit) 148/68   Blood Pressure (Exit) 124/64   Heart Rate (Admit) 110 bpm   Heart Rate (Exercise) 120 bpm   Heart Rate (Exit) 116 bpm   Oxygen Saturation (Admit) 99 %   Oxygen Saturation (Exercise) 94 %   Oxygen Saturation (Exit) 95 %   Rating of Perceived Exertion (Exercise) 12   Perceived Dyspnea (Exercise) 1   Symptoms back pain   Duration Continue with 45 min of aerobic exercise without signs/symptoms of physical distress.   Intensity THRR unchanged     Progression   Progression Continue to progress workloads to maintain intensity without signs/symptoms of physical distress.   Average METs 3.27     Resistance Training   Training Prescription Yes   Weight 5 lbs   Reps 10-15     Interval Training   Interval Training No     Oxygen   Oxygen Continuous   Liters 4     NuStep   Level 5   SPM 77   Minutes 15   METs 2.9     REL-XR   Level 6   Speed 48   Minutes 15   METs 4.1     Home Exercise Plan   [facility] to [facility] exercise [facility] [employer](comment)  [employer] and walking   Frequency Add 1 additional day to program exercise sessions.   Initial Home Exercises Provided 02/11/17      Nutrition:  Target Goals: Understanding of nutrition guidelines, daily intake of sodium <15065m cholesterol <20025mcalories 30% from fat and 7% or less from saturated fats, daily to have 5 or more servings of  fruits and vegetables.  Biometrics:     Pre Biometrics - 01/05/17 1607      Pre Biometrics   Height 5' 10.9" (1.801 m)   Weight  191 lb 9.6 oz (86.9 kg)   Waist Circumference 43 inches   Hip Circumference 37 inches   Waist to Hip Ratio 1.16 %   BMI (Calculated) 26.9         Post Biometrics - 05/18/17 1300       Post  Biometrics   Height 5' 10.9" (1.801 m)   Weight 209 lb (94.8 kg)   Waist Circumference 42.5 inches   Hip Circumference 38 inches   Waist to Hip Ratio 1.12 %   BMI (Calculated) 29.23      Nutrition Therapy Plan and Nutrition Goals:   Nutrition Discharge: Rate Your Plate Scores:   Nutrition Goals Re-Evaluation:     Nutrition Goals Re-Evaluation    Row Name 02/11/17 1526 04/13/17 1218 04/29/17 1515         Goals   Current Weight 199 lb (90.3 kg) 206 lb (93.4 kg) 203 lb (92.1 kg)     Nutrition Goal  - Liliane Channel wants to weigh 165lbs Liliane Channel wants to weigh 165 pounds.      Comment Scheduled appt for 7/30 at 10am with dietician.  Would like to discuss diet for weight loss.  Liliane Channel states he had to cancel his last nutrition appointment.  Liliane Channel has lost some weight since he began exercising. He has been taking his medications as prescribed to decrease his fluid retention. Informed Liliane Channel to bring his paperwork and his food log so he can meet with the dietician.     Expected Outcome  - Short: Lose weight and meet with the Nutritionist. Long: Maintain weightloss after [name]. Short: Lose weight buy attending [name], meet with the Dietician. Long: maintain weightloss post [name].        Nutrition Goals Discharge (Final Nutrition Goals Re-Evaluation):     Nutrition Goals Re-Evaluation - 04/29/17 1515      Goals   Current Weight 203 lb (92.1 kg)   Nutrition Goal Liliane Channel wants to weigh 165 pounds.    Comment Liliane Channel has lost some weight since he began exercising. He has been taking his medications as prescribed to decrease his fluid retention. Informed Liliane Channel to bring his paperwork and his food log so he can meet with the dietician.   Expected Outcome Short: Lose weight buy attending [name], meet with  the Dietician. Long: maintain weightloss post [name].      Psychosocial: Target Goals: Acknowledge presence or absence of significant depression and/or stress, maximize coping skills, provide positive support system. Participant is able to verbalize types and ability to use techniques and skills needed for reducing stress and depression.   Initial Review & Psychosocial Screening:     Initial Psych Review & Screening - 01/05/17 [place]? Yes   Comments Mr Caleen Jobs has good support from his wife and one son. He states he has no depression and is looking forward to increasing his stamina and energy level through his participation in [place].     Barriers   Psychosocial barriers to participate in program There are no identifiable barriers or psychosocial needs.;The patient should benefit from training in stress management and relaxation.     Screening Interventions   Interventions Encouraged to exercise      Quality of Life Scores:     Quality of  Life - 05/13/17 1208      Quality of Life Scores   Health/Function Pre 19.81 %   Health/Function Post 12.84 %   Health/Function % Change -35.18 %   Socioeconomic Pre 19 %   Socioeconomic Post 15.86 %   Socioeconomic % Change  -16.53 %   Psych/Spiritual Pre 20.14 %   Psych/Spiritual Post 16.5 %   Psych/Spiritual % Change -18.07 %   Family Pre 20.8 %   Family Post 23.7 %   Family % Change 13.94 %   GLOBAL Pre 19.86 %   GLOBAL Post 15.73 %   GLOBAL % Change -20.8 %      PHQ-9: Recent Review Flowsheet Data    Depression screen [facility] 2/9 05/13/2017 03/04/2017 01/22/2017 01/13/2017 01/05/2017   Decreased Interest 2 2 0 1 1   Down, Depressed, Hopeless 2 1 0 3 0   PHQ - 2 Score 4 3 0 4 1   Altered sleeping 2 0 - 1 1   Tired, decreased energy 2 2 - 1 1   Change in appetite 2 2 - 0 0   Feeling bad or failure about yourself  1 2 - 1 1   Trouble concentrating 1 0 - 0 0   Moving slowly or  fidgety/restless 0 1 - 0 0   Suicidal thoughts 0 0 - 0 0   PHQ-9 Score 12 10 - 7 4   Difficult doing work/chores Somewhat difficult - - Somewhat difficult -     Interpretation of Total Score  Total Score Depression Severity:  1-4 = Minimal depression, 5-9 = Mild depression, 10-14 = Moderate depression, 15-19 = Moderately severe depression, 20-27 = Severe depression   Psychosocial Evaluation and Intervention:     Psychosocial Evaluation - 01/26/17 1220      Psychosocial Evaluation & Interventions   Interventions Stress management education;Encouraged to exercise with the program and follow exercise prescription;Relaxation education   Comments Counselor met with Mr. Macintyre Liliane Channel) today for initial psychosocial evaluation.  He is a 61 year old who has been diagnosed with both COPD and Emphysema.  He also is concerned about a PET Scan for cancer screening in his lungs this Thursday.  He has a strong support system with a spouse of 73 years; a son locally and mother a step-sister who live next door.  Liliane Channel reports he has his days and nights mixed up when it comes to sleeping.  He is on medication to help with this.  He has a good appetite.  Liliane Channel reports he has a history of depression and is on medication that treats most of these symptoms, but is considering asking the Dr. to increase this.  He states his mood is typically positive.  Liliane Channel has multiple stressors in his life currently with the PET Scan this week; his own health and finances.  Liliane Channel has goals to improve his breathing; increase his mobility and ability to walk and possibly lose some weight.  Staff will be following with Liliane Channel throughout the course of this program.     Expected Outcomes Liliane Channel will benefit from consistent exercise to achieve his stated goals.  He also will benefit from meeting with the dietician to address his weight loss goal.  Liliane Channel has multiple stressors in his life so participation in the psychoeducational components will help  increase his coping strategies.  Counselor will be following with Liliane Channel.   Continue Psychosocial Services  Follow up required by counselor      Psychosocial Re-Evaluation:  Psychosocial Re-Evaluation    Row Name 03/09/17 1233 04/20/17 1221 04/29/17 1502         Psychosocial Re-Evaluation   Current issues with Current Stress Concerns;Current Sleep Concerns;Current Psychotropic Meds;History of Depression;Current Depression  - Current Stress Concerns;Current Sleep Concerns;History of Depression;Current Depression     Comments Counselor follow up with Liliane Channel today reporting breathing better and using a little less oxygen when at home since coming in to this class.  He is also noticing his legs are stronger.  However, Liliane Channel is seeing an oncologist later this week due to a spot found on his lungs.  He was told this is 98% positive for cancer and he is not strong enough for a biopsy or surgery; so Liliane Channel is concerned to say the least.  He is also struggling with chronic back pain which is impacting his quality of life.  Counselor encouraged Liliane Channel to speak with his Dr. about possibly increasing his Cymbalta since his depression inventory indicated his symptoms for depression have increased with all that is going on in his life currently.  He agreed to do so. Counselor will follow with Liliane Channel on this. Counselor follow with Liliane Channel re: his recent cancer diagnosis on his right lung.  He has completed the radiation treatments and has experienced more tiredness as a result.  He states he will be followed in December with the Dr. Donivan Scull: this.  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  Liliane Channel has a procedure at the pain clinic tomorrow to "burn the nerves" that are causing him so much chronic pain.  He plans to return to this class after a few days - since he will be on medication for  several days.   Liliane Channel spoke with his Dr. about increasing his Cymbalta to help with his mood during all of these current stressors and he reports the Dr. did increase this temporarily. Liliane Channel says this has been somewhat helpful.  Counselor and staff will continue to follow.  Liliane Channel states that his mother inlaw lives next door and she attributes to his stress. He states that nothing is done right to her and his life is not private as he would like it to be. Liliane Channel seems to be pretty happy when he is in class. He is much more talkitive than he was when he first started. His breathing has improved which has made his confident increase. He does not have many hobbies at the moment other than watching tv and playing with his cats.     Expected Outcomes Liliane Channel will contact his Drs office today about his mood medications to see if an increase to help with his current depressive symptoms would be possible. Liliane Channel will continue taking his medication for depression.  He will go to the pain clinic tomorrow to address his chronic pain.  He will return to this class to address his exercise goals.   Short:Continue to exercise to revlieve stress. Long: maintain exercise and hobbies to decrease stress.     Interventions  - Stress management education Encouraged to attend Pulmonary Rehabilitation for the exercise;Stress management education     Continue Psychosocial Services  Follow up required by counselor Follow up required by staff Follow up required by staff        Psychosocial Discharge (Final Psychosocial Re-Evaluation):     Psychosocial Re-Evaluation -  04/29/17 1502      Psychosocial Re-Evaluation   Current issues with Current Stress Concerns;Current Sleep Concerns;History of Depression;Current Depression   Comments Liliane Channel states that his mother inlaw lives next door and she attributes to his stress. He states that nothing is done right to her and his life is not private as he would like it to be. Liliane Channel seems to be pretty  happy when he is in class. He is much more talkitive than he was when he first started. His breathing has improved which has made his confident increase. He does not have many hobbies at the moment other than watching tv and playing with his cats.   Expected Outcomes Short:Continue to exercise to revlieve stress. Long: maintain exercise and hobbies to decrease stress.   Interventions Encouraged to attend Pulmonary Rehabilitation for the exercise;Stress management education   Continue Psychosocial Services  Follow up required by staff      Education: Education Goals: Education classes will be provided on a weekly basis, covering required topics. Participant will state understanding/return demonstration of topics presented.  Learning Barriers/Preferences:     Learning Barriers/Preferences - 01/05/17 1608      Learning Barriers/Preferences   Learning Barriers None   Learning Preferences None      Education Topics: Initial Evaluation Education: - Verbal, written and demonstration of respiratory meds, RPE/PD scales, oximetry and breathing techniques. Instruction on use of nebulizers and MDIs: cleaning and proper use, rinsing mouth with steroid doses and importance of monitoring MDI activations.   Pulmonary Rehab from 05/18/2017 in [facility] Cardiac and Pulmonary Rehab  Date  01/05/17  Educator  LB  Instruction Review Code (retired)  2- meets goals/outcomes      General Nutrition Guidelines/Fats and Fiber: -Group instruction provided by verbal, written material, models and posters to present the general guidelines for heart healthy nutrition. Gives an explanation and review of dietary fats and fiber.   Pulmonary Rehab from 05/18/2017 in [facility] Cardiac and Pulmonary Rehab  Date  05/11/17  Educator  CR  Instruction Review Code  1- Verbalizes Understanding      Controlling Sodium/Reading Food Labels: -Group verbal and written material supporting the discussion of sodium use in heart healthy  nutrition. Review and explanation with models, verbal and written materials for utilization of the food label.   Pulmonary Rehab from 05/18/2017 in [facility] Cardiac and Pulmonary Rehab  Date  05/18/17  Educator  CR  Instruction Review Code  1- Verbalizes Understanding      Exercise Physiology & Risk Factors: - Group verbal and written instruction with models to review the exercise physiology of the cardiovascular system and associated critical values. Details cardiovascular disease risk factors and the goals associated with each risk factor.   Pulmonary Rehab from 05/18/2017 in [facility] Cardiac and Pulmonary Rehab  Date  05/01/17  Educator  [facility]  Instruction Review Code  1- Verbalizes Understanding      Aerobic Exercise & Resistance Training: - Gives group verbal and written discussion on the health impact of inactivity. On the components of aerobic and resistive training programs and the benefits of this training and how to safely progress through these programs.   Pulmonary Rehab from 05/18/2017 in [facility] Cardiac and Pulmonary Rehab  Date  02/20/17  Educator  [facility]      Flexibility, Balance, General Exercise Guidelines: - Provides group verbal and written instruction on the benefits of flexibility and balance training programs. Provides general exercise guidelines with specific guidelines to those with heart or lung  disease. Demonstration and skill practice provided.   Stress Management: - Provides group verbal and written instruction about the health risks of elevated stress, cause of high stress, and healthy ways to reduce stress.   Depression: - Provides group verbal and written instruction on the correlation between heart/lung disease and depressed mood, treatment options, and the stigmas associated with seeking treatment.   Pulmonary Rehab from 05/18/2017 in [facility] Cardiac and Pulmonary Rehab  Date  03/18/17  Educator  [facility]  Instruction Review Code (retired)  2- meets goals/outcomes       Exercise & Equipment Safety: - Individual verbal instruction and demonstration of equipment use and safety with use of the equipment.   Pulmonary Rehab from 05/18/2017 in [facility] Cardiac and Pulmonary Rehab  Date  01/14/17  Educator  [facility]      Infection Prevention: - Provides verbal and written material to individual with discussion of infection control including proper hand washing and proper equipment cleaning during exercise session.   Pulmonary Rehab from 05/18/2017 in [facility] Cardiac and Pulmonary Rehab  Date  01/14/17  Educator  [facility]      Falls Prevention: - Provides verbal and written material to individual with discussion of falls prevention and safety.   Pulmonary Rehab from 05/18/2017 in [facility] Cardiac and Pulmonary Rehab  Date  01/05/17  Educator  LB  Instruction Review Code (retired)  2- meets goals/outcomes      Diabetes: - Individual verbal and written instruction to review signs/symptoms of diabetes, desired ranges of glucose level fasting, after meals and with exercise. Advice that pre and post exercise glucose checks will be done for 3 sessions at entry of program.   Chronic Lung Diseases: - Group verbal and written instruction to review new updates, new respiratory medications, new advancements in procedures and treatments. Provide informative websites and "800" numbers of self-education.   Pulmonary Rehab from 05/18/2017 in [facility] Cardiac and Pulmonary Rehab  Date  04/29/17  Educator  [facility]  Instruction Review Code  1- Verbalizes Understanding      Lung Procedures: - Group verbal and written instruction to describe testing methods done to diagnose lung disease. Review the outcome of test results. Describe the treatment choices: Pulmonary Function Tests, ABGs and oximetry.   Energy Conservation: - Provide group verbal and written instruction for methods to conserve energy, plan and organize activities. Instruct on pacing techniques, use of adaptive equipment and  posture/positioning to relieve shortness of breath.   Triggers: - Group verbal and written instruction to review types of environmental controls: home humidity, furnaces, filters, dust mite/pet prevention, HEPA vacuums. To discuss weather changes, air quality and the benefits of nasal washing.   Exacerbations: - Group verbal and written instruction to provide: warning signs, infection symptoms, calling MD promptly, preventive modes, and value of vaccinations. Review: effective airway clearance, coughing and/or vibration techniques. Create an Sports administrator.   Pulmonary Rehab from 05/18/2017 in [facility] Cardiac and Pulmonary Rehab  Date  02/25/17  Educator  [facility]      Oxygen: - Individual and group verbal and written instruction on oxygen therapy. Includes supplement oxygen, available portable oxygen systems, continuous and intermittent flow rates, oxygen safety, concentrators, and Medicare reimbursement for oxygen.   Pulmonary Rehab from 05/18/2017 in [facility] Cardiac and Pulmonary Rehab  Date  01/05/17  Educator  LB      Respiratory Medications: - Group verbal and written instruction to review medications for lung disease. Drug class, frequency, complications, importance of spacers, rinsing mouth after steroid MDI's, and  proper cleaning methods for nebulizers.   Pulmonary Rehab from 05/18/2017 in [facility] Cardiac and Pulmonary Rehab  Date  01/05/17  Educator  LB  Instruction Review Code (retired)  2- meets goals/outcomes      AED/CPR: - Group verbal and written instruction with the use of models to demonstrate the basic use of the AED with the basic ABC's of resuscitation.   Pulmonary Rehab from 05/18/2017 in [facility] Cardiac and Pulmonary Rehab  Date  04/17/17  Educator  CE  Instruction Review Code  1- Verbalizes Understanding      Breathing Retraining: - Provides individuals verbal and written instruction on purpose, frequency, and proper technique of diaphragmatic breathing and pursed-lipped  breathing. Applies individual practice skills.   Pulmonary Rehab from 05/18/2017 in [facility] Cardiac and Pulmonary Rehab  Date  01/05/17  Educator  LB      Anatomy and Physiology of the Lungs: - Group verbal and written instruction with the use of models to provide basic lung anatomy and physiology related to function, structure and complications of lung disease.   Anatomy & Physiology of the Heart: - Group verbal and written instruction and models provide basic cardiac anatomy and physiology, with the coronary electrical and arterial systems. Review of: AMI, Angina, Valve disease, Heart Failure, Cardiac Arrhythmia, Pacemakers, and the ICD.   Pulmonary Rehab from 05/18/2017 in [facility] Cardiac and Pulmonary Rehab  Date  03/06/17  Educator  [facility]      Heart Failure: - Group verbal and written instruction on the basics of heart failure: signs/symptoms, treatments, explanation of ejection fraction, enlarged heart and cardiomyopathy.   Pulmonary Rehab from 05/18/2017 in [facility] Cardiac and Pulmonary Rehab  Date  03/06/17  Educator  [facility]      Sleep Apnea: - Individual verbal and written instruction to review Obstructive Sleep Apnea. Review of risk factors, methods for diagnosing and types of masks and machines for OSA.   Anxiety: - Provides group, verbal and written instruction on the correlation between heart/lung disease and anxiety, treatment options, and management of anxiety.   Relaxation: - Provides group, verbal and written instruction about the benefits of relaxation for patients with heart/lung disease. Also provides patients with examples of relaxation techniques.   Pulmonary Rehab from 05/18/2017 in [facility] Cardiac and Pulmonary Rehab  Date  05/13/17  Educator  [facility]  Instruction Review Code  1- Verbalizes Understanding      Cardiac Medications: - Group verbal and written instruction to review commonly prescribed medications for heart disease. Reviews the medication, class of the drug,  and side effects.   Know Your Numbers: -Group verbal and written instruction about important numbers in your health.  Review of Cholesterol, Blood Pressure, Diabetes, and BMI and the role they play in your overall health.   Pulmonary Rehab from 05/18/2017 in [facility] Cardiac and Pulmonary Rehab  Date  05/08/17  Educator  [facility]  Instruction Review Code  1- Verbalizes Understanding      Other: -Provides group and verbal instruction on various topics (see comments)    Knowledge Questionnaire Score:     Knowledge Questionnaire Score - 05/13/17 1159      Knowledge Questionnaire Score   Pre Score 8/10   Post Score 10/10       Core Components/Risk Factors/Patient Goals at Admission:     Personal Goals and Risk Factors at Admission - 01/05/17 1618      Core Components/Risk Factors/Patient Goals on Admission    Weight Management Yes;Weight Loss   Intervention Weight Management:  Develop a combined nutrition and exercise program designed to reach desired caloric intake, while maintaining appropriate intake of nutrient and fiber, sodium and fats, and appropriate energy expenditure required for the weight goal.;Weight Management: Provide education and appropriate resources to help participant work on and attain dietary goals.   Admit Weight 191 lb 9.6 oz (86.9 kg)   Goal Weight: Short Term 186 lb (84.4 kg)   Goal Weight: Long Term 160 lb (72.6 kg)   Expected Outcomes Short Term: Continue to assess and modify interventions until short term weight is achieved;Weight Maintenance: Understanding of the daily nutrition guidelines, which includes 25-35% calories from fat, 7% or less cal from saturated fats, less than 210m cholesterol, less than 1.5gm of sodium, & 5 or more servings of fruits and vegetables daily;Weight Loss: Understanding of general recommendations for a balanced deficit meal plan, which promotes 1-2 lb weight loss per week and includes a negative energy balance of [phone]  kcal/d;Understanding recommendations for meals to include 15-35% energy as protein, 25-35% energy from fat, 35-60% energy from carbohydrates, less than 2011mof dietary cholesterol, 20-35 gm of total fiber daily;Understanding of distribution of calorie intake throughout the day with the consumption of 4-5 meals/snacks   Tobacco Cessation Yes   Number of packs per day Quit 11/17; still using nicotine patches   Improve shortness of breath with ADL's Yes   Intervention Provide education, individualized exercise plan and daily activity instruction to help decrease symptoms of SOB with activities of daily living.   Expected Outcomes Short Term: Achieves a reduction of symptoms when performing activities of daily living.   Develop more efficient breathing techniques such as purse lipped breathing and diaphragmatic breathing; and practicing self-pacing with activity Yes   Intervention Provide education, demonstration and support about specific breathing techniuqes utilized for more efficient breathing. Include techniques such as pursed lipped breathing, diaphragmatic breathing and self-pacing activity.   Expected Outcomes Short Term: Participant will be able to demonstrate and use breathing techniques as needed throughout daily activities.   Increase knowledge of respiratory medications and ability to use respiratory devices properly  Yes  Advair, Spiriva, Atrovent, Albuterol SVN and MDI; spacer given   Intervention Provide education and demonstration as needed of appropriate use of medications, inhalers, and oxygen therapy.   Expected Outcomes Short Term: Achieves understanding of medications use. Understands that oxygen is a medication prescribed by physician. Demonstrates appropriate use of inhaler and oxygen therapy.   Hypertension Yes   Intervention Provide education on lifestyle modifcations including regular physical activity/exercise, weight management, moderate sodium restriction and increased  consumption of fresh fruit, vegetables, and low fat dairy, alcohol moderation, and smoking cessation.;Monitor prescription use compliance.   Expected Outcomes Short Term: Continued assessment and intervention until BP is < 140/9029mG in hypertensive participants. < 130/25m104m in hypertensive participants with diabetes, heart failure or chronic kidney disease.;Long Term: Maintenance of blood pressure at goal levels.      Core Components/Risk Factors/Patient Goals Review:      Goals and Risk Factor Review    Row Name 02/11/17 1508 03/04/17 1221 04/13/17 1203 04/29/17 1529       Core Components/Risk Factors/Patient Goals Review   Personal Goals Review Hypertension;Tobacco Cessation;Weight Management/Obesity;Improve shortness of breath with ADL's Weight Management/Obesity;Increase knowledge of respiratory medications and ability to use respiratory devices properly.;Develop more efficient breathing techniques such as purse lipped breathing and diaphragmatic breathing and practicing self-pacing with activity.;Improve shortness of breath with ADL's;Stress Weight Management/Obesity;Stress;Increase knowledge of respiratory medications and ability to  use respiratory devices properly. Weight Management/Obesity;Stress;Hypertension    Review Liliane Channel has been doing well in rehab.  He is feeling stronger and already has more stamina as well.   He is able to do more at home with less SOB.  However, his weight is starting to trend up.  He has quit smoking completely and stopped using the patches a week ago!!  He is trying to eat better, but continues to see weight gain.  He did mention that his stomach is bothersome with both swelling and acid.  He also has lots of issues with consitpation with his hernias.  We discussed meeting with dietician and made him an appointment that his wife can also attend.  Rick checks his blood pressure regularly at home. He has not been recording his numbers but plans to start tracking to  take with him to appointment with cardiologist on 8/3.   Liliane Channel reported feeling better since beginning classes. He does express concerns about his wife continuing to smoke, but she has no plans to quit around him. Liliane Channel is continuing to practice pursed lip breathing and using his medications correctly. His main concern is his pain when he walks, and he is in line to see a doctor at the pain clinic to help with this. His cardiologist told him he has an irregular heart beat and to rest if it starts to beat too slow or too fast.  His appointment with the nutritionist had to be cancelled, but he reported that he would let the staff know when he is ready to make another appointment as he still wishes to have help changing his eating habits.  Liliane Channel is back to [place] today after being out for radiation therapy. He has been maintaining his weight since he has been out. His stress levels have been down since he obtained a portable concentrator. He plans to keep attending [name] since his next radiation appointment is in December. He is using his nebulizer and inhalers as prescribed. Ricks blood pressure has been improving since the start of the program. Liliane Channel states he is eating too much. His stress levels are up because of his mother inlaw who lives next door.     Expected Outcomes Short: Attend nutrition appointment and work on weight.  Long: Continue to work on risk factors modifications. Short: he wants to meet with the nutrionist soon when it is better timing for him and his wife; Long: he wants to get in with the pain clinic to manage pain control Short: Work on losing weight and decrease stress levels. Long: Maintain Weightloss and keep stress to a minimum after [name]. Short: Work on weight loss, and decrease stress levels. Long: Maintain a stress free environment and maintain adequate blood pressures.       Core Components/Risk Factors/Patient Goals at Discharge (Final Review):      Goals and Risk  Factor Review - 04/29/17 1529      Core Components/Risk Factors/Patient Goals Review   Personal Goals Review Weight Management/Obesity;Stress;Hypertension   Review Ricks blood pressure has been improving since the start of the program. Liliane Channel states he is eating too much. His stress levels are up because of his mother inlaw who lives next door.    Expected Outcomes Short: Work on weight loss, and decrease stress levels. Long: Maintain a stress free environment and maintain adequate blood pressures.      ITP Comments:     ITP Comments    Row Name 01/26/17 [id number] 02/13/17 1140 02/23/17 0911  02/23/17 1545 03/18/17 1353   ITP Comments 30 day note review with Dr Emily Filbert, Medical Director of [place] Patient attended education "Know Your Numbers" 30 day review completed ITP sent to Dr. Ramonita Lab for Dr. Emily Filbert Director of [place]. Continue with ITP unless changes are made by physician.  Spoke with Liliane Channel and the effects of second hand smoke that his wife may be causing. He stated that she wants to quit but has not tried yet. Informed patient that his health is at risk with second hand smoke. He is going to ask her to at least not smoke in the car before class. Liliane Channel will be out September 5th,7th,10th and 12th due to radiation therapy for a Small mass in his lung. He is going to try to attend if he is feeling up to it.   [place] Name 03/23/17 0845 04/07/17 1521 04/13/17 1226 04/20/17 0843 04/20/17 1226   ITP Comments 30 day review completed. ITP sent to Dr. Emily Filbert Director of [place]. Continue with ITP unless changes are made by physician.   Liliane Channel has been out since 03/23/17.  He is undergoing multiple appointments for his possible lung cancer.  He is scheduled to return on 9/17. Liliane Channel is back to [place] after his radiation therapy. He is doing very well with his exercises considering he has been out. His stress levels have gone down since he obtained a portable oxygen concentrator.  30 day review  completed. ITP sent to Dr. Emily Filbert Director of [place]. Continue with ITP unless changes are made by physician.   Liliane Channel has obtained a Marine scientist that has a 34 day trail. He was checking his pulse oximeter at home while on the pulsed machine and it was 87 percent. The batteries needed changed after looking at his device. Informed him to message his doctor about whether he needs continuous flow or pulsed.    [place] Name 05/18/17 0841 05/22/17 1140         ITP Comments 30 day review completed. ITP sent to Dr. Emily Filbert Director of [place]. Continue with ITP unless changes are made by physician.   Discharge ITP sent and signed by Dr. Sabra Heck.  Discharge Summary routed to PCP and pulmonologist         Comments: Discharge ITP

## 2017-05-22 NOTE — Progress Notes (Signed)
Discharge Progress Report  Patient Details  Name: Stanley Rios. MRN: [id number] Date of Birth: Dec 27, 1955 Referring Provider:     Pulmonary Rehab from 01/05/2017 in [facility]  Referring Provider  [name] MD       Number of Visits: 36/36  Reason for Discharge:  Patient reached a stable level of exercise. Patient independent in their exercise. Patient has met program and personal goals.  Smoking History:  History  Smoking Status  . Former Smoker  . Packs/day: 0.00  . Years: 40.00  . Types: Cigarettes  . Quit date: 05/28/2016  Smokeless Tobacco  . Former Systems developer    Comment: continues to use patches    Diagnosis:  Chronic obstructive pulmonary disease, unspecified COPD type ([place])  ADL [facility]:     Pulmonary Assessment Scores    Row Name 01/05/17 1612 03/04/17 1236 05/13/17 1159     ADL [facility]   ADL Phase Entry Mid Exit   SOB Score total 52 34 26   Rest 0 0 0   Walk 2 1 1    Stairs 3 2 2    Bath 3 4 3    Dress 0 1 1   Shop 3 2 1      mMRC Score   mMRC Score 1  -  -      Initial Exercise Prescription:     Initial Exercise Prescription - 01/05/17 1600      Date of Initial Exercise RX and Referring Provider   Date 01/05/17   Referring Provider [name] MD     Oxygen   Oxygen Continuous   Liters 3     Treadmill   MPH 1.7   Grade 0   Minutes 15   METs 2.3     NuStep   Level 1   SPM 80   Minutes 15   METs 2     REL-XR   Level 1   Speed 50   Minutes 15   METs 2     Prescription Details   Frequency (times per week) 3   Duration Progress to 45 minutes of aerobic exercise without signs/symptoms of physical distress     Intensity   THRR 40-80% of Max Heartrate 124-147   Ratings of Perceived Exertion 11-13   Perceived Dyspnea 0-4     Progression   Progression Continue to progress workloads to maintain intensity without signs/symptoms of physical distress.     Resistance Training   Training Prescription  Yes   Weight 3 lbs   Reps 10-15      Discharge Exercise Prescription (Final Exercise Prescription Changes):     Exercise Prescription Changes - 05/20/17 1500      Response to Exercise   Blood Pressure (Admit) 148/68   Blood Pressure (Exit) 124/64   Heart Rate (Admit) 110 bpm   Heart Rate (Exercise) 120 bpm   Heart Rate (Exit) 116 bpm   Oxygen Saturation (Admit) 99 %   Oxygen Saturation (Exercise) 94 %   Oxygen Saturation (Exit) 95 %   Rating of Perceived Exertion (Exercise) 12   Perceived Dyspnea (Exercise) 1   Symptoms back pain   Duration Continue with 45 min of aerobic exercise without signs/symptoms of physical distress.   Intensity THRR unchanged     Progression   Progression Continue to progress workloads to maintain intensity without signs/symptoms of physical distress.   Average METs 3.27     Resistance Training   Training Prescription Yes   Weight 5  lbs   Reps 10-15     Interval Training   Interval Training No     Oxygen   Oxygen Continuous   Liters 4     NuStep   Level 5   SPM 77   Minutes 15   METs 2.9     REL-XR   Level 6   Speed 48   Minutes 15   METs 4.1     Home Exercise Plan   Plans to continue exercise at [employer] (comment)  [employer] and walking   Frequency Add 1 additional day to program exercise sessions.   Initial Home Exercises Provided 02/11/17      Functional Capacity:     6 Minute Walk    Row Name 01/05/17 1601 05/18/17 1254       6 Minute Walk   Phase Initial Discharge    Distance 625 feet 815 feet    Distance % Change  - 30 %    Distance Feet Change  - 190 ft    Walk Time 4.13 minutes 6 minutes    # of Rest Breaks 2  57 sec, 55 sec 0    MPH 1.72 1.54    METS 2.47 3.09    RPE 15 13    Perceived Dyspnea  4 2    VO2 Peak 8.63 10.82    Symptoms Yes (comment) No    Comments SOB, legs cramping  -    Resting HR 101 bpm 102 bpm    Resting BP 122/64 102/88    Resting Oxygen Saturation   - 96 %     Max Ex. HR 114 bpm 141 bpm    Max Ex. BP 132/64 150/80    2 Minute Post BP 128/70 130/70      Interval HR   Baseline HR (retired) 101  -    1 Minute HR 104 112    2 Minute HR 103 114    3 Minute HR 108 128    4 Minute HR 109 139    5 Minute HR 112 141    6 Minute HR 114 140    2 Minute Post HR 109 104    Interval Heart Rate? Yes Yes      Interval Oxygen   Interval Oxygen? Yes Yes    Baseline Oxygen Saturation % 95 % 96 %    Resting Liters of Oxygen 3 L  -    1 Minute Oxygen Saturation % 94 % 95 %    1 Minute Liters of Oxygen 3 L 4 L    2 Minute Oxygen Saturation % 94 % 95 %    2 Minute Liters of Oxygen 3 L 4 L    3 Minute Oxygen Saturation % 94 % 94 %    3 Minute Liters of Oxygen 3 L 4 L    4 Minute Oxygen Saturation % 96 % 93 %    4 Minute Liters of Oxygen 3 L 4 L    5 Minute Oxygen Saturation % 95 % 90 %    5 Minute Liters of Oxygen 3 L 4 L    6 Minute Oxygen Saturation % 95 % 92 %    6 Minute Liters of Oxygen 3 L 4 L    2 Minute Post Oxygen Saturation % 96 % 97 %    2 Minute Post Liters of Oxygen 3 L 4 L       Psychological, QOL, Others - Outcomes: PHQ  2/9: Depression screen [facility] 2/9 05/13/2017 03/04/2017 01/22/2017 01/13/2017 01/05/2017  Decreased Interest 2 2 0 1 1  Down, Depressed, Hopeless 2 1 0 3 0  PHQ - 2 Score 4 3 0 4 1  Altered sleeping 2 0 - 1 1  Tired, decreased energy 2 2 - 1 1  Change in appetite 2 2 - 0 0  Feeling bad or failure about yourself  1 2 - 1 1  Trouble concentrating 1 0 - 0 0  Moving slowly or fidgety/restless 0 1 - 0 0  Suicidal thoughts 0 0 - 0 0  PHQ-9 Score 12 10 - 7 4  Difficult doing work/chores Somewhat difficult - - Somewhat difficult -    Quality of Life:     Quality of Life - 05/13/17 1208      Quality of Life Scores   Health/Function Pre 19.81 %   Health/Function Post 12.84 %   Health/Function % Change -35.18 %   Socioeconomic Pre 19 %   Socioeconomic Post 15.86 %   Socioeconomic % Change  -16.53 %   Psych/Spiritual  Pre 20.14 %   Psych/Spiritual Post 16.5 %   Psych/Spiritual % Change -18.07 %   Family Pre 20.8 %   Family Post 23.7 %   Family % Change 13.94 %   GLOBAL Pre 19.86 %   GLOBAL Post 15.73 %   GLOBAL % Change -20.8 %      Personal Goals: Goals established at orientation with interventions provided to work toward goal.     Personal Goals and Risk Factors at Admission - 01/05/17 1618      Core Components/Risk Factors/Patient Goals on Admission    Weight Management Yes;Weight Loss   Intervention Weight Management: Develop a combined nutrition and exercise program designed to reach desired caloric intake, while maintaining appropriate intake of nutrient and fiber, sodium and fats, and appropriate energy expenditure required for the weight goal.;Weight Management: Provide education and appropriate resources to help participant work on and attain dietary goals.   Admit Weight 191 lb 9.6 oz (86.9 kg)   Goal Weight: Short Term 186 lb (84.4 kg)   Goal Weight: Long Term 160 lb (72.6 kg)   Expected Outcomes Short Term: Continue to assess and modify interventions until short term weight is achieved;Weight Maintenance: Understanding of the daily nutrition guidelines, which includes 25-35% calories from fat, 7% or less cal from saturated fats, less than 277m cholesterol, less than 1.5gm of sodium, & 5 or more servings of fruits and vegetables daily;Weight Loss: Understanding of general recommendations for a balanced deficit meal plan, which promotes 1-2 lb weight loss per week and includes a negative energy balance of [phone] kcal/d;Understanding recommendations for meals to include 15-35% energy as protein, 25-35% energy from fat, 35-60% energy from carbohydrates, less than 2080mof dietary cholesterol, 20-35 gm of total fiber daily;Understanding of distribution of calorie intake throughout the day with the consumption of 4-5 meals/snacks   Tobacco Cessation Yes   Number of packs per day Quit 11/17;  still using nicotine patches   Improve shortness of breath with ADL's Yes   Intervention Provide education, individualized exercise plan and daily activity instruction to help decrease symptoms of SOB with activities of daily living.   Expected Outcomes Short Term: Achieves a reduction of symptoms when performing activities of daily living.   Develop more efficient breathing techniques such as purse lipped breathing and diaphragmatic breathing; and practicing self-pacing with activity Yes   Intervention Provide education, demonstration and  support about specific breathing techniuqes utilized for more efficient breathing. Include techniques such as pursed lipped breathing, diaphragmatic breathing and self-pacing activity.   Expected Outcomes Short Term: Participant will be able to demonstrate and use breathing techniques as needed throughout daily activities.   Increase knowledge of respiratory medications and ability to use respiratory devices properly  Yes  Advair, Spiriva, Atrovent, Albuterol SVN and MDI; spacer given   Intervention Provide education and demonstration as needed of appropriate use of medications, inhalers, and oxygen therapy.   Expected Outcomes Short Term: Achieves understanding of medications use. Understands that oxygen is a medication prescribed by physician. Demonstrates appropriate use of inhaler and oxygen therapy.   Hypertension Yes   Intervention Provide education on lifestyle modifcations including regular physical activity/exercise, weight management, moderate sodium restriction and increased consumption of fresh fruit, vegetables, and low fat dairy, alcohol moderation, and smoking cessation.;Monitor prescription use compliance.   Expected Outcomes Short Term: Continued assessment and intervention until BP is < 140/27m HG in hypertensive participants. < 130/880mHG in hypertensive participants with diabetes, heart failure or chronic kidney disease.;Long Term: Maintenance of  blood pressure at goal levels.       Personal Goals Discharge:     Goals and Risk Factor Review    Row Name 02/11/17 1508 03/04/17 1221 04/13/17 1203 04/29/17 1529       Core Components/Risk Factors/Patient Goals Review   Personal Goals Review Hypertension;Tobacco Cessation;Weight Management/Obesity;Improve shortness of breath with ADL's Weight Management/Obesity;Increase knowledge of respiratory medications and ability to use respiratory devices properly.;Develop more efficient breathing techniques such as purse lipped breathing and diaphragmatic breathing and practicing self-pacing with activity.;Improve shortness of breath with ADL's;Stress Weight Management/Obesity;Stress;Increase knowledge of respiratory medications and ability to use respiratory devices properly. Weight Management/Obesity;Stress;Hypertension    Review Stanley Channelas been doing well in rehab.  He is feeling stronger and already has more stamina as well.   He is able to do more at home with less SOB.  However, his weight is starting to trend up.  He has quit smoking completely and stopped using the patches a week ago!!  He is trying to eat better, but continues to see weight gain.  He did mention that his stomach is bothersome with both swelling and acid.  He also has lots of issues with consitpation with his hernias.  We discussed meeting with dietician and made him an appointment that his wife can also attend.  Stanley Rios checks his blood pressure regularly at home. He has not been recording his numbers but plans to start tracking to take with him to appointment with cardiologist on 8/3.   Stanley Channeleported feeling better since beginning classes. He does express concerns about his wife continuing to smoke, but she has no plans to quit around him. Stanley Channels continuing to practice pursed lip breathing and using his medications correctly. His main concern is his pain when he walks, and he is in line to see a doctor at the pain clinic to help with  this. His cardiologist told him he has an irregular heart beat and to rest if it starts to beat too slow or too fast.  His appointment with the nutritionist had to be cancelled, but he reported that he would let the staff know when he is ready to make another appointment as he still wishes to have help changing his eating habits.  [place] [facility] [place] [facility]. He has been maintaining his weight since he has  been out. His stress levels have been down since he obtained a portable concentrator. He plans to keep attending [name] since his next radiation appointment is in December. He is using his nebulizer and inhalers as prescribed. Ricks blood pressure has been improving since the start of the program. Liliane Rios states he is eating too much. His stress levels are up because of his mother inlaw who lives next door.     Expected Outcomes Short: Attend nutrition appointment and work on weight.  Long: Continue to work on risk factors modifications. Short: he wants to meet with the nutrionist soon when it is better timing for him and his wife; Long: he wants to get in with the pain clinic to manage pain control Short: Work on losing weight and decrease stress levels. Long: Maintain Weightloss and keep stress to a minimum after [name]. Short: Work on weight loss, and decrease stress levels. Long: Maintain a stress free environment and maintain adequate blood pressures.       Exercise Goals and Review:     Exercise Goals    Row Name 01/05/17 1607             Exercise Goals   Increase Physical Activity Yes       Intervention Provide advice, education, support and counseling about physical activity/exercise needs.;Develop an individualized exercise prescription for aerobic and resistive training based on initial evaluation findings, risk stratification, comorbidities and participant's personal goals.       Expected Outcomes Achievement of increased cardiorespiratory  fitness and enhanced flexibility, muscular endurance and strength shown through measurements of functional capacity and personal statement of participant.       Increase Strength and Stamina Yes       Intervention Provide advice, education, support and counseling about physical activity/exercise needs.;Develop an individualized exercise prescription for aerobic and resistive training based on initial evaluation findings, risk stratification, comorbidities and participant's personal goals.       Expected Outcomes Achievement of increased cardiorespiratory fitness and enhanced flexibility, muscular endurance and strength shown through measurements of functional capacity and personal statement of participant.          Nutrition & Weight - Outcomes:     Pre Biometrics - 01/05/17 1607      Pre Biometrics   Height 5' 10.9" (1.801 m)   Weight 191 lb 9.6 oz (86.9 kg)   Waist Circumference 43 inches   Hip Circumference 37 inches   Waist to Hip Ratio 1.16 %   BMI (Calculated) 26.9         Post Biometrics - 05/18/17 1300       Post  Biometrics   Height 5' 10.9" (1.801 m)   Weight 209 lb (94.8 kg)   Waist Circumference 42.5 inches   Hip Circumference 38 inches   Waist to Hip Ratio 1.12 %   BMI (Calculated) 29.23      Nutrition:   Nutrition Discharge:   Education Questionnaire Score:     Knowledge Questionnaire Score - 05/13/17 1159      Knowledge Questionnaire Score   Pre Score 8/10   Post Score 10/10      Goals reviewed with patient; copy given to patient.

## 2017-05-22 NOTE — Progress Notes (Signed)
Daily Session Note  Patient Details  Name: Stanley Rios. MRN: [id number] Date of Birth: February 22, 1956 Referring Provider:     Pulmonary Rehab from 01/05/2017 in [facility]  Referring Provider  Stanley Chute MD      Encounter Date: 05/22/2017  Check In:     Session Check In - 05/22/17 1138      Check-In   Location [facility]-Cardiac & Pulmonary Rehab   Staff Present Stanley Sam, MA, ACSM RCEP, Exercise Physiologist;Stanley Heiberger Alcus Dad, RN BSN   Supervising physician immediately available to respond to emergencies [name] immediately available ER MD   Physician(s) Dr. Alfred Rios and Stanley Rios   Medication changes reported     No   Fall or balance concerns reported    No   Warm-up and Cool-down Performed as group-led instruction   Resistance Training Performed Yes   VAD Patient? No     Pain Assessment   Currently in Pain? No/denies         History  Smoking Status  . Former Smoker  . Packs/day: 0.00  . Years: 40.00  . Types: Cigarettes  . Quit date: 05/28/2016  Smokeless Tobacco  . Former Systems developer    Comment: continues to use patches    Goals Met:  Proper associated with RPD/PD & O2 Sat Independence with exercise equipment Exercise tolerated well No report of cardiac concerns or symptoms Strength training completed today  Goals Unmet:  Not Applicable  Comments:  Stanley Rios graduated today from cardiac rehab with 36 sessions completed.  Details of the patient's exercise prescription and what He needs to do in order to continue the prescription and progress were discussed with patient.  Patient was given a copy of prescription and goals.  Patient verbalized understanding.  Mercy plans to continue to exercise by joining [employer].   Dr. Emily Rios is Medical Director for [place] and [name].

## 2017-06-05 DIAGNOSIS — J432 Centrilobular emphysema: Secondary | ICD-10-CM | POA: Diagnosis not present

## 2017-06-05 DIAGNOSIS — R5383 Other fatigue: Secondary | ICD-10-CM | POA: Insufficient documentation

## 2017-06-05 DIAGNOSIS — G894 Chronic pain syndrome: Secondary | ICD-10-CM | POA: Diagnosis not present

## 2017-06-05 DIAGNOSIS — J431 Panlobular emphysema: Secondary | ICD-10-CM | POA: Diagnosis not present

## 2017-06-05 DIAGNOSIS — Z1159 Encounter for screening for other viral diseases: Secondary | ICD-10-CM | POA: Diagnosis not present

## 2017-06-09 ENCOUNTER — Ambulatory Visit
Admission: RE | Admit: 2017-06-09 | Discharge: 2017-06-09 | Disposition: A | Discharge status: Home / Self Care | Payer: PPO | Source: Ambulatory Visit | Attending: Pain Medicine | Admitting: Pain Medicine

## 2017-06-09 ENCOUNTER — Other Ambulatory Visit: Payer: Self-pay

## 2017-06-09 ENCOUNTER — Ambulatory Visit (HOSPITAL_BASED_OUTPATIENT_CLINIC_OR_DEPARTMENT_OTHER): Payer: PPO | Admitting: Pain Medicine

## 2017-06-09 VITALS — BP 126/91 | HR 96 | Temp 97.7°F | Resp 19 | Ht 71.0 in | Wt 207.0 lb

## 2017-06-09 DIAGNOSIS — M545 Low back pain: Secondary | ICD-10-CM

## 2017-06-09 DIAGNOSIS — Z981 Arthrodesis status: Secondary | ICD-10-CM | POA: Insufficient documentation

## 2017-06-09 DIAGNOSIS — Z9049 Acquired absence of other specified parts of digestive tract: Secondary | ICD-10-CM | POA: Insufficient documentation

## 2017-06-09 DIAGNOSIS — M51369 Other intervertebral disc degeneration, lumbar region without mention of lumbar back pain or lower extremity pain: Secondary | ICD-10-CM

## 2017-06-09 DIAGNOSIS — G8918 Other acute postprocedural pain: Secondary | ICD-10-CM | POA: Insufficient documentation

## 2017-06-09 DIAGNOSIS — M5136 Other intervertebral disc degeneration, lumbar region: Secondary | ICD-10-CM | POA: Diagnosis not present

## 2017-06-09 DIAGNOSIS — M47816 Spondylosis without myelopathy or radiculopathy, lumbar region: Secondary | ICD-10-CM

## 2017-06-09 DIAGNOSIS — G8929 Other chronic pain: Secondary | ICD-10-CM

## 2017-06-09 MED ORDER — LIDOCAINE HCL 2 % IJ SOLN
10.0000 mL | Freq: Once | INTRAMUSCULAR | Status: AC
  Administered 2017-06-09: 400 mg

## 2017-06-09 MED ORDER — MIDAZOLAM HCL 5 MG/5ML IJ SOLN
INTRAMUSCULAR | Status: AC
  Filled 2017-06-09: qty 5

## 2017-06-09 MED ORDER — TRIAMCINOLONE ACETONIDE 40 MG/ML IJ SUSP
INTRAMUSCULAR | Status: AC
  Filled 2017-06-09: qty 1

## 2017-06-09 MED ORDER — LACTATED RINGERS IV SOLN
1000.0000 mL | Freq: Once | INTRAVENOUS | Status: AC
  Administered 2017-06-09: 1000 mL via INTRAVENOUS

## 2017-06-09 MED ORDER — ROPIVACAINE HCL 2 MG/ML IJ SOLN
INTRAMUSCULAR | Status: AC
  Filled 2017-06-09: qty 10

## 2017-06-09 MED ORDER — OXYCODONE-ACETAMINOPHEN 5-325 MG PO TABS: 1.0000 | ORAL_TABLET | Freq: Four times a day (QID) | ORAL | 0 refills | Status: DC | PRN

## 2017-06-09 MED ORDER — ROPIVACAINE HCL 2 MG/ML IJ SOLN
9.0000 mL | Freq: Once | INTRAMUSCULAR | Status: AC
  Administered 2017-06-09: 9 mL via PERINEURAL

## 2017-06-09 MED ORDER — TRIAMCINOLONE ACETONIDE 40 MG/ML IJ SUSP
40.0000 mg | Freq: Once | INTRAMUSCULAR | Status: AC
  Administered 2017-06-09: 40 mg

## 2017-06-09 MED ORDER — FENTANYL CITRATE (PF) 100 MCG/2ML IJ SOLN
INTRAMUSCULAR | Status: AC
  Filled 2017-06-09: qty 2

## 2017-06-09 MED ORDER — MIDAZOLAM HCL 5 MG/5ML IJ SOLN
1.0000 mg | INTRAMUSCULAR | Status: DC | PRN
  Administered 2017-06-09: 2 mg via INTRAVENOUS

## 2017-06-09 MED ORDER — FENTANYL CITRATE (PF) 100 MCG/2ML IJ SOLN
25.0000 ug | INTRAMUSCULAR | Status: DC | PRN
  Administered 2017-06-09: 100 ug via INTRAVENOUS

## 2017-06-09 NOTE — Patient Instructions (Addendum)
____________________________________________________________________________________________  Post-Procedure instructions Instructions:  Apply ice: Fill a plastic sandwich bag with crushed ice. Cover it with a small towel and apply to injection site. Apply for 15 minutes then remove x 15 minutes. Repeat sequence on day of procedure, until you go to bed. The purpose is to minimize swelling and discomfort after procedure.  Apply heat: Apply heat to procedure site starting the day following the procedure. The purpose is to treat any soreness and discomfort from the procedure.  Food intake: Start with clear liquids (like water) and advance to regular food, as tolerated.   Physical activities: Keep activities to a minimum for the first 8 hours after the procedure.   Driving: If you have received any sedation, you are not allowed to drive for 24 hours after your procedure.  Blood thinner: Restart your blood thinner 6 hours after your procedure. (Only for those taking blood thinners)  Insulin: As soon as you can eat, you may resume your normal dosing schedule. (Only for those taking insulin)  Infection prevention: Keep procedure site clean and dry.  Post-procedure Pain Diary: Extremely important that this be done correctly and accurately. Recorded information will be used to determine the next step in treatment.  Pain evaluated is that of treated area only. Do not include pain from an untreated area.  Complete every hour, on the hour, for the initial 8 hours. Set an alarm to help you do this part accurately.  Do not go to sleep and have it completed later. It will not be accurate.  Follow-up appointment: Keep your follow-up appointment after the procedure. Usually 2 weeks for most procedures. (6 weeks in the case of radiofrequency.) Bring you pain diary.  Expect:  From numbing medicine (AKA: Local Anesthetics): Numbness or decrease in pain.  Onset: Full effect within 15 minutes of  injected.  Duration: It will depend on the type of local anesthetic used. On the average, 1 to 8 hours.   From steroids: Decrease in swelling or inflammation. Once inflammation is improved, relief of the pain will follow.  Onset of benefits: Depends on the amount of swelling present. The more swelling, the longer it will take for the benefits to be seen. In some cases, up to 10 days.  Duration: Steroids will stay in the system x 2 weeks. Duration of benefits will depend on multiple posibilities including persistent irritating factors.  From procedure: Some discomfort is to be expected once the numbing medicine wears off. This should be minimal if ice and heat are applied as instructed. Call if:  You experience numbness and weakness that gets worse with time, as opposed to wearing off.  New onset bowel or bladder incontinence. (Spinal procedures only)  Emergency Numbers:  Durning business hours (Monday - Thursday, 8:00 AM - 4:00 PM) (Friday, 9:00 AM - 12:00 Noon): [phone]  After hours: [phone] ____________________________________________________________________________________________   Pain Management Discharge Instructions  General Discharge Instructions :  If you need to reach your doctor call: Monday-Friday 8:00 am - 4:00 pm at [phone] or toll free [phone].  After clinic hours [phone] to have operator reach doctor.  Bring all of your medication bottles to all your appointments in the pain clinic.  To cancel or reschedule your appointment with Pain Management please remember to call 24 hours in advance to avoid a fee.  Refer to the educational materials which you have been given on: General Risks, I had my Procedure. Discharge Instructions, Post Sedation.  Post Procedure Instructions:  The drugs  you were given will stay in your system until tomorrow, so for the next 24 hours you should not drive, make any legal decisions or drink any alcoholic  beverages.  You may eat anything you prefer, but it is better to start with liquids then soups and crackers, and gradually work up to solid foods.  Please notify your doctor immediately if you have any unusual bleeding, trouble breathing or pain that is not related to your normal pain.  Depending on the type of procedure that was done, some parts of your body may feel week and/or numb.  This usually clears up by tonight or the next day.  Walk with the use of an assistive device or accompanied by an adult for the 24 hours.  You may use ice on the affected area for the first 24 hours.  Put ice in a Ziploc bag and cover with a towel and place against area 15 minutes on 15 minutes off.  You may switch to heat after 24 hours.Radiofrequency Lesioning Radiofrequency lesioning is a procedure that is performed to relieve pain. The procedure is often used for back, neck, or arm pain. Radiofrequency lesioning involves the use of a machine that creates radio waves to make heat. During the procedure, the heat is applied to the nerve that carries the pain signal. The heat damages the nerve and interferes with the pain signal. Pain relief usually starts about 2 weeks after the procedure and lasts for 6 months to 1 year. Tell a health care provider about:  Any allergies you have.  All medicines you are taking, including vitamins, herbs, eye drops, creams, and over-the-counter medicines.  Any problems you or family members have had with anesthetic medicines.  Any blood disorders you have.  Any surgeries you have had.  Any medical conditions you have.  Whether you are pregnant or may be pregnant. What are the risks? Generally, this is a safe procedure. However, problems may occur, including:  Pain or soreness at the injection site.  Infection at the injection site.  Damage to nerves or blood vessels.  What happens before the procedure?  Ask your health care provider about: ? Changing or stopping  your regular medicines. This is especially important if you are taking diabetes medicines or blood thinners. ? Taking medicines such as aspirin and ibuprofen. These medicines can thin your blood. Do not take these medicines before your procedure if your health care provider instructs you not to.  Follow instructions from your health care provider about eating or drinking restrictions.  Plan to have someone take you home after the procedure.  If you go home right after the procedure, plan to have someone with you for 24 hours. What happens during the procedure?  You will be given one or more of the following: ? A medicine to help you relax (sedative). ? A medicine to numb the area (local anesthetic).  You will be awake during the procedure. You will need to be able to talk with the health care provider during the procedure.  With the help of a type of X-ray (fluoroscopy), the health care provider will insert a radiofrequency needle into the area to be treated.  Next, a wire that carries the radio waves (electrode) will be put through the radiofrequency needle. An electrical pulse will be sent through the electrode to verify the correct nerve. You will feel a tingling sensation, and you may have muscle twitching.  Then, the tissue that is around the needle tip will  be heated by an electric current that is passed using the radiofrequency machine. This will numb the nerves.  A bandage (dressing) will be put on the insertion area after the procedure is done. The procedure may vary among health care providers and hospitals. What happens after the procedure?  Your blood pressure, heart rate, breathing rate, and blood oxygen level will be monitored often until the medicines you were given have worn off.  Return to your normal activities as directed by your health care provider. This information is not intended to replace advice given to you by your health care provider. Make sure you discuss any  questions you have with your health care provider. Document Released: 03/12/2011 Document Revised: 12/20/2015 Document Reviewed: 08/21/2014 [employer] Interactive Patient Education  [employer].

## 2017-06-09 NOTE — Progress Notes (Signed)
Safety precautions to be maintained throughout the outpatient stay will include: orient to surroundings, keep bed in low position, maintain call bell within reach at all times, provide assistance with transfer out of bed and ambulation.  

## 2017-06-09 NOTE — Progress Notes (Signed)
Patient's Name: Stanley Rios.  MRN: [id number]  Referring Provider: Milinda Pointer, MD  DOB: May 03, 1956  PCP: Marygrace Drought, MD  DOS: 06/09/2017  Note by: Gaspar Cola, MD  Service setting: Ambulatory outpatient  Specialty: Interventional Pain Management  Patient type: Established  Location: [facility] (AMB) Pain Management Facility  Visit type: Interventional Procedure   Primary Reason for Visit: Interventional Pain Management Treatment. CC: Back Pain (lower)  Procedure:  Anesthesia, Analgesia, Anxiolysis:  Type: Therapeutic Medial Branch Facet Radiofrequency Ablation Region: Lumbar Level: L2, L3, L4, L5, & S1 Medial Branch Level(s) Laterality: Left-Sided  Type: Local Anesthesia with Moderate (Conscious) Sedation Local Anesthetic: Lidocaine 1% Route: Intravenous (IV) IV Access: Secured Sedation: Meaningful verbal contact was maintained at all times during the procedure  Indication(s): Analgesia and Anxiety   Indications: 1. Lumbar facet syndrome (Bilateral) (R>L)   2. DDD (degenerative disc disease), lumbar   3. Chronic low back pain (Location of Primary Source of Pain) (Bilateral) (R>L)   4. Acute postoperative pain   5. Facet syndrome, lumbar   6. Chronic bilateral low back pain without sciatica    Stanley Rios has either failed to respond, was unable to tolerate, or simply did not get enough benefit from other more conservative therapies including, but not limited to: 1. Over-the-counter medications 2. Anti-inflammatory medications 3. Muscle relaxants 4. Membrane stabilizers 5. Opioids 6. Physical therapy 7. Modalities (Heat, ice, etc.) 8. Invasive techniques such as nerve blocks. Stanley Rios has attained more than 50% relief of the pain from a series of diagnostic injections conducted in separate occasions.  Pain Score: Pre-procedure: 4 /10 Post-procedure: 0-No pain/10  Pre-op Assessment:  Stanley Rios is a 61 y.o. (year old), male patient, seen today for  interventional treatment. He  has a past surgical history that includes Cholecystectomy; Spinal fusion; Neck surgery (2002 approx); and Hernia repair. Stanley Rios has a current medication list which includes the following prescription(s): acetaminophen, albuterol, bupropion, vitamin d3, clonazepam, cyanocobalamin, cyclobenzaprine, diltiazem, duloxetine, finasteride, fluticasone, fluticasone-salmeterol, furosemide, gabapentin, ibuprofen, ipratropium, ipratropium-albuterol, lidocaine, meloxicam, metoprolol tartrate, omeprazole, polyethylene glycol, potassium chloride er, prednisone, promethazine, tamsulosin, tiotropium, tramadol, trazodone, vitamin d (ergocalciferol), and oxycodone-acetaminophen, and the following Facility-Administered Medications: fentanyl and midazolam. His primarily concern today is the Back Pain (lower)  Initial Vital Signs: Blood pressure 102/70, pulse 96, temperature 97.7 F (36.5 C), temperature source Oral, resp. rate 16, height 5\' 11"  (1.803 m), weight 207 lb (93.9 kg), SpO2 96 %. BMI: Estimated body mass index is 28.87 kg/m as calculated from the following:   Height as of this encounter: 5\' 11"  (1.803 m).   Weight as of this encounter: 207 lb (93.9 kg).  Risk Assessment: Allergies: Reviewed. He has No Known Allergies.  Allergy Precautions: None required Coagulopathies: Reviewed. None identified.  Blood-thinner therapy: None at this time Active Infection(s): Reviewed. None identified. Stanley Rios is afebrile  Site Confirmation: Stanley Rios was asked to confirm the procedure and laterality before marking the site Procedure checklist: Completed Consent: Before the procedure and under the influence of no sedative(s), amnesic(s), or anxiolytics, the patient was informed of the treatment options, risks and possible complications. To fulfill our ethical and legal obligations, as recommended by the American Medical Association's Code of Ethics, I have informed the patient of my  clinical impression; the nature and purpose of the treatment or procedure; the risks, benefits, and possible complications of the intervention; the alternatives, including doing nothing; the risk(s) and benefit(s) of the alternative treatment(s) or procedure(s); and the risk(s) and  benefit(s) of doing nothing. The patient was provided information about the general risks and possible complications associated with the procedure. These may include, but are not limited to: failure to achieve desired goals, infection, bleeding, organ or nerve damage, allergic reactions, paralysis, and death. In addition, the patient was informed of those risks and complications associated to Spine-related procedures, such as failure to decrease pain; infection (i.e.: Meningitis, epidural or intraspinal abscess); bleeding (i.e.: epidural hematoma, subarachnoid hemorrhage, or any other type of intraspinal or peri-dural bleeding); organ or nerve damage (i.e.: Any type of peripheral nerve, nerve root, or spinal cord injury) with subsequent damage to sensory, motor, and/or autonomic systems, resulting in permanent pain, numbness, and/or weakness of one or several areas of the body; allergic reactions; (i.e.: anaphylactic reaction); and/or death. Furthermore, the patient was informed of those risks and complications associated with the medications. These include, but are not limited to: allergic reactions (i.e.: anaphylactic or anaphylactoid reaction(s)); adrenal axis suppression; blood sugar elevation that in diabetics may result in ketoacidosis or comma; water retention that in patients with history of congestive heart failure may result in shortness of breath, pulmonary edema, and decompensation with resultant heart failure; weight gain; swelling or edema; medication-induced neural toxicity; particulate matter embolism and blood vessel occlusion with resultant organ, and/or nervous system infarction; and/or aseptic necrosis of one or  more joints. Finally, the patient was informed that Medicine is not an exact science; therefore, there is also the possibility of unforeseen or unpredictable risks and/or possible complications that may result in a catastrophic outcome. The patient indicated having understood very clearly. We have given the patient no guarantees and we have made no promises. Enough time was given to the patient to ask questions, all of which were answered to the patient's satisfaction. Mr. Schultes has indicated that he wanted to continue with the procedure. Attestation: I, the ordering provider, attest that I have discussed with the patient the benefits, risks, side-effects, alternatives, likelihood of achieving goals, and potential problems during recovery for the procedure that I have provided informed consent. Date: 06/09/2017; Time: 3:22 PM  Pre-Procedure Preparation:  Monitoring: As per clinic protocol. Respiration, ETCO2, SpO2, BP, heart rate and rhythm monitor placed and checked for adequate function Safety Precautions: Patient was assessed for positional comfort and pressure points before starting the procedure. Time-out: I initiated and conducted the "Time-out" before starting the procedure, as per protocol. The patient was asked to participate by confirming the accuracy of the "Time Out" information. Verification of the correct person, site, and procedure were performed and confirmed by me, the nursing staff, and the patient. "Time-out" conducted as per Joint Commission's Universal Protocol (UP.01.01.01). "Time-out" Date & Time: 06/09/2017; 1531 hrs.  Description of Procedure Process:   Position: Prone Target Area: For Lumbar Facet blocks, the target is the groove formed by the junction of the transverse process and superior articular process. For the L5 dorsal ramus, the target is the notch between superior articular process and sacral ala. For the S1 dorsal ramus, the target is the superior and lateral edge  of the posterior S1 Sacral foramen. Approach: Paraspinal approach. Area Prepped: Entire Posterior Lumbosacral Region Prepping solution: Hibiclens (4.0% Chlorhexidine gluconate solution) Safety Precautions: Aspiration looking for blood return was conducted prior to all injections. At no point did we inject any substances, as a needle was being advanced. No attempts were made at seeking any paresthesias. Safe injection practices and needle disposal techniques used. Medications properly checked for expiration dates. SDV (single dose vial)  medications used. Description of the Procedure: Protocol guidelines were followed. The patient was placed in position over the procedure table. The target area was identified and the area prepped in the usual manner. The skin and muscle were infiltrated with local anesthetic. Appropriate amount of time allowed to pass for local anesthetics to take effect. Radiofrequency needles were introduced to the target area using fluoroscopic guidance. Using the NeuroTherm NT1100 Radiofrequency Generator, sensory stimulation using 50 Hz was used to locate & identify the nerve, making sure that the needle was positioned such that there was no sensory stimulation below 0.3 V or above 0.7 V. Stimulation using 2 Hz was used to evaluate the motor component. Care was taken not to lesion any nerves that demonstrated motor stimulation of the lower extremities at an output of less than 2.5 times that of the sensory threshold, or a maximum of 2.0 V. Once satisfactory placement of the needles was achieved, the numbing solution was slowly injected after negative aspiration. After waiting for at least 2 minutes, the ablation was performed at 80 degrees C for 60 seconds, using regular Radiofrequency settings. Once the procedure was completed, the needles were then removed and the area cleansed, making sure to leave some of the prepping solution back to take advantage of its long term bactericidal  properties. Intra-operative Compliance: Compliant  Illustration of the posterior view of the lumbar spine and the posterior neural structures. Laminae of L2 through S1 are labeled. DPRL5, dorsal primary ramus of L5; DPRS1, dorsal primary ramus of S1; DPR3, dorsal primary ramus of L3; FJ, facet (zygapophyseal) joint L3-L4; I, inferior articular process of L4; LB1, lateral branch of dorsal primary ramus of L1; IAB, inferior articular branches from L3 medial branch (supplies L4-L5 facet joint); IBP, intermediate branch plexus; MB3, medial branch of dorsal primary ramus of L3; NR3, third lumbar nerve root; S, superior articular process of L5; SAB, superior articular branches from L4 (supplies L4-5 facet joint also); TP3, transverse process of L3.  Vitals:   06/09/17 1605 06/09/17 1615 06/09/17 1625 06/09/17 1635  BP: 129/88 128/81 100/89 (!) 126/91  Pulse:      Resp: 12 14 18 19   Temp:      TempSrc:      SpO2: 93% 95% 96% 97%  Weight:      Height:        Start Time: 1532 hrs. End Time: 1605 hrs. Materials & Medications:  Needle(s) Type: Teflon-coated, curved tip, Radiofrequency needle(s) Gauge: 22G Length: 10cm Medication(s): We administered lactated ringers, midazolam, fentaNYL, lidocaine, triamcinolone acetonide, and ropivacaine (PF) 2 mg/mL (0.2%). Please see chart orders for dosing details.  Imaging Guidance (Spinal):  Type of Imaging Technique: Fluoroscopy Guidance (Spinal) Indication(s): Assistance in needle guidance and placement for procedures requiring needle placement in or near specific anatomical locations not easily accessible without such assistance. Exposure Time: Please see nurses notes. Contrast: None used. Fluoroscopic Guidance: I was personally present during the use of fluoroscopy. "Tunnel Vision Technique" used to obtain the best possible view of the target area. Parallax error corrected before commencing the procedure. "Direction-depth-direction" technique used to  introduce the needle under continuous pulsed fluoroscopy. Once target was reached, antero-posterior, oblique, and lateral fluoroscopic projection used confirm needle placement in all planes. Images permanently stored in EMR. Interpretation: No contrast injected. I personally interpreted the imaging intraoperatively. Adequate needle placement confirmed in multiple planes. Permanent images saved into the patient's record.  Antibiotic Prophylaxis:  Indication(s): None identified Antibiotic given: None  Post-operative Assessment:  EBL:  None Complications: No immediate post-treatment complications observed by team, or reported by patient. Note: The patient tolerated the entire procedure well. A repeat set of vitals were taken after the procedure and the patient was kept under observation following institutional policy, for this type of procedure. Post-procedural neurological assessment was performed, showing return to baseline, prior to discharge. The patient was provided with post-procedure discharge instructions, including a section on how to identify potential problems. Should any problems arise concerning this procedure, the patient was given instructions to immediately contact us, at any time, without hesitation. In any case, we plan to contact the patient by telephone for a follow-up status report regarding this interventional procedure. Comments:  No additional relevant information.  Plan of Care   Imaging Orders     DG C-Arm 1-60 Min-No Report Procedure Orders    No procedure(s) ordered today    Medications ordered for procedure: Meds ordered this encounter  Medications  . lactated ringers infusion 1,000 mL  . midazolam (VERSED) 5 MG/5ML injection 1-2 mg    Make sure Flumazenil is available in the pyxis when using this medication. If oversedation occurs, administer 0.2 mg IV over 15 sec. If after 45 sec no response, administer 0.2 mg again over 1 min; may repeat at 1 min intervals; not  to exceed 4 doses (1 mg)  . fentaNYL (SUBLIMAZE) injection 25-50 mcg    Make sure Narcan is available in the pyxis when using this medication. In the event of respiratory depression (RR< 8/min): Titrate NARCAN (naloxone) in increments of 0.1 to 0.2 mg IV at 2-3 minute intervals, until desired degree of reversal.  . lidocaine (XYLOCAINE) 2 % (with pres) injection 200 mg  . triamcinolone acetonide (KENALOG-40) injection 40 mg  . ropivacaine (PF) 2 mg/mL (0.2%) (NAROPIN) injection 9 mL  . oxyCODONE-acetaminophen (PERCOCET) 5-325 MG tablet    Sig: Take 1 tablet every 6 (six) hours as needed for up to 7 days by mouth for severe pain.    Dispense:  28 tablet    Refill:  0    For acute post-operative pain. Not to be refilled. To last 7 days.   Medications administered: We administered lactated ringers, midazolam, fentaNYL, lidocaine, triamcinolone acetonide, and ropivacaine (PF) 2 mg/mL (0.2%).  See the medical record for exact dosing, route, and time of administration.  This SmartLink is deprecated. Use AVSMEDLIST instead to display the medication list for a patient. Disposition: Discharge home  Discharge Date & Time: 06/09/2017; 1640 hrs.   Physician-requested Follow-up: Return for post-RFA eval (6 wks)(w/ Dionisio David, NP). Future Appointments  Date Time Provider Elizabeth  07/29/2017 10:30 AM Vevelyn Francois, NP [facility] None  10/05/2017  9:00 AM Vevelyn Francois, NP [facility] None   Primary Care Physician: Marygrace Drought, MD Location: [facility] Note by: Gaspar Cola, MD Date: 06/09/2017; Time: 5:21 PM  Disclaimer:  Medicine is not an Chief Strategy Officer. The only guarantee in medicine is that nothing is guaranteed. It is important to note that the decision to proceed with this intervention was based on the information collected from the patient. The Data and conclusions were drawn from the patient's questionnaire, the interview, and the physical  examination. Because the information was provided in large part by the patient, it cannot be guaranteed that it has not been purposely or unconsciously manipulated. Every effort has been made to obtain as much relevant data as possible for this evaluation. It is important to note that the conclusions that  lead to this procedure are derived in large part from the available data. Always take into account that the treatment will also be dependent on availability of resources and existing treatment guidelines, considered by other Pain Management Practitioners as being common knowledge and practice, at the time of the intervention. For Medico-Legal purposes, it is also important to point out that variation in procedural techniques and pharmacological choices are the acceptable norm. The indications, contraindications, technique, and results of the above procedure should only be interpreted and judged by a Board-Certified Interventional Pain Specialist with extensive familiarity and expertise in the same exact procedure and technique.

## 2017-06-10 ENCOUNTER — Telehealth: Payer: Self-pay | Admitting: *Deleted

## 2017-06-10 NOTE — Telephone Encounter (Signed)
Attempted to call for post procedure follow-up. Unable to leave a message.

## 2017-07-15 DIAGNOSIS — M549 Dorsalgia, unspecified: Secondary | ICD-10-CM | POA: Diagnosis not present

## 2017-07-15 DIAGNOSIS — Z79899 Other long term (current) drug therapy: Secondary | ICD-10-CM | POA: Diagnosis not present

## 2017-07-15 DIAGNOSIS — K469 Unspecified abdominal hernia without obstruction or gangrene: Secondary | ICD-10-CM | POA: Diagnosis not present

## 2017-07-15 DIAGNOSIS — R918 Other nonspecific abnormal finding of lung field: Secondary | ICD-10-CM | POA: Diagnosis not present

## 2017-07-15 DIAGNOSIS — I10 Essential (primary) hypertension: Secondary | ICD-10-CM | POA: Diagnosis not present

## 2017-07-15 DIAGNOSIS — Z7951 Long term (current) use of inhaled steroids: Secondary | ICD-10-CM | POA: Diagnosis not present

## 2017-07-15 DIAGNOSIS — R911 Solitary pulmonary nodule: Secondary | ICD-10-CM | POA: Diagnosis not present

## 2017-07-15 DIAGNOSIS — Z911 Patient's noncompliance with medical treatment and regimen: Secondary | ICD-10-CM | POA: Diagnosis not present

## 2017-07-15 DIAGNOSIS — F329 Major depressive disorder, single episode, unspecified: Secondary | ICD-10-CM | POA: Diagnosis not present

## 2017-07-15 DIAGNOSIS — Z510 Encounter for antineoplastic radiation therapy: Secondary | ICD-10-CM | POA: Diagnosis not present

## 2017-07-15 DIAGNOSIS — C3411 Malignant neoplasm of upper lobe, right bronchus or lung: Secondary | ICD-10-CM | POA: Diagnosis not present

## 2017-07-15 DIAGNOSIS — Z9889 Other specified postprocedural states: Secondary | ICD-10-CM | POA: Diagnosis not present

## 2017-07-15 DIAGNOSIS — J449 Chronic obstructive pulmonary disease, unspecified: Secondary | ICD-10-CM | POA: Diagnosis not present

## 2017-07-15 DIAGNOSIS — Z79891 Long term (current) use of opiate analgesic: Secondary | ICD-10-CM | POA: Diagnosis not present

## 2017-07-15 DIAGNOSIS — Z8249 Family history of ischemic heart disease and other diseases of the circulatory system: Secondary | ICD-10-CM | POA: Diagnosis not present

## 2017-07-15 DIAGNOSIS — F419 Anxiety disorder, unspecified: Secondary | ICD-10-CM | POA: Diagnosis not present

## 2017-07-15 DIAGNOSIS — G8929 Other chronic pain: Secondary | ICD-10-CM | POA: Diagnosis not present

## 2017-07-15 DIAGNOSIS — M1990 Unspecified osteoarthritis, unspecified site: Secondary | ICD-10-CM | POA: Diagnosis not present

## 2017-07-15 DIAGNOSIS — Z87891 Personal history of nicotine dependence: Secondary | ICD-10-CM | POA: Diagnosis not present

## 2017-07-27 ENCOUNTER — Other Ambulatory Visit: Payer: Self-pay | Admitting: Pain Medicine

## 2017-07-27 DIAGNOSIS — M150 Primary generalized (osteo)arthritis: Principal | ICD-10-CM

## 2017-07-27 DIAGNOSIS — M159 Polyosteoarthritis, unspecified: Secondary | ICD-10-CM

## 2017-07-29 ENCOUNTER — Ambulatory Visit: Payer: PPO | Admitting: Nurse Practitioner

## 2017-07-30 ENCOUNTER — Ambulatory Visit: Payer: PPO | Attending: Nurse Practitioner | Admitting: Nurse Practitioner

## 2017-07-30 ENCOUNTER — Encounter: Payer: Self-pay | Admitting: Nurse Practitioner

## 2017-07-30 ENCOUNTER — Other Ambulatory Visit: Payer: Self-pay

## 2017-07-30 VITALS — BP 118/70 | HR 124 | Temp 97.8°F | Ht 71.0 in | Wt 212.0 lb

## 2017-07-30 DIAGNOSIS — Z87891 Personal history of nicotine dependence: Secondary | ICD-10-CM | POA: Insufficient documentation

## 2017-07-30 DIAGNOSIS — R0789 Other chest pain: Secondary | ICD-10-CM | POA: Diagnosis not present

## 2017-07-30 DIAGNOSIS — Z981 Arthrodesis status: Secondary | ICD-10-CM | POA: Insufficient documentation

## 2017-07-30 DIAGNOSIS — M5136 Other intervertebral disc degeneration, lumbar region: Secondary | ICD-10-CM | POA: Diagnosis not present

## 2017-07-30 DIAGNOSIS — K429 Umbilical hernia without obstruction or gangrene: Secondary | ICD-10-CM | POA: Diagnosis not present

## 2017-07-30 DIAGNOSIS — M546 Pain in thoracic spine: Secondary | ICD-10-CM | POA: Insufficient documentation

## 2017-07-30 DIAGNOSIS — M79605 Pain in left leg: Secondary | ICD-10-CM | POA: Diagnosis not present

## 2017-07-30 DIAGNOSIS — M4802 Spinal stenosis, cervical region: Secondary | ICD-10-CM | POA: Insufficient documentation

## 2017-07-30 DIAGNOSIS — F329 Major depressive disorder, single episode, unspecified: Secondary | ICD-10-CM | POA: Insufficient documentation

## 2017-07-30 DIAGNOSIS — L821 Other seborrheic keratosis: Secondary | ICD-10-CM | POA: Insufficient documentation

## 2017-07-30 DIAGNOSIS — H269 Unspecified cataract: Secondary | ICD-10-CM | POA: Insufficient documentation

## 2017-07-30 DIAGNOSIS — M961 Postlaminectomy syndrome, not elsewhere classified: Secondary | ICD-10-CM | POA: Diagnosis not present

## 2017-07-30 DIAGNOSIS — K219 Gastro-esophageal reflux disease without esophagitis: Secondary | ICD-10-CM | POA: Insufficient documentation

## 2017-07-30 DIAGNOSIS — K449 Diaphragmatic hernia without obstruction or gangrene: Secondary | ICD-10-CM | POA: Insufficient documentation

## 2017-07-30 DIAGNOSIS — G588 Other specified mononeuropathies: Secondary | ICD-10-CM

## 2017-07-30 DIAGNOSIS — E785 Hyperlipidemia, unspecified: Secondary | ICD-10-CM | POA: Insufficient documentation

## 2017-07-30 DIAGNOSIS — J9611 Chronic respiratory failure with hypoxia: Secondary | ICD-10-CM | POA: Insufficient documentation

## 2017-07-30 DIAGNOSIS — M79604 Pain in right leg: Secondary | ICD-10-CM | POA: Insufficient documentation

## 2017-07-30 DIAGNOSIS — M159 Polyosteoarthritis, unspecified: Secondary | ICD-10-CM

## 2017-07-30 DIAGNOSIS — R911 Solitary pulmonary nodule: Secondary | ICD-10-CM | POA: Insufficient documentation

## 2017-07-30 DIAGNOSIS — M545 Low back pain, unspecified: Secondary | ICD-10-CM

## 2017-07-30 DIAGNOSIS — G894 Chronic pain syndrome: Secondary | ICD-10-CM

## 2017-07-30 DIAGNOSIS — M7918 Myalgia, other site: Secondary | ICD-10-CM | POA: Insufficient documentation

## 2017-07-30 DIAGNOSIS — M150 Primary generalized (osteo)arthritis: Secondary | ICD-10-CM | POA: Diagnosis not present

## 2017-07-30 DIAGNOSIS — F419 Anxiety disorder, unspecified: Secondary | ICD-10-CM | POA: Insufficient documentation

## 2017-07-30 DIAGNOSIS — E559 Vitamin D deficiency, unspecified: Secondary | ICD-10-CM | POA: Insufficient documentation

## 2017-07-30 DIAGNOSIS — M542 Cervicalgia: Secondary | ICD-10-CM

## 2017-07-30 DIAGNOSIS — G4700 Insomnia, unspecified: Secondary | ICD-10-CM | POA: Insufficient documentation

## 2017-07-30 DIAGNOSIS — G8929 Other chronic pain: Secondary | ICD-10-CM | POA: Diagnosis not present

## 2017-07-30 DIAGNOSIS — Z79891 Long term (current) use of opiate analgesic: Secondary | ICD-10-CM | POA: Insufficient documentation

## 2017-07-30 DIAGNOSIS — J449 Chronic obstructive pulmonary disease, unspecified: Secondary | ICD-10-CM | POA: Insufficient documentation

## 2017-07-30 DIAGNOSIS — G8918 Other acute postprocedural pain: Secondary | ICD-10-CM | POA: Insufficient documentation

## 2017-07-30 DIAGNOSIS — G548 Other nerve root and plexus disorders: Secondary | ICD-10-CM

## 2017-07-30 DIAGNOSIS — M479 Spondylosis, unspecified: Secondary | ICD-10-CM | POA: Insufficient documentation

## 2017-07-30 DIAGNOSIS — E538 Deficiency of other specified B group vitamins: Secondary | ICD-10-CM | POA: Diagnosis not present

## 2017-07-30 DIAGNOSIS — K589 Irritable bowel syndrome without diarrhea: Secondary | ICD-10-CM | POA: Diagnosis not present

## 2017-07-30 DIAGNOSIS — Z79899 Other long term (current) drug therapy: Secondary | ICD-10-CM | POA: Insufficient documentation

## 2017-07-30 DIAGNOSIS — I10 Essential (primary) hypertension: Secondary | ICD-10-CM | POA: Insufficient documentation

## 2017-07-30 DIAGNOSIS — M533 Sacrococcygeal disorders, not elsewhere classified: Secondary | ICD-10-CM | POA: Insufficient documentation

## 2017-07-30 DIAGNOSIS — D72828 Other elevated white blood cell count: Secondary | ICD-10-CM | POA: Insufficient documentation

## 2017-07-30 MED ORDER — DICLOFENAC SODIUM 1 % TD GEL: 4.0000 g | Freq: Four times a day (QID) | TRANSDERMAL | 5 refills | Status: AC

## 2017-07-30 MED ORDER — MELOXICAM 15 MG PO TABS
15.0000 mg | ORAL_TABLET | Freq: Every day | ORAL | 0 refills | Status: DC
Start: 2017-07-30 — End: 2018-01-25

## 2017-07-30 MED ORDER — CYCLOBENZAPRINE HCL 10 MG PO TABS: 10.0000 mg | ORAL_TABLET | Freq: Three times a day (TID) | ORAL | 0 refills | Status: DC | PRN

## 2017-07-30 MED ORDER — TRAMADOL HCL 50 MG PO TABS: 50.0000 mg | ORAL_TABLET | Freq: Four times a day (QID) | ORAL | 5 refills | Status: DC | PRN

## 2017-07-30 NOTE — Progress Notes (Signed)
Nursing Pain Medication Assessment:  Safety precautions to be maintained throughout the outpatient stay will include: orient to surroundings, keep bed in low position, maintain call bell within reach at all times, provide assistance with transfer out of bed and ambulation.  Medication Inspection Compliance: Pill count conducted under aseptic conditions, in front of the patient. Neither the pills nor the bottle was removed from the patient's sight at any time. Once count was completed pills were immediately returned to the patient in their original bottle.  Medication: Tramadol (Ultram) Pill/Patch Count: 0 of 120 pills remain Pill/Patch Appearance: Markings consistent with prescribed medication Bottle Appearance: Standard pharmacy container. Clearly labeled. Filled Date: 83 / 03 / 2018 Last Medication intake:  Last night

## 2017-07-30 NOTE — Patient Instructions (Addendum)
You were given a script for Tramadol today with 5 refills.  You have been instructed to get UDS.  You have scripts to be picked up at the pharmacy today.  ____________________________________________________________________________________________  Medication Rules  Applies to: All patients receiving prescriptions (written or electronic).  Pharmacy of record: Pharmacy where electronic prescriptions will be sent. If written prescriptions are taken to a different pharmacy, please inform the nursing staff. The pharmacy listed in the electronic medical record should be the one where you would like electronic prescriptions to be sent.  Prescription refills: Only during scheduled appointments. Applies to both, written and electronic prescriptions.  NOTE: The following applies primarily to controlled substances (Opioid* Pain Medications).   Patient's responsibilities: 1. Pain Pills: Bring all pain pills to every appointment (except for procedure appointments). 2. Pill Bottles: Bring pills in original pharmacy bottle. Always bring newest bottle. Bring bottle, even if empty. 3. Medication refills: You are responsible for knowing and keeping track of what medications you need refilled. The day before your appointment, write a list of all prescriptions that need to be refilled. Bring that list to your appointment and give it to the admitting nurse. Prescriptions will be written only during appointments. If you forget a medication, it will not be "Called in", "Faxed", or "electronically sent". You will need to get another appointment to get these prescribed. 4. Prescription Accuracy: You are responsible for carefully inspecting your prescriptions before leaving our office. Have the discharge nurse carefully go over each prescription with you, before taking them home. Make sure that your name is accurately spelled, that your address is correct. Check the name and dose of your medication to make sure it is  accurate. Check the number of pills, and the written instructions to make sure they are clear and accurate. Make sure that you are given enough medication to last until your next medication refill appointment. 5. Taking Medication: Take medication as prescribed. Never take more pills than instructed. Never take medication more frequently than prescribed. Taking less pills or less frequently is permitted and encouraged, when it comes to controlled substances (written prescriptions).  6. Inform other Doctors: Always inform, all of your healthcare providers, of all the medications you take. 7. Pain Medication from other Providers: You are not allowed to accept any additional pain medication from any other Doctor or Healthcare provider. There are two exceptions to this rule. (see below) In the event that you require additional pain medication, you are responsible for notifying us, as stated below. 8. Medication Agreement: You are responsible for carefully reading and following our Medication Agreement. This must be signed before receiving any prescriptions from our practice. Safely store a copy of your signed Agreement. Violations to the Agreement will result in no further prescriptions. (Additional copies of our Medication Agreement are available upon request.) 9. Laws, Rules, & Regulations: All patients are expected to follow all Federal and [employer], [employer], Rules, [employer]. Ignorance of the Laws does not constitute a valid excuse. The use of any illegal substances is prohibited. 10. Adopted CDC guidelines & recommendations: Target dosing levels will be at or below 60 MME/day. Use of benzodiazepines** is not recommended.  Exceptions: There are only two exceptions to the rule of not receiving pain medications from other Healthcare Providers. 1. Exception #1 (Emergencies): In the event of an emergency (i.e.: accident requiring emergency care), you are allowed to receive additional pain medication.  However, you are responsible for: As soon as you are able, call our office [phone], at any time of the day or night, and leave a message stating your name, the date and nature of the emergency, and the name and dose of the medication prescribed. In the event that your call is answered by a member of our staff, make sure to document and save the date, time, and the name of the person that took your information.  2. Exception #2 (Planned Surgery): In the event that you are scheduled by another doctor or dentist to have any type of surgery or procedure, you are allowed (for a period no longer than 30 days), to receive additional pain medication, for the acute post-op pain. However, in this case, you are responsible for picking up a copy of our "Post-op Pain Management for Surgeons" handout, and giving it to your surgeon or dentist. This document is available at our office, and does not require an appointment to obtain it. Simply go to our office during business hours (Monday-Thursday from 8:00 AM to 4:00 PM) (Friday 8:00 AM to 12:00 Noon) or if you have a scheduled appointment with [place], prior to your surgery, and ask for it by name. In addition, you will need to provide [place] with your name, name of your surgeon, type of surgery, and date of procedure or surgery.  *Opioid medications include: morphine, codeine, oxycodone, oxymorphone, hydrocodone, hydromorphone, meperidine, tramadol, tapentadol, buprenorphine, fentanyl, methadone. **Benzodiazepine medications include: diazepam (Valium), alprazolam (Xanax), clonazepam (Klonopine), lorazepam (Ativan), clorazepate (Tranxene), chlordiazepoxide (Librium), estazolam (Prosom), oxazepam (Serax), temazepam (Restoril), triazolam (Halcion)  ____________________________________________________________________________________________

## 2017-07-30 NOTE — Progress Notes (Signed)
Patient's Name: Stanley Rios.  MRN: [id number]  Referring Provider: Marygrace Drought, MD  DOB: [birth year]  PCP: Marygrace Drought, MD  DOS: 07/30/2017  Note by: Vevelyn Francois NP  Service setting: Ambulatory outpatient  Specialty: Interventional Pain Management  Location: [facility] (AMB) Pain Management Facility    Patient type: Established    Primary Reason(s) for Visit: Encounter for prescription drug management & post-procedure evaluation of chronic illness with mild to moderate exacerbation(Level of risk: moderate) CC: Back Pain (middle)  HPI  Stanley Rios is a 62 y.o. year old, male patient, who comes today for a post-procedure evaluation and medication management. He has Opiate withdrawal (HCC); COPD (chronic obstructive pulmonary disease) ([place]); HTN (hypertension); Depression; Anxiety; Neutrophilic leukocytosis; DDD (degenerative disc disease), lumbar; Lumbar facet syndrome (Bilateral) (R>L); Sacroiliac joint pain (Bilateral) (R>L); DDD (degenerative disc disease), cervical; Cervical facet syndrome; Pressure injury of skin; Chronic airway obstruction ([place]); Chronic pain syndrome; Depressive disorder; Dry eye syndrome; Esophageal reflux; Hypertension; Insomnia; Irritable bowel syndrome (IBS); Chronic low back pain (Location of Primary Source of Pain) (Bilateral) (R>L); Posterior subcapsular cataract, bilateral; Cervical post-laminectomy syndrome; Pulmonary emphysema ([place]); Seborrheic keratoses; Spinal stenosis in cervical region; Testosterone deficiency; Tobacco use disorder; Umbilical hernia; Vitamin D deficiency; Vitreomacular adhesion of right eye; Long term current use of opiate analgesic; Opiate use; Long term prescription opiate use; Chronic hip pain; Chronic shoulder pain (Location of Tertiary source of pain) (Bilateral) (R>L); Lower extremity weakness; Chronic neck pain (Location of Secondary source of pain) (Bilateral) (R>L); Chronic lower extremity pain (Bilateral) (R>L); B12 deficiency;  Osteoarthritis; Neurogenic pain; Musculoskeletal pain; Diaphragmatic hernia; Chronic hypoxemic respiratory failure ([place]); Chronic narcotic use; Intercostal neuropathic pain (Left); Post-thoracotomy pain syndrome (Left); Intercostal neuralgia (Left); Claudication ([place]); Chronic chest wall pain (Left); Nodule of right lung; and Acute postoperative pain on their problem list. His primarily concern today is the Back Pain (middle)  Pain Assessment: Location: Mid, Lower Back Radiating: Denies Duration: Chronic pain Quality: Nagging, Discomfort Severity: 4 /10 (self-reported pain score)  Note: Reported level is compatible with observation.                          Timing: Constant Modifying factors: Meds, resting  Stanley Rios was last seen on 06/09/17 for a procedure. During today's appointment we reviewed Stanley Rios post-procedure results, as well as his outpatient medication regimen. He states that his pain is higher up on the back. He states that bending over making the bed or running the vacuum. He states that the pain is making him cry. He denies any strenous activity or injury. His wife later admits that he had abdominal hernia and suffered complication with the mesh used. He is in need of a revision but secondary to his respiratory status this has been on hold. She admits that he will follow up with the pulmonologist in February. They aslo admits that the previous NB to this are was not effective. However they would like to further evaluate their options.   Further details on both, my assessment(s), as well as the proposed treatment plan, please see below.  Controlled Substance Pharmacotherapy Assessment REMS (Risk Evaluation and Mitigation Strategy)  Analgesic:Tramadol 50 mg 1 tablet every 6 hours (200 mg/dayof tramadol) MME/day:37m/day.    BChauncey Fischer RN  07/30/2017 11:04 AM  Sign at close encounter Nursing Pain Medication Assessment:  Safety precautions to be maintained throughout  the outpatient stay will include: orient to surroundings, keep bed in low position, maintain  call bell within reach at all times, provide assistance with transfer out of bed and ambulation.  Medication Inspection Compliance: Pill count conducted under aseptic conditions, in front of the patient. Neither the pills nor the bottle was removed from the patient's sight at any time. Once count was completed pills were immediately returned to the patient in their original bottle.  Medication: Tramadol (Ultram) Pill/Patch Count: 0 of 120 pills remain Pill/Patch Appearance: Markings consistent with prescribed medication Bottle Appearance: Standard pharmacy container. Clearly labeled. Filled Date: 71 / 03 / 2018 Last Medication intake:  Last night   Pharmacokinetics: Liberation and absorption (onset of action): WNL Distribution (time to peak effect): WNL Metabolism and excretion (duration of action): WNL         Pharmacodynamics: Desired effects: Analgesia: Stanley Rios reports >50% benefit. Functional ability: Patient reports that medication allows him to accomplish basic ADLs Clinically meaningful improvement in function (CMIF): Sustained CMIF goals met Perceived effectiveness: Described as relatively effective, allowing for increase in activities of daily living (ADL) Undesirable effects: Side-effects or Adverse reactions: None reported Monitoring: [place] PMP: Online review of the past 6-monthperiod conducted. Compliant with practice rules and regulations Last UDS on record: Summary  Date Value Ref Range Status  08/05/2016 FINAL  Final    Comment:    ==================================================================== TOXASSURE COMP DRUG ANALYSIS,UR ==================================================================== Test                             Result       Flag       Units Drug Present and Declared for Prescription Verification   Gabapentin                     PRESENT      EXPECTED    Cyclobenzaprine                PRESENT      EXPECTED   Bupropion                      PRESENT      EXPECTED   Duloxetine                     PRESENT      EXPECTED   Trazodone                      PRESENT      EXPECTED   1,3 chlorophenyl piperazine    PRESENT      EXPECTED    1,3-chlorophenyl piperazine is an expected metabolite of    trazodone.   Naproxen                       PRESENT      EXPECTED   Diltiazem                      PRESENT      EXPECTED   Metoprolol                     PRESENT      EXPECTED Drug Present not Declared for Prescription Verification   Tramadol                       PRESENT      UNEXPECTED   O-Desmethyltramadol  PRESENT      UNEXPECTED   N-Desmethyltramadol            PRESENT      UNEXPECTED    Source of tramadol is a prescription medication.    O-desmethyltramadol and N-desmethyltramadol are expected    metabolites of tramadol.   Methocarbamol                  PRESENT      UNEXPECTED   Acetaminophen                  PRESENT      UNEXPECTED   Ibuprofen                      PRESENT      UNEXPECTED Drug Absent but Declared for Prescription Verification   Baclofen                       Not Detected UNEXPECTED   Lidocaine                      Not Detected UNEXPECTED    Lidocaine, as indicated in the declared medication list, is not    always detected even when used as directed. ==================================================================== Test                      Result    Flag   Units      Ref Range   Creatinine              200              mg/dL      >=20 ==================================================================== Declared Medications:  The flagging and interpretation on this report are based on the  following declared medications.  Unexpected results may arise from  inaccuracies in the declared medications.  **Note: The testing scope of this panel includes these medications:  Baclofen  Bupropion (Wellbutrin)  Cyclobenzaprine  (Flexeril)  Diltiazem  Duloxetine (Cymbalta)  Gabapentin  Metoprolol (Lopressor)  Naproxen  Trazodone  **Note: The testing scope of this panel does not include small to  moderate amounts of these reported medications:  Lidocaine  **Note: The testing scope of this panel does not include following  reported medications:  Albuterol  Albuterol (Duoneb)  Celecoxib (Celebrex)  Enalapril (Vasotec)  Finasteride (Proscar)  Fluticasone (Advair)  Fluticasone (Flonase)  Furosemide (Lasix)  Ipratropium (Atrovent)  Ipratropium (Duoneb)  Naloxone (Narcan)  Nicotine  Omeprazole  Polyethylene Glycol  Potassium (K-Dur)  Potassium (Klor-Con)  Prednisone (Deltasone)  Salmeterol (Advair)  Simvastatin (Zocor)  Tamsulosin (Flomax)  Tiotropium (Spiriva)  Vitamin D2 (Ergocalciferol) ==================================================================== For clinical consultation, please call [phone]. ====================================================================    UDS interpretation: Compliant          Medication Assessment Form: Reviewed. Patient indicates being compliant with therapy Treatment compliance: Compliant Risk Assessment Profile: Aberrant behavior: See prior evaluations. None observed or detected today Comorbid factors increasing risk of overdose: See prior notes. No additional risks detected today Risk of substance use disorder (SUD): Low  ORT Scoring interpretation table:  Score <3 = Low Risk for SUD  Score between 4-7 = Moderate Risk for SUD  Score >8 = High Risk for Opioid Abuse   Risk Mitigation Strategies:  Patient Counseling: Covered Patient-Prescriber Agreement (PPA): Present and active  Notification to other healthcare providers: Done  Pharmacologic Plan: No change in therapy, at this time.  Post-Procedure Assessment  11/143 /18 Procedure: left sided lumbar RFA Pre-procedure pain score:  4/10 Post-procedure pain score: 0/10          Influential Factors: BMI: 29.57 kg/m Intra-procedural challenges: None observed.         Assessment challenges: None detected.              Reported side-effects: None.        Post-procedural adverse reactions or complications: None reported         Sedation: Please see nurses note. When no sedatives are used, the analgesic levels obtained are directly associated to the effectiveness of the local anesthetics. However, when sedation is provided, the level of analgesia obtained during the initial 1 hour following the intervention, is believed to be the result of a combination of factors. These factors may include, but are not limited to: 1. The effectiveness of the local anesthetics used. 2. The effects of the analgesic(s) and/or anxiolytic(s) used. 3. The degree of discomfort experienced by the patient at the time of the procedure. 4. The patients ability and reliability in recalling and recording the events. 5. The presence and influence of possible secondary gains and/or psychosocial factors. Reported result: Relief experienced during the 1st hour after the procedure: 100 % (Ultra-Short Term Relief)            Interpretative annotation: Clinically appropriate result. Analgesia during this period is likely to be Local Anesthetic and/or IV Sedative (Analgesic/Anxiolytic) related.          Effects of local anesthetic: The analgesic effects attained during this period are directly associated to the localized infiltration of local anesthetics and therefore cary significant diagnostic value as to the etiological location, or anatomical origin, of the pain. Expected duration of relief is directly dependent on the pharmacodynamics of the local anesthetic used. Long-acting (4-6 hours) anesthetics used.  Reported result: Relief during the next 4 to 6 hour after the procedure: 100 % (Short-Term Relief)            Interpretative annotation: Clinically appropriate result. Analgesia during this period is  likely to be Local Anesthetic-related.          Long-term benefit: Defined as the period of time past the expected duration of local anesthetics (1 hour for short-acting and 4-6 hours for long-acting). With the possible exception of prolonged sympathetic blockade from the local anesthetics, benefits during this period are typically attributed to, or associated with, other factors such as analgesic sensory neuropraxia, antiinflammatory effects, or beneficial biochemical changes provided by agents other than the local anesthetics.  Reported result: Extended relief following procedure: 0 % (Long-Term Relief)            Interpretative annotation: Clinically appropriate result. Good relief. No permanent benefit expected. Inflammation plays a part in the etiology to the pain.          Current benefits: Defined as reported results that persistent at this point in time.   Analgesia: >50 %            Function: Somewhat improved ROM: Somewhat improved Interpretative annotation: Recurrence of symptoms. No permanent benefit expected. Adequate RF ablation.          Interpretation: Results would suggest adequate radiofrequency ablation.                  Plan:  Please see "Plan of Care" for details.        Laboratory Chemistry  Inflammation Markers (CRP: Acute Phase) (ESR: Chronic Phase)  Lab Results  Component Value Date   CRP <0.8 08/07/2016   ESRSEDRATE 24 (H) 08/07/2016   LATICACIDVEN 0.9 04/06/2016                 Rheumatology Markers No results found for: Elayne Guerin, [facility]              Renal Function Markers Lab Results  Component Value Date   BUN 12 08/07/2016   CREATININE 0.90 08/07/2016   GFRAA >60 08/07/2016   GFRNONAA >60 08/07/2016                 Hepatic Function Markers Lab Results  Component Value Date   AST 20 08/07/2016   ALT 16 (L) 08/07/2016   ALBUMIN 3.6 08/07/2016   ALKPHOS 80 08/07/2016   LIPASE 16 01/16/2016                  Electrolytes Lab Results  Component Value Date   NA 142 08/07/2016   K 3.6 08/07/2016   CL 108 08/07/2016   CALCIUM 8.9 08/07/2016   MG 1.7 08/07/2016   PHOS 3.0 04/06/2016                 Neuropathy Markers Lab Results  Component Value Date   VITAMINB12 176 (L) 08/07/2016   HIV NON REACTIVE 04/10/2016                 Bone Pathology Markers Lab Results  Component Value Date   25OHVITD1 27 (L) 08/07/2016   25OHVITD2 20 08/07/2016   25OHVITD3 7.0 08/07/2016                 Coagulation Parameters Lab Results  Component Value Date   INR 0.83 04/11/2016   LABPROT 11.4 04/11/2016   PLT 318 04/18/2016                 Cardiovascular Markers Lab Results  Component Value Date   TROPONINI <0.03 04/07/2016   HGB 12.5 (L) 04/18/2016   HCT 36.8 (L) 04/18/2016                 CA Markers No results found for: CEA, CA125, LABCA2               Note: Lab results reviewed.  Recent Diagnostic Imaging Results  DG C-Arm 1-60 Min-No Report Fluoroscopy was utilized by the requesting physician.  No radiographic  interpretation.   Complexity Note: Imaging results reviewed. Results shared with Stanley Rios, using Layman's terms.                         Meds   Current Outpatient Medications:  .  acetaminophen (TYLENOL) 500 MG tablet, Take 1,000 mg by mouth., Disp: , Rfl:  .  albuterol (PROVENTIL HFA;VENTOLIN HFA) 108 (90 Base) MCG/ACT inhaler, Inhale 2 puffs into the lungs every 4 (four) hours as needed for wheezing or shortness of breath. Reported on 10/26/2015, Disp: , Rfl:  .  buPROPion (WELLBUTRIN) 75 MG tablet, Take 75 mg by mouth 2 (two) times daily. Reported on 10/26/2015, Disp: , Rfl:  .  Cholecalciferol (VITAMIN D3) 2000 units capsule, TK 1 C PO D, Disp: , Rfl: 0 .  clonazePAM (KLONOPIN) 0.5 MG tablet, Take 0.5 mg by mouth., Disp: , Rfl:  .  cyclobenzaprine (FLEXERIL) 10 MG tablet, Take 1 tablet (10 mg total) by mouth 3 (three) times daily as needed for muscle spasms.,  Disp:  540 tablet, Rfl: 0 .  diltiazem (CARDIZEM SR) 90 MG 12 hr capsule, TAKE ONE CAPSULE BY MOUTH TWICE DAILY, Disp: , Rfl:  .  DULoxetine (CYMBALTA) 60 MG capsule, Take 60 mg by mouth daily. Reported on 10/26/2015, Disp: , Rfl:  .  finasteride (PROSCAR) 5 MG tablet, Take 5 mg by mouth daily., Disp: , Rfl:  .  fluticasone (FLONASE) 50 MCG/ACT nasal spray, SHAKE LIQUID AND USE 2 SPRAYS IN EACH NOSTRIL DAILY, Disp: , Rfl:  .  Fluticasone-Salmeterol (ADVAIR DISKUS) 250-50 MCG/DOSE AEPB, Inhale into the lungs., Disp: , Rfl:  .  furosemide (LASIX) 20 MG tablet, Take one or two daily as directed., Disp: , Rfl:  .  gabapentin (NEURONTIN) 800 MG tablet, Take by mouth., Disp: , Rfl:  .  ibuprofen (ADVIL,MOTRIN) 200 MG tablet, Take 400 mg by mouth., Disp: , Rfl:  .  ipratropium (ATROVENT HFA) 17 MCG/ACT inhaler, Inhale 2 puffs into the lungs every 6 (six) hours., Disp: , Rfl:  .  ipratropium-albuterol (DUONEB) 0.5-2.5 (3) MG/3ML SOLN, Take 3 mLs by nebulization every 4 (four) hours as needed., Disp: , Rfl:  .  meloxicam (MOBIC) 15 MG tablet, Take 1 tablet (15 mg total) by mouth daily., Disp: 180 tablet, Rfl: 0 .  metoprolol tartrate (LOPRESSOR) 25 MG tablet, Take 1 tablet (25 mg total) by mouth 2 (two) times daily., Disp: 60 tablet, Rfl: 0 .  omeprazole (PRILOSEC) 40 MG capsule, Take 40 mg by mouth 2 (two) times daily., Disp: , Rfl:  .  polyethylene glycol (MIRALAX / GLYCOLAX) packet, Take 17 g by mouth daily., Disp: , Rfl:  .  Potassium Chloride ER 20 MEQ TBCR, Take by mouth., Disp: , Rfl:  .  predniSONE (DELTASONE) 10 MG tablet, Take 1 tablet by mouth once., Disp: , Rfl:  .  promethazine (PHENERGAN) 25 MG tablet, Take 25 mg by mouth., Disp: , Rfl:  .  tamsulosin (FLOMAX) 0.4 MG CAPS capsule, Take 0.4 mg by mouth daily., Disp: , Rfl:  .  tiotropium (SPIRIVA HANDIHALER) 18 MCG inhalation capsule, Place into inhaler and inhale., Disp: , Rfl:  .  traMADol (ULTRAM) 50 MG tablet, Take 1 tablet (50 mg total) by  mouth every 6 (six) hours as needed., Disp: 120 tablet, Rfl: 5 .  traZODone (DESYREL) 50 MG tablet, Take 150 mg by mouth at bedtime., Disp: , Rfl:  .  Vitamin D, Ergocalciferol, (DRISDOL) 50000 units CAPS capsule, Take 1 capsule (50,000 Units total) by mouth 2 (two) times a week x 6 weeks., Disp: 12 capsule, Rfl: 0 .  Cyanocobalamin (VITAMELTS ENERGY VITAMIN B-12) 1500 MCG TBDP, Take 1 tablet by mouth daily., Disp: 180 tablet, Rfl: 0 .  diclofenac sodium (VOLTAREN) 1 % GEL, Apply 4 g topically 4 (four) times daily., Disp: 1 Tube, Rfl: 5 .  oxyCODONE-acetaminophen (PERCOCET) 5-325 MG tablet, Take 1 tablet every 6 (six) hours as needed for up to 7 days by mouth for severe pain., Disp: 28 tablet, Rfl: 0  ROS  Constitutional: Denies any fever or chills Gastrointestinal: No reported hemesis, hematochezia, vomiting, or acute GI distress Musculoskeletal: Denies any acute onset joint swelling, redness, loss of ROM, or weakness Neurological: No reported episodes of acute onset apraxia, aphasia, dysarthria, agnosia, amnesia, paralysis, loss of coordination, or loss of consciousness  Allergies  Stanley Rios has No Known Allergies.  [place]  Drug: Stanley Rios  reports that he uses drugs. Alcohol:  reports that he does not drink alcohol. Tobacco:  reports that he quit smoking about  14 months ago. His smoking use included cigarettes. He smoked 0.00 packs per day for 40.00 years. He has quit using smokeless tobacco. Medical:  has a past medical history of Anxiety, Chronic back pain, Chronic neck pain, COPD (chronic obstructive pulmonary disease) ([place]), Depression, Hyperlipidemia, Hypertension, Opiate abuse, continuous, and Pneumonia. Surgical: Stanley Rios  has a past surgical history that includes Cholecystectomy; Spinal fusion; Neck surgery (2002 approx); and Hernia repair. Family: family history includes Bladder Cancer in his father.  Constitutional Exam  General appearance: Well nourished, well developed, and  well hydrated. In no apparent acute distress Vitals:   07/30/17 1043  BP: 118/70  Pulse: (!) 124  Temp: 97.8 F (36.6 C)  SpO2: 98%  Weight: 212 lb (96.2 kg)  Height: 5' 11"  (1.803 m)   BMI Assessment: Estimated body mass index is 29.57 kg/m as calculated from the following:   Height as of this encounter: 5' 11"  (1.803 m).   Weight as of this encounter: 212 lb (96.2 kg).  BMI interpretation table: BMI level Category Range association with higher incidence of chronic pain  <18 kg/m2 Underweight   18.5-24.9 kg/m2 Ideal body weight   25-29.9 kg/m2 Overweight Increased incidence by 20%  30-34.9 kg/m2 Obese (Class I) Increased incidence by 68%  35-39.9 kg/m2 Severe obesity (Class II) Increased incidence by 136%  >40 kg/m2 Extreme obesity (Class III) Increased incidence by 254%   BMI Readings from Last 4 Encounters:  07/30/17 29.57 kg/m  06/09/17 28.87 kg/m  05/18/17 29.23 kg/m  04/21/17 29.01 kg/m   Wt Readings from Last 4 Encounters:  07/30/17 212 lb (96.2 kg)  06/09/17 207 lb (93.9 kg)  05/18/17 209 lb (94.8 kg)  04/21/17 208 lb (94.3 kg)  Psych/Mental status: Alert, oriented x 3 (person, place, & time)       Eyes: PERLA Respiratory: No evidence of acute respiratory distress  Cervical Spine Area Exam  Skin & Axial Inspection: No masses, redness, edema, swelling, or associated skin lesions Alignment: Symmetrical Functional ROM: Unrestricted ROM      Stability: No instability detected Muscle Tone/Strength: Functionally intact. No obvious neuro-muscular anomalies detected. Sensory (Neurological): Unimpaired Palpation: No palpable anomalies              Upper Extremity (UE) Exam    Side: Right upper extremity  Side: Left upper extremity  Skin & Extremity Inspection: Skin color, temperature, and hair growth are WNL. No peripheral edema or cyanosis. No masses, redness, swelling, asymmetry, or associated skin lesions. No contractures.  Skin & Extremity Inspection: Skin  color, temperature, and hair growth are WNL. No peripheral edema or cyanosis. No masses, redness, swelling, asymmetry, or associated skin lesions. No contractures.  Functional ROM: Unrestricted ROM          Functional ROM: Unrestricted ROM          Muscle Tone/Strength: Functionally intact. No obvious neuro-muscular anomalies detected.  Muscle Tone/Strength: Functionally intact. No obvious neuro-muscular anomalies detected.  Sensory (Neurological): Unimpaired          Sensory (Neurological): Unimpaired          Palpation: No palpable anomalies              Palpation: No palpable anomalies              Specialized Test(s): Deferred         Specialized Test(s): Deferred          Thoracic Spine Area Exam  Skin & Axial Inspection: left sided well healed  scar; abdominal and flank nodules Alignment: Asymmetric Functional ROM: Pain restricted ROM Stability: No instability detected Muscle Tone/Strength: Functionally intact. No obvious neuro-muscular anomalies detected. Sensory (Neurological): Unimpaired Muscle strength & Tone: Complains of area being tender to palpation  Lumbar Spine Area Exam  Skin & Axial Inspection: No masses, redness, or swelling Alignment: Symmetrical Functional ROM: Unrestricted ROM      Stability: No instability detected Muscle Tone/Strength: Functionally intact. No obvious neuro-muscular anomalies detected. Sensory (Neurological): Unimpaired Palpation: Complains of area being tender to palpation       Provocative Tests: Lumbar Hyperextension and rotation test: evaluation deferred today       Lumbar Lateral bending test: evaluation deferred today       Patrick's Maneuver: evaluation deferred today                    Gait & Posture Assessment  Ambulation: Unassisted Gait: Relatively normal for age and body habitus Posture: WNL   Lower Extremity Exam    Side: Right lower extremity  Side: Left lower extremity  Skin & Extremity Inspection: Skin color, temperature, and  hair growth are WNL. No peripheral edema or cyanosis. No masses, redness, swelling, asymmetry, or associated skin lesions. No contractures.  Skin & Extremity Inspection: Skin color, temperature, and hair growth are WNL. No peripheral edema or cyanosis. No masses, redness, swelling, asymmetry, or associated skin lesions. No contractures.  Functional ROM: Unrestricted ROM          Functional ROM: Unrestricted ROM          Muscle Tone/Strength: Functionally intact. No obvious neuro-muscular anomalies detected.  Muscle Tone/Strength: Functionally intact. No obvious neuro-muscular anomalies detected.  Sensory (Neurological): Unimpaired  Sensory (Neurological): Unimpaired  Palpation: No palpable anomalies  Palpation: No palpable anomalies   Assessment  Primary Diagnosis & Pertinent Problem List: The primary encounter diagnosis was Intercostal neuralgia (Left). Diagnoses of Intercostal neuropathic pain (Left), Chronic low back pain (Location of Primary Source of Pain) (Bilateral) (R>L), Chronic neck pain (Location of Secondary source of pain) (Bilateral) (R>L), Osteoarthritis, Musculoskeletal pain, Long term current use of opiate analgesic, and Chronic pain syndrome were also pertinent to this visit.  Status Diagnosis  Controlled Controlled Controlled 1. Intercostal neuralgia (Left)   2. Intercostal neuropathic pain (Left)   3. Chronic low back pain (Location of Primary Source of Pain) (Bilateral) (R>L)   4. Chronic neck pain (Location of Secondary source of pain) (Bilateral) (R>L)   5. Osteoarthritis   6. Musculoskeletal pain   7. Long term current use of opiate analgesic   8. Chronic pain syndrome     Problems updated and reviewed during this visit: No problems updated. Plan of Care  Pharmacotherapy (Medications Ordered): Meds ordered this encounter  Medications  . meloxicam (MOBIC) 15 MG tablet    Sig: Take 1 tablet (15 mg total) by mouth daily.    Dispense:  180 tablet    Refill:  0     Do not add this medication to the electronic "Automatic Refill" notification system. Patient may have prescription filled one day early if pharmacy is closed on scheduled refill date.    Order Specific Question:   Supervising Provider    Answer:   Milinda Pointer [phone]  . cyclobenzaprine (FLEXERIL) 10 MG tablet    Sig: Take 1 tablet (10 mg total) by mouth 3 (three) times daily as needed for muscle spasms.    Dispense:  540 tablet    Refill:  0    Do  not place medication on "Automatic Refill". Fill one day early if pharmacy is closed on scheduled refill date.    Order Specific Question:   Supervising Provider    Answer:   Milinda Pointer [phone]  . traMADol (ULTRAM) 50 MG tablet    Sig: Take 1 tablet (50 mg total) by mouth every 6 (six) hours as needed.    Dispense:  120 tablet    Refill:  5    Do not place medication on "Automatic Refill". Fill one day early if pharmacy is closed on scheduled refill date. Do not refill any sooner than every 30 days.    Order Specific Question:   Supervising Provider    Answer:   Milinda Pointer [phone]  . diclofenac sodium (VOLTAREN) 1 % GEL    Sig: Apply 4 g topically 4 (four) times daily.    Dispense:  1 Tube    Refill:  5    Do not add this medication to the electronic "Automatic Refill" notification system. Patient may have prescription filled one day early if pharmacy is closed on scheduled refill date.    Order Specific Question:   Supervising Provider    Answer:   Milinda Pointer [[id number]]  This SmartLink is deprecated. Use AVSMEDLIST instead to display the medication list for a patient. Medications administered today: Trisha C. Sheppard Coil. had no medications administered during this visit. Lab-work, procedure(s), and/or referral(s): Orders Placed This Encounter  Procedures  . INTERCOSTAL NERVE BLOCK  . ToxASSURE Select 13 (MW), Urine   Imaging and/or referral(s): None  Interventional therapies: Planned, scheduled, and/or  pending:   Diagnostic Left T11 & T12 intercostalnerve block   Considering:  Diagnostic Left T11 & T12 intercostalnerve block Possible left T11 and T12 intercostal RFA Diagnostic bilateral lumbar facet block + bilateral S-I Block#2 Possible bilateral lumbar facet block + bilateral S-I RFA Diagnostic right-sided lumbar epidural steroid injection  Diagnostic bilateral cervical facetblock  Diagnostic right-sided cervical epiduralsteroid injection  Diagnostic bilateral intra-articular shoulderjoint injections  Diagnostic bilateral suprascapular nerveblocks    Palliative PRN treatment(s):  Palliative bilateral lumbar facet block#2+ bilateral S-Iblock#2.      Provider-requested follow-up: Return in about 6 months (around 01/27/2018) for MedMgmt with Me Dionisio David), , in addition, w/ Dr. Dossie Arbour.  Future Appointments  Date Time Provider [place]  08/12/2017  1:45 PM Milinda Pointer, MD [facility] None  01/25/2018  1:30 PM Vevelyn Francois, NP [facility] None   Primary Care Physician: Marygrace Drought, MD Location: [facility] Note by: Vevelyn Francois NP Date: 07/30/2017; Time: 2:39 PM  Pain Score Disclaimer: We use the NRS-11 scale. This is a self-reported, subjective measurement of pain severity with only modest accuracy. It is used primarily to identify changes within a particular patient. It must be understood that outpatient pain scales are significantly less accurate that those used for research, where they can be applied under ideal controlled circumstances with minimal exposure to variables. In reality, the score is likely to be a combination of pain intensity and pain affect, where pain affect describes the degree of emotional arousal or changes in action readiness caused by the sensory experience of pain. Factors such as social and work situation, setting, emotional state, anxiety levels, expectation, and prior pain experience may  influence pain perception and show large inter-individual differences that may also be affected by time variables.  Patient instructions provided during this appointment: Patient Instructions  You were given a script for Tramadol today with 5 refills.  You have been instructed to get UDS.  You have scripts to be picked up at the pharmacy today.  ____________________________________________________________________________________________  Medication Rules  Applies to: All patients receiving prescriptions (written or electronic).  Pharmacy of record: Pharmacy where electronic prescriptions will be sent. If written prescriptions are taken to a different pharmacy, please inform the nursing staff. The pharmacy listed in the electronic medical record should be the one where you would like electronic prescriptions to be sent.  Prescription refills: Only during scheduled appointments. Applies to both, written and electronic prescriptions.  NOTE: The following applies primarily to controlled substances (Opioid* Pain Medications).   Patient's responsibilities: 1. Pain Pills: Bring all pain pills to every appointment (except for procedure appointments). 2. Pill Bottles: Bring pills in original pharmacy bottle. Always bring newest bottle. Bring bottle, even if empty. 3. Medication refills: You are responsible for knowing and keeping track of what medications you need refilled. The day before your appointment, write a list of all prescriptions that need to be refilled. Bring that list to your appointment and give it to the admitting nurse. Prescriptions will be written only during appointments. If you forget a medication, it will not be "Called in", "Faxed", or "electronically sent". You will need to get another appointment to get these prescribed. 4. Prescription Accuracy: You are responsible for carefully inspecting your prescriptions before leaving our office. Have the discharge nurse carefully go over  each prescription with you, before taking them home. Make sure that your name is accurately spelled, that your address is correct. Check the name and dose of your medication to make sure it is accurate. Check the number of pills, and the written instructions to make sure they are clear and accurate. Make sure that you are given enough medication to last until your next medication refill appointment. 5. Taking Medication: Take medication as prescribed. Never take more pills than instructed. Never take medication more frequently than prescribed. Taking less pills or less frequently is permitted and encouraged, when it comes to controlled substances (written prescriptions).  6. Inform other Doctors: Always inform, all of your healthcare providers, of all the medications you take. 7. Pain Medication from other Providers: You are not allowed to accept any additional pain medication from any other Doctor or Healthcare provider. There are two exceptions to this rule. (see below) In the event that you require additional pain medication, you are responsible for notifying us, as stated below. 8. Medication Agreement: You are responsible for carefully reading and following our Medication Agreement. This must be signed before receiving any prescriptions from our practice. Safely store a copy of your signed Agreement. Violations to the Agreement will result in no further prescriptions. (Additional copies of our Medication Agreement are available upon request.) 9. Laws, Rules, & Regulations: All patients are expected to follow all Federal and [employer], [employer], Rules, [employer]. Ignorance of the Laws does not constitute a valid excuse. The use of any illegal substances is prohibited. 10. Adopted CDC guidelines & recommendations: Target dosing levels will be at or below 60 MME/day. Use of benzodiazepines** is not recommended.  Exceptions: There are only two exceptions to the rule of not receiving pain medications  from other Healthcare Providers. 1. Exception #1 (Emergencies): In the event of an emergency (i.e.: accident requiring emergency care), you are allowed to receive additional pain medication. However, you are responsible for: As soon as you are able, call our office [phone], at any time of the day or night, and leave  a message stating your name, the date and nature of the emergency, and the name and dose of the medication prescribed. In the event that your call is answered by a member of our staff, make sure to document and save the date, time, and the name of the person that took your information.  2. Exception #2 (Planned Surgery): In the event that you are scheduled by another doctor or dentist to have any type of surgery or procedure, you are allowed (for a period no longer than 30 days), to receive additional pain medication, for the acute post-op pain. However, in this case, you are responsible for picking up a copy of our "Post-op Pain Management for Surgeons" handout, and giving it to your surgeon or dentist. This document is available at our office, and does not require an appointment to obtain it. Simply go to our office during business hours (Monday-Thursday from 8:00 AM to 4:00 PM) (Friday 8:00 AM to 12:00 Noon) or if you have a scheduled appointment with [place], prior to your surgery, and ask for it by name. In addition, you will need to provide [place] with your name, name of your surgeon, type of surgery, and date of procedure or surgery.  *Opioid medications include: morphine, codeine, oxycodone, oxymorphone, hydrocodone, hydromorphone, meperidine, tramadol, tapentadol, buprenorphine, fentanyl, methadone. **Benzodiazepine medications include: diazepam (Valium), alprazolam (Xanax), clonazepam (Klonopine), lorazepam (Ativan), clorazepate (Tranxene), chlordiazepoxide (Librium), estazolam (Prosom), oxazepam (Serax), temazepam (Restoril), triazolam  (Halcion)  ____________________________________________________________________________________________

## 2017-08-04 LAB — TOXASSURE SELECT 13 (MW), URINE

## 2017-08-12 ENCOUNTER — Encounter: Payer: Self-pay | Admitting: Pain Medicine

## 2017-08-12 ENCOUNTER — Ambulatory Visit: Payer: PPO | Attending: Pain Medicine | Admitting: Pain Medicine

## 2017-08-12 ENCOUNTER — Other Ambulatory Visit: Payer: Self-pay

## 2017-08-12 VITALS — BP 118/69 | HR 110 | Temp 98.2°F | Resp 16 | Ht 71.0 in | Wt 212.0 lb

## 2017-08-12 DIAGNOSIS — M4802 Spinal stenosis, cervical region: Secondary | ICD-10-CM | POA: Diagnosis not present

## 2017-08-12 DIAGNOSIS — F419 Anxiety disorder, unspecified: Secondary | ICD-10-CM | POA: Diagnosis not present

## 2017-08-12 DIAGNOSIS — Z87891 Personal history of nicotine dependence: Secondary | ICD-10-CM | POA: Insufficient documentation

## 2017-08-12 DIAGNOSIS — E785 Hyperlipidemia, unspecified: Secondary | ICD-10-CM | POA: Diagnosis not present

## 2017-08-12 DIAGNOSIS — Z8052 Family history of malignant neoplasm of bladder: Secondary | ICD-10-CM | POA: Diagnosis not present

## 2017-08-12 DIAGNOSIS — G548 Other nerve root and plexus disorders: Secondary | ICD-10-CM

## 2017-08-12 DIAGNOSIS — M47816 Spondylosis without myelopathy or radiculopathy, lumbar region: Secondary | ICD-10-CM | POA: Diagnosis not present

## 2017-08-12 DIAGNOSIS — Z9889 Other specified postprocedural states: Secondary | ICD-10-CM | POA: Diagnosis not present

## 2017-08-12 DIAGNOSIS — Z79899 Other long term (current) drug therapy: Secondary | ICD-10-CM | POA: Insufficient documentation

## 2017-08-12 DIAGNOSIS — F329 Major depressive disorder, single episode, unspecified: Secondary | ICD-10-CM | POA: Insufficient documentation

## 2017-08-12 DIAGNOSIS — G588 Other specified mononeuropathies: Secondary | ICD-10-CM | POA: Diagnosis not present

## 2017-08-12 DIAGNOSIS — J449 Chronic obstructive pulmonary disease, unspecified: Secondary | ICD-10-CM | POA: Insufficient documentation

## 2017-08-12 DIAGNOSIS — G894 Chronic pain syndrome: Secondary | ICD-10-CM | POA: Insufficient documentation

## 2017-08-12 DIAGNOSIS — Z9049 Acquired absence of other specified parts of digestive tract: Secondary | ICD-10-CM | POA: Diagnosis not present

## 2017-08-12 DIAGNOSIS — G8929 Other chronic pain: Secondary | ICD-10-CM | POA: Diagnosis not present

## 2017-08-12 DIAGNOSIS — M545 Low back pain: Secondary | ICD-10-CM | POA: Diagnosis not present

## 2017-08-12 DIAGNOSIS — I10 Essential (primary) hypertension: Secondary | ICD-10-CM | POA: Insufficient documentation

## 2017-08-12 DIAGNOSIS — Z981 Arthrodesis status: Secondary | ICD-10-CM | POA: Diagnosis not present

## 2017-08-12 DIAGNOSIS — G8912 Acute post-thoracotomy pain: Secondary | ICD-10-CM | POA: Diagnosis not present

## 2017-08-12 DIAGNOSIS — M961 Postlaminectomy syndrome, not elsewhere classified: Secondary | ICD-10-CM | POA: Insufficient documentation

## 2017-08-12 NOTE — Patient Instructions (Addendum)
____________________________________________________________________________________________  Preparing for Procedure with Sedation Instructions: . Oral Intake: Do not eat or drink anything for at least 8 hours prior to your procedure. . Transportation: Public transportation is not allowed. Bring an adult driver. The driver must be physically present in our waiting room before any procedure can be started. Marland Kitchen Physical Assistance: Bring an adult physically capable of assisting you, in the event you need help. This adult should keep you company at home for at least 6 hours after the procedure. . Blood Pressure Medicine: Take your blood pressure medicine with a sip of water the morning of the procedure. . Blood thinners:  . Diabetics on insulin: Notify the staff so that you can be scheduled 1st case in the morning. If your diabetes requires high dose insulin, take only  of your normal insulin dose the morning of the procedure and notify the staff that you have done so. . Preventing infections: Shower with an antibacterial soap the morning of your procedure. . Build-up your immune system: Take 1000 mg of Vitamin C with every meal (3 times a day) the day prior to your procedure. Marland Kitchen Antibiotics: Inform the staff if you have a condition or reason that requires you to take antibiotics before dental procedures. . Pregnancy: If you are pregnant, call and cancel the procedure. . Sickness: If you have a cold, fever, or any active infections, call and cancel the procedure. . Arrival: You must be in the facility at least 30 minutes prior to your scheduled procedure. . Children: Do not bring children with you. . Dress appropriately: Bring dark clothing that you would not mind if they get stained. . Valuables: Do not bring any jewelry or valuables. Procedure appointments are reserved for interventional treatments only. Marland Kitchen No Prescription Refills. . No medication changes will be discussed during procedure  appointments. . No disability issues will be discussed. ____________________________________________________________________________________________   GENERAL RISKS AND COMPLICATIONS  What are the risk, side effects and possible complications? Generally speaking, most procedures are safe.  However, with any procedure there are risks, side effects, and the possibility of complications.  The risks and complications are dependent upon the sites that are lesioned, or the type of nerve block to be performed.  The closer the procedure is to the spine, the more serious the risks are.  Great care is taken when placing the radio frequency needles, block needles or lesioning probes, but sometimes complications can occur. 1. Infection: Any time there is an injection through the skin, there is a risk of infection.  This is why sterile conditions are used for these blocks.  There are four possible types of infection. 1. Localized skin infection. 2. Central Nervous System Infection-This can be in the form of Meningitis, which can be deadly. 3. Epidural Infections-This can be in the form of an epidural abscess, which can cause pressure inside of the spine, causing compression of the spinal cord with subsequent paralysis. This would require an emergency surgery to decompress, and there are no guarantees that the patient would recover from the paralysis. 4. Discitis-This is an infection of the intervertebral discs.  It occurs in about 1% of discography procedures.  It is difficult to treat and it may lead to surgery.        2. Pain: the needles have to go through skin and soft tissues, will cause soreness.       3. Damage to internal structures:  The nerves to be lesioned may be near blood vessels or  other nerves which can be potentially damaged.       4. Bleeding: Bleeding is more common if the patient is taking blood thinners such as  aspirin, Coumadin, Ticiid, Plavix, etc., or if he/she have some genetic  predisposition  such as hemophilia. Bleeding into the spinal canal can cause compression of the spinal  cord with subsequent paralysis.  This would require an emergency surgery to  decompress and there are no guarantees that the patient would recover from the  paralysis.       5. Pneumothorax:  Puncturing of a lung is a possibility, every time a needle is introduced in  the area of the chest or upper back.  Pneumothorax refers to free air around the  collapsed lung(s), inside of the thoracic cavity (chest cavity).  Another two possible  complications related to a similar event would include: Hemothorax and Chylothorax.   These are variations of the Pneumothorax, where instead of air around the collapsed  lung(s), you may have blood or chyle, respectively.       6. Spinal headaches: They may occur with any procedures in the area of the spine.       7. Persistent CSF (Cerebro-Spinal Fluid) leakage: This is a rare problem, but may occur  with prolonged intrathecal or epidural catheters either due to the formation of a fistulous  track or a dural tear.       8. Nerve damage: By working so close to the spinal cord, there is always a possibility of  nerve damage, which could be as serious as a permanent spinal cord injury with  paralysis.       9. Death:  Although rare, severe deadly allergic reactions known as "Anaphylactic  reaction" can occur to any of the medications used.      10. Worsening of the symptoms:  We can always make thing worse.  What are the chances of something like this happening? Chances of any of this occuring are extremely low.  By statistics, you have more of a chance of getting killed in a motor vehicle accident: while driving to the hospital than any of the above occurring .  Nevertheless, you should be aware that they are possibilities.  In general, it is similar to taking a shower.  Everybody knows that you can slip, hit your head and get killed.  Does that mean that you should not  shower again?  Nevertheless always keep in mind that statistics do not mean anything if you happen to be on the wrong side of them.  Even if a procedure has a 1 (one) in a 1,000,000 (million) chance of going wrong, it you happen to be that one..Also, keep in mind that by statistics, you have more of a chance of having something go wrong when taking medications.  Who should not have this procedure? If you are on a blood thinning medication (e.g. Coumadin, Plavix, see list of "Blood Thinners"), or if you have an active infection going on, you should not have the procedure.  If you are taking any blood thinners, please inform your physician.  How should I prepare for this procedure?  Do not eat or drink anything at least six hours prior to the procedure.  Bring a driver with you .  It cannot be a taxi.  Come accompanied by an adult that can drive you back, and that is strong enough to help you if your legs get weak or numb from the local anesthetic.  Take  all of your medicines the morning of the procedure with just enough water to swallow them.  If you have diabetes, make sure that you are scheduled to have your procedure done first thing in the morning, whenever possible.  If you have diabetes, take only half of your insulin dose and notify our nurse that you have done so as soon as you arrive at the clinic.  If you are diabetic, but only take blood sugar pills (oral hypoglycemic), then do not take them on the morning of your procedure.  You may take them after you have had the procedure.  Do not take aspirin or any aspirin-containing medications, at least eleven (11) days prior to the procedure.  They may prolong bleeding.  Wear loose fitting clothing that may be easy to take off and that you would not mind if it got stained with Betadine or blood.  Do not wear any jewelry or perfume  Remove any nail coloring.  It will interfere with some of our monitoring equipment.  NOTE: Remember that  this is not meant to be interpreted as a complete list of all possible complications.  Unforeseen problems may occur.  BLOOD THINNERS The following drugs contain aspirin or other products, which can cause increased bleeding during surgery and should not be taken for 2 weeks prior to and 1 week after surgery.  If you should need take something for relief of minor pain, you may take acetaminophen which is found in Tylenol,m Datril, Anacin-3 and Panadol. It is not blood thinner. The products listed below are.  Do not take any of the products listed below in addition to any listed on your instruction sheet.  A.P.C or A.P.C with Codeine Codeine Phosphate Capsules #3 Ibuprofen Ridaura  ABC compound Congesprin Imuran rimadil  Advil Cope Indocin Robaxisal  Alka-Seltzer Effervescent Pain Reliever and Antacid Coricidin or Coricidin-D  Indomethacin Rufen  Alka-Seltzer plus Cold Medicine Cosprin Ketoprofen S-A-C Tablets  Anacin Analgesic Tablets or Capsules Coumadin Korlgesic Salflex  Anacin Extra Strength Analgesic tablets or capsules CP-2 Tablets Lanoril Salicylate  Anaprox Cuprimine Capsules Levenox Salocol  Anexsia-D Dalteparin Magan Salsalate  Anodynos Darvon compound Magnesium Salicylate Sine-off  Ansaid Dasin Capsules Magsal Sodium Salicylate  Anturane Depen Capsules Marnal Soma  APF Arthritis pain formula Dewitt's Pills Measurin Stanback  Argesic Dia-Gesic Meclofenamic Sulfinpyrazone  Arthritis Bayer Timed Release Aspirin Diclofenac Meclomen Sulindac  Arthritis pain formula Anacin Dicumarol Medipren Supac  Analgesic (Safety coated) Arthralgen Diffunasal Mefanamic Suprofen  Arthritis Strength Bufferin Dihydrocodeine Mepro Compound Suprol  Arthropan liquid Dopirydamole Methcarbomol with Aspirin Synalgos  ASA tablets/Enseals Disalcid Micrainin Tagament  Ascriptin Doan's Midol Talwin  Ascriptin A/D Dolene Mobidin Tanderil  Ascriptin Extra Strength Dolobid Moblgesic Ticlid  Ascriptin with Codeine  Doloprin or Doloprin with Codeine Momentum Tolectin  Asperbuf Duoprin Mono-gesic Trendar  Aspergum Duradyne Motrin or Motrin IB Triminicin  Aspirin plain, buffered or enteric coated Durasal Myochrisine Trigesic  Aspirin Suppositories Easprin Nalfon Trillsate  Aspirin with Codeine Ecotrin Regular or Extra Strength Naprosyn Uracel  Atromid-S Efficin Naproxen Ursinus  Auranofin Capsules Elmiron Neocylate Vanquish  Axotal Emagrin Norgesic Verin  Azathioprine Empirin or Empirin with Codeine Normiflo Vitamin E  Azolid Emprazil Nuprin Voltaren  Bayer Aspirin plain, buffered or children's or timed BC Tablets or powders Encaprin Orgaran Warfarin Sodium  Buff-a-Comp Enoxaparin Orudis Zorpin  Buff-a-Comp with Codeine Equegesic Os-Cal-Gesic   Buffaprin Excedrin plain, buffered or Extra Strength Oxalid   Bufferin Arthritis Strength Feldene Oxphenbutazone   Bufferin plain or Extra Strength Feldene Capsules  Oxycodone with Aspirin   Bufferin with Codeine Fenoprofen Fenoprofen Pabalate or Pabalate-SF   Buffets II Flogesic Panagesic   Buffinol plain or Extra Strength Florinal or Florinal with Codeine Panwarfarin   Buf-Tabs Flurbiprofen Penicillamine   Butalbital Compound Four-way cold tablets Penicillin   Butazolidin Fragmin Pepto-Bismol   Carbenicillin Geminisyn Percodan   Carna Arthritis Reliever Geopen Persantine   Carprofen Gold's salt Persistin   Chloramphenicol Goody's Phenylbutazone   Chloromycetin Haltrain Piroxlcam   Clmetidine heparin Plaquenil   Cllnoril Hyco-pap Ponstel   Clofibrate Hydroxy chloroquine Propoxyphen         Before stopping any of these medications, be sure to consult the physician who ordered them.  Some, such as Coumadin (Warfarin) are ordered to prevent or treat serious conditions such as "deep thrombosis", "pumonary embolisms", and other heart problems.  The amount of time that you may need off of the medication may also vary with the medication and the reason for which  you were taking it.  If you are taking any of these medications, please make sure you notify your pain physician before you undergo any procedures.

## 2017-08-12 NOTE — Progress Notes (Signed)
Patient's Name: Stanley Rios.  MRN: [id number]  Referring Provider: Marygrace Drought, MD  DOB: March 22, 1956  PCP: Marygrace Drought, MD  DOS: 08/12/2017  Note by: Gaspar Cola, MD  Service setting: Ambulatory outpatient  Specialty: Interventional Pain Management  Location: [facility] (AMB) Pain Management Facility    Patient type: Established   Primary Reason(s) for Visit: Encounter for post-procedure evaluation of chronic illness with mild to moderate exacerbation CC: Back Pain (mid)  HPI  Stanley Rios is a 62 y.o. year old, male patient, who comes today for a post-procedure evaluation. He has Opiate withdrawal (HCC); COPD (chronic obstructive pulmonary disease) ([place]); HTN (hypertension); Depression; Anxiety; Neutrophilic leukocytosis; DDD (degenerative disc disease), lumbar; Lumbar facet syndrome (Bilateral) (R>L); Sacroiliac joint pain (Bilateral) (R>L); DDD (degenerative disc disease), cervical; Cervical facet syndrome; Pressure injury of skin; Chronic airway obstruction ([place]); Chronic pain syndrome; Depressive disorder; Dry eye syndrome; Esophageal reflux; Hypertension; Insomnia; Irritable bowel syndrome (IBS); Chronic low back pain (Primary Source of Pain) (Bilateral) (R>L); Posterior subcapsular cataract, bilateral; Cervical post-laminectomy syndrome; Pulmonary emphysema ([place]); Seborrheic keratoses; Spinal stenosis in cervical region; Testosterone deficiency; Tobacco use disorder; Umbilical hernia; Vitamin D deficiency; Vitreomacular adhesion of right eye; Long term current use of opiate analgesic; Opiate use; Long term prescription opiate use; Chronic hip pain; Chronic shoulder pain (Tertiary source of pain) (Bilateral) (R>L); Lower extremity weakness; Chronic neck pain (Secondary source of pain) (Bilateral) (R>L); Chronic lower extremity pain (Bilateral) (R>L); B12 deficiency; Osteoarthritis; Neurogenic pain; Musculoskeletal pain; Diaphragmatic hernia; Chronic hypoxemic respiratory failure ([place]);  Chronic narcotic use; Intercostal neuropathic pain (Left); Post-thoracotomy pain syndrome (Left); Intercostal neuralgia (Left); Claudication ([place]); Chronic chest wall pain (Left); Nodule of right lung; Fatigue; Localized edema; and Former tobacco use on their problem list. His primarily concern today is the Back Pain (mid)  Pain Assessment: Location: Mid, Left Back Radiating: denies Onset: More than a month ago Duration: Chronic pain Quality: Discomfort, Nagging, Tiring Severity: 3 /10 (self-reported pain score)  Note: Reported level is compatible with observation.                         When using our objective Pain Scale, levels between 6 and 10/10 are said to belong in an emergency room, as it progressively worsens from a 6/10, described as severely limiting, requiring emergency care not usually available at an outpatient pain management facility. At a 6/10 level, communication becomes difficult and requires great effort. Assistance to reach the emergency department may be required. Facial flushing and profuse sweating along with potentially dangerous increases in heart rate and blood pressure will be evident. Effect on ADL: cant walk, can't prolong lung therapy, vacuum,bending, twisting Timing: Intermittent Modifying factors: meds, rest  Stanley Rios comes in today for post-procedure evaluation after the treatment done on 07/27/2017.  The patient returns after his lumbar facet radiofrequency.  He is doing great with regards to this, unfortunately, some of his intercostal neuralgia is returning.  Because of this, we will schedule him to come back for a palliative intercostal nerve block.  The last time he had some of these done was around April and it did provide him with good relief until recently.  Further details on both, my assessment(s), as well as the proposed treatment plan, please see below.  Post-Procedure Assessment  07/27/2017 Procedure: Therapeutic left lumbar facet radiofrequency  ablation under fluoroscopic guidance and IV sedation Pre-procedure pain score:  4/10 Post-procedure pain score: 0/10 (100% relief) Influential Factors: BMI: 29.57 kg/m Intra-procedural  challenges: None observed.         Assessment challenges: None detected.              Reported side-effects: None.        Post-procedural adverse reactions or complications: None reported         Sedation: Please see nurses note. When no sedatives are used, the analgesic levels obtained are directly associated to the effectiveness of the local anesthetics. However, when sedation is provided, the level of analgesia obtained during the initial 1 hour following the intervention, is believed to be the result of a combination of factors. These factors may include, but are not limited to: 1. The effectiveness of the local anesthetics used. 2. The effects of the analgesic(s) and/or anxiolytic(s) used. 3. The degree of discomfort experienced by the patient at the time of the procedure. 4. The patients ability and reliability in recalling and recording the events. 5. The presence and influence of possible secondary gains and/or psychosocial factors. Reported result: Relief experienced during the 1st hour after the procedure: 100 % (Ultra-Short Term Relief)            Interpretative annotation: Clinically appropriate result. Analgesia during this period is likely to be Local Anesthetic and/or IV Sedative (Analgesic/Anxiolytic) related.          Effects of local anesthetic: The analgesic effects attained during this period are directly associated to the localized infiltration of local anesthetics and therefore cary significant diagnostic value as to the etiological location, or anatomical origin, of the pain. Expected duration of relief is directly dependent on the pharmacodynamics of the local anesthetic used. Long-acting (4-6 hours) anesthetics used.  Reported result: Relief during the next 4 to 6 hour after the procedure:  100 % (Short-Term Relief)            Interpretative annotation: Clinically appropriate result. Analgesia during this period is likely to be Local Anesthetic-related.          Long-term benefit: Defined as the period of time past the expected duration of local anesthetics (1 hour for short-acting and 4-6 hours for long-acting). With the possible exception of prolonged sympathetic blockade from the local anesthetics, benefits during this period are typically attributed to, or associated with, other factors such as analgesic sensory neuropraxia, antiinflammatory effects, or beneficial biochemical changes provided by agents other than the local anesthetics.  Reported result: Extended relief following procedure: 75 % (Long-Term Relief)            Interpretative annotation: Clinically appropriate result. Good relief. No permanent benefit expected.    Benefit could signal adequate RF ablation.  Current benefits: Defined as reported results that persistent at this point in time.   Analgesia: 75 % Mr. Petion reports improvement of axial symptoms. Function: Mr. Jasperson reports improvement in function ROM: Mr. Petrosian reports improvement in ROM Interpretative annotation: Ongoing benefit. Therapeutic benefit observed. Effective therapeutic approach. Benefit could signal adequate RF ablation.  Interpretation: Results would suggest a successful palliative intervention.                  Plan:  Set up procedure as a PRN palliative treatment option for this patient.        Laboratory Chemistry  Inflammation Markers (CRP: Acute Phase) (ESR: Chronic Phase) Lab Results  Component Value Date   CRP <0.8 08/07/2016   ESRSEDRATE 24 (H) 08/07/2016   LATICACIDVEN 0.9 04/06/2016  Rheumatology Markers No results found for: Elayne Guerin, [facility]              Renal Function Markers Lab Results  Component Value Date   BUN 12 08/07/2016   CREATININE 0.90 08/07/2016    GFRAA >60 08/07/2016   GFRNONAA >60 08/07/2016                 Hepatic Function Markers Lab Results  Component Value Date   AST 20 08/07/2016   ALT 16 (L) 08/07/2016   ALBUMIN 3.6 08/07/2016   ALKPHOS 80 08/07/2016   LIPASE 16 01/16/2016                 Electrolytes Lab Results  Component Value Date   NA 142 08/07/2016   K 3.6 08/07/2016   CL 108 08/07/2016   CALCIUM 8.9 08/07/2016   MG 1.7 08/07/2016   PHOS 3.0 04/06/2016                 Neuropathy Markers Lab Results  Component Value Date   VITAMINB12 176 (L) 08/07/2016   HIV NON REACTIVE 04/10/2016                 Bone Pathology Markers Lab Results  Component Value Date   25OHVITD1 27 (L) 08/07/2016   25OHVITD2 20 08/07/2016   25OHVITD3 7.0 08/07/2016                 Coagulation Parameters Lab Results  Component Value Date   INR 0.83 04/11/2016   LABPROT 11.4 04/11/2016   PLT 318 04/18/2016                 Cardiovascular Markers Lab Results  Component Value Date   TROPONINI <0.03 04/07/2016   HGB 12.5 (L) 04/18/2016   HCT 36.8 (L) 04/18/2016                 CA Markers No results found for: CEA, CA125, LABCA2               Note: Lab results reviewed.  Recent Diagnostic Imaging Results  DG C-Arm 1-60 Min-No Report Fluoroscopy was utilized by the requesting physician.  No radiographic  interpretation.   Complexity Note: Imaging results reviewed. Results shared with Mr. Goldfarb, using Layman's terms.                         Meds   Current Outpatient Medications:  .  acetaminophen (TYLENOL) 500 MG tablet, Take 1,000 mg by mouth., Disp: , Rfl:  .  albuterol (PROVENTIL HFA;VENTOLIN HFA) 108 (90 Base) MCG/ACT inhaler, Inhale 2 puffs into the lungs every 4 (four) hours as needed for wheezing or shortness of breath. Reported on 10/26/2015, Disp: , Rfl:  .  buPROPion (WELLBUTRIN) 75 MG tablet, Take 75 mg by mouth 2 (two) times daily. Reported on 10/26/2015, Disp: , Rfl:  .  Cholecalciferol (VITAMIN D3)  2000 units capsule, TK 1 C PO D, Disp: , Rfl: 0 .  clonazePAM (KLONOPIN) 0.5 MG tablet, Take 0.5 mg by mouth., Disp: , Rfl:  .  cyclobenzaprine (FLEXERIL) 10 MG tablet, Take 1 tablet (10 mg total) by mouth 3 (three) times daily as needed for muscle spasms., Disp: 540 tablet, Rfl: 0 .  diclofenac sodium (VOLTAREN) 1 % GEL, Apply 4 g topically 4 (four) times daily., Disp: 1 Tube, Rfl: 5 .  diltiazem (CARDIZEM SR) 90 MG 12 hr capsule, TAKE ONE CAPSULE  BY MOUTH TWICE DAILY, Disp: , Rfl:  .  DULoxetine (CYMBALTA) 60 MG capsule, Take 60 mg by mouth daily. Reported on 10/26/2015, Disp: , Rfl:  .  finasteride (PROSCAR) 5 MG tablet, Take 5 mg by mouth daily., Disp: , Rfl:  .  fluticasone (FLONASE) 50 MCG/ACT nasal spray, SHAKE LIQUID AND USE 2 SPRAYS IN EACH NOSTRIL DAILY, Disp: , Rfl:  .  Fluticasone-Salmeterol (ADVAIR DISKUS) 250-50 MCG/DOSE AEPB, Inhale into the lungs., Disp: , Rfl:  .  furosemide (LASIX) 20 MG tablet, Take one or two daily as directed., Disp: , Rfl:  .  gabapentin (NEURONTIN) 800 MG tablet, Take by mouth., Disp: , Rfl:  .  ibuprofen (ADVIL,MOTRIN) 200 MG tablet, Take 400 mg by mouth., Disp: , Rfl:  .  ipratropium (ATROVENT HFA) 17 MCG/ACT inhaler, Inhale 2 puffs into the lungs every 6 (six) hours., Disp: , Rfl:  .  ipratropium-albuterol (DUONEB) 0.5-2.5 (3) MG/3ML SOLN, Take 3 mLs by nebulization every 4 (four) hours as needed., Disp: , Rfl:  .  meloxicam (MOBIC) 15 MG tablet, Take 1 tablet (15 mg total) by mouth daily., Disp: 180 tablet, Rfl: 0 .  metoprolol tartrate (LOPRESSOR) 25 MG tablet, Take 1 tablet (25 mg total) by mouth 2 (two) times daily., Disp: 60 tablet, Rfl: 0 .  omeprazole (PRILOSEC) 40 MG capsule, Take 40 mg by mouth 2 (two) times daily., Disp: , Rfl:  .  polyethylene glycol (MIRALAX / GLYCOLAX) packet, Take 17 g by mouth daily., Disp: , Rfl:  .  Potassium Chloride ER 20 MEQ TBCR, Take by mouth., Disp: , Rfl:  .  predniSONE (DELTASONE) 10 MG tablet, Take 1 tablet by  mouth once., Disp: , Rfl:  .  promethazine (PHENERGAN) 25 MG tablet, Take 25 mg by mouth., Disp: , Rfl:  .  tamsulosin (FLOMAX) 0.4 MG CAPS capsule, Take 0.4 mg by mouth daily., Disp: , Rfl:  .  tiotropium (SPIRIVA HANDIHALER) 18 MCG inhalation capsule, Place into inhaler and inhale., Disp: , Rfl:  .  traMADol (ULTRAM) 50 MG tablet, Take 1 tablet (50 mg total) by mouth every 6 (six) hours as needed., Disp: 120 tablet, Rfl: 5 .  traZODone (DESYREL) 50 MG tablet, Take 150 mg by mouth at bedtime., Disp: , Rfl:  .  Vitamin D, Ergocalciferol, (DRISDOL) 50000 units CAPS capsule, Take 1 capsule (50,000 Units total) by mouth 2 (two) times a week x 6 weeks., Disp: 12 capsule, Rfl: 0 .  Cyanocobalamin (VITAMELTS ENERGY VITAMIN B-12) 1500 MCG TBDP, Take 1 tablet by mouth daily., Disp: 180 tablet, Rfl: 0  ROS  Constitutional: Denies any fever or chills Gastrointestinal: No reported hemesis, hematochezia, vomiting, or acute GI distress Musculoskeletal: Denies any acute onset joint swelling, redness, loss of ROM, or weakness Neurological: No reported episodes of acute onset apraxia, aphasia, dysarthria, agnosia, amnesia, paralysis, loss of coordination, or loss of consciousness  Allergies  Mr. Rathgeber has No Known Allergies.  [place]  Drug: Mr. Mangel  reports that he uses drugs. Alcohol:  reports that he does not drink alcohol. Tobacco:  reports that he quit smoking about 14 months ago. His smoking use included cigarettes. He smoked 0.00 packs per day for 40.00 years. He has quit using smokeless tobacco. Medical:  has a past medical history of Anxiety, Chronic back pain, Chronic neck pain, COPD (chronic obstructive pulmonary disease) ([place]), Depression, Hyperlipidemia, Hypertension, Opiate abuse, continuous ([place]), and Pneumonia. Surgical: Mr. Sherwin  has a past surgical history that includes Cholecystectomy; Spinal fusion; Neck surgery (  2002 approx); and Hernia repair. Family: family history includes Bladder  Cancer in his father.  Constitutional Exam  General appearance: Well nourished, well developed, and well hydrated. In no apparent acute distress Vitals:   08/12/17 1339  BP: 118/69  Pulse: (!) 110  Resp: 16  Temp: 98.2 F (36.8 C)  SpO2: 99%  Weight: 212 lb (96.2 kg)  Height: 5' 11"  (1.803 m)   BMI Assessment: Estimated body mass index is 29.57 kg/m as calculated from the following:   Height as of this encounter: 5' 11"  (1.803 m).   Weight as of this encounter: 212 lb (96.2 kg).  BMI interpretation table: BMI level Category Range association with higher incidence of chronic pain  <18 kg/m2 Underweight   18.5-24.9 kg/m2 Ideal body weight   25-29.9 kg/m2 Overweight Increased incidence by 20%  30-34.9 kg/m2 Obese (Class I) Increased incidence by 68%  35-39.9 kg/m2 Severe obesity (Class II) Increased incidence by 136%  >40 kg/m2 Extreme obesity (Class III) Increased incidence by 254%   BMI Readings from Last 4 Encounters:  08/12/17 29.57 kg/m  07/30/17 29.57 kg/m  06/09/17 28.87 kg/m  05/18/17 29.23 kg/m   Wt Readings from Last 4 Encounters:  08/12/17 212 lb (96.2 kg)  07/30/17 212 lb (96.2 kg)  06/09/17 207 lb (93.9 kg)  05/18/17 209 lb (94.8 kg)  Psych/Mental status: Alert, oriented x 3 (person, place, & time)       Eyes: PERLA Respiratory: Oxygen-dependent COPD  Cervical Spine Area Exam  Skin & Axial Inspection: No masses, redness, edema, swelling, or associated skin lesions Alignment: Symmetrical Functional ROM: Unrestricted ROM      Stability: No instability detected Muscle Tone/Strength: Functionally intact. No obvious neuro-muscular anomalies detected. Sensory (Neurological): Unimpaired Palpation: No palpable anomalies              Upper Extremity (UE) Exam    Side: Right upper extremity  Side: Left upper extremity  Skin & Extremity Inspection: Skin color, temperature, and hair growth are WNL. No peripheral edema or cyanosis. No masses, redness, swelling,  asymmetry, or associated skin lesions. No contractures.  Skin & Extremity Inspection: Skin color, temperature, and hair growth are WNL. No peripheral edema or cyanosis. No masses, redness, swelling, asymmetry, or associated skin lesions. No contractures.  Functional ROM: Unrestricted ROM          Functional ROM: Unrestricted ROM          Muscle Tone/Strength: Functionally intact. No obvious neuro-muscular anomalies detected.  Muscle Tone/Strength: Functionally intact. No obvious neuro-muscular anomalies detected.  Sensory (Neurological): Unimpaired          Sensory (Neurological): Unimpaired          Palpation: No palpable anomalies              Palpation: No palpable anomalies              Specialized Test(s): Deferred         Specialized Test(s): Deferred          Thoracic Spine Area Exam  Skin & Axial Inspection: No masses, redness, or swelling Alignment: Symmetrical Functional ROM: Unrestricted ROM Stability: No instability detected Muscle Tone/Strength: Functionally intact. No obvious neuro-muscular anomalies detected. Sensory (Neurological): Unimpaired Muscle strength & Tone: No palpable anomalies  Lumbar Spine Area Exam  Skin & Axial Inspection: No masses, redness, or swelling Alignment: Symmetrical Functional ROM: Unrestricted ROM      Stability: No instability detected Muscle Tone/Strength: Functionally intact. No obvious neuro-muscular anomalies detected.  Sensory (Neurological): Unimpaired Palpation: No palpable anomalies       Provocative Tests: Lumbar Hyperextension and rotation test: evaluation deferred today       Lumbar Lateral bending test: evaluation deferred today       Patrick's Maneuver: evaluation deferred today                    Gait & Posture Assessment  Ambulation: Patient ambulates using a wheel chair Gait: Very limited, using assistive device to ambulate Posture: Antalgic   Lower Extremity Exam    Side: Right lower extremity  Side: Left lower extremity   Skin & Extremity Inspection: Skin color, temperature, and hair growth are WNL. No peripheral edema or cyanosis. No masses, redness, swelling, asymmetry, or associated skin lesions. No contractures.  Skin & Extremity Inspection: Skin color, temperature, and hair growth are WNL. No peripheral edema or cyanosis. No masses, redness, swelling, asymmetry, or associated skin lesions. No contractures.  Functional ROM: Unrestricted ROM          Functional ROM: Unrestricted ROM          Muscle Tone/Strength: Functionally intact. No obvious neuro-muscular anomalies detected.  Muscle Tone/Strength: Functionally intact. No obvious neuro-muscular anomalies detected.  Sensory (Neurological): Unimpaired  Sensory (Neurological): Unimpaired  Palpation: No palpable anomalies  Palpation: No palpable anomalies   Assessment  Primary Diagnosis & Pertinent Problem List: The primary encounter diagnosis was Intercostal neuralgia (Left). Diagnoses of Chronic low back pain (Primary Source of Pain) (Bilateral) (R>L), Lumbar facet syndrome (Bilateral) (R>L), Chronic pain syndrome, Intercostal neuropathic pain (Left), and Post-thoracotomy pain syndrome (Left) were also pertinent to this visit.  Status Diagnosis  Worsened Improved Improved 1. Intercostal neuralgia (Left)   2. Chronic low back pain (Primary Source of Pain) (Bilateral) (R>L)   3. Lumbar facet syndrome (Bilateral) (R>L)   4. Chronic pain syndrome   5. Intercostal neuropathic pain (Left)   6. Post-thoracotomy pain syndrome (Left)     Problems updated and reviewed during this visit: Problem  Chronic neck pain (Secondary source of pain) (Bilateral) (R>L)  Chronic shoulder pain (Tertiary source of pain) (Bilateral) (R>L)  Chronic low back pain (Primary Source of Pain) (Bilateral) (R>L)  Fatigue  Former Tobacco Use  Localized Edema   Plan of Care  Pharmacotherapy (Medications Ordered): No orders of the defined types were placed in this  encounter.  Medications administered today: Malosi C. Sheppard Coil. had no medications administered during this visit.   Procedure Orders     INTERCOSTAL NERVE BLOCK Lab Orders  No laboratory test(s) ordered today   Imaging Orders  No imaging studies ordered today   Referral Orders  No referral(s) requested today    Interventional management options: Planned, scheduled, and/or pending:   Palliative left T12, T11, T10, & T9 intercostal nerve blocks under fluoroscopic guidance and IV sedation    Considering:   Diagnostic Left T11 & T12 intercostalnerve block Possible left T11 and T12 intercostal RFA Diagnostic bilateral lumbar facet block + bilateral S-I Block#2 Possible bilateral lumbar facet block + bilateral S-I RFA Diagnostic right-sided lumbar epidural steroid injection  Diagnostic bilateral cervical facetblock  Diagnostic right-sided cervical epiduralsteroid injection  Diagnostic bilateral intra-articular shoulderjoint injections  Diagnostic bilateral suprascapular nerveblocks    Palliative PRN treatment(s):   None at this time   Provider-requested follow-up: Return for Procedure (w/ sedation): (L) Intercostal NB.  Future Appointments  Date Time Provider [place]  01/25/2018  1:30 PM Vevelyn Francois, NP [facility] None   Primary  Care Physician: Marygrace Drought, MD Location: [facility] Note by: Gaspar Cola, MD Date: 08/12/2017; Time: 2:21 PM

## 2017-08-18 DIAGNOSIS — Z9981 Dependence on supplemental oxygen: Secondary | ICD-10-CM | POA: Diagnosis not present

## 2017-08-18 DIAGNOSIS — K449 Diaphragmatic hernia without obstruction or gangrene: Secondary | ICD-10-CM | POA: Diagnosis not present

## 2017-08-18 DIAGNOSIS — R911 Solitary pulmonary nodule: Secondary | ICD-10-CM | POA: Diagnosis not present

## 2017-08-18 DIAGNOSIS — C3490 Malignant neoplasm of unspecified part of unspecified bronchus or lung: Secondary | ICD-10-CM | POA: Diagnosis not present

## 2017-08-18 DIAGNOSIS — Z09 Encounter for follow-up examination after completed treatment for conditions other than malignant neoplasm: Secondary | ICD-10-CM | POA: Diagnosis not present

## 2017-08-18 DIAGNOSIS — R109 Unspecified abdominal pain: Secondary | ICD-10-CM | POA: Diagnosis not present

## 2017-08-20 ENCOUNTER — Ambulatory Visit: Payer: PPO | Admitting: Pain Medicine

## 2017-09-01 DIAGNOSIS — J432 Centrilobular emphysema: Secondary | ICD-10-CM | POA: Diagnosis not present

## 2017-09-01 DIAGNOSIS — Z01818 Encounter for other preprocedural examination: Secondary | ICD-10-CM | POA: Diagnosis not present

## 2017-09-01 DIAGNOSIS — I10 Essential (primary) hypertension: Secondary | ICD-10-CM | POA: Diagnosis not present

## 2017-09-01 DIAGNOSIS — E663 Overweight: Secondary | ICD-10-CM | POA: Diagnosis not present

## 2017-09-01 DIAGNOSIS — G894 Chronic pain syndrome: Secondary | ICD-10-CM | POA: Diagnosis not present

## 2017-09-01 DIAGNOSIS — K219 Gastro-esophageal reflux disease without esophagitis: Secondary | ICD-10-CM | POA: Diagnosis not present

## 2017-09-01 DIAGNOSIS — F329 Major depressive disorder, single episode, unspecified: Secondary | ICD-10-CM | POA: Diagnosis not present

## 2017-09-02 ENCOUNTER — Encounter: Payer: Self-pay | Admitting: Pain Medicine

## 2017-09-03 NOTE — Telephone Encounter (Signed)
I have sent a staff message on this Thanks

## 2017-09-09 DIAGNOSIS — T17908A Unspecified foreign body in respiratory tract, part unspecified causing other injury, initial encounter: Secondary | ICD-10-CM | POA: Insufficient documentation

## 2017-09-09 DIAGNOSIS — T859XXA Unspecified complication of internal prosthetic device, implant and graft, initial encounter: Secondary | ICD-10-CM | POA: Diagnosis not present

## 2017-09-09 DIAGNOSIS — F329 Major depressive disorder, single episode, unspecified: Secondary | ICD-10-CM | POA: Diagnosis not present

## 2017-09-09 DIAGNOSIS — J939 Pneumothorax, unspecified: Secondary | ICD-10-CM | POA: Diagnosis not present

## 2017-09-09 DIAGNOSIS — Z6829 Body mass index (BMI) 29.0-29.9, adult: Secondary | ICD-10-CM | POA: Diagnosis not present

## 2017-09-09 DIAGNOSIS — T17908D Unspecified foreign body in respiratory tract, part unspecified causing other injury, subsequent encounter: Secondary | ICD-10-CM | POA: Diagnosis not present

## 2017-09-09 DIAGNOSIS — I4510 Unspecified right bundle-branch block: Secondary | ICD-10-CM | POA: Diagnosis not present

## 2017-09-09 DIAGNOSIS — E785 Hyperlipidemia, unspecified: Secondary | ICD-10-CM | POA: Diagnosis not present

## 2017-09-09 DIAGNOSIS — T8149XA Infection following a procedure, other surgical site, initial encounter: Secondary | ICD-10-CM | POA: Diagnosis not present

## 2017-09-09 DIAGNOSIS — I517 Cardiomegaly: Secondary | ICD-10-CM | POA: Diagnosis not present

## 2017-09-09 DIAGNOSIS — F419 Anxiety disorder, unspecified: Secondary | ICD-10-CM | POA: Diagnosis not present

## 2017-09-09 DIAGNOSIS — J90 Pleural effusion, not elsewhere classified: Secondary | ICD-10-CM | POA: Diagnosis not present

## 2017-09-09 DIAGNOSIS — R000 Tachycardia, unspecified: Secondary | ICD-10-CM | POA: Diagnosis not present

## 2017-09-09 DIAGNOSIS — K449 Diaphragmatic hernia without obstruction or gangrene: Secondary | ICD-10-CM | POA: Diagnosis not present

## 2017-09-09 DIAGNOSIS — G894 Chronic pain syndrome: Secondary | ICD-10-CM | POA: Diagnosis not present

## 2017-09-09 DIAGNOSIS — I10 Essential (primary) hypertension: Secondary | ICD-10-CM | POA: Diagnosis not present

## 2017-09-09 DIAGNOSIS — J811 Chronic pulmonary edema: Secondary | ICD-10-CM | POA: Diagnosis not present

## 2017-09-09 DIAGNOSIS — L03311 Cellulitis of abdominal wall: Secondary | ICD-10-CM | POA: Diagnosis not present

## 2017-09-09 DIAGNOSIS — J9811 Atelectasis: Secondary | ICD-10-CM | POA: Diagnosis not present

## 2017-09-09 DIAGNOSIS — Z7951 Long term (current) use of inhaled steroids: Secondary | ICD-10-CM | POA: Diagnosis not present

## 2017-09-09 DIAGNOSIS — Z9889 Other specified postprocedural states: Secondary | ICD-10-CM | POA: Diagnosis not present

## 2017-09-09 DIAGNOSIS — Z9049 Acquired absence of other specified parts of digestive tract: Secondary | ICD-10-CM | POA: Diagnosis not present

## 2017-09-09 DIAGNOSIS — T17998A Other foreign object in respiratory tract, part unspecified causing other injury, initial encounter: Secondary | ICD-10-CM | POA: Diagnosis not present

## 2017-09-09 DIAGNOSIS — T85848A Pain due to other internal prosthetic devices, implants and grafts, initial encounter: Secondary | ICD-10-CM | POA: Diagnosis not present

## 2017-09-09 DIAGNOSIS — E663 Overweight: Secondary | ICD-10-CM | POA: Diagnosis not present

## 2017-09-09 DIAGNOSIS — J432 Centrilobular emphysema: Secondary | ICD-10-CM | POA: Diagnosis not present

## 2017-09-09 DIAGNOSIS — R918 Other nonspecific abnormal finding of lung field: Secondary | ICD-10-CM | POA: Diagnosis not present

## 2017-09-09 DIAGNOSIS — R4182 Altered mental status, unspecified: Secondary | ICD-10-CM | POA: Diagnosis not present

## 2017-09-09 DIAGNOSIS — C3411 Malignant neoplasm of upper lobe, right bronchus or lung: Secondary | ICD-10-CM | POA: Diagnosis not present

## 2017-09-09 DIAGNOSIS — K219 Gastro-esophageal reflux disease without esophagitis: Secondary | ICD-10-CM | POA: Diagnosis not present

## 2017-09-17 ENCOUNTER — Encounter: Payer: Self-pay | Admitting: *Deleted

## 2017-09-17 ENCOUNTER — Other Ambulatory Visit: Payer: Self-pay | Admitting: *Deleted

## 2017-09-17 NOTE — Patient Outreach (Addendum)
[facility]) Care Management  09/17/2017  Stanley Rios. 1956-06-22 [id number]   Transition of care call  Referral received on 09/17/17 Referral source : HTA Referral reason : DC from [facility] on 2/19, S/P surgery.  62 year old male with PMH that includes but limited to  COPD, Chronic pain syndrome, lung lesion , hypertension , anxiety   Successful outreach call to patient , introduced self , person answering phone identified self as wife Christ Fullenwider, HIPAA information verified, explained reason for the call, she exclaimed I am glad to you all  back. Patient previously engaged with [facility] RNCM and LCSW, [place] for services.  Placed patient on the phone explained reason for the call and he is agreeable to services, requesting that we speak with his wife.   Medical conditions  Wife explained patient recent surgery to repair mesh at hernia area in chest, lung area.  She discussed patient is a little weak but able to get up to bathroom , sits in chair in living room a few hours each day,he uses walker with assistance. COPD Wife reports patient wears oxygen at 5 liters, he has Inogen, concentrator, reports oxygen level is in the 92 range when resting concerned when he is more active what level will be.  She reports patient appetite is not good , he eats a little and drinks at least one ensure a day.  Wife discussed patient has completed Pulmonary rehab program and begin working out at the [employer] prior to diagnosis of lung lesion and beginning and completing radiation .   Patient will have home health care services by [facility], anticipate nurse visit on 2/22 and will have PT and bath aide services.   Social  Patient lives at home with his wife, discussed limited family support, son in area has upcoming surgery. They  lives next door to patients' mother sometime relationship is not supportive.  Wife discussed financial concerns, limited transportation due  to car in need of repair does not drive too far from home. Asking about community resources to help with clothing as patient size needs as changed patient has gained weight up to 215 pounds.  Patient discussed previous community resource referral she has received in the past.   Medications Wife reports she fills weekly pill organizer for patient , denies cost concerns related to medication, she reports patient has all medication prescribed. Wife declines reviewing medication at this time.    Patient is appropriate for [facility] care management services for transition of care. Wife has scheduled post hospital visit yet , but she agreed that she would.     Plan Will follow for transition of care , complex care management, home visit scheduled for next week.  Will send CMA patient case status, active message. Will place [facility] social worker consult for transportation concerns, financial concerns and community resources needs request regarding clothing needs. Will send MD barrier note.    THN CM Care Plan Problem One     Most Recent Value  Care Plan Problem One  Risk for hospital admission related recent hospitalization related to Hernia repair   Role Documenting the Problem One  Care Management [place] for Problem One  Active  THN Long Term Goal   Patient will not experiene a hosptial admission in the next 31 days   THN Long Term Goal Start Date  09/17/17  Interventions for Problem One Long Term Goal  Advised regarding continuing to take medication as  prescribed,   THN CM Short Term Goal #1   Patient will attend all medical appointments in the next 18 days  THN CM Short Term Goal #1 Start Date  09/17/17  Interventions for Short Term Goal #1  Advised regarding scheduling post discharge office visit with PCP and attending post surgical visit, Discussed transportaiton concerns, LCSw consult placed.   THN CM Short Term Goal #2   Patient/wife will be able to report sign of infection to  notify MD of in the next 14 days   THN CM Short Term Goal #2 Start Date  09/17/17  Interventions for Short Term Goal #2  Educated on signs of infections, fever, redness at incision, drainage . Advised to continue to use incentive spirometer, at least 4 times a day,with rationale on benefits.discussed action plan of notifying MD .        [name], RN, [place] Management Coordinator  [phone]- Mobile [phone]- Toll Free Main Office

## 2017-09-18 DIAGNOSIS — S51811D Laceration without foreign body of right forearm, subsequent encounter: Secondary | ICD-10-CM | POA: Diagnosis not present

## 2017-09-18 DIAGNOSIS — K219 Gastro-esophageal reflux disease without esophagitis: Secondary | ICD-10-CM | POA: Diagnosis not present

## 2017-09-18 DIAGNOSIS — G894 Chronic pain syndrome: Secondary | ICD-10-CM | POA: Diagnosis not present

## 2017-09-18 DIAGNOSIS — Z7952 Long term (current) use of systemic steroids: Secondary | ICD-10-CM | POA: Diagnosis not present

## 2017-09-18 DIAGNOSIS — L03313 Cellulitis of chest wall: Secondary | ICD-10-CM | POA: Diagnosis not present

## 2017-09-18 DIAGNOSIS — K589 Irritable bowel syndrome without diarrhea: Secondary | ICD-10-CM | POA: Diagnosis not present

## 2017-09-18 DIAGNOSIS — E669 Obesity, unspecified: Secondary | ICD-10-CM | POA: Diagnosis not present

## 2017-09-18 DIAGNOSIS — G4700 Insomnia, unspecified: Secondary | ICD-10-CM | POA: Diagnosis not present

## 2017-09-18 DIAGNOSIS — J439 Emphysema, unspecified: Secondary | ICD-10-CM | POA: Diagnosis not present

## 2017-09-18 DIAGNOSIS — Z9181 History of falling: Secondary | ICD-10-CM | POA: Diagnosis not present

## 2017-09-18 DIAGNOSIS — M961 Postlaminectomy syndrome, not elsewhere classified: Secondary | ICD-10-CM | POA: Diagnosis not present

## 2017-09-18 DIAGNOSIS — Z87891 Personal history of nicotine dependence: Secondary | ICD-10-CM | POA: Diagnosis not present

## 2017-09-18 DIAGNOSIS — F329 Major depressive disorder, single episode, unspecified: Secondary | ICD-10-CM | POA: Diagnosis not present

## 2017-09-18 DIAGNOSIS — M1990 Unspecified osteoarthritis, unspecified site: Secondary | ICD-10-CM | POA: Diagnosis not present

## 2017-09-18 DIAGNOSIS — M4802 Spinal stenosis, cervical region: Secondary | ICD-10-CM | POA: Diagnosis not present

## 2017-09-18 DIAGNOSIS — Z9981 Dependence on supplemental oxygen: Secondary | ICD-10-CM | POA: Diagnosis not present

## 2017-09-18 DIAGNOSIS — T8141XD Infection following a procedure, superficial incisional surgical site, subsequent encounter: Secondary | ICD-10-CM | POA: Diagnosis not present

## 2017-09-18 DIAGNOSIS — Z792 Long term (current) use of antibiotics: Secondary | ICD-10-CM | POA: Diagnosis not present

## 2017-09-18 DIAGNOSIS — I10 Essential (primary) hypertension: Secondary | ICD-10-CM | POA: Diagnosis not present

## 2017-09-18 NOTE — Progress Notes (Signed)
This encounter was created in error - please disregard.

## 2017-09-21 ENCOUNTER — Other Ambulatory Visit: Payer: Self-pay | Admitting: Licensed Clinical Social Worker

## 2017-09-21 NOTE — Patient Outreach (Signed)
[facility]) Care Management  09/21/2017  Khai Arrona. 08/30/1955 [id number]  Assessment-THN CSW [name] is currently covering for [place] while she out of the office. CSW received new referral on 09/18/17 and was unable to follow up with patient before leaving the office and asked for CSW to follow up accordingly. CSW completed outreach attempt today to assist with transportation and clothing needs. CSW unable to reach patient or family successfully. CSW left a HIPPA compliant voice message encouraging patient to return call once available.  Plan-CSW will await return call and will complete second outreach attempt on 09/22/17.  [name], BSW, MSW, [email or web address] Phone: [phone] Fax: [phone]

## 2017-09-22 ENCOUNTER — Other Ambulatory Visit: Payer: Self-pay | Admitting: Licensed Clinical Social Worker

## 2017-09-22 NOTE — Patient Outreach (Signed)
[place] [facility]) Care Management  09/22/2017  Stanley Rios. Jul 26, 1956 [id number]  Assessment- CSW completed second outreach attempt to patient on 09/22/17. Phone call was unable to take place stating "your call cannot be completed as the called party is unavailable." CSW unable to leave a voice message. CSW will mail outreach barrier letter out at this time.  Plan-CSW will send outreach barrier letter and wait 10 business days to complete third outreach on 09/29/17.  Stanley Rios, BSW, MSW, [email or web address] Phone: [phone] Fax: [phone]

## 2017-09-23 ENCOUNTER — Other Ambulatory Visit: Payer: Self-pay | Admitting: *Deleted

## 2017-09-23 ENCOUNTER — Encounter: Payer: Self-pay | Admitting: *Deleted

## 2017-09-23 NOTE — Patient Outreach (Signed)
[facility]) Care Management   09/23/2017  Stanley Rios. 28-Feb-1956 [id number]  Stanley Rios. is an 62 y.o. male   Initial Home visit   62 year old male with recent surgery at [place] left thoracotomy and removal of mesh  Per chart review patient with history of COPD,HTN, GERD  Subjective:  Patient discussed recent hospital admission related to surgery and progress since being home. Patient reports being tired after multiple visit at home this week.  Objective:  BP 110/80 (BP Location: Left Arm, Patient Position: Sitting, Cuff Size: Large)   Pulse 92   Resp 20   Ht 1.803 m (5\' 11" )   Wt 208 lb (94.3 kg)   SpO2 98%   BMI 29.01 kg/m    Review of Systems  HENT: Negative.   Eyes: Negative.   Respiratory: Positive for cough and shortness of breath.   Cardiovascular: Negative.   Gastrointestinal: Negative.   Genitourinary: Negative.   Musculoskeletal: Positive for joint pain.  Skin: Negative.   Neurological: Negative.   Endo/Heme/Allergies: Negative.   Psychiatric/Behavioral: Negative.     Physical Exam  Constitutional: He is oriented to person, place, and time. He appears well-developed and well-nourished.  Cardiovascular: Normal rate, normal heart sounds and intact distal pulses.  Respiratory: Effort normal. He has decreased breath sounds in the right lower field. He has rales in the right lower field and the left lower field.  GI: Soft.  Neurological: He is alert and oriented to person, place, and time.  Skin: Skin is warm and dry.     Psychiatric: He has a normal mood and affect. His behavior is normal. Judgment and thought content normal.    Encounter Medications:   Outpatient Encounter Medications as of 09/23/2017  Medication Sig  . acetaminophen (TYLENOL) 500 MG tablet Take 1,000 mg by mouth.  Marland Kitchen albuterol (PROVENTIL HFA;VENTOLIN HFA) 108 (90 Base) MCG/ACT inhaler Inhale 2 puffs into the lungs every 4 (four) hours as needed for  wheezing or shortness of breath. Reported on 10/26/2015  . buPROPion (WELLBUTRIN) 75 MG tablet Take 75 mg by mouth 2 (two) times daily. Reported on 10/26/2015  . Cholecalciferol (VITAMIN D3) 2000 units capsule TK 1 C PO D  . clonazePAM (KLONOPIN) 0.5 MG tablet Take 0.5 mg by mouth.  . Cyanocobalamin (VITAMELTS ENERGY VITAMIN B-12) 1500 MCG TBDP Take 1 tablet by mouth daily.  . cyclobenzaprine (FLEXERIL) 10 MG tablet Take 1 tablet (10 mg total) by mouth 3 (three) times daily as needed for muscle spasms.  . diclofenac sodium (VOLTAREN) 1 % GEL Apply 4 g topically 4 (four) times daily.  Marland Kitchen diltiazem (CARDIZEM SR) 90 MG 12 hr capsule TAKE ONE CAPSULE BY MOUTH TWICE DAILY  . DULoxetine (CYMBALTA) 60 MG capsule Take 60 mg by mouth daily. Reported on 10/26/2015  . finasteride (PROSCAR) 5 MG tablet Take 5 mg by mouth daily.  . fluticasone (FLONASE) 50 MCG/ACT nasal spray SHAKE LIQUID AND USE 2 SPRAYS IN EACH NOSTRIL DAILY  . Fluticasone-Salmeterol (ADVAIR DISKUS) 250-50 MCG/DOSE AEPB Inhale into the lungs.  . furosemide (LASIX) 20 MG tablet Take one or two daily as directed.  . gabapentin (NEURONTIN) 800 MG tablet Take by mouth.  Marland Kitchen ibuprofen (ADVIL,MOTRIN) 200 MG tablet Take 400 mg by mouth.  Marland Kitchen ipratropium (ATROVENT HFA) 17 MCG/ACT inhaler Inhale 2 puffs into the lungs every 6 (six) hours.  Marland Kitchen ipratropium-albuterol (DUONEB) 0.5-2.5 (3) MG/3ML SOLN Take 3 mLs by nebulization every 4 (four) hours as  needed.  . meloxicam (MOBIC) 15 MG tablet Take 1 tablet (15 mg total) by mouth daily.  . metoprolol tartrate (LOPRESSOR) 25 MG tablet Take 1 tablet (25 mg total) by mouth 2 (two) times daily.  Marland Kitchen omeprazole (PRILOSEC) 40 MG capsule Take 40 mg by mouth 2 (two) times daily.  . polyethylene glycol (MIRALAX / GLYCOLAX) packet Take 17 g by mouth daily.  . Potassium Chloride ER 20 MEQ TBCR Take by mouth.  . predniSONE (DELTASONE) 10 MG tablet Take 1 tablet by mouth once.  . promethazine (PHENERGAN) 25 MG tablet Take 25  mg by mouth.  . tamsulosin (FLOMAX) 0.4 MG CAPS capsule Take 0.4 mg by mouth daily.  Marland Kitchen tiotropium (SPIRIVA HANDIHALER) 18 MCG inhalation capsule Place into inhaler and inhale.  . traMADol (ULTRAM) 50 MG tablet Take 1 tablet (50 mg total) by mouth every 6 (six) hours as needed.  . traZODone (DESYREL) 50 MG tablet Take 150 mg by mouth at bedtime.  . Vitamin D, Ergocalciferol, (DRISDOL) 50000 units CAPS capsule Take 1 capsule (50,000 Units total) by mouth 2 (two) times a week x 6 weeks.   No facility-administered encounter medications on file as of 09/23/2017.     Functional Status:   In your present state of health, do you have any difficulty performing the following activities: 09/17/2017 01/13/2017  Hearing? N N  Vision? N N  Difficulty concentrating or making decisions? Y N  Walking or climbing stairs? Y Y  Comment using walker, forgetful at time  -  Dressing or bathing? Stanley Rios  Comment wife assist  -  Doing errands, shopping? Y Y  Comment wife/familly  assist with transporation  -  Preparing Food and eating ? N Y  Comment wife prepares wife helps  Using the Toilet? N N  In the past six months, have you accidently leaked urine? N N  Do you have problems with loss of bowel control? N N  Managing your Medications? Stanley Rios  Comment wife helps  wife helps   Managing your Finances? Stanley Rios  Comment wife assist  -  Housekeeping or managing your Housekeeping? N Y  Comment wife does house keeping  -  Some recent data might be hidden    Fall/Depression Screening:    Fall Risk  08/12/2017 07/30/2017 06/09/2017  Falls in the past year? No No No  Risk for fall due to : - - -  Risk for fall due to: Comment - - -   PHQ 2/9 Scores 08/12/2017 07/30/2017 06/09/2017 05/13/2017 03/04/2017 01/22/2017 01/13/2017  PHQ - 2 Score 0 0 0 4 3 0 4  PHQ- 9 Score - - - 12 10 - 7    Assessment:  Initial home visit  Wife present. Patient is followed by [facility] home health RN and PT , social worker  had initial evaluation  visits on this week.   Recent thoracic surgery- wound healing, no signs of infection, continues on po clindamycin antibiotic. Post op surgical visit is scheduled.  COPD - continues with oxygen at 3 to 5 liters, tolerating with saturations 95 - 98%, 95% after ambulating in home. Patient using concentrator while at home has inogen for use outside of home. Home health PT to continue to work with patient on breathing technique with inogen .Patient continues to use his incentive spirometry at least 3 times a day.  Ankle swelling Ankle edema, wife reports not much change since discharge, taking lasix daily, wife working on limiting salt in diet. Patient  not consistently weighing daily.  Hypertension- patient/wife not monitoring reading at home, monitor is broken. Patient continues to take medications as prescribed, his uses pill organizer and patient helps with making sure when new refills are needed.  Social Wife is primary caregiver, she discussed previous concerns regarding transportation to office visit, states at this time transportation to upcoming appointment has been arranged with friend/family support.She also previously voiced  Financial concerns requesting husband need regarding clothing voucher as she has received previously,  Wife discussed that  patient does have proper fitting clothing to wear at home and to MD visit. She reports patient need is for supportive shoes to wear.  Wife reports Therapist, occupational is looking into resources for low income housing, and reliable transportation through [place] for wheels car donation program.  Patient has 3 Set designer in her home, that she received through donation that are not going to work for patient and taking up walking space in home, she has placed call to her church to see if they will be able to pick up, patient has tried local resources for pickup also.  Plan:  Will update LCSW regarding visit today and transportation to office visits, wife  has arranged a ride through family/friend. RNCM will send PCP office visit note RNCM will review [facility] resources for blood pressure monitor.  RN will schedule transition of care call in the next week.  THN CM Care Plan Problem One     Most Recent Value  Care Plan Problem One  Risk for hospital admission related recent hospitalization related to Hernia repair   Role Documenting the Problem One  Care Management [place] for Problem One  Active  THN Long Term Goal   Patient will not experiene a hosptial admission in the next 31 days   THN Long Term Goal Start Date  09/17/17  Interventions for Problem One Long Term Goal  Advised regarding continuing to take medication as prescribed,   THN CM Short Term Goal #1   Patient will attend all medical appointments in the next 18 days  THN CM Short Term Goal #1 Start Date  09/17/17  Interventions for Short Term Goal #1  Discussed  upcoming visits to surgeon and PCP, verified transportation to visits   [facility] CM Short Term Goal #2   Patient/wife will be able to report sign of infection to notify MD of in the next 14 days   THN CM Short Term Goal #2 Start Date  09/17/17  Interventions for Short Term Goal #2  Reviewed signs of infection, reviewed clinical state, will check into resources of thermometer , reinforced notifying MD of signs of infection .   THN CM Short Term Goal #3  Patient will begin to weigh daily and keep a record in the next 30 days   THN CM Short Term Goal #3 Start Date  09/23/17  Interventions for Short [place]ern Goal #3  Education on daily weights,best time to weigh,in the morning after urinating , provided [facility] calendar for keeping a record. Discussed notifyig MD of sudden weight gains of 3 pounds in a day and 5 in  a week.       [name], RN, [place] Management Coordinator  [phone]- Mobile [phone]- Toll Free Main Office

## 2017-09-24 ENCOUNTER — Other Ambulatory Visit: Payer: Self-pay | Admitting: Licensed Clinical Social Worker

## 2017-09-24 DIAGNOSIS — T8189XA Other complications of procedures, not elsewhere classified, initial encounter: Secondary | ICD-10-CM | POA: Diagnosis not present

## 2017-09-24 DIAGNOSIS — S51801A Unspecified open wound of right forearm, initial encounter: Secondary | ICD-10-CM | POA: Diagnosis not present

## 2017-09-24 NOTE — Patient Outreach (Signed)
[place] [facility]) Care Management  09/24/2017  Jerin Franzel. 09-Mar-1956 [id number]  Assessment- CSW currently covering for [place]. CSW received update from [place] on 09/24/17. Transportation needs were met successfully. Patient's spouse has arrange transportation with family/friends forupcoming medical appointments on 09/29/17 and 10/08/17. Per [facility] RNCM, spouse mentioned still need clothing resources.Marland Kitchen Spouse was able to recall clothing voucher she received for previous needs through [employer] that [place] helped with. Spouse also states [place] worker is following her and referred her to program called [employer].  Plan-CSW will update THN CSW Chrystal Land upon her return back to the office. She will follow up with clothing needs when third outreach attempt it completed.  [name], BSW, MSW, [email or web address] Phone: [phone] Fax: [phone]

## 2017-09-29 DIAGNOSIS — K219 Gastro-esophageal reflux disease without esophagitis: Secondary | ICD-10-CM | POA: Diagnosis not present

## 2017-09-29 DIAGNOSIS — C3490 Malignant neoplasm of unspecified part of unspecified bronchus or lung: Secondary | ICD-10-CM | POA: Diagnosis not present

## 2017-09-29 DIAGNOSIS — J449 Chronic obstructive pulmonary disease, unspecified: Secondary | ICD-10-CM | POA: Diagnosis not present

## 2017-09-29 DIAGNOSIS — I10 Essential (primary) hypertension: Secondary | ICD-10-CM | POA: Diagnosis not present

## 2017-09-29 DIAGNOSIS — T17908D Unspecified foreign body in respiratory tract, part unspecified causing other injury, subsequent encounter: Secondary | ICD-10-CM | POA: Diagnosis not present

## 2017-09-29 DIAGNOSIS — Z09 Encounter for follow-up examination after completed treatment for conditions other than malignant neoplasm: Secondary | ICD-10-CM | POA: Diagnosis not present

## 2017-10-01 ENCOUNTER — Other Ambulatory Visit: Payer: Self-pay | Admitting: *Deleted

## 2017-10-01 NOTE — Patient Outreach (Signed)
[place] [facility]) Care Management  10/01/2017  Maddex Garlitz. 03-07-1956 [id number]  Transition of care call  Successful outreach call to patient, able to speak with his wife Lavalle Skoda, HIPAA verified.  Wife reports patient is doing pretty good, he is currently visiting with home health nurse.  Wife discussed patient had visit with surgeon on this week and everything was looking good.  Wife discussed patient continues to tolerate participation with home health physical therapy, he is wearing oxygen at 2 to 3 liters with levels staying 96-98%, he continues to need reinforcement with using incentive spirometry.   Wife discussed patient has decreased swelling in his lower legs,she attributes this to patient wearing diabetic support socks,  his weight today is 211 without sudden weight gain noted.  Patient has follow up visit with Dr.Heffington , PCP in the next week, wife reports she has transportation.   Plan Will schedule next call in a week, for transition of care follow up.   [name], RN, [place] Management Coordinator  [phone]- Mobile [phone]- Toll Free Main Office

## 2017-10-05 ENCOUNTER — Ambulatory Visit: Payer: PPO | Admitting: Nurse Practitioner

## 2017-10-07 ENCOUNTER — Other Ambulatory Visit: Payer: Self-pay | Admitting: *Deleted

## 2017-10-07 NOTE — Patient Outreach (Signed)
[place] [facility]) Care Management  10/07/2017  Stanley Rios. 03/01/1956 [id number]   Transition of care call   Successful outreach call to patient ,spoke with his wife [name], HIPAA verified.   She reports patient is doing well , she discussed his recent to surgeon office and that is has recovered well, incision healed.   Wife reports patient continues to work with home health therapy, progressing well but complains of being tired afterward. Patient continues to work on using his incentive spirometry daily, oxygen level remaining 94 % range at rest and wearing oxygen at 2 liters. Wife reports some decrease of swelling of lower legs, no report of sudden increase in weight of 3 pounds in a day or 5 in a week, reminding to notify MD if this occurs.   Patient has PCP appointment on tomorrow.  Wife denies any new concerns   Plan  Will plan follow up transition of care call in the next week, and schedule next home visit ,to provide blood pressure monitor education.    [name], RN, [place] Management Coordinator  [phone]- Mobile [phone]- Toll Free Main Office

## 2017-10-08 ENCOUNTER — Other Ambulatory Visit: Payer: Self-pay | Admitting: *Deleted

## 2017-10-08 ENCOUNTER — Ambulatory Visit: Payer: Self-pay | Admitting: *Deleted

## 2017-10-08 DIAGNOSIS — R609 Edema, unspecified: Secondary | ICD-10-CM | POA: Diagnosis not present

## 2017-10-08 DIAGNOSIS — I10 Essential (primary) hypertension: Secondary | ICD-10-CM | POA: Diagnosis not present

## 2017-10-08 NOTE — Patient Outreach (Signed)
[place] [facility]) Care Management  10/08/2017  Stanley Rios. [birth year] [id number]   Patient referred to this social worker by [facility] to assist with clothing resources. Voicemail message left for a return call.   [name] [place] Care Management [phone]

## 2017-10-12 ENCOUNTER — Other Ambulatory Visit: Payer: Self-pay | Admitting: *Deleted

## 2017-10-12 NOTE — Patient Outreach (Signed)
[place] [facility]) Care Management  10/12/2017  Jourdan Durbin. 1956/06/30 [id number]   Patient referred to this social worker for assistance with a transfer bench Fluid, and a referral to the [employer] for clothes as he has  gained weight and cannot afford new clothes. Patient gave verbal permission to speak to patient's spouse. Per patient's spouse, patient has gained weight and can no longer fit into his clothes. She is requesting a referral for a clothing voucher through the [employer].  Patient's wife also dicussed the recommendation by PT for a transfer bench for the shower to avoid falls.   Plan: This Education officer, museum will provide patient with letter to request a clothing voucher through the [employer]. This Education officer, museum will discuss DME needs with [place], [place] Care Management [phone]

## 2017-10-15 ENCOUNTER — Ambulatory Visit: Payer: Self-pay | Admitting: *Deleted

## 2017-10-15 ENCOUNTER — Other Ambulatory Visit: Payer: Self-pay | Admitting: *Deleted

## 2017-10-15 NOTE — Patient Outreach (Signed)
[place] [facility]) Care Management  10/15/2017  Stanley Rios. Feb 14, 1956 [id number]   Transition of care call  Successful outreach call to patient able to speak with his wife Stanley Rios, HIPAA verified.  Wife discussed recent visit to PCP and medication changes, decreasing diltiazem decreased to 120 mg and continuing lasix twice daily.. Patient has denied dizziness, continued with swelling in lower legs, breathing in usual green zone wearing oxygen as prescribed.   Wife reports assisting patient with daily weights reports recent weight is 214, has not weighed yet today. Patient has scheduled cardiology referral due to concerns of reports decrease in blood pressure and heart rate after exercise. Wife is keeping a record of home health blood pressure readings as HHRN ,PT, OT visit at least 3 days week. Patients' home BP monitor is not working.   Wife reports family will provide transportation to office visit with cardiologist in April.    Plan Will plan home visit in the next to provide blood pressure monitor and education on use.    [name], RN, [place] Management Coordinator  [phone]- Mobile [phone]- Toll Free Main Office

## 2017-10-19 ENCOUNTER — Other Ambulatory Visit: Payer: Self-pay | Admitting: *Deleted

## 2017-10-19 NOTE — Patient Outreach (Signed)
[facility]) Care Management   10/19/2017  Stanley Rios. 07-07-1956 [id number]  Stanley Rios. is an 62 y.o. male  Subjective:  Doing pretty good , so far just getting up, wife discussed patient being dizzy once on yesterday after he first got up.   Objective:  BP 108/78 (BP Location: Left Arm, Patient Position: Sitting, Cuff Size: Large)   Pulse (!) 103   Resp 20   Wt 211 lb (95.7 kg)   SpO2 96%   BMI 29.43 kg/m  Review of Systems  Constitutional: Negative.   HENT: Negative.   Eyes: Negative.   Cardiovascular: Positive for leg swelling.       Ankle edema bilateral   Gastrointestinal: Negative.   Genitourinary: Negative.   Musculoskeletal: Positive for joint pain.  Skin: Negative.   Neurological: Positive for dizziness.  Psychiatric/Behavioral: Negative.     Physical Exam  Constitutional: He is oriented to person, place, and time. He appears well-developed and well-nourished.  Cardiovascular: Normal rate and normal heart sounds.  Respiratory: Effort normal.  GI: Soft.  Neurological: He is alert and oriented to person, place, and time.  Skin: Skin is warm and dry.  Psychiatric: He has a normal mood and affect. His behavior is normal. Judgment and thought content normal.    Encounter Medications:   Outpatient Encounter Medications as of 10/19/2017  Medication Sig Note  . acetaminophen (TYLENOL) 500 MG tablet Take 1,000 mg by mouth.   Marland Kitchen albuterol (PROVENTIL HFA;VENTOLIN HFA) 108 (90 Base) MCG/ACT inhaler Inhale 2 puffs into the lungs every 4 (four) hours as needed for wheezing or shortness of breath. Reported on 10/26/2015   . buPROPion (WELLBUTRIN) 75 MG tablet Take 75 mg by mouth 2 (two) times daily. Reported on 10/26/2015   . Cholecalciferol (VITAMIN D3) 2000 units capsule TK 1 C PO D   . clonazePAM (KLONOPIN) 0.5 MG tablet Take 0.5 mg by mouth 3 (three) times daily as needed.    . Cyanocobalamin (VITAMELTS ENERGY VITAMIN B-12) 1500 MCG TBDP  Take 1 tablet by mouth daily.   . cyclobenzaprine (FLEXERIL) 10 MG tablet Take 1 tablet (10 mg total) by mouth 3 (three) times daily as needed for muscle spasms.   . diclofenac sodium (VOLTAREN) 1 % GEL Apply 4 g topically 4 (four) times daily.   Marland Kitchen diltiazem (CARDIZEM SR) 90 MG 12 hr capsule takes 180 mg daily   . DULoxetine (CYMBALTA) 60 MG capsule Take 60 mg by mouth daily. Reported on 10/26/2015   . finasteride (PROSCAR) 5 MG tablet Take 5 mg by mouth daily.   . fluticasone (FLONASE) 50 MCG/ACT nasal spray SHAKE LIQUID AND USE 2 SPRAYS IN EACH NOSTRIL DAILY   . Fluticasone-Salmeterol (ADVAIR DISKUS) 250-50 MCG/DOSE AEPB Inhale into the lungs.   . furosemide (LASIX) 20 MG tablet Take one or two daily as directed.   . gabapentin (NEURONTIN) 800 MG tablet Take by mouth.   Marland Kitchen ibuprofen (ADVIL,MOTRIN) 200 MG tablet Take 400 mg by mouth.   Marland Kitchen ipratropium (ATROVENT HFA) 17 MCG/ACT inhaler Inhale 2 puffs into the lungs every 6 (six) hours.   Marland Kitchen ipratropium-albuterol (DUONEB) 0.5-2.5 (3) MG/3ML SOLN Take 3 mLs by nebulization every 4 (four) hours as needed.   . meloxicam (MOBIC) 15 MG tablet Take 1 tablet (15 mg total) by mouth daily.   . metoprolol tartrate (LOPRESSOR) 25 MG tablet Take 1 tablet (25 mg total) by mouth 2 (two) times daily.   Marland Kitchen omeprazole (PRILOSEC) 40 MG  capsule Take 40 mg by mouth 2 (two) times daily.   . polyethylene glycol (MIRALAX / GLYCOLAX) packet Take 17 g by mouth daily.   . Potassium Chloride ER 20 MEQ TBCR Take 1 tablet by mouth daily.    . predniSONE (DELTASONE) 10 MG tablet Take 1 tablet by mouth daily.    . promethazine (PHENERGAN) 25 MG tablet Take 25 mg by mouth every 6 (six) hours as needed.  09/23/2017: Has on hand if needed  . tamsulosin (FLOMAX) 0.4 MG CAPS capsule Take 0.4 mg by mouth daily.   Marland Kitchen tiotropium (SPIRIVA HANDIHALER) 18 MCG inhalation capsule Place into inhaler and inhale.   . traMADol (ULTRAM) 50 MG tablet Take 1 tablet (50 mg total) by mouth every 6 (six)  hours as needed.   . traZODone (DESYREL) 50 MG tablet Take 150 mg by mouth at bedtime.   . Vitamin D, Ergocalciferol, (DRISDOL) 50000 units CAPS capsule Take 1 capsule (50,000 Units total) by mouth 2 (two) times a week x 6 weeks. (Patient not taking: Reported on 09/23/2017)    No facility-administered encounter medications on file as of 10/19/2017.     Functional Status:   In your present state of health, do you have any difficulty performing the following activities: 09/17/2017 01/13/2017  Hearing? N N  Vision? N N  Difficulty concentrating or making decisions? Y N  Walking or climbing stairs? Y Y  Comment using walker, forgetful at time  -  Dressing or bathing? [name]  Comment wife assist  -  Doing errands, shopping? Y Y  Comment wife/familly  assist with transporation  -  Preparing Food and eating ? N Y  Comment wife prepares wife helps  Using the Toilet? N N  In the past six months, have you accidently leaked urine? N N  Do you have problems with loss of bowel control? N N  Managing your Medications? [name]  Comment wife helps  wife helps   Managing your Finances? [name]  Comment wife assist  -  Housekeeping or managing your Housekeeping? N Y  Comment wife does house keeping  -  Some recent data might be hidden    Fall/Depression Screening:    Fall Risk  09/23/2017 08/12/2017 07/30/2017  Falls in the past year? No No No  Risk for fall due to : Impaired balance/gait;Impaired mobility - -  Risk for fall due to: Comment - - -   PHQ 2/9 Scores 09/23/2017 08/12/2017 07/30/2017 06/09/2017 05/13/2017 03/04/2017 01/22/2017  PHQ - 2 Score 2 0 0 0 4 3 0  PHQ- 9 Score 4 - - - 12 10 -    Assessment:  Routine home visit     Recent Surgery- Incision healed, progressing with  Mobility continued home health RN, PT services.  Lower edema swelling/ reported decreased swelling, in legs, with ankle edema, weight 211 on today, continuing to taking medications as prescribed  Recent reported low  blood pressure  and heart rate after standing by Home health.  .Need for blood monitor for home monitoring, denies dizziness with position changes during visit.  COPD  Usual breathing green zone,oxygen at 2 liters  , PT working on breathing using her inogen, pulsed oxygen when going out.  Patient has follow up PCP visit on 4/12,  New cardiology referral appointment on 4/17 with Dr. Marylen Ponto   Plan:  Will continue to follow for complex care management , will plan return call in the next 2 weeks.  Provided patient with  blood pressure monitor and will provide education at visit.    [name], RN, [place] Management Coordinator  [phone]- Mobile [phone]- Toll Free Main Office

## 2017-10-19 NOTE — Patient Outreach (Signed)
[place] [facility]) Care Management  10/19/2017  Chon Buhl. 1956-07-20 [id number]   Follow up phone call to patient's spouse to discuss referreral to the [employer] for a clothing voucher as wel as the transfer bench. The [employer] referral letter completed. transfer bench identified, however will have to be ordered in the store and shipped to her home.   Plan: This Education officer, museum will follow up with patient's spouse after the transfer bench has been ordered(within 2 weeks).    [name] [facility] Care Management [phone]

## 2017-10-22 ENCOUNTER — Ambulatory Visit: Payer: Self-pay | Admitting: *Deleted

## 2017-10-23 ENCOUNTER — Other Ambulatory Visit: Payer: Self-pay | Admitting: Pain Medicine

## 2017-10-23 DIAGNOSIS — M792 Neuralgia and neuritis, unspecified: Secondary | ICD-10-CM

## 2017-10-27 ENCOUNTER — Other Ambulatory Visit: Payer: Self-pay | Admitting: *Deleted

## 2017-10-27 NOTE — Patient Outreach (Signed)
[facility]) Care Management  10/27/2017  Jiovany Scheffel. 05-Dec-1955 [id number]   Phone call to patient's spouse to discuss the [facility] referral and DME needs. Referral completed and will be taken to patient's home today along with the transfer bench purchased by [facility] giving committee. Patient's spouse states that an appointment with the intake worker at the [employer] has been scheduled for 10/29/17 at which time she will receive a clothing voucher for patient and herself. Patient's spouse verbalized no additional community resource needs at this time.   Plan: This Education officer, museum will provide patient with the transfer bench along with the [employer] referral today. Patient to be closed to [facility] social work. RNCM to be notified.   [name] [facility] Care Management [phone]

## 2017-10-30 ENCOUNTER — Other Ambulatory Visit: Payer: Self-pay | Admitting: Pain Medicine

## 2017-10-30 DIAGNOSIS — E559 Vitamin D deficiency, unspecified: Secondary | ICD-10-CM

## 2017-11-04 ENCOUNTER — Ambulatory Visit: Payer: Self-pay | Admitting: *Deleted

## 2017-11-06 ENCOUNTER — Other Ambulatory Visit: Payer: Self-pay | Admitting: *Deleted

## 2017-11-06 DIAGNOSIS — J431 Panlobular emphysema: Secondary | ICD-10-CM | POA: Diagnosis not present

## 2017-11-06 DIAGNOSIS — R600 Localized edema: Secondary | ICD-10-CM | POA: Diagnosis not present

## 2017-11-06 DIAGNOSIS — I10 Essential (primary) hypertension: Secondary | ICD-10-CM | POA: Diagnosis not present

## 2017-11-06 NOTE — Patient Outreach (Signed)
[facility]) Care Management  11/06/2017  Stanley Rios. January 01, 1956 [id number]   Telephone follow up   Referral received on 09/17/17 Referral source : HTA Referral reason : DC from [facility] on 2/19, S/P surgery left thoracotomy and removal of mesh  62 year old male with PMH that includes but limited to  COPD, Chronic pain syndrome, lung lesion , hypertension , anxiety   Successful telephone follow up call to patient able to speak with his wife, HIPAA information verified.  Wife discussed visit to PCP office on today, for follow up on hypertension,and concern for weight gain. Wife reports patient weight is up to 218 on home scales, 8 pound weight gain over last 3 weeks,MD reinforced low salt diet and patient continues with Lasix once a day dosing with increased dose.  Wife discussed patient visit with cardiologist on 4/17.  Wife discussed patient visit to Dr. Wynonia Musty pulmonary  on 4/11, and discussion on patient increased used of rescue inhaler, patient has now been started on Trelogy inhaler and Advair and Spiriva stopped.  Per wife report patient continues with home PT and OT and getting some stronger. She has also purchased patient and stationary cycle to use of arms and legs. Wife expressed appreciation of [facility] LCSW Chrystal and assistance with shower bench that is working well for patient .    Plan Will follow up with home visit in the next 2 weeks.  Reinforced education on notifying MD of sudden weight gains of 3 pounds in a day and 5 in a week.       [facility] CM Care Plan Problem One     Most Recent Value  Care Plan Problem One  Risk for hospital admission related recent hospitalization related to Hernia repair   Role Documenting the Problem One  Care Management [place] for Problem One  Active  [facility] Long Term Goal   Patient will not experiene a hosptial admission in the next 60  days  [goal updated for complex care managment ]  [facility]N Long  Term Goal Start Date  09/17/17  Interventions for Problem One Long Term Goal  Reinforced taking medications as prescribed , continuing to weigh daily, and montior blood pressure .   [facility] CM Short Term Goal #1   Patient will attend all medical appointments in the next 18 days  [facility] CM Short Term Goal #1 Start Date  09/17/17  [facility] CM Short Term Goal #1 Met Date  10/15/17  [facility] CM Short Term Goal #2   Patient/wife will be able to report sign of infection to notify MD of in the next 14 days   [facility] CM Short Term Goal #2 Start Date  09/17/17  [facility] CM Short Term Goal #2 Met Date  10/07/17  [facility] CM Short Term Goal #3  Patient will begin to weigh daily and keep a record in the next 30 days   [facility] CM Short Term Goal #3 Start Date  09/23/17  [facility] CM Short Term Goal #3 Met Date  10/19/17  [facility] CM Short Term Goal #4  Over the next 14 days patient/wife will begin to monitor blood pressure daily and keep a record.   [facility] CM Short Term Goal #4 Start Date  10/15/17  Interventions for Short Term Goal #4  Reviewed recent reading and reinforced continued monitoring notifying MD of abnormal , take record new cardiology visit.   [facility] CM Short Term Goal #5   Patient will report increased  knowledge on low sodium diet information in the next 30 days   [facility] CM Short Term Goal #5 Start Date  11/06/17  Interventions for Short Term Goal #5  RN advised patient/wife regarding low sodium diet limit of 2 Grams a day, reviewed foods with high salt limits and lower salt choices, seasoning choices.      [name], RN, [place] Management Coordinator  [phone]- Mobile [phone]- Toll Free Main Office

## 2017-11-11 DIAGNOSIS — J431 Panlobular emphysema: Secondary | ICD-10-CM | POA: Diagnosis not present

## 2017-11-11 DIAGNOSIS — R001 Bradycardia, unspecified: Secondary | ICD-10-CM | POA: Diagnosis not present

## 2017-11-11 DIAGNOSIS — R600 Localized edema: Secondary | ICD-10-CM | POA: Diagnosis not present

## 2017-11-11 DIAGNOSIS — J439 Emphysema, unspecified: Secondary | ICD-10-CM | POA: Diagnosis not present

## 2017-11-11 DIAGNOSIS — Z79899 Other long term (current) drug therapy: Secondary | ICD-10-CM | POA: Diagnosis not present

## 2017-11-11 DIAGNOSIS — Z87891 Personal history of nicotine dependence: Secondary | ICD-10-CM | POA: Diagnosis not present

## 2017-11-11 DIAGNOSIS — Z9981 Dependence on supplemental oxygen: Secondary | ICD-10-CM | POA: Diagnosis not present

## 2017-11-11 DIAGNOSIS — I10 Essential (primary) hypertension: Secondary | ICD-10-CM | POA: Diagnosis not present

## 2017-11-11 DIAGNOSIS — Z8249 Family history of ischemic heart disease and other diseases of the circulatory system: Secondary | ICD-10-CM | POA: Diagnosis not present

## 2017-11-11 DIAGNOSIS — Z7951 Long term (current) use of inhaled steroids: Secondary | ICD-10-CM | POA: Diagnosis not present

## 2017-11-11 DIAGNOSIS — K30 Functional dyspepsia: Secondary | ICD-10-CM | POA: Diagnosis not present

## 2017-11-11 DIAGNOSIS — R609 Edema, unspecified: Secondary | ICD-10-CM | POA: Diagnosis not present

## 2017-11-19 ENCOUNTER — Other Ambulatory Visit: Payer: Self-pay | Admitting: *Deleted

## 2017-11-19 ENCOUNTER — Ambulatory Visit: Payer: Self-pay | Admitting: *Deleted

## 2017-11-19 DIAGNOSIS — I10 Essential (primary) hypertension: Secondary | ICD-10-CM | POA: Diagnosis not present

## 2017-11-19 NOTE — Patient Outreach (Signed)
[facility]) Care Management  11/19/2017  Stanley Rios. 1956/05/27 [id number]  Referral received on 09/17/17 Referral source : HTA Referral reason : DC from [facility] on 2/19, S/P surgery left thoracotomy and removal of mesh  62 year old male with PMHthat includes but limited toCOPD, Chronic pain syndrome, lung lesion , hypertension , anxiety  Placed telephone call to patient prior to arrival of scheduled home visit, patient wife Stanley Rios answers phone HIPAA information verified.  Wife discussed patient recent visit to cardiologist and patient has scheduled Echocardiogram scheduled for this morning.  Wife discussed patient continues to take lasix twice daily and keeping track of weights. Wife discussed watching salt in diet, denies sudden weight gain of 3 pounds in a day or 5 in a week.  Wife reports patient is using albuterol inhaler less since he has started on Trelogy inhaler.   Wife agreeable to rescheduling home visit for the next month.   Plan Plan return call to reschedule home visit.  Will plan follow up home visit in the next month for continued assessment and progression of patient care goals .   Stanley Draft, RN, [place] Management Coordinator  [phone]- Mobile [phone]- Toll Free Main Office

## 2017-11-25 ENCOUNTER — Other Ambulatory Visit: Payer: Self-pay | Admitting: Nurse Practitioner

## 2017-11-25 DIAGNOSIS — M159 Polyosteoarthritis, unspecified: Secondary | ICD-10-CM

## 2017-11-25 DIAGNOSIS — M150 Primary generalized (osteo)arthritis: Principal | ICD-10-CM

## 2017-12-02 ENCOUNTER — Other Ambulatory Visit: Payer: Self-pay | Admitting: *Deleted

## 2017-12-02 NOTE — Patient Outreach (Signed)
[facility]) Care Management   12/02/2017  Stanley Rios. 21-Oct-1955 [id number]  Stanley Rios. is an 62 y.o. male  Subjective:  Patient wife discussed his recent appointment with Dr.Cavender , cardiologist, at [facility] and 2D echo and follow up appointment within this month.  Wife discussed patient has had decreased use of rescue inhaler since starting Trelogy.  Wife discussed patient using inogen portable  oxygen concentrator when away from home and continuing get adjusted to it. She discussed Home health PT has worked with him on breathing technique with using inogen.  Wife discussed hernia at site of recent  mesh removal at left flank area ,reports no further intervention by PCP. Patient spouse states he has completed home health RN , and states therapy occupational therapy completed, she discussed exercises aggravated left side pain.  Wife discussed patient continues to use portable pedal stationary bike for his legs and arms.  Wife discussed she has placed call to insurance company regarding in network podiatrist in area, reports patient toe nails long , thick and she thinks he has fungus on nails.    Objective:  BP 120/70 (BP Location: Right Arm, Patient Position: Sitting, Cuff Size: Large)   Pulse 97   Resp 20   Ht 1.803 m (5' 11" )   Wt 216 lb 4.8 oz (98.1 kg)   SpO2 98%   BMI 30.17 kg/m   Review of Systems  Constitutional: Negative.   HENT: Negative.   Eyes: Negative.   Respiratory: Positive for cough.   Cardiovascular: Positive for leg swelling.  Gastrointestinal: Negative.   Genitourinary: Negative.   Musculoskeletal: Positive for back pain.  Skin: Negative.   Neurological: Negative.   Endo/Heme/Allergies: Negative.   Psychiatric/Behavioral: Negative.     Physical Exam  Constitutional: He is oriented to person, place, and time. He appears well-developed and well-nourished.  Cardiovascular: Normal rate and normal heart sounds.  Respiratory:  Effort normal.  GI: Soft.  Neurological: He is alert and oriented to person, place, and time.  Skin: Skin is warm and dry.  Left flank area with noted bulging area  Toe nail long, thick, dark tan discolored.  Psychiatric: He has a normal mood and affect. His behavior is normal. Judgment and thought content normal.    Encounter Medications:   Outpatient Encounter Medications as of 12/02/2017  Medication Sig Note  . acetaminophen (TYLENOL) 500 MG tablet Take 1,000 mg by mouth.   Marland Kitchen albuterol (PROVENTIL HFA;VENTOLIN HFA) 108 (90 Base) MCG/ACT inhaler Inhale 2 puffs into the lungs every 4 (four) hours as needed for wheezing or shortness of breath. Reported on 10/26/2015   . buPROPion (WELLBUTRIN) 75 MG tablet Take 75 mg by mouth 2 (two) times daily. Reported on 10/26/2015   . Cholecalciferol (VITAMIN D3) 2000 units capsule TK 1 C PO D   . clonazePAM (KLONOPIN) 0.5 MG tablet Take 0.5 mg by mouth 3 (three) times daily as needed.    . Cyanocobalamin (VITAMELTS ENERGY VITAMIN B-12) 1500 MCG TBDP Take 1 tablet by mouth daily. (Patient taking differently: Take 1 tablet by mouth daily. Taking 2000 mcg daily)   . cyclobenzaprine (FLEXERIL) 10 MG tablet Take 1 tablet (10 mg total) by mouth 3 (three) times daily as needed for muscle spasms.   Marland Kitchen diltiazem (CARDIZEM SR) 90 MG 12 hr capsule takes 120 mg daily   . DULoxetine (CYMBALTA) 60 MG capsule Take 60 mg by mouth daily. Reported on 10/26/2015   . finasteride (PROSCAR) 5 MG tablet  Take 5 mg by mouth daily.   . fluticasone (FLONASE) 50 MCG/ACT nasal spray SHAKE LIQUID AND USE 2 SPRAYS IN EACH NOSTRIL DAILY   . furosemide (LASIX) 20 MG tablet Take one or two daily as directed., taking total of 80 mg daily   . gabapentin (NEURONTIN) 800 MG tablet Take by mouth.   Marland Kitchen ipratropium-albuterol (DUONEB) 0.5-2.5 (3) MG/3ML SOLN Take 3 mLs by nebulization every 4 (four) hours as needed.   . meloxicam (MOBIC) 15 MG tablet Take 1 tablet (15 mg total) by mouth daily.   .  metoprolol tartrate (LOPRESSOR) 25 MG tablet Take 1 tablet (25 mg total) by mouth 2 (two) times daily.   Marland Kitchen omeprazole (PRILOSEC) 40 MG capsule Take 40 mg by mouth 2 (two) times daily.   . polyethylene glycol (MIRALAX / GLYCOLAX) packet Take 17 g by mouth daily.   . Potassium Chloride ER 20 MEQ TBCR Take 1 tablet by mouth daily.    . predniSONE (DELTASONE) 10 MG tablet Take 1 tablet by mouth daily.    . promethazine (PHENERGAN) 25 MG tablet Take 25 mg by mouth every 6 (six) hours as needed.  09/23/2017: Has on hand if needed  . tamsulosin (FLOMAX) 0.4 MG CAPS capsule Take 0.4 mg by mouth daily.   . traMADol (ULTRAM) 50 MG tablet Take 1 tablet (50 mg total) by mouth every 6 (six) hours as needed.   . traZODone (DESYREL) 50 MG tablet Take 150 mg by mouth at bedtime.   . Fluticasone-Salmeterol (ADVAIR DISKUS) 250-50 MCG/DOSE AEPB Inhale into the lungs.   Marland Kitchen ibuprofen (ADVIL,MOTRIN) 200 MG tablet Take 400 mg by mouth.   Marland Kitchen ipratropium (ATROVENT HFA) 17 MCG/ACT inhaler Inhale 2 puffs into the lungs every 6 (six) hours.   Marland Kitchen tiotropium (SPIRIVA HANDIHALER) 18 MCG inhalation capsule Place into inhaler and inhale.   . Vitamin D, Ergocalciferol, (DRISDOL) 50000 units CAPS capsule Take 1 capsule (50,000 Units total) by mouth 2 (two) times a week x 6 weeks. (Patient not taking: Reported on 09/23/2017)    No facility-administered encounter medications on file as of 12/02/2017.     Functional Status:   In your present state of health, do you have any difficulty performing the following activities: 09/17/2017 01/13/2017  Hearing? N N  Vision? N N  Difficulty concentrating or making decisions? Y N  Walking or climbing stairs? Y Y  Comment using walker, forgetful at time  -  Dressing or bathing? [name]  Comment wife assist  -  Doing errands, shopping? Y Y  Comment wife/familly  assist with transporation  -  Preparing Food and eating ? N Y  Comment wife prepares wife helps  Using the Toilet? N N  In the past six  months, have you accidently leaked urine? N N  Do you have problems with loss of bowel control? N N  Managing your Medications? [name]  Comment wife helps  wife helps   Managing your Finances? [name]  Comment wife assist  -  Housekeeping or managing your Housekeeping? N Y  Comment wife does house keeping  -  Some recent data might be hidden    Fall/Depression Screening:    Fall Risk  09/23/2017 08/12/2017 07/30/2017  Falls in the past year? No No No  Risk for fall due to : Impaired balance/gait;Impaired mobility - -  Risk for fall due to: Comment - - -   PHQ 2/9 Scores 09/23/2017 08/12/2017 07/30/2017 06/09/2017 05/13/2017 03/04/2017 01/22/2017  PHQ -  2 Score 2 0 0 0 4 3 0  PHQ- 9 Score 4 - - - 12 10 -    Assessment:   Hypertension/Lower leg swelling  - Taking medication as prescribed, further cardiology follow pending . Continuing to monitor daily weights, no sudden increases in weight  ranging 216- 219 with leg swelling   with some improvement.  Will benefit from reinforcement of low salt diet management.  COPD in green zone, wearing oxygen at 2 liters at rest and 4 liters when active .  Recent left thoracic surgery  Surgical incision healed.    Discussed [facility] care management health coach program for continued education and support of chronic disease of COPD, patient is interested in program and end of transition of care follow up .  Wife denies any other concerns at this time.    Plan:  Will plan follow up call in the next 2 weeks assess,for new  concerns and  care coordination needs, if no  new needs identified will consider transition of disease management for management of chronic disease.    [facility] CM Care Plan Problem One     Most Recent Value  Care Plan Problem One  Risk for hospital admission related recent hospitalization related to Hernia repair   Role Documenting the Problem One  Care Management [place] for Problem One  Active  [facility]N Long Term Goal   Patient will not  experiene a hosptial admission in the next 90  days  [goal updated for complex care managment ]  THN Long Term Goal Start Date  09/17/17  Interventions for Problem One Long Term Goal  Advised regarding continuing taking medicaitions as prescribed, teachback on weight increases to notify MD of, 3 pounds in a day  5 pounds in a week, COPD worsening symptoms of increased shortness of breath , cough , changes in sputum color.    [facility] CM Short Term Goal #1   Patient will attend all medical appointments in the next 18 days  [facility] CM Short Term Goal #1 Start Date  09/17/17  [facility] CM Short Term Goal #1 Met Date  10/15/17  [facility] CM Short Term Goal #2   Patient/wife will be able to report sign of infection to notify MD of in the next 14 days   [facility]N CM Short Term Goal #2 Start Date  09/17/17  [facility] CM Short Term Goal #2 Met Date  10/07/17  [facility] CM Short Term Goal #3  Patient will begin to weigh daily and keep a record in the next 30 days   [facility] CM Short Term Goal #3 Start Date  09/23/17  [facility] CM Short Term Goal #3 Met Date  10/19/17  [facility] CM Short Term Goal #4  Over the next 14 days patient/wife will begin to monitor blood pressure daily and keep a record.   [facility] CM Short Term Goal #4 Start Date  10/15/17  [facility] CM Short Term Goal #4 Met Date  11/19/17  [facility] CM Short Term Goal #5   Patient will report increased knowledge on low sodium diet information in the next 30 days   [facility] CM Short Term Goal #5 Start Date  11/06/17  Interventions for Short Term Goal #5  Provided [facility] high/low salt handout and reviewed label reading, daily sodium limits, discussed rationale how salt affects weight gain        [name], RN, [place] Management Coordinator  [phone]- Mobile [phone]- [place]

## 2017-12-15 ENCOUNTER — Other Ambulatory Visit: Payer: Self-pay | Admitting: *Deleted

## 2017-12-15 NOTE — Patient Outreach (Addendum)
  [name]) Care Management  12/15/2017  Stanley Rios. September 16, 1955 [id number]   Referral received on 09/17/17 Referral source : HTA Referral reason : DC from [facility] on 2/19, S/P surgeryleft thoracotomy and removal of mesh  62 year old male with PMHthat includes but limited toCOPD, Chronic pain syndrome, lung lesion , hypertension , anxiety  Successful outreach call to patient wife, HIPAA verified, wife discussed recently receiving results of 2D echo from prior cardiology visit. Wife reports results indicates heart function is 40%. She is discussed patient has been a little down about result, wife discussed being anxious and wanting to know about what to do help patient.  She discussed patient tires easily, using scooter to transfer from home down ramp to car. Wife discussed patient oxygen saturation is 98% but she feel it is lower when he is walking outside home with inogen, he uses small concentrator in home.   Wife discussed patient is having more discomfort at left side at  previous hernia repair mesh removal, patient is using a pillow to hold over area when coughing, she discussed PCP states there is no further surgery to be done.   Patient has follow up appointment with cardiology, in the next week and wife will discuss further plans.   Assessment. Patient has completed 90 day transition of care, will benefit from continued community care management involvement with face to face visit per request, instead, of transition to health coach.    Plan  Will continue care management involvement, Will plan follow call in the next week and plan for home visit in the next month.  Care planning and goal setting .   [name], RN, [place] Management Coordinator  [phone]- Mobile [phone]- Toll Free Main Office

## 2017-12-22 ENCOUNTER — Encounter: Payer: Self-pay | Admitting: *Deleted

## 2017-12-23 DIAGNOSIS — R600 Localized edema: Secondary | ICD-10-CM | POA: Diagnosis not present

## 2017-12-31 DIAGNOSIS — J449 Chronic obstructive pulmonary disease, unspecified: Secondary | ICD-10-CM | POA: Diagnosis not present

## 2017-12-31 DIAGNOSIS — I10 Essential (primary) hypertension: Secondary | ICD-10-CM | POA: Diagnosis not present

## 2017-12-31 DIAGNOSIS — R4182 Altered mental status, unspecified: Secondary | ICD-10-CM | POA: Diagnosis not present

## 2017-12-31 DIAGNOSIS — Z87891 Personal history of nicotine dependence: Secondary | ICD-10-CM | POA: Diagnosis not present

## 2017-12-31 DIAGNOSIS — R3916 Straining to void: Secondary | ICD-10-CM | POA: Diagnosis not present

## 2017-12-31 DIAGNOSIS — R079 Chest pain, unspecified: Secondary | ICD-10-CM | POA: Diagnosis not present

## 2017-12-31 DIAGNOSIS — R05 Cough: Secondary | ICD-10-CM | POA: Diagnosis not present

## 2017-12-31 DIAGNOSIS — Z9889 Other specified postprocedural states: Secondary | ICD-10-CM | POA: Diagnosis not present

## 2017-12-31 DIAGNOSIS — Z7951 Long term (current) use of inhaled steroids: Secondary | ICD-10-CM | POA: Diagnosis not present

## 2017-12-31 DIAGNOSIS — E559 Vitamin D deficiency, unspecified: Secondary | ICD-10-CM | POA: Diagnosis not present

## 2017-12-31 DIAGNOSIS — M4802 Spinal stenosis, cervical region: Secondary | ICD-10-CM | POA: Diagnosis not present

## 2017-12-31 DIAGNOSIS — Z79899 Other long term (current) drug therapy: Secondary | ICD-10-CM | POA: Diagnosis not present

## 2017-12-31 DIAGNOSIS — R600 Localized edema: Secondary | ICD-10-CM | POA: Diagnosis not present

## 2017-12-31 DIAGNOSIS — E785 Hyperlipidemia, unspecified: Secondary | ICD-10-CM | POA: Diagnosis not present

## 2017-12-31 DIAGNOSIS — G894 Chronic pain syndrome: Secondary | ICD-10-CM | POA: Diagnosis not present

## 2017-12-31 DIAGNOSIS — I739 Peripheral vascular disease, unspecified: Secondary | ICD-10-CM | POA: Diagnosis not present

## 2017-12-31 DIAGNOSIS — M2550 Pain in unspecified joint: Secondary | ICD-10-CM | POA: Diagnosis not present

## 2018-01-01 ENCOUNTER — Other Ambulatory Visit: Payer: Self-pay | Admitting: *Deleted

## 2018-01-01 NOTE — Patient Outreach (Signed)
[facility] [facility]) Care Management  01/01/2018  Stanley Rios. 1955-08-31 [id number]   Telephone follow up call    Referral received on 09/17/17 Referral source : HTA Referral reason : DC from [facility] on 2/19, S/P surgeryleft thoracotomy and removal of mesh  62 year old male with PMHthat includes but limited toCOPD, Chronic pain syndrome, lung lesion , hypertension , anxiety. Heart failure EF 45%, lower extremity edema  Successful outreach call to patient wife, HIPAA identify verified.  Wife discussed patient feeling better this week compared to last week. She reviewed recent events and follow up with cardiology at [facility]. Wife reports patient was started on lisinopril and lipitor recently t and patient did not tolerate well, he was lethargic, no appetite, decreased urine output she stopped new medication and  notified cardiology office.  she scheduled a return office visit on yesterday and medication changes have been made. Patient Lisinopril and Lipitor have been discontinued and patient and prescription for  Lorsartan written  And she states receiving a phone call today  that his lab work was okay to begin taking Lorsartan.   Wife discussed patient recent with of 220.9 on today and 220 on yesterday, patient has more swelling in right leg and they are awaiting result of study done to rule out blood clot.  Wife discussed patient blood pressure have been doing well 113/70 today.   Wt 220 lb 14.4 oz (100.2 kg)   BMI 30.81 kg/m   Wife reports patient appetite is still not good , mostly drinking ensure so far today, he is tolerating being up in living room in recliner, low energy not able to do exercises. Patient continues to wear oxygen at 3 liters nasal cannula and taking inhalers as prescribed. Wife discussed she feels patient condition is getting weaker, with his lung and heart condition. Wife asking about Hospice services as she is  familiar with her brother is on  and stepfather in law recently on. She discussed that she is not sure if patient is ready for that yet, patient  just doesn't want to talk about it, states he does have a living will and she want to honor that. Wife is interested in learning more about palliative services as beginning conversation with her about differences. Wife states she will also make a sooner office visit to PCP office to follow up on recent events.    Plan Will place call to Hospice and palliative care office, spoke with [facility] regarding patient wife interest in having more information on palliative care program, provided patient wife contact as requested by Madelaine Bhat.  Will plan follow up call in the next week, reviewed worsening symptoms of shortness of breath swelling, weight gain of 2 pounds in a day or 5 in a week.  to notify MD of sooner  Placed call to PCP office regarding wife concerns and questions regarding Hospice services and she plan office visit to discuss with PCP, able to speak with Anderson Malta at [facility] office . Will send PCP office this telephone visit note.   [name], RN, [place] Management Coordinator  [phone]- Mobile [phone]- Toll Free Main Office

## 2018-01-01 NOTE — Patient Outreach (Signed)
[place] [facility]) Care Management  01/01/2018  [name]. 20-Aug-1955 [id number]   Incoming call for Stanley Rios from Palliative and hospice care of [place], reports she has spoken with Mrs.Arvie regarding education on palliative care  and wife is requesting palliative care services. Referral coordinator from Palliative care office will contact PCP office for formal referral for palliative services.    [name], RN, [place] Management Coordinator  [phone]- Mobile [phone]- Toll Free Main Office

## 2018-01-08 ENCOUNTER — Other Ambulatory Visit: Payer: Self-pay | Admitting: *Deleted

## 2018-01-08 NOTE — Patient Outreach (Signed)
Stanley Rios [facility]) Care Management   01/08/2018  Stanley Rios. 08-09-1955 [id number]  Stanley Rios. is an 62 y.o. male  Subjective:  Patient reports feeling a little better on today.  Patient discussed still having some swelling in his legs but it has decreased. Patient discussed his wife does not let him have salt like he used to.    Objective:  BP 112/60 (BP Location: Right Arm, Patient Position: Sitting, Cuff Size: Large)   Pulse 92   Resp 20   Ht 1.803 m (5\' 11" )   Wt 219 lb (99.3 kg)   SpO2 97%   BMI 30.54 kg/m  Review of Systems  Constitutional: Negative.   HENT: Negative.   Eyes: Negative.   Respiratory: Positive for shortness of breath.   Cardiovascular: Positive for leg swelling.  Gastrointestinal: Negative.   Genitourinary: Negative.   Musculoskeletal: Negative.   Skin: Negative.   Neurological: Negative.   Endo/Heme/Allergies: Negative.   Psychiatric/Behavioral: Negative.     Physical Exam  Constitutional: He is oriented to person, place, and time. He appears well-developed and well-nourished.  Cardiovascular: Normal heart sounds.  Respiratory: Effort normal and breath sounds normal.  GI: Soft.  Neurological: He is alert and oriented to person, place, and time.  Skin: Skin is warm and dry.  Psychiatric: He has a normal mood and affect. His behavior is normal. Judgment and thought content normal.    Encounter Medications:   Outpatient Encounter Medications as of 01/08/2018  Medication Sig Note  . acetaminophen (TYLENOL) 500 MG tablet Take 1,000 mg by mouth.   Marland Kitchen albuterol (PROVENTIL HFA;VENTOLIN HFA) 108 (90 Base) MCG/ACT inhaler Inhale 2 puffs into the lungs every 4 (four) hours as needed for wheezing or shortness of breath. Reported on 10/26/2015   . buPROPion (WELLBUTRIN) 75 MG tablet Take 75 mg by mouth 2 (two) times daily. Reported on 10/26/2015   . Cholecalciferol (VITAMIN D3) 2000 units capsule TK 1 C PO D   . clonazePAM  (KLONOPIN) 0.5 MG tablet Take 0.5 mg by mouth 3 (three) times daily as needed.    . Cyanocobalamin (VITAMELTS ENERGY VITAMIN B-12) 1500 MCG TBDP Take 1 tablet by mouth daily. (Patient taking differently: Take 1 tablet by mouth daily. Taking 2000 mcg daily)   . cyclobenzaprine (FLEXERIL) 10 MG tablet Take 1 tablet (10 mg total) by mouth 3 (three) times daily as needed for muscle spasms.   Marland Kitchen diltiazem (CARDIZEM SR) 90 MG 12 hr capsule takes 120 mg daily   . DULoxetine (CYMBALTA) 60 MG capsule Take 60 mg by mouth daily. Reported on 10/26/2015   . finasteride (PROSCAR) 5 MG tablet Take 5 mg by mouth daily.   . fluticasone (FLONASE) 50 MCG/ACT nasal spray SHAKE LIQUID AND USE 2 SPRAYS IN EACH NOSTRIL DAILY   . Fluticasone-Salmeterol (ADVAIR DISKUS) 250-50 MCG/DOSE AEPB Inhale into the lungs.   . furosemide (LASIX) 20 MG tablet Take one or two daily as directed., taking total of 80 mg daily   . gabapentin (NEURONTIN) 800 MG tablet Take by mouth.   Marland Kitchen ibuprofen (ADVIL,MOTRIN) 200 MG tablet Take 400 mg by mouth.   Marland Kitchen ipratropium (ATROVENT HFA) 17 MCG/ACT inhaler Inhale 2 puffs into the lungs every 6 (six) hours.   Marland Kitchen ipratropium-albuterol (DUONEB) 0.5-2.5 (3) MG/3ML SOLN Take 3 mLs by nebulization every 4 (four) hours as needed.   . meloxicam (MOBIC) 15 MG tablet Take 1 tablet (15 mg total) by mouth daily.   . metoprolol tartrate (  LOPRESSOR) 25 MG tablet Take 1 tablet (25 mg total) by mouth 2 (two) times daily.   Marland Kitchen omeprazole (PRILOSEC) 40 MG capsule Take 40 mg by mouth 2 (two) times daily.   . polyethylene glycol (MIRALAX / GLYCOLAX) packet Take 17 g by mouth daily.   . Potassium Chloride ER 20 MEQ TBCR Take 1 tablet by mouth daily.    . predniSONE (DELTASONE) 10 MG tablet Take 1 tablet by mouth daily.    . promethazine (PHENERGAN) 25 MG tablet Take 25 mg by mouth every 6 (six) hours as needed.  09/23/2017: Has on hand if needed  . tamsulosin (FLOMAX) 0.4 MG CAPS capsule Take 0.4 mg by mouth daily.   Marland Kitchen  tiotropium (SPIRIVA HANDIHALER) 18 MCG inhalation capsule Place into inhaler and inhale.   . traMADol (ULTRAM) 50 MG tablet Take 1 tablet (50 mg total) by mouth every 6 (six) hours as needed.   . traZODone (DESYREL) 50 MG tablet Take 150 mg by mouth at bedtime.   . Vitamin D, Ergocalciferol, (DRISDOL) 50000 units CAPS capsule Take 1 capsule (50,000 Units total) by mouth 2 (two) times a week x 6 weeks. (Patient not taking: Reported on 09/23/2017)    No facility-administered encounter medications on file as of 01/08/2018.     Functional Status:   In your present state of health, do you have any difficulty performing the following activities: 09/17/2017 01/13/2017  Hearing? N N  Vision? N N  Difficulty concentrating or making decisions? Y N  Walking or climbing stairs? Y Y  Comment using walker, forgetful at time  -  Dressing or bathing? [name]  Comment wife assist  -  Doing errands, shopping? Y Y  Comment wife/familly  assist with transporation  -  Preparing Food and eating ? N Y  Comment wife prepares wife helps  Using the Toilet? N N  In the past six months, have you accidently leaked urine? N N  Do you have problems with loss of bowel control? N N  Managing your Medications? [name]  Comment wife helps  wife helps   Managing your Finances? [name]  Comment wife assist  -  Housekeeping or managing your Housekeeping? N Y  Comment wife does house keeping  -  Some recent data might be hidden    Fall/Depression Screening:    Fall Risk  09/23/2017 08/12/2017 07/30/2017  Falls in the past year? No No No  Risk for fall due to : Impaired balance/gait;Impaired mobility - -  Risk for fall due to: Comment - - -   PHQ 2/9 Scores 09/23/2017 08/12/2017 07/30/2017 06/09/2017 05/13/2017 03/04/2017 01/22/2017  PHQ - 2 Score 2 0 0 0 4 3 0  PHQ- 9 Score 4 - - - 12 10 -    Assessment:  Routine home visit .  Heart Failure - weighing daily, some swelling in legs decreasing, limiting salt in diet . Needs encouragement  with increased mobility in home, and HF education reinforcement.    COPD Patient reports being in usual state, report decreased use of rescue inhaler since using Trelegy. Continues with oxygent at 2 liters , reports increasing it to 4 liters when outside home due to increased shortness of breath with exertion. Wife encourages patient with increasing activity in home using his ,upper lower body cycle.   Patient reports taking medications as prescribed, and tolerating new medication Losartan much better than when on Lisinopril . Patient reports monitoring blood pressure reading and doing well  116/73  home reading today, he denies dizziness .  Patient discussed looking forward to going to podiatrist in the next week, to get long toe nails cut. Wife discussed they have changed their mind palliative care due to cost, co payments same of speciality visit, reports she discussed with Palliative care representative on yesterday .  Patient and wife agreeable to transition to health coach for continued education and support chronic conditions.   Plan:  Will plan transition to health coach at final call  visit in a week.    THN CM Care Plan Problem One     Most Recent Value  Care Plan Problem One  Recent diagnosis of Heart failure with EF 40 %  Role Documenting the Problem One  Care Management Coordinator  Care Plan for Problem One  Active  THN Long Term Goal   Patient will report increased knowledge of Heart failure self care management measues in the next 60 days   THN Long Term Goal Start Date  12/15/17  Interventions for Problem One Long Term Goal  Discussed self managment care of COPD, taking medications daily, eating low salt diet, wife able to states daily limit of 2 gm,weighing daily, and moving as toleratedl.  THN CM Short Term Goal #1   Patient will weigh daily and keep a record in the next 30 days   THN CM Short Term Goal #1 Start Date  12/15/17  Interventions for Short Term Goal #1   REviewed weight chart and encouraged to continue to monitor daily   THN CM Short Term Goal #2   Patient/wife will be able to report at least 3 symptoms of worsening heart symptoms and aciton plan in the next 30 days   THN CM Short Term Goal #2 Start Date  12/16/17  Interventions for Short Term Goal #2  Provided and reviewed education book on Living with heart failure , reviewed zone chart with action plan       [name], RN, [place] Management Coordinator  [phone]- Mobile [phone]- [place] Office

## 2018-01-13 DIAGNOSIS — Z08 Encounter for follow-up examination after completed treatment for malignant neoplasm: Secondary | ICD-10-CM | POA: Diagnosis not present

## 2018-01-13 DIAGNOSIS — Z85118 Personal history of other malignant neoplasm of bronchus and lung: Secondary | ICD-10-CM | POA: Diagnosis not present

## 2018-01-13 DIAGNOSIS — J449 Chronic obstructive pulmonary disease, unspecified: Secondary | ICD-10-CM | POA: Diagnosis not present

## 2018-01-13 DIAGNOSIS — Z9981 Dependence on supplemental oxygen: Secondary | ICD-10-CM | POA: Diagnosis not present

## 2018-01-13 DIAGNOSIS — R911 Solitary pulmonary nodule: Secondary | ICD-10-CM | POA: Diagnosis not present

## 2018-01-13 DIAGNOSIS — R079 Chest pain, unspecified: Secondary | ICD-10-CM | POA: Diagnosis not present

## 2018-01-15 ENCOUNTER — Other Ambulatory Visit: Payer: Self-pay | Admitting: *Deleted

## 2018-01-15 NOTE — Patient Outreach (Addendum)
[facility] [facility]) Care Management  01/15/2018  Stanley Rios. [birth year] [id number]  Telephone follow up call   Referral received on 09/17/17 Referral source : HTA Referral reason : DC from [facility] on 2/19, S/P surgeryleft thoracotomy and removal of mesh  62 year old male with PMHthat includes but limited toCOPD, Chronic pain syndrome, lung lesion , hypertension , anxiety. Heart failure EF 45%, lower extremity edema  Unsuccessful outreach call to patient , able to leave a HIPAA compliant message for return call.   Plan  Will await return call response .  Will plan return call in the next 4 business days if no return call    [name], RN, [place] Management Coordinator  [phone]- Mobile [phone]- [place]

## 2018-01-21 ENCOUNTER — Other Ambulatory Visit: Payer: Self-pay | Admitting: *Deleted

## 2018-01-21 NOTE — Patient Outreach (Addendum)
[place] [facility]) Care Management  01/21/2018  Stanley Rios. [birth year] [id number]  Referral received on 09/17/17 Referral source : HTA Referral reason : DC from [facility] on 2/19, S/P surgeryleft thoracotomy and removal of mesh  62 year old male with PMHthat includes but limited toCOPD, Chronic pain syndrome, lung lesion , hypertension , anxiety. Heart failure EF 45%, lower extremity edema  Successful outreach call to patient spouse Stanley Rios, HIPAA verified.  Wife discussed concern regarding patient condition she discussed he is having more shortness of breath with activity at times he is wearing oxygen at 3 liters at rest with saturations 97%, she has too increase oxygen to 5 liters to help patient to breathing  when walking , She reports decreased tolerance with walking down ramp as usual reports she tries to get patient to do purse lip breathing technique as learned at pulmonary rehab. She denies increase in cough or discolored sputum production.  Wife discussed patient still has some swelling in his legs, but not any worse. She discussed patient weight is up from  221 to 228  over the last 2 weeks.  she reports patient wakes up eating during the night she believes weight gain is related to patient eating more as she has discussed with MD .  She reports that she has discussed this with [place] office and has arranged a sooner office visit for July 3.  Wife reports patient continues to take medications as prescribed.  She discussed patient has follow up appointment with pain clinic on 7/1, she reports patient has pain at left side at previous hernia where mesh was removed.    Wife reports patient had good report with follow up appointment with oncology follow regarding  right lung nodule, s/p radiation therapy, next follow up in 6 months.  Wife discussed concern regarding patient general condition, she is questioning Hospice services and wondering if  Dr.Heffington would be able to determine if patient is eligible, she has been reviewing the qualification and plan to discuss with Dr. Guadelupe Sabin.   Discussed continuing to follow patient in community for complex care management of chronic conditions of COPD and Heart Failure wife is agreeable.   Plan Will plan follow up call in the next week,  Reinforced with wife worsening symptoms of COPD to notify MD of  Increased shortness of breath , cough,  Productive cough with discolor, temperature. Reinforced with wife continuing daily weights and notifying MD of sudden weight gain of 3 pounds in a day or 5 in a week, increased swelling shortness of breath, increased oxygen requirements,  she verbalized understanding of 911 for red zone symptoms .   [name], RN, [place] Management Coordinator  [phone]- Mobile [phone]- Toll Free Main Office

## 2018-01-25 ENCOUNTER — Other Ambulatory Visit: Payer: Self-pay

## 2018-01-25 ENCOUNTER — Ambulatory Visit: Payer: PPO | Attending: Nurse Practitioner | Admitting: Nurse Practitioner

## 2018-01-25 ENCOUNTER — Encounter: Payer: Self-pay | Admitting: Nurse Practitioner

## 2018-01-25 VITALS — BP 125/80 | HR 98 | Temp 97.6°F | Ht 72.0 in | Wt 218.0 lb

## 2018-01-25 DIAGNOSIS — M533 Sacrococcygeal disorders, not elsewhere classified: Secondary | ICD-10-CM | POA: Diagnosis not present

## 2018-01-25 DIAGNOSIS — G894 Chronic pain syndrome: Secondary | ICD-10-CM

## 2018-01-25 DIAGNOSIS — M79605 Pain in left leg: Secondary | ICD-10-CM | POA: Insufficient documentation

## 2018-01-25 DIAGNOSIS — M4802 Spinal stenosis, cervical region: Secondary | ICD-10-CM | POA: Diagnosis not present

## 2018-01-25 DIAGNOSIS — G8912 Acute post-thoracotomy pain: Secondary | ICD-10-CM | POA: Diagnosis not present

## 2018-01-25 DIAGNOSIS — I10 Essential (primary) hypertension: Secondary | ICD-10-CM | POA: Insufficient documentation

## 2018-01-25 DIAGNOSIS — J449 Chronic obstructive pulmonary disease, unspecified: Secondary | ICD-10-CM | POA: Diagnosis not present

## 2018-01-25 DIAGNOSIS — M5030 Other cervical disc degeneration, unspecified cervical region: Secondary | ICD-10-CM | POA: Insufficient documentation

## 2018-01-25 DIAGNOSIS — G8929 Other chronic pain: Secondary | ICD-10-CM | POA: Diagnosis present

## 2018-01-25 DIAGNOSIS — M961 Postlaminectomy syndrome, not elsewhere classified: Secondary | ICD-10-CM | POA: Diagnosis not present

## 2018-01-25 DIAGNOSIS — M5136 Other intervertebral disc degeneration, lumbar region: Secondary | ICD-10-CM | POA: Insufficient documentation

## 2018-01-25 DIAGNOSIS — M545 Low back pain: Secondary | ICD-10-CM | POA: Insufficient documentation

## 2018-01-25 DIAGNOSIS — K429 Umbilical hernia without obstruction or gangrene: Secondary | ICD-10-CM | POA: Insufficient documentation

## 2018-01-25 DIAGNOSIS — M159 Polyosteoarthritis, unspecified: Secondary | ICD-10-CM

## 2018-01-25 DIAGNOSIS — M79604 Pain in right leg: Secondary | ICD-10-CM | POA: Diagnosis not present

## 2018-01-25 DIAGNOSIS — M7918 Myalgia, other site: Secondary | ICD-10-CM | POA: Diagnosis not present

## 2018-01-25 DIAGNOSIS — Z79891 Long term (current) use of opiate analgesic: Secondary | ICD-10-CM | POA: Diagnosis not present

## 2018-01-25 DIAGNOSIS — E538 Deficiency of other specified B group vitamins: Secondary | ICD-10-CM | POA: Diagnosis not present

## 2018-01-25 DIAGNOSIS — M25511 Pain in right shoulder: Secondary | ICD-10-CM | POA: Diagnosis not present

## 2018-01-25 DIAGNOSIS — J9611 Chronic respiratory failure with hypoxia: Secondary | ICD-10-CM | POA: Diagnosis not present

## 2018-01-25 DIAGNOSIS — M150 Primary generalized (osteo)arthritis: Secondary | ICD-10-CM

## 2018-01-25 DIAGNOSIS — Z720 Tobacco use: Secondary | ICD-10-CM | POA: Insufficient documentation

## 2018-01-25 DIAGNOSIS — K449 Diaphragmatic hernia without obstruction or gangrene: Secondary | ICD-10-CM | POA: Insufficient documentation

## 2018-01-25 DIAGNOSIS — M47816 Spondylosis without myelopathy or radiculopathy, lumbar region: Secondary | ICD-10-CM | POA: Diagnosis not present

## 2018-01-25 DIAGNOSIS — K589 Irritable bowel syndrome without diarrhea: Secondary | ICD-10-CM | POA: Insufficient documentation

## 2018-01-25 DIAGNOSIS — M25512 Pain in left shoulder: Secondary | ICD-10-CM | POA: Diagnosis not present

## 2018-01-25 DIAGNOSIS — R531 Weakness: Secondary | ICD-10-CM | POA: Diagnosis not present

## 2018-01-25 MED ORDER — LIDOCAINE 5 % EX OINT: 1.0000 "application " | TOPICAL_OINTMENT | Freq: Four times a day (QID) | CUTANEOUS | 2 refills | Status: DC | PRN

## 2018-01-25 MED ORDER — TRAMADOL HCL 50 MG PO TABS: 50.0000 mg | ORAL_TABLET | Freq: Four times a day (QID) | ORAL | 5 refills | Status: AC | PRN

## 2018-01-25 MED ORDER — GABAPENTIN 800 MG PO TABS: 800.0000 mg | ORAL_TABLET | Freq: Three times a day (TID) | ORAL | 5 refills | Status: AC

## 2018-01-25 MED ORDER — CYCLOBENZAPRINE HCL 10 MG PO TABS: 10.0000 mg | ORAL_TABLET | Freq: Three times a day (TID) | ORAL | 0 refills | Status: DC | PRN

## 2018-01-25 MED ORDER — MELOXICAM 15 MG PO TABS: 15.0000 mg | ORAL_TABLET | Freq: Every day | ORAL | 0 refills | Status: DC

## 2018-01-25 NOTE — Patient Instructions (Addendum)
____________________________________________________________________________________________  Medication Rules  Applies to: All patients receiving prescriptions (written or electronic).  Pharmacy of record: Pharmacy where electronic prescriptions will be sent. If written prescriptions are taken to a different pharmacy, please inform the nursing staff. The pharmacy listed in the electronic medical record should be the one where you would like electronic prescriptions to be sent.  Prescription refills: Only during scheduled appointments. Applies to both, written and electronic prescriptions.  NOTE: The following applies primarily to controlled substances (Opioid* Pain Medications).   Patient's responsibilities: 1. Pain Pills: Bring all pain pills to every appointment (except for procedure appointments). 2. Pill Bottles: Bring pills in original pharmacy bottle. Always bring newest bottle. Bring bottle, even if empty. 3. Medication refills: You are responsible for knowing and keeping track of what medications you need refilled. The day before your appointment, write a list of all prescriptions that need to be refilled. Bring that list to your appointment and give it to the admitting nurse. Prescriptions will be written only during appointments. If you forget a medication, it will not be "Called in", "Faxed", or "electronically sent". You will need to get another appointment to get these prescribed. 4. Prescription Accuracy: You are responsible for carefully inspecting your prescriptions before leaving our office. Have the discharge nurse carefully go over each prescription with you, before taking them home. Make sure that your name is accurately spelled, that your address is correct. Check the name and dose of your medication to make sure it is accurate. Check the number of pills, and the written instructions to make sure they are clear and accurate. Make sure that you are given enough medication to last  until your next medication refill appointment. 5. Taking Medication: Take medication as prescribed. Never take more pills than instructed. Never take medication more frequently than prescribed. Taking less pills or less frequently is permitted and encouraged, when it comes to controlled substances (written prescriptions).  6. Inform other Doctors: Always inform, all of your healthcare providers, of all the medications you take. 7. Pain Medication from other Providers: You are not allowed to accept any additional pain medication from any other Doctor or Healthcare provider. There are two exceptions to this rule. (see below) In the event that you require additional pain medication, you are responsible for notifying us, as stated below. 8. Medication Agreement: You are responsible for carefully reading and following our Medication Agreement. This must be signed before receiving any prescriptions from our practice. Safely store a copy of your signed Agreement. Violations to the Agreement will result in no further prescriptions. (Additional copies of our Medication Agreement are available upon request.) 9. Laws, Rules, & Regulations: All patients are expected to follow all Federal and State Laws, Statutes, Rules, & Regulations. Ignorance of the Laws does not constitute a valid excuse. The use of any illegal substances is prohibited. 10. Adopted CDC guidelines & recommendations: Target dosing levels will be at or below 60 MME/day. Use of benzodiazepines** is not recommended.  Exceptions: There are only two exceptions to the rule of not receiving pain medications from other Healthcare Providers. 1. Exception #1 (Emergencies): In the event of an emergency (i.e.: accident requiring emergency care), you are allowed to receive additional pain medication. However, you are responsible for: As soon as you are able, call our office [phone], at any time of the day or night, and leave a message stating your name, the  date and nature of the emergency, and the name and dose of the medication   prescribed. In the event that your call is answered by a member of our staff, make sure to document and save the date, time, and the name of the person that took your information.  2. Exception #2 (Planned Surgery): In the event that you are scheduled by another doctor or dentist to have any type of surgery or procedure, you are allowed (for a period no longer than 30 days), to receive additional pain medication, for the acute post-op pain. However, in this case, you are responsible for picking up a copy of our "Post-op Pain Management for Surgeons" handout, and giving it to your surgeon or dentist. This document is available at our office, and does not require an appointment to obtain it. Simply go to our office during business hours (Monday-Thursday from 8:00 AM to 4:00 PM) (Friday 8:00 AM to 12:00 Noon) or if you have a scheduled appointment with [place], prior to your surgery, and ask for it by name. In addition, you will need to provide [place] with your name, name of your surgeon, type of surgery, and date of procedure or surgery.  *Opioid medications include: morphine, codeine, oxycodone, oxymorphone, hydrocodone, hydromorphone, meperidine, tramadol, tapentadol, buprenorphine, fentanyl, methadone. **Benzodiazepine medications include: diazepam (Valium), alprazolam (Xanax), clonazepam (Klonopine), lorazepam (Ativan), clorazepate (Tranxene), chlordiazepoxide (Librium), estazolam (Prosom), oxazepam (Serax), temazepam (Restoril), triazolam (Halcion) (Last updated: 09/24/2017) ____________________________________________________________________________________________   ____________________________________________________________________________________________  Preparing for Procedure with Sedation  Instructions: . Oral Intake: Do not eat or drink anything for at least 8 hours prior to your procedure. . Transportation: Public  transportation is not allowed. Bring an adult driver. The driver must be physically present in our waiting room before any procedure can be started. Marland Kitchen Physical Assistance: Bring an adult physically capable of assisting you, in the event you need help. This adult should keep you company at home for at least 6 hours after the procedure. . Blood Pressure Medicine: Take your blood pressure medicine with a sip of water the morning of the procedure. . Blood thinners:  . Diabetics on insulin: Notify the staff so that you can be scheduled 1st case in the morning. If your diabetes requires high dose insulin, take only  of your normal insulin dose the morning of the procedure and notify the staff that you have done so. . Preventing infections: Shower with an antibacterial soap the morning of your procedure. . Build-up your immune system: Take 1000 mg of Vitamin C with every meal (3 times a day) the day prior to your procedure. Marland Kitchen Antibiotics: Inform the staff if you have a condition or reason that requires you to take antibiotics before dental procedures. . Pregnancy: If you are pregnant, call and cancel the procedure. . Sickness: If you have a cold, fever, or any active infections, call and cancel the procedure. . Arrival: You must be in the facility at least 30 minutes prior to your scheduled procedure. . Children: Do not bring children with you. . Dress appropriately: Bring dark clothing that you would not mind if they get stained. . Valuables: Do not bring any jewelry or valuables.  Procedure appointments are reserved for interventional treatments only. Marland Kitchen No Prescription Refills. . No medication changes will be discussed during procedure appointments. . No disability issues will be discussed.  Remember:  Regular Business hours are:  Monday to Thursday 8:00 AM to 4:00 PM  Provider's Schedule: Milinda Pointer, MD:  Procedure days: Tuesday and Thursday 7:30 AM to 4:00 PM  Gillis Santa, MD:   Procedure days: Monday and  Wednesday 7:30 AM to 4:00 PM ____________________________________________________________________________________________  Radiofrequency Lesioning Radiofrequency lesioning is a procedure that is performed to relieve pain. The procedure is often used for back, neck, or arm pain. Radiofrequency lesioning involves the use of a machine that creates radio waves to make heat. During the procedure, the heat is applied to the nerve that carries the pain signal. The heat damages the nerve and interferes with the pain signal. Pain relief usually starts about 2 weeks after the procedure and lasts for 6 months to 1 year. Tell a health care provider about:  Any allergies you have.  All medicines you are taking, including vitamins, herbs, eye drops, creams, and over-the-counter medicines.  Any problems you or family members have had with anesthetic medicines.  Any blood disorders you have.  Any surgeries you have had.  Any medical conditions you have.  Whether you are pregnant or may be pregnant. What are the risks? Generally, this is a safe procedure. However, problems may occur, including:  Pain or soreness at the injection site.  Infection at the injection site.  Damage to nerves or blood vessels.  What happens before the procedure?  Ask your health care provider about: ? Changing or stopping your regular medicines. This is especially important if you are taking diabetes medicines or blood thinners. ? Taking medicines such as aspirin and ibuprofen. These medicines can thin your blood. Do not take these medicines before your procedure if your health care provider instructs you not to.  Follow instructions from your health care provider about eating or drinking restrictions.  Plan to have someone take you home after the procedure.  If you go home right after the procedure, plan to have someone with you for 24 hours. What happens during the procedure?  You  will be given one or more of the following: ? A medicine to help you relax (sedative). ? A medicine to numb the area (local anesthetic).  You will be awake during the procedure. You will need to be able to talk with the health care provider during the procedure.  With the help of a type of X-ray (fluoroscopy), the health care provider will insert a radiofrequency needle into the area to be treated.  Next, a wire that carries the radio waves (electrode) will be put through the radiofrequency needle. An electrical pulse will be sent through the electrode to verify the correct nerve. You will feel a tingling sensation, and you may have muscle twitching.  Then, the tissue that is around the needle tip will be heated by an electric current that is passed using the radiofrequency machine. This will numb the nerves.  A bandage (dressing) will be put on the insertion area after the procedure is done. The procedure may vary among health care providers and hospitals. What happens after the procedure?  Your blood pressure, heart rate, breathing rate, and blood oxygen level will be monitored often until the medicines you were given have worn off.  Return to your normal activities as directed by your health care provider. This information is not intended to replace advice given to you by your health care provider. Make sure you discuss any questions you have with your health care provider. Document Released: 03/12/2011 Document Revised: 12/20/2015 Document Reviewed: 08/21/2014 Elsevier Interactive Patient Education  [employer].

## 2018-01-25 NOTE — Progress Notes (Signed)
Nursing Pain Medication Assessment:  Safety precautions to be maintained throughout the outpatient stay will include: orient to surroundings, keep bed in low position, maintain call bell within reach at all times, provide assistance with transfer out of bed and ambulation.  Medication Inspection Compliance: Pill count conducted under aseptic conditions, in front of the patient. Neither the pills nor the bottle was removed from the patient's sight at any time. Once count was completed pills were immediately returned to the patient in their original bottle.  Medication: Tramadol (Ultram) Pill/Patch Count: 16 of 120 pills remain Pill/Patch Appearance: Markings consistent with prescribed medication Bottle Appearance: Standard pharmacy container. Clearly labeled. Filled Date: 6 / 1 / 2019 Last Medication intake:  Today

## 2018-01-25 NOTE — Progress Notes (Signed)
Patient's Name: Stanley Rios.  MRN: [id number]  Referring Provider: Marygrace Drought, MD  DOB: 1956-02-11  PCP: Marygrace Drought, MD  DOS: 01/25/2018  Note by: Vevelyn Francois NP  Service setting: Ambulatory outpatient  Specialty: Interventional Pain Management  Location: [facility] (AMB) Pain Management Facility    Patient type: Established    Primary Reason(s) for Visit: Encounter for prescription drug management. (Level of risk: moderate)  CC: Back Pain (lower)  HPI  Stanley Rios is a 62 y.o. year old, male patient, who comes today for a medication management evaluation. He has Opiate withdrawal (HCC); COPD (chronic obstructive pulmonary disease) ([place]); HTN (hypertension); Depression; Anxiety; Neutrophilic leukocytosis; DDD (degenerative disc disease), lumbar; Lumbar facet syndrome (Bilateral) (R>L); Sacroiliac joint pain (Bilateral) (R>L); DDD (degenerative disc disease), cervical; Cervical facet syndrome; Pressure injury of skin; Chronic airway obstruction ([place]); Chronic pain syndrome; Depressive disorder; Dry eye syndrome; Esophageal reflux; Hypertension; Insomnia; Irritable bowel syndrome (IBS); Chronic low back pain (Primary Source of Pain) (Bilateral) (R>L); Posterior subcapsular cataract, bilateral; Cervical post-laminectomy syndrome; Pulmonary emphysema ([place]); Seborrheic keratoses; Spinal stenosis in cervical region; Testosterone deficiency; Tobacco use disorder; Umbilical hernia; Vitamin D deficiency; Vitreomacular adhesion of right eye; Long term current use of opiate analgesic; Opiate use; Long term prescription opiate use; Chronic hip pain; Chronic shoulder pain (Tertiary source of pain) (Bilateral) (R>L); Lower extremity weakness; Chronic neck pain (Secondary source of pain) (Bilateral) (R>L); Chronic lower extremity pain (Bilateral) (R>L); B12 deficiency; Osteoarthritis; Neurogenic pain; Musculoskeletal pain; Diaphragmatic hernia; Chronic hypoxemic respiratory failure ([place]); Chronic narcotic  use; Intercostal neuropathic pain (Left); Post-thoracotomy pain syndrome (Left); Intercostal neuralgia (Left); Claudication ([place]); Chronic chest wall pain (Left); Nodule of right lung; Fatigue; Localized edema; Former tobacco use; and Foreign body in respiratory system on their problem list. His primarily concern today is the Back Pain (lower)  Pain Assessment: Location: Lower Back Radiating: Denies Onset: More than a month ago Duration: Chronic pain Quality: Burning, Aching, Nagging, Constant Severity: 4 /10 (subjective, self-reported pain score)  Note: Reported level is compatible with observation.                          Effect on ADL:   Timing: Constant Modifying factors: sitting down, medication, BP: 125/80  HR: 98  Stanley Rios was last scheduled for an appointment on 11/25/2017 for medication management. During today's appointment we reviewed Stanley Rios chronic pain status, as well as his outpatient medication regimen. He has been in [facility] hospital SP abdominal mesh. He did have some complications with infections. He admits that his lower back pain has returned. Also admits that he was unaware that the nerves would regenerate. He admits that he did have greater than 6 months relief with his previous radiofrequency ablation.  The patient  reports that he has current or past drug history. His body mass index is 29.57 kg/m.  Further details on both, my assessment(s), as well as the proposed treatment plan, please see below.  Controlled Substance Pharmacotherapy Assessment REMS (Risk Evaluation and Mitigation Strategy)  Analgesic:Tramadol 50 mg 1 tablet every 6 hours (200 mg/dayof tramadol) MME/day:39m/day.   Stanley Fischer RN  01/25/2018  1:28 PM  Sign at close encounter Nursing Pain Medication Assessment:  Safety precautions to be maintained throughout the outpatient stay will include: orient to surroundings, keep bed in low position, maintain call bell within reach at all  times, provide assistance with transfer out of bed and ambulation.  Medication Inspection Compliance: Pill count  conducted under aseptic conditions, in front of the patient. Neither the pills nor the bottle was removed from the patient's sight at any time. Once count was completed pills were immediately returned to the patient in their original bottle.  Medication: Tramadol (Ultram) Pill/Patch Count: 16 of 120 pills remain Pill/Patch Appearance: Markings consistent with prescribed medication Bottle Appearance: Standard pharmacy container. Clearly labeled. Filled Date: 6 / 1 / 2019 Last Medication intake:  Today   Pharmacokinetics: Liberation and absorption (onset of action): WNL Distribution (time to peak effect): WNL Metabolism and excretion (duration of action): WNL         Pharmacodynamics: Desired effects: Analgesia: Stanley Rios reports >50% benefit. Functional ability: Patient reports that medication allows him to accomplish basic ADLs Clinically meaningful improvement in function (CMIF): Sustained CMIF goals met Perceived effectiveness: Described as relatively effective, allowing for increase in activities of daily living (ADL) Undesirable effects: Side-effects or Adverse reactions: None reported Monitoring: [place] PMP: Online review of the past 12-monthperiod conducted. Compliant with practice rules and regulations Last UDS on record: Summary  Date Value Ref Range Status  07/30/2017 FINAL  Final    Comment:    ==================================================================== TOXASSURE SELECT 13 (MW) ==================================================================== Test                             Result       Flag       Units Drug Present and Declared for Prescription Verification   7-aminoclonazepam              77           EXPECTED   ng/mg creat    7-aminoclonazepam is an expected metabolite of clonazepam. Source    of clonazepam is a scheduled prescription  medication.   Tramadol                       >2747        EXPECTED   ng/mg creat   O-Desmethyltramadol            1861         EXPECTED   ng/mg creat   N-Desmethyltramadol            >2747        EXPECTED   ng/mg creat    Source of tramadol is a prescription medication.    O-desmethyltramadol and N-desmethyltramadol are expected    metabolites of tramadol. Drug Absent but Declared for Prescription Verification   Oxycodone                      Not Detected UNEXPECTED ng/mg creat ==================================================================== Test                      Result    Flag   Units      Ref Range   Creatinine              182              mg/dL      >=20 ==================================================================== Declared Medications:  The flagging and interpretation on this report are based on the  following declared medications.  Unexpected results may arise from  inaccuracies in the declared medications.  **Note: The testing scope of this panel includes these medications:  Clonazepam (Klonopin)  Oxycodone (Oxycodone Acetaminophen)  Tramadol  **Note: The testing scope of this panel does  not include following  reported medications:  Acetaminophen (Oxycodone Acetaminophen)  Acetaminophen (Tylenol)  Albuterol  Albuterol (Duoneb)  Bupropion (Wellbutrin)  Cyanocobalamin  Cyclobenzaprine (Flexeril)  Diclofenac (Voltaren)  Diltiazem (Cardizem SR)  Duloxetine (Cymbalta)  Finasteride (Proscar)  Fluticasone (Advair)  Fluticasone (Flonase)  Furosemide (Lasix)  Gabapentin (Neurontin)  Ibuprofen  Ipratropium (Atrovent)  Ipratropium (Duoneb)  Meloxicam (Mobic)  Metoprolol (Lopressor)  Omeprazole (Prilosec)  Polyethylene Glycol  Potassium  Prednisone  Promethazine  Salmeterol (Advair)  Tamsulosin  Tiotropium  Trazodone  Vitamin D2 (Drisdol)  Vitamin D3 ==================================================================== For clinical consultation, please  call [phone]. ====================================================================    UDS interpretation: Compliant          Medication Assessment Form: Reviewed. Patient indicates being compliant with therapy Treatment compliance: Compliant Risk Assessment Profile: Aberrant behavior: See prior evaluations. None observed or detected today Comorbid factors increasing risk of overdose: See prior notes. No additional risks detected today Risk of substance use disorder (SUD): Low  ORT Scoring interpretation table:  Score <3 = Low Risk for SUD  Score between 4-7 = Moderate Risk for SUD  Score >8 = High Risk for Opioid Abuse   Risk Mitigation Strategies:  Patient Counseling: Covered Patient-Prescriber Agreement (PPA): Present and active  Notification to other healthcare providers: Done  Pharmacologic Plan: No change in therapy, at this time.             Laboratory Chemistry  Inflammation Markers (CRP: Acute Phase) (ESR: Chronic Phase) Lab Results  Component Value Date   CRP <0.8 08/07/2016   ESRSEDRATE 24 (H) 08/07/2016   LATICACIDVEN 0.9 04/06/2016                         Rheumatology Markers No results found for: RF, ANA, LABURIC, URICUR, LYMEIGGIGMAB, LYMEABIGMQN, HLAB27                      Renal Function Markers Lab Results  Component Value Date   BUN 12 08/07/2016   CREATININE 0.90 08/07/2016   GFRAA >60 08/07/2016   GFRNONAA >60 08/07/2016                             Hepatic Function Markers Lab Results  Component Value Date   AST 20 08/07/2016   ALT 16 (L) 08/07/2016   ALBUMIN 3.6 08/07/2016   ALKPHOS 80 08/07/2016   LIPASE 16 01/16/2016                        Electrolytes Lab Results  Component Value Date   NA 142 08/07/2016   K 3.6 08/07/2016   CL 108 08/07/2016   CALCIUM 8.9 08/07/2016   MG 1.7 08/07/2016   PHOS 3.0 04/06/2016                        Neuropathy Markers Lab Results  Component Value Date   VITAMINB12 176 (L) 08/07/2016    HIV NON REACTIVE 04/10/2016                        Bone Pathology Markers Lab Results  Component Value Date   25OHVITD1 27 (L) 08/07/2016   25OHVITD2 20 08/07/2016   25OHVITD3 7.0 08/07/2016                         Coagulation  Parameters Lab Results  Component Value Date   INR 0.83 04/11/2016   LABPROT 11.4 04/11/2016   PLT 318 04/18/2016                        Cardiovascular Markers Lab Results  Component Value Date   TROPONINI <0.03 04/07/2016   HGB 12.5 (L) 04/18/2016   HCT 36.8 (L) 04/18/2016                         CA Markers No results found for: CEA, CA125, LABCA2                      Note: Lab results reviewed.  Recent Diagnostic Imaging Results  DG C-Arm 1-60 Min-No Report Fluoroscopy was utilized by the requesting physician.  No radiographic  interpretation.   Complexity Note: Imaging results reviewed. Results shared with Mr. Tallo, using Layman's terms.                         Meds   Current Outpatient Medications:  .  acetaminophen (TYLENOL) 500 MG tablet, Take 1,000 mg by mouth., Disp: , Rfl:  .  albuterol (PROVENTIL HFA;VENTOLIN HFA) 108 (90 Base) MCG/ACT inhaler, Inhale 2 puffs into the lungs every 4 (four) hours as needed for wheezing or shortness of breath. Reported on 10/26/2015, Disp: , Rfl:  .  buPROPion (WELLBUTRIN) 75 MG tablet, Take 75 mg by mouth 2 (two) times daily. Reported on 10/26/2015, Disp: , Rfl:  .  clonazePAM (KLONOPIN) 0.5 MG tablet, Take 0.5 mg by mouth 3 (three) times daily as needed. , Disp: , Rfl:  .  cyclobenzaprine (FLEXERIL) 10 MG tablet, Take 1 tablet (10 mg total) by mouth 3 (three) times daily as needed for muscle spasms., Disp: 540 tablet, Rfl: 0 .  DULoxetine (CYMBALTA) 60 MG capsule, Take 60 mg by mouth daily. Reported on 10/26/2015, Disp: , Rfl:  .  finasteride (PROSCAR) 5 MG tablet, Take 5 mg by mouth daily., Disp: , Rfl:  .  fluticasone (FLONASE) 50 MCG/ACT nasal spray, SHAKE LIQUID AND USE 2 SPRAYS IN EACH NOSTRIL  DAILY, Disp: , Rfl:  .  Fluticasone-Umeclidin-Vilant (TRELEGY ELLIPTA) 100-62.5-25 MCG/INH AEPB, Inhale 1 puff into the lungs daily., Disp: , Rfl:  .  furosemide (LASIX) 20 MG tablet, Take two once or twice daily as directed , patient taking 25m once daily, Disp: , Rfl:  .  gabapentin (NEURONTIN) 800 MG tablet, Take 1 tablet (800 mg total) by mouth every 8 (eight) hours., Disp: 90 tablet, Rfl: 5 .  guaiFENesin (MUCINEX) 600 MG 12 hr tablet, Take 600 mg by mouth every 12 (twelve) hours. Patient taking 400 mg 3 times a day, Disp: , Rfl:  .  ibuprofen (ADVIL,MOTRIN) 200 MG tablet, Take 400 mg by mouth., Disp: , Rfl:  .  ipratropium (ATROVENT HFA) 17 MCG/ACT inhaler, Inhale 2 puffs into the lungs every 6 (six) hours., Disp: , Rfl:  .  ipratropium-albuterol (DUONEB) 0.5-2.5 (3) MG/3ML SOLN, Take 3 mLs by nebulization every 4 (four) hours as needed., Disp: , Rfl:  .  losartan (COZAAR) 25 MG tablet, Take 25 mg by mouth daily. Take 1/2 tablet once daily, Disp: , Rfl:  .  meloxicam (MOBIC) 15 MG tablet, Take 1 tablet (15 mg total) by mouth daily., Disp: 180 tablet, Rfl: 0 .  metoprolol tartrate (LOPRESSOR) 25 MG tablet, Take 1 tablet (25 mg total)  by mouth 2 (two) times daily., Disp: 60 tablet, Rfl: 0 .  omeprazole (PRILOSEC) 40 MG capsule, Take 40 mg by mouth 2 (two) times daily., Disp: , Rfl:  .  polyethylene glycol (MIRALAX / GLYCOLAX) packet, Take 17 g by mouth daily., Disp: , Rfl:  .  predniSONE (DELTASONE) 10 MG tablet, Take 1 tablet by mouth daily. , Disp: , Rfl:  .  promethazine (PHENERGAN) 25 MG tablet, Take 25 mg by mouth every 6 (six) hours as needed. , Disp: , Rfl:  .  tamsulosin (FLOMAX) 0.4 MG CAPS capsule, Take 0.4 mg by mouth daily., Disp: , Rfl:  .  traMADol (ULTRAM) 50 MG tablet, Take 1 tablet (50 mg total) by mouth every 6 (six) hours as needed., Disp: 120 tablet, Rfl: 5 .  traZODone (DESYREL) 50 MG tablet, Take 150 mg by mouth at bedtime., Disp: , Rfl:  .  Vitamin D, Ergocalciferol,  (DRISDOL) 50000 units CAPS capsule, Take 1 capsule (50,000 Units total) by mouth 2 (two) times a week x 6 weeks., Disp: 12 capsule, Rfl: 0 .  Cyanocobalamin (VITAMELTS ENERGY VITAMIN B-12) 1500 MCG TBDP, Take 1 tablet by mouth daily. (Patient taking differently: Take 1 tablet by mouth daily. Taking 2000 mcg daily), Disp: 180 tablet, Rfl: 0 .  lidocaine (XYLOCAINE) 5 % ointment, Apply 1 application topically 4 (four) times daily as needed for moderate pain., Disp: 35.44 g, Rfl: 2  ROS  Constitutional: Denies any fever or chills Gastrointestinal: No reported hemesis, hematochezia, vomiting, or acute GI distress Musculoskeletal: Denies any acute onset joint swelling, redness, loss of ROM, or weakness Neurological: No reported episodes of acute onset apraxia, aphasia, dysarthria, agnosia, amnesia, paralysis, loss of coordination, or loss of consciousness  Allergies  Mr. Quast has No Known Allergies.  [place]  Drug: Mr. Abee  reports that he has current or past drug history. Alcohol:  reports that he does not drink alcohol. Tobacco:  reports that he quit smoking about 19 months ago. His smoking use included cigarettes. He smoked 0.00 packs per day for 40.00 years. He has quit using smokeless tobacco. Medical:  has a past medical history of Anxiety, Arthritis, Chronic back pain, Chronic neck pain, COPD (chronic obstructive pulmonary disease) ([place]), Depression, Hyperlipidemia, Hypertension, Opiate abuse, continuous ([place]), and Pneumonia. Surgical: Mr. Judson  has a past surgical history that includes Cholecystectomy; Spinal fusion; Neck surgery (2002 approx); Hernia repair; and Thoracotomy , removal of mesh from prevous hernia repair  (Left). Family: family history includes Bladder Cancer in his father.  Constitutional Exam  General appearance: Well nourished, well developed, and well hydrated. In no apparent acute distress Vitals:   01/25/18 1312  BP: 125/80  Pulse: 98  Temp: 97.6 F (36.4 C)   SpO2: 95%  Weight: 218 lb (98.9 kg)  Height: 6' (1.829 m)  Psych/Mental status: Alert, oriented x 3 (person, place, & time)       Eyes: PERLA Respiratory: No evidence of acute respiratory distress   Thoracic Spine Area Exam  Skin & Axial Inspection: Well healed scar from previous spine surgery detected Alignment: Asymmetric Functional ROM: Unrestricted ROM Stability: No instability detected Muscle Tone/Strength: Functionally intact. No obvious neuro-muscular anomalies detected. Sensory (Neurological): Unimpaired Muscle strength & Tone: Tender  Lumbar Spine Area Exam  Skin & Axial Inspection: No masses, redness, or swelling Alignment: Symmetrical Functional ROM: Unrestricted ROM       Stability: No instability detected Muscle Tone/Strength: Functionally intact. No obvious neuro-muscular anomalies detected. Sensory (Neurological): Unimpaired Palpation: Tender  Provocative Tests: Lumbar Hyperextension/rotation test: deferred today       Lumbar quadrant test (Kemp's test): deferred today       Lumbar Lateral bending test: deferred today       Patrick's Maneuver: deferred today                   FABER test: deferred today       Thigh-thrust test: deferred today       S-I compression test: deferred today       S-I distraction test: deferred today        Gait & Posture Assessment  Ambulation: Patient ambulates using a wheel chair Gait: Relatively normal for age and body habitus Posture: WNL   Lower Extremity Exam    Side: Right lower extremity  Side: Left lower extremity  Stability: No instability observed          Stability: No instability observed          Skin & Extremity Inspection: Skin color, temperature, and hair growth are WNL. No peripheral edema or cyanosis. No masses, redness, swelling, asymmetry, or associated skin lesions. No contractures.  Skin & Extremity Inspection: Skin color, temperature, and hair growth are WNL. No peripheral edema or cyanosis. No masses,  redness, swelling, asymmetry, or associated skin lesions. No contractures.  Functional ROM: Unrestricted ROM                  Functional ROM: Unrestricted ROM                  Muscle Tone/Strength: Functionally intact. No obvious neuro-muscular anomalies detected.  Muscle Tone/Strength: Functionally intact. No obvious neuro-muscular anomalies detected.  Sensory (Neurological): Unimpaired  Sensory (Neurological): Unimpaired  Palpation: No palpable anomalies  Palpation: No palpable anomalies   Assessment  Primary Diagnosis & Pertinent Problem List: The primary encounter diagnosis was Lumbar spondylosis. Diagnoses of Lumbar facet syndrome (Bilateral) (R>L), Post-thoracotomy pain syndrome (Left), Chronic pain syndrome, Osteoarthritis, and Musculoskeletal pain were also pertinent to this visit.  Status Diagnosis  Worsening Worsening Persistent 1. Lumbar spondylosis   2. Lumbar facet syndrome (Bilateral) (R>L)   3. Post-thoracotomy pain syndrome (Left)   4. Chronic pain syndrome   5. Osteoarthritis   6. Musculoskeletal pain     Problems updated and reviewed during this visit: Problem  Foreign Body in [place] of Care  Pharmacotherapy (Medications Ordered): Meds ordered this encounter  Medications  . traMADol (ULTRAM) 50 MG tablet    Sig: Take 1 tablet (50 mg total) by mouth every 6 (six) hours as needed.    Dispense:  120 tablet    Refill:  5    Do not place medication on "Automatic Refill". Fill one day early if pharmacy is closed on scheduled refill date. Do not refill any sooner than every 30 days.    Order Specific Question:   Supervising Provider    Answer:   Milinda Pointer [phone]  . cyclobenzaprine (FLEXERIL) 10 MG tablet    Sig: Take 1 tablet (10 mg total) by mouth 3 (three) times daily as needed for muscle spasms.    Dispense:  540 tablet    Refill:  0    Do not place medication on "Automatic Refill". Fill one day early if pharmacy is closed on  scheduled refill date.    Order Specific Question:   Supervising Provider    Answer:   Milinda Pointer [phone]  . meloxicam (MOBIC) 15 MG tablet  Sig: Take 1 tablet (15 mg total) by mouth daily.    Dispense:  180 tablet    Refill:  0    Do not add this medication to the electronic "Automatic Refill" notification system. Patient may have prescription filled one day early if pharmacy is closed on scheduled refill date.    Order Specific Question:   Supervising Provider    Answer:   Milinda Pointer [phone]  . gabapentin (NEURONTIN) 800 MG tablet    Sig: Take 1 tablet (800 mg total) by mouth every 8 (eight) hours.    Dispense:  90 tablet    Refill:  5    Order Specific Question:   Supervising Provider    Answer:   Milinda Pointer [phone]  . lidocaine (XYLOCAINE) 5 % ointment    Sig: Apply 1 application topically 4 (four) times daily as needed for moderate pain.    Dispense:  35.44 g    Refill:  2    Maximum dose: 5 g/application (approximately 6 inches of ointment); 20 g/day    Order Specific Question:   Supervising Provider    Answer:   Milinda Pointer [phone]   New Prescriptions   LIDOCAINE (XYLOCAINE) 5 % OINTMENT    Apply 1 application topically 4 (four) times daily as needed for moderate pain.   Medications administered today: Tradarius C. Sheppard Coil. had no medications administered during this visit. Lab-work, procedure(s), and/or referral(s): Orders Placed This Encounter  Procedures  . Radiofrequency,Lumbar   Imaging and/or referral(s): None  Interventional therapies: Planned, scheduled, and/or pending: Left Lumbar facet RFA #2   Considering:  Diagnostic Left T11 & T12 intercostalnerve block Possible left T11 and T12 intercostal RFA Diagnostic bilateral lumbar facet block + bilateral S-I Block#2 Possible bilateral lumbar facet block + bilateral S-I RFA Diagnostic right-sided lumbar epidural steroid injection  Diagnostic bilateral cervical  facetblock  Diagnostic right-sided cervical epiduralsteroid injection  Diagnostic bilateral intra-articular shoulderjoint injections  Diagnostic bilateral suprascapular nerveblocks    Palliative PRN treatment(s):  Palliative bilateral lumbar facet block#2+ bilateral S-Iblock#2.      Provider-requested follow-up: Return in about 3 months (around 04/27/2018) for MedMgmt with Me Dionisio David), in addition, w/ Dr. Dossie Arbour, Procedure(w/Sedation), Left Lumbar RFA.  Future Appointments  Date Time Provider [place]  01/29/2018  1:45 PM Alfonzo Feller, RN [facility] None  04/27/2018  1:30 PM Vevelyn Francois, NP [facility] None   Primary Care Physician: Marygrace Drought, MD Location: [facility] Outpatient Pain Management Facility Note by: Vevelyn Francois NP Date: 01/25/2018; Time: 4:08 PM  Pain Score Disclaimer: We use the NRS-11 scale. This is a self-reported, subjective measurement of pain severity with only modest accuracy. It is used primarily to identify changes within a particular patient. It must be understood that outpatient pain scales are significantly less accurate that those used for research, where they can be applied under ideal controlled circumstances with minimal exposure to variables. In reality, the score is likely to be a combination of pain intensity and pain affect, where pain affect describes the degree of emotional arousal or changes in action readiness caused by the sensory experience of pain. Factors such as social and work situation, setting, emotional state, anxiety levels, expectation, and prior pain experience may influence pain perception and show large inter-individual differences that may also be affected by time variables.  Patient instructions provided during this appointment: Patient Instructions  ____________________________________________________________________________________________  Medication Rules  Applies to: All patients receiving  prescriptions (written or electronic).  Pharmacy of record:  Pharmacy where electronic prescriptions will be sent. If written prescriptions are taken to a different pharmacy, please inform the nursing staff. The pharmacy listed in the electronic medical record should be the one where you would like electronic prescriptions to be sent.  Prescription refills: Only during scheduled appointments. Applies to both, written and electronic prescriptions.  NOTE: The following applies primarily to controlled substances (Opioid* Pain Medications).   Patient's responsibilities: 1. Pain Pills: Bring all pain pills to every appointment (except for procedure appointments). 2. Pill Bottles: Bring pills in original pharmacy bottle. Always bring newest bottle. Bring bottle, even if empty. 3. Medication refills: You are responsible for knowing and keeping track of what medications you need refilled. The day before your appointment, write a list of all prescriptions that need to be refilled. Bring that list to your appointment and give it to the admitting nurse. Prescriptions will be written only during appointments. If you forget a medication, it will not be "Called in", "Faxed", or "electronically sent". You will need to get another appointment to get these prescribed. 4. Prescription Accuracy: You are responsible for carefully inspecting your prescriptions before leaving our office. Have the discharge nurse carefully go over each prescription with you, before taking them home. Make sure that your name is accurately spelled, that your address is correct. Check the name and dose of your medication to make sure it is accurate. Check the number of pills, and the written instructions to make sure they are clear and accurate. Make sure that you are given enough medication to last until your next medication refill appointment. 5. Taking Medication: Take medication as prescribed. Never take more pills than instructed. Never take  medication more frequently than prescribed. Taking less pills or less frequently is permitted and encouraged, when it comes to controlled substances (written prescriptions).  6. Inform other Doctors: Always inform, all of your healthcare providers, of all the medications you take. 7. Pain Medication from other Providers: You are not allowed to accept any additional pain medication from any other Doctor or Healthcare provider. There are two exceptions to this rule. (see below) In the event that you require additional pain medication, you are responsible for notifying us, as stated below. 8. Medication Agreement: You are responsible for carefully reading and following our Medication Agreement. This must be signed before receiving any prescriptions from our practice. Safely store a copy of your signed Agreement. Violations to the Agreement will result in no further prescriptions. (Additional copies of our Medication Agreement are available upon request.) 9. Laws, Rules, & Regulations: All patients are expected to follow all Federal and [employer], [employer], Rules, [employer]. Ignorance of the Laws does not constitute a valid excuse. The use of any illegal substances is prohibited. 10. Adopted CDC guidelines & recommendations: Target dosing levels will be at or below 60 MME/day. Use of benzodiazepines** is not recommended.  Exceptions: There are only two exceptions to the rule of not receiving pain medications from other Healthcare Providers. 1. Exception #1 (Emergencies): In the event of an emergency (i.e.: accident requiring emergency care), you are allowed to receive additional pain medication. However, you are responsible for: As soon as you are able, call our office [phone], at any time of the day or night, and leave a message stating your name, the date and nature of the emergency, and the name and dose of the medication prescribed. In the event that your call is answered by a member of our  staff, make sure to  document and save the date, time, and the name of the person that took your information.  2. Exception #2 (Planned Surgery): In the event that you are scheduled by another doctor or dentist to have any type of surgery or procedure, you are allowed (for a period no longer than 30 days), to receive additional pain medication, for the acute post-op pain. However, in this case, you are responsible for picking up a copy of our "Post-op Pain Management for Surgeons" handout, and giving it to your surgeon or dentist. This document is available at our office, and does not require an appointment to obtain it. Simply go to our office during business hours (Monday-Thursday from 8:00 AM to 4:00 PM) (Friday 8:00 AM to 12:00 Noon) or if you have a scheduled appointment with [place], prior to your surgery, and ask for it by name. In addition, you will need to provide [place] with your name, name of your surgeon, type of surgery, and date of procedure or surgery.  *Opioid medications include: morphine, codeine, oxycodone, oxymorphone, hydrocodone, hydromorphone, meperidine, tramadol, tapentadol, buprenorphine, fentanyl, methadone. **Benzodiazepine medications include: diazepam (Valium), alprazolam (Xanax), clonazepam (Klonopine), lorazepam (Ativan), clorazepate (Tranxene), chlordiazepoxide (Librium), estazolam (Prosom), oxazepam (Serax), temazepam (Restoril), triazolam (Halcion) (Last updated: 09/24/2017) ____________________________________________________________________________________________   ____________________________________________________________________________________________  Preparing for Procedure with Sedation  Instructions: . Oral Intake: Do not eat or drink anything for at least 8 hours prior to your procedure. . Transportation: Public transportation is not allowed. Bring an adult driver. The driver must be physically present in our waiting room before any procedure can be  started. Marland Kitchen Physical Assistance: Bring an adult physically capable of assisting you, in the event you need help. This adult should keep you company at home for at least 6 hours after the procedure. . Blood Pressure Medicine: Take your blood pressure medicine with a sip of water the morning of the procedure. . Blood thinners:  . Diabetics on insulin: Notify the staff so that you can be scheduled 1st case in the morning. If your diabetes requires high dose insulin, take only  of your normal insulin dose the morning of the procedure and notify the staff that you have done so. . Preventing infections: Shower with an antibacterial soap the morning of your procedure. . Build-up your immune system: Take 1000 mg of Vitamin C with every meal (3 times a day) the day prior to your procedure. Marland Kitchen Antibiotics: Inform the staff if you have a condition or reason that requires you to take antibiotics before dental procedures. . Pregnancy: If you are pregnant, call and cancel the procedure. . Sickness: If you have a cold, fever, or any active infections, call and cancel the procedure. . Arrival: You must be in the facility at least 30 minutes prior to your scheduled procedure. . Children: Do not bring children with you. . Dress appropriately: Bring dark clothing that you would not mind if they get stained. . Valuables: Do not bring any jewelry or valuables.  Procedure appointments are reserved for interventional treatments only. Marland Kitchen No Prescription Refills. . No medication changes will be discussed during procedure appointments. . No disability issues will be discussed.  Remember:  Regular Business hours are:  Monday to Thursday 8:00 AM to 4:00 PM  Provider's Schedule: Milinda Pointer, MD:  Procedure days: Tuesday and Thursday 7:30 AM to 4:00 PM  Gillis Santa, MD:  Procedure days: Monday and Wednesday 7:30 AM to 4:00  PM ____________________________________________________________________________________________  Radiofrequency Lesioning Radiofrequency lesioning is a procedure that is performed  to relieve pain. The procedure is often used for back, neck, or arm pain. Radiofrequency lesioning involves the use of a machine that creates radio waves to make heat. During the procedure, the heat is applied to the nerve that carries the pain signal. The heat damages the nerve and interferes with the pain signal. Pain relief usually starts about 2 weeks after the procedure and lasts for 6 months to 1 year. Tell a health care provider about:  Any allergies you have.  All medicines you are taking, including vitamins, herbs, eye drops, creams, and over-the-counter medicines.  Any problems you or family members have had with anesthetic medicines.  Any blood disorders you have.  Any surgeries you have had.  Any medical conditions you have.  Whether you are pregnant or may be pregnant. What are the risks? Generally, this is a safe procedure. However, problems may occur, including:  Pain or soreness at the injection site.  Infection at the injection site.  Damage to nerves or blood vessels.  What happens before the procedure?  Ask your health care provider about: ? Changing or stopping your regular medicines. This is especially important if you are taking diabetes medicines or blood thinners. ? Taking medicines such as aspirin and ibuprofen. These medicines can thin your blood. Do not take these medicines before your procedure if your health care provider instructs you not to.  Follow instructions from your health care provider about eating or drinking restrictions.  Plan to have someone take you home after the procedure.  If you go home right after the procedure, plan to have someone with you for 24 hours. What happens during the procedure?  You will be given one or more of the following: ? A medicine  to help you relax (sedative). ? A medicine to numb the area (local anesthetic).  You will be awake during the procedure. You will need to be able to talk with the health care provider during the procedure.  With the help of a type of X-ray (fluoroscopy), the health care provider will insert a radiofrequency needle into the area to be treated.  Next, a wire that carries the radio waves (electrode) will be put through the radiofrequency needle. An electrical pulse will be sent through the electrode to verify the correct nerve. You will feel a tingling sensation, and you may have muscle twitching.  Then, the tissue that is around the needle tip will be heated by an electric current that is passed using the radiofrequency machine. This will numb the nerves.  A bandage (dressing) will be put on the insertion area after the procedure is done. The procedure may vary among health care providers and hospitals. What happens after the procedure?  Your blood pressure, heart rate, breathing rate, and blood oxygen level will be monitored often until the medicines you were given have worn off.  Return to your normal activities as directed by your health care provider. This information is not intended to replace advice given to you by your health care provider. Make sure you discuss any questions you have with your health care provider. Document Released: 03/12/2011 Document Revised: 12/20/2015 Document Reviewed: 08/21/2014 Elsevier Interactive Patient Education  [employer].

## 2018-01-27 ENCOUNTER — Other Ambulatory Visit: Payer: Self-pay | Admitting: Nurse Practitioner

## 2018-01-27 ENCOUNTER — Telehealth: Payer: Self-pay | Admitting: Nurse Practitioner

## 2018-01-27 DIAGNOSIS — G8929 Other chronic pain: Secondary | ICD-10-CM | POA: Diagnosis not present

## 2018-01-27 DIAGNOSIS — R251 Tremor, unspecified: Secondary | ICD-10-CM | POA: Diagnosis not present

## 2018-01-27 DIAGNOSIS — I10 Essential (primary) hypertension: Secondary | ICD-10-CM | POA: Diagnosis not present

## 2018-01-27 DIAGNOSIS — J432 Centrilobular emphysema: Secondary | ICD-10-CM | POA: Diagnosis not present

## 2018-01-27 MED ORDER — LUBIPROSTONE 24 MCG PO CAPS: 24.0000 ug | ORAL_CAPSULE | Freq: Two times a day (BID) | ORAL | 0 refills | Status: AC

## 2018-01-27 NOTE — Telephone Encounter (Signed)
Called pt to inform that it was sent to the pharm.

## 2018-01-27 NOTE — Telephone Encounter (Signed)
Amitza sent this is new.  Lidocaine was already sent.

## 2018-01-27 NOTE — Telephone Encounter (Signed)
Crystal, Could you escribe these and let me know So I can call patient.  Thanks.

## 2018-01-27 NOTE — Telephone Encounter (Signed)
Forgot 2 of patients meds at appt yesterday Lidocain ointement 5% And something for constipation Please call wife.  [phone]

## 2018-01-29 ENCOUNTER — Other Ambulatory Visit: Payer: Self-pay | Admitting: *Deleted

## 2018-01-29 NOTE — Patient Outreach (Signed)
[place] [facility]) Care Management  01/29/2018  Stanley Rios. 1956-03-18 [id number]   Telephone follow up call   Referral received on 09/17/17 Referral source : HTA Referral reason : DC from [facility] on [facility]/19, S/P surgeryleft thoracotomy and removal of mesh  62 year old male with PMHthat includes but limited toCOPD, Chronic pain syndrome, lung lesion , hypertension , anxiety. Heart failure EF 45%, lower extremity edema  Successful outreach call to patient home, able to speak his wife Stanley Rios, HIPAA verified.   [name] discussed recent visit to PCP, Dr. Guadelupe Rios, she discussed patient has decreased swelling in his legs, his weight is down to 220,she reports they still need to work on diet limiting processed foods , adhering to daily salt restrictions which they are working on she is reading labels trying to make sure patient is staying within his limit. Wife reports patient enjoys eating more junk food, cupcakes instead of regular meals and believes this contributes to his weight gain.  Wife discussed concern regarding discomfort at hernia site on left, and no further surgery to be done, to follow up pain clinic. Wife reports she discussed hospice with PCP and states is not at that point yet. . Patient missed his [facility]iatry appointment at [facility] clinic  and PCP plans to refer him to another office. Wife has also noted patient with tremors of hands and he will be referred to neurology.  Wife discussed social concerns of needing help with patient taking a shower, he has a shower bench states she is able to help, he just felt more comfortable when he had the bath aide coming, wife states she understands insurance will not cover just a bath aide coming out but she question if there are other resources in community available.  Wife discussed she is always looks at ways to cut back on expenses in the home, reports they already received extra help with medication cost, and  recently they have been approved through charity care at [facility] that hospital bill has been paid.   COPD Patient continues with decreased tolerance to activity, easily fatigues with mobility and gets anxious. Wife continues to encourage measures sure as purse lip breathing they have learned during pulmonary rehab. Patient continues to use Trelogy as prescribed daily and using less albuterol inhaler. Patient continues with oxygen at 3 liters at rest and 4 when walking to car.   Chronic Pan Patient with chronic back pain, and pain at left hernia site, they are hoping for approval of another nerve block procedure to help with pain . He continues to follow up with office as recommended.   Assessment : Patient will continue to benefit from ongoing education and support of chronic conditions.    Plan  Will plan home visit in this month, to identity other care coordination care needs prior to transition to health coach as plan previously discussed.    [name], RN, [place] Management Coordinator  [phone]- Mobile [phone]- Toll Free Main Office

## 2018-02-15 ENCOUNTER — Other Ambulatory Visit: Payer: Self-pay | Admitting: *Deleted

## 2018-02-15 ENCOUNTER — Ambulatory Visit: Payer: Self-pay | Admitting: *Deleted

## 2018-02-15 DIAGNOSIS — B351 Tinea unguium: Secondary | ICD-10-CM | POA: Diagnosis not present

## 2018-02-15 DIAGNOSIS — M79672 Pain in left foot: Secondary | ICD-10-CM | POA: Diagnosis not present

## 2018-02-15 DIAGNOSIS — M79671 Pain in right foot: Secondary | ICD-10-CM | POA: Diagnosis not present

## 2018-02-15 NOTE — Patient Outreach (Addendum)
[place] [facility]) Care Management  02/15/2018  Stanley Rios. 1955/12/10 [id number]      Received voice mail message from patient wife, requesting cancelling home visit for today.  Returned call to patent wife, HIPAA verified, very brief conversation, due to caregiver handling calls related to death of her brother. She request rescheduling home visit in a few weeks.  She discussed [name] is doing pretty good, no new concerns . Unable to address all care plan goals at this visit.   Plan Will plan home visit in the next month. Reinforced to notify if new concerns arise sooner.     [name], RN, [place] Management Coordinator  [phone]- Mobile [phone]- Toll Free Main Office

## 2018-03-04 ENCOUNTER — Encounter: Payer: Self-pay | Admitting: *Deleted

## 2018-03-04 ENCOUNTER — Other Ambulatory Visit: Payer: Self-pay | Admitting: *Deleted

## 2018-03-04 NOTE — Patient Outreach (Addendum)
[name]) Care Management   03/04/2018  [name]. [birth year] [id number]  Stanley Rios. is an 62 y.o. male  Subjective:  Patient reports that he is feeling today, still has some days with low energy and more winded with walking . Patient discussed that he is using less of albuterol inhaler since he began taking Trelegy. Patient discussed his feet feel so much better since getting toe nail clipped at podiatrist in the last 2 weeks.  Patient reports that limits his time outside due to the humidity , but he looking forward to fall so that he can go outside more.  Patient discussed that he increases his oxygen to from 2 liters at rest to  5 liters when walking to car, he usually drives his scooter down the ramp and then transfers to car.  Patient discussed his chronic back pain limits his walking , hopeful pain doctor will be able do something further so that he can be more active. Patient reports that he still has some swelling in his legs, right is greater than left, but it has not increased.   Patient discussed new referral visit to neurology regarding tremors that he has in his hands.   Objective:  BP 110/64 (BP Location: Left Arm, Patient Position: Sitting, Cuff Size: Large)   Pulse 99   Resp 20   Ht 1.803 m (5' 11" )   Wt 222 lb 12.8 oz (101.1 kg)   SpO2 97%   BMI 31.07 kg/m  Review of Systems  Constitutional: Negative.   HENT: Negative.   Eyes: Negative.   Cardiovascular: Negative.   Genitourinary: Negative.   Musculoskeletal: Positive for back pain.  Skin: Negative.   Neurological: Positive for tremors.       Hand tremors   Endo/Heme/Allergies: Negative.   Psychiatric/Behavioral: Negative.     Physical Exam  Constitutional: He appears well-developed and well-nourished.  Cardiovascular: Normal rate and normal heart sounds.  Respiratory: Effort normal and breath sounds normal.  GI: Soft. Bowel sounds are normal.    Skin: Skin is warm  and dry.  Psychiatric: He has a normal mood and affect. His behavior is normal. Judgment and thought content normal.    Encounter Medications:   Outpatient Encounter Medications as of 03/04/2018  Medication Sig Note  . acetaminophen (TYLENOL) 500 MG tablet Take 1,000 mg by mouth.   Marland Kitchen albuterol (PROVENTIL HFA;VENTOLIN HFA) 108 (90 Base) MCG/ACT inhaler Inhale 2 puffs into the lungs every 4 (four) hours as needed for wheezing or shortness of breath. Reported on 10/26/2015   . buPROPion (WELLBUTRIN) 75 MG tablet Take 75 mg by mouth 2 (two) times daily. Reported on 10/26/2015   . clonazePAM (KLONOPIN) 0.5 MG tablet Take 0.5 mg by mouth 3 (three) times daily as needed.    . Cyanocobalamin (VITAMELTS ENERGY VITAMIN B-12) 1500 MCG TBDP Take 1 tablet by mouth daily. (Patient taking differently: Take 1 tablet by mouth daily. Taking 2000 mcg daily)   . cyclobenzaprine (FLEXERIL) 10 MG tablet Take 1 tablet (10 mg total) by mouth 3 (three) times daily as needed for muscle spasms.   . DULoxetine (CYMBALTA) 60 MG capsule Take 60 mg by mouth daily. Reported on 10/26/2015   . finasteride (PROSCAR) 5 MG tablet Take 5 mg by mouth daily.   . fluticasone (FLONASE) 50 MCG/ACT nasal spray SHAKE LIQUID AND USE 2 SPRAYS IN EACH NOSTRIL DAILY   . Fluticasone-Umeclidin-Vilant (TRELEGY ELLIPTA) 100-62.5-25 MCG/INH AEPB Inhale 1 puff into the lungs  daily.   . furosemide (LASIX) 20 MG tablet Take two once or twice daily as directed , patient taking 17m once daily   . gabapentin (NEURONTIN) 800 MG tablet Take 1 tablet (800 mg total) by mouth every 8 (eight) hours.   .Marland KitchenguaiFENesin (MUCINEX) 600 MG 12 hr tablet Take 600 mg by mouth every 12 (twelve) hours. Patient taking 400 mg 3 times a day   . ibuprofen (ADVIL,MOTRIN) 200 MG tablet Take 400 mg by mouth.   .Marland Kitchenipratropium-albuterol (DUONEB) 0.5-2.5 (3) MG/3ML SOLN Take 3 mLs by nebulization every 4 (four) hours as needed.   .Marland Kitchenlosartan (COZAAR) 25 MG tablet Take 25 mg by mouth  daily. Take 1/2 tablet once daily   . meloxicam (MOBIC) 15 MG tablet Take 1 tablet (15 mg total) by mouth daily.   . metoprolol tartrate (LOPRESSOR) 25 MG tablet Take 1 tablet (25 mg total) by mouth 2 (two) times daily.   .Marland Kitchenomeprazole (PRILOSEC) 40 MG capsule Take 40 mg by mouth 2 (two) times daily.   . polyethylene glycol (MIRALAX / GLYCOLAX) packet Take 17 g by mouth daily.   . predniSONE (DELTASONE) 10 MG tablet Take 1 tablet by mouth daily.    . promethazine (PHENERGAN) 25 MG tablet Take 25 mg by mouth every 6 (six) hours as needed.  09/23/2017: Has on hand if needed  . tamsulosin (FLOMAX) 0.4 MG CAPS capsule Take 0.4 mg by mouth daily.   . traMADol (ULTRAM) 50 MG tablet Take 1 tablet (50 mg total) by mouth every 6 (six) hours as needed.   . traZODone (DESYREL) 50 MG tablet Take 150 mg by mouth at bedtime.   .Marland Kitchenipratropium (ATROVENT HFA) 17 MCG/ACT inhaler Inhale 2 puffs into the lungs every 6 (six) hours.   . lidocaine (XYLOCAINE) 5 % ointment Apply 1 application topically 4 (four) times daily as needed for moderate pain. (Patient not taking: Reported on 03/04/2018)   . Vitamin D, Ergocalciferol, (DRISDOL) 50000 units CAPS capsule Take 1 capsule (50,000 Units total) by mouth 2 (two) times a week x 6 weeks. (Patient not taking: Reported on 03/04/2018)    No facility-administered encounter medications on file as of 03/04/2018.     Functional Status:   In your present state of health, do you have any difficulty performing the following activities: 09/17/2017  Hearing? N  Vision? N  Difficulty concentrating or making decisions? Y  Walking or climbing stairs? Y  Comment using walker, forgetful at time   Dressing or bathing? Y  Comment wife assist   Doing errands, shopping? Y  Comment wife/familly  assist with tTherapist, occupationaland eating ? N  Comment wife prepares  Using the Toilet? N  In the past six months, have you accidently leaked urine? N  Do you have problems with loss of  bowel control? N  Managing your Medications? Y  Comment wife helps   Managing your Finances? Y  Comment wife assist   Housekeeping or managing your Housekeeping? N  Comment wife does house keeping   Some recent data might be hidden    Fall/Depression Screening:    Fall Risk  01/25/2018 09/23/2017 08/12/2017  Falls in the past year? No No No  Risk for fall due to : - Impaired balance/gait;Impaired mobility -  Risk for fall due to: Comment - - -   PHQ 2/9 Scores 01/25/2018 09/23/2017 08/12/2017 07/30/2017 06/09/2017 05/13/2017 03/04/2017  PHQ - 2 Score 2 2 0 0 0 4 3  PHQ- 9 Score 3 4 - - - 12 10    Assessment:  Routine home visit   COPD- verbalized being in the green zone,  Heart Failure - today weight 222.8, and daily weights remain in the range of 220- 222. No recent sudden weight gain, or swelling identified being in the green zone today. Continue to monitor blood pressure with reading at home 110-130/60-80 , denies dizziness.  Social - wife with concerns regarding living patient at home alone while she is out at times , provided contact number for [employer] care.   [facility] care management disease management telephonic program , patient is agreeable.  Plan Will plan follow up call in the next 2 weeks and plan transition to disease management.  Viewed [facility] website with wife discussed resources available .  Will send PCP visit note .     THN CM Care Plan Problem One     Most Recent Value  Care Plan Problem One  Recent diagnosis of Heart failure with EF 40 %  Role Documenting the Problem One  Care Management Coordinator  Care Plan for Problem One  Active  THN Long Term Goal   Patient will report increased knowledge of Heart failure self care management measues in the next 90 days   THN Long Term Goal Start Date  12/15/17 [goal extended , unable to address all goals at visit due to ]  Interventions for Problem One Long Term Goal  Home visit, clinical symptom review,encouraged  continued taking medications as prescribed, keeping all MD visit, discussing know where to go, notifying MD sooner of symptom to arrange office visit.   THN CM Short Term Goal #1   Patient will weigh daily and keep a record in the next 30 days   THN CM Short Term Goal #1 Start Date  12/15/17  [facility] CM Short Term Goal #1 Met Date  01/21/18  THN CM Short Term Goal #2   Patient/wife will be able to report at least 3 symptoms of worsening heart symptoms and aciton plan in the next 30 days   THN CM Short Term Goal #2 Start Date  12/16/17  [facility] CM Short Term Goal #2 Met Date  01/21/18  THN CM Short Term Goal #3  Patient/wife will be able to report action to take for Heart failure  worsening symptoms in the next 25 days   THN CM Short Term Goal #3 Start Date  01/21/18  [facility] CM Short Term Goal #3 Met Date  03/04/18  Interventions for Short Tern Goal #3  late entry for [place], RN, [place] Management Coordinator  [phone]- Mobile [phone]- [place]

## 2018-03-09 ENCOUNTER — Telehealth: Payer: Self-pay | Admitting: Pain Medicine

## 2018-03-09 NOTE — Telephone Encounter (Signed)
Ok, thank you. Not sure why it wasn't scheduled back in July then

## 2018-03-09 NOTE — Telephone Encounter (Signed)
Patient's wife called to check on RFA that was put in by C. Edison Pace on 01-25-18. They would like to have done as quick as possible. Please check for Prior Auth and schedule

## 2018-03-09 NOTE — Telephone Encounter (Signed)
Stanley Rios, This patient has HTA. It does not require prior auth. 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 The only thing HTA needs prior auth for is SCS trials, pumps etc.

## 2018-03-17 ENCOUNTER — Other Ambulatory Visit: Payer: Self-pay | Admitting: *Deleted

## 2018-03-17 NOTE — Patient Outreach (Signed)
[facility]) Care Management  [place]  03/17/2018   Stanley Rios. Dec 05, 1955 [id number]     Telephone follow up call Transition to Disease Management   62 year old male with recent surgery at [place] left thoracotomy and removal of mesh   PMHx : significant for but not limited to   COPD,HTN, GERD, Heart failure, chronic pain , Left lung hernia, s/p repair with gortex mesh 10/17, HTN, Anxiety, spinal stenosis, hyperlipidemia ,Right lung nodule s/p radiation therapy   Subjective:  Successful outreach call to patient wife, Stanley Rios, confirmed HIPAA x 2 identifiers.   Wife reports patient is doing fairly well. She reports patient continues tolerating mobility in home gets short of breath easily she has to remind him to turn oxygen up from 2 liters to 4 liters with activity. She discussing encouraging patient with activity in home and using stationary hand cycle. Patient uses his scooter down ramp to car.she reminds him of breathing exercises.   Wife discussed :  Heart failure  Continues to monitor weights and patient is staying in the 220 range without sudden weight increase of 3 pounds in a day or 5 in a week. She reports that swellings in legs is about the same and patient continues to take medication as prescribed.   Chronic Pain Patient continues to follow up with pain clinic and hoping for a procedure to help with back pain and left flank area pain.    Encounter Medications:  Outpatient Encounter Medications as of 03/17/2018  Medication Sig Note  . acetaminophen (TYLENOL) 500 MG tablet Take 1,000 mg by mouth.   Marland Kitchen albuterol (PROVENTIL HFA;VENTOLIN HFA) 108 (90 Base) MCG/ACT inhaler Inhale 2 puffs into the lungs every 4 (four) hours as needed for wheezing or shortness of breath. Reported on 10/26/2015   . buPROPion (WELLBUTRIN) 75 MG tablet Take 75 mg by mouth 2 (two) times daily. Reported on 10/26/2015   . clonazePAM (KLONOPIN) 0.5 MG  tablet Take 0.5 mg by mouth 3 (three) times daily as needed.    . Cyanocobalamin (VITAMELTS ENERGY VITAMIN B-12) 1500 MCG TBDP Take 1 tablet by mouth daily. (Patient taking differently: Take 1 tablet by mouth daily. Taking 2000 mcg daily)   . cyclobenzaprine (FLEXERIL) 10 MG tablet Take 1 tablet (10 mg total) by mouth 3 (three) times daily as needed for muscle spasms.   . DULoxetine (CYMBALTA) 60 MG capsule Take 60 mg by mouth daily. Reported on 10/26/2015   . finasteride (PROSCAR) 5 MG tablet Take 5 mg by mouth daily.   . fluticasone (FLONASE) 50 MCG/ACT nasal spray SHAKE LIQUID AND USE 2 SPRAYS IN EACH NOSTRIL DAILY   . Fluticasone-Umeclidin-Vilant (TRELEGY ELLIPTA) 100-62.5-25 MCG/INH AEPB Inhale 1 puff into the lungs daily.   . furosemide (LASIX) 20 MG tablet Take two once or twice daily as directed , patient taking 80mg  once daily   . gabapentin (NEURONTIN) 800 MG tablet Take 1 tablet (800 mg total) by mouth every 8 (eight) hours.   Marland Kitchen guaiFENesin (MUCINEX) 600 MG 12 hr tablet Take 600 mg by mouth every 12 (twelve) hours. Patient taking 400 mg 3 times a day   . ibuprofen (ADVIL,MOTRIN) 200 MG tablet Take 400 mg by mouth.   Marland Kitchen ipratropium (ATROVENT HFA) 17 MCG/ACT inhaler Inhale 2 puffs into the lungs every 6 (six) hours.   Marland Kitchen ipratropium-albuterol (DUONEB) 0.5-2.5 (3) MG/3ML SOLN Take 3 mLs by nebulization every 4 (four) hours as needed.   . lidocaine (  XYLOCAINE) 5 % ointment Apply 1 application topically 4 (four) times daily as needed for moderate pain. (Patient not taking: Reported on 03/04/2018)   . losartan (COZAAR) 25 MG tablet Take 25 mg by mouth daily. Take 1/2 tablet once daily   . meloxicam (MOBIC) 15 MG tablet Take 1 tablet (15 mg total) by mouth daily.   . metoprolol tartrate (LOPRESSOR) 25 MG tablet Take 1 tablet (25 mg total) by mouth 2 (two) times daily.   Marland Kitchen omeprazole (PRILOSEC) 40 MG capsule Take 40 mg by mouth 2 (two) times daily.   . polyethylene glycol (MIRALAX / GLYCOLAX) packet  Take 17 g by mouth daily.   . predniSONE (DELTASONE) 10 MG tablet Take 1 tablet by mouth daily.    . promethazine (PHENERGAN) 25 MG tablet Take 25 mg by mouth every 6 (six) hours as needed.  09/23/2017: Has on hand if needed  . tamsulosin (FLOMAX) 0.4 MG CAPS capsule Take 0.4 mg by mouth daily.   . traMADol (ULTRAM) 50 MG tablet Take 1 tablet (50 mg total) by mouth every 6 (six) hours as needed.   . traZODone (DESYREL) 50 MG tablet Take 150 mg by mouth at bedtime.   . Vitamin D, Ergocalciferol, (DRISDOL) 50000 units CAPS capsule Take 1 capsule (50,000 Units total) by mouth 2 (two) times a week x 6 weeks. (Patient not taking: Reported on 03/04/2018)    No facility-administered encounter medications on file as of 03/17/2018.     Functional Status:  In your present state of health, do you have any difficulty performing the following activities: 09/17/2017  Hearing? N  Vision? N  Difficulty concentrating or making decisions? Y  Walking or climbing stairs? Y  Comment using walker, forgetful at time   Dressing or bathing? Y  Comment wife assist   Doing errands, shopping? Y  Comment wife/familly  assist with Therapist, occupational and eating ? N  Comment wife prepares  Using the Toilet? N  In the past six months, have you accidently leaked urine? N  Do you have problems with loss of bowel control? N  Managing your Medications? Y  Comment wife helps   Managing your Finances? Y  Comment wife assist   Housekeeping or managing your Housekeeping? N  Comment wife does house keeping   Some recent data might be hidden    Fall/Depression Screening: Fall Risk  01/25/2018 09/23/2017 08/12/2017  Falls in the past year? No No No  Risk for fall due to : - Impaired balance/gait;Impaired mobility -  Risk for fall due to: Comment - - -   PHQ 2/9 Scores 01/25/2018 09/23/2017 08/12/2017 07/30/2017 06/09/2017 05/13/2017 03/04/2017  PHQ - 2 Score 2 2 0 0 0 4 3  PHQ- 9 Score 3 4 - - - 12 10    Assessment:   In discussion patient and wife agreeable to transition to [place] coach program, to continue with management of chronic conditions of COPD and Heart failure .   Patient has upcoming appointments  Dr. Guadelupe Sabin, PCP on 10/3  Dr.Cavender ,cardiology on 03/31/18 Dr.Drummond, Pulmonary     Plan:  Will transition patient to Disease management program ,for continued education and support of  chronic conditions of COPD and Heart failure.  Will send PCP discipline closure letter.    [name], RN, [place] Management Coordinator  [phone]- Mobile [phone]- Toll Free Main Office

## 2018-03-23 ENCOUNTER — Ambulatory Visit: Payer: PPO | Admitting: Pain Medicine

## 2018-03-25 ENCOUNTER — Encounter: Payer: Self-pay | Admitting: *Deleted

## 2018-03-25 DIAGNOSIS — R251 Tremor, unspecified: Secondary | ICD-10-CM | POA: Diagnosis not present

## 2018-04-01 DIAGNOSIS — R251 Tremor, unspecified: Secondary | ICD-10-CM | POA: Insufficient documentation

## 2018-04-05 DIAGNOSIS — I10 Essential (primary) hypertension: Secondary | ICD-10-CM | POA: Diagnosis not present

## 2018-04-07 ENCOUNTER — Other Ambulatory Visit: Payer: Self-pay | Admitting: *Deleted

## 2018-04-07 NOTE — Patient Outreach (Signed)
Stanley Rios  04/07/2018  Stanley Rios. June 16, 1956 [id number]   Stanley Rios Health Coach telephone call to patient.  Hipaa compliance verified. Stanley Rios spoke with patient wife. Per wife the patient is in so much pain and doesn't want to move around . Wife stated that the patient weight is up 231. Patient went to cardiologist on [id number] and Cardiology is aware.  Per wife the patient stated he does not feel like moving due to the pain. Per wife he goes to the pain clinic next month and he is suppose to have the nerve in his back blocked next month. Patient stated she wants a DNR sheet for the refrigerator for the EMT if they have to come. Stanley Rios asked patient if  she had spoken with the Dr. Per patient she had.  Per wife they could not afford Palliative care cost. Stanley Rios asked about if she had discussed hospice with the Dr and She stated that she had and was told they would look at it 6 months at time. Per wife the patient is also having bloating and indigestion. Wife stated he is declining.   Plan: Stanley Rios ccontacted Community case Physiological scientist referred back to community to go and re evaluate patient to see if actual change in patient condition Stanley Rios Health Coach will close case at this time  [place] Rios [phone]

## 2018-04-09 ENCOUNTER — Other Ambulatory Visit: Payer: Self-pay | Admitting: *Deleted

## 2018-04-09 NOTE — Patient Outreach (Signed)
[facility]) Care Management  04/09/2018  [name] 1955-12-16 [id number]   Initial telephone outreach   Referral received :9/12 Referral source : [facility] health coach Referral reason : Patient wife reports  decline in patient  condition   62 year old male with PMHx: includes but not limited to stage 3 gold, COPD, history of pulmonary nodule and radiation therapy completed, HTN, PAD, Lower extremity edema, repair of diaphragmatic hernia 10/17 with gortex mesh,  Removal of mesh 2/13/ 2019, hyperlipidemia, spinal stenosis, chronic pain,   Patient well known to this care coordinator, recent transition to health coach with initial contact patient wife concerned regarding decline in condition, states patient complaint of pain and limited movement and discussing DNR and hospice .    Successful outreach call to patient wife,  HIPAA verified x 2 identifiers.  Wife discussed concern regarding patient condition, she discussed patient only feels like walking from bedroom to living room, she is able to help him with a shower about once a week other wise they do a sponge bath. Wife states patient does not have  the get and go to do much.  Patient reports patient wearing oxygen at 2 liters at rest and she increases it to 5 liters for patient mobility.  Wife discussed patient weight at doctor office was 231 and has hasn't weighed at home recently but not as much swelling in his legs.    Wife discussed why patient may not feel like doing much, chronic pain with back, pain at left side of previous hernia repair, lung condition getting worse. Wife asking about getting DNR form completed, concerned regarding patient condition has declined over the last month. Wife discussed she plans to talk to Dr.Durmmond , pulmonary doctor about his opinion on condition. Discussed with wife patient thoughts, she states he has a living will and does not want aggressive measures to be taken, she states  sometimes she states that he is tired and wants to give up and other times he fills like he will be okay.   Wife discussed patient has PCP visit on 10/1 and she plans to discuss her concerns.   Wife is agreeable to home visit, for further assessment of care needs and communicate with PCP regarding concerns.   Plan Will plan home visit in the next week for evaluation of care needs, and care planning and goal setting with patient wife in home.      [name], RN, [place] Management Coordinator  [phone]- Mobile [phone]- Toll Free Main Office

## 2018-04-13 ENCOUNTER — Telehealth: Payer: Self-pay | Admitting: Nurse Practitioner

## 2018-04-13 ENCOUNTER — Other Ambulatory Visit: Payer: Self-pay | Admitting: Nurse Practitioner

## 2018-04-13 DIAGNOSIS — M7918 Myalgia, other site: Secondary | ICD-10-CM

## 2018-04-13 DIAGNOSIS — M159 Polyosteoarthritis, unspecified: Secondary | ICD-10-CM

## 2018-04-13 DIAGNOSIS — M150 Primary generalized (osteo)arthritis: Secondary | ICD-10-CM

## 2018-04-13 MED ORDER — MELOXICAM 15 MG PO TABS: 15.0000 mg | ORAL_TABLET | Freq: Every day | ORAL | 0 refills | Status: AC

## 2018-04-13 MED ORDER — CYCLOBENZAPRINE HCL 10 MG PO TABS: 10.0000 mg | ORAL_TABLET | Freq: Three times a day (TID) | ORAL | 0 refills | Status: AC | PRN

## 2018-04-13 NOTE — Telephone Encounter (Signed)
sent 

## 2018-04-13 NOTE — Telephone Encounter (Signed)
Patient needs cyclobenzaprine and melexocam called in please. Patient has refills on Tramadol and Gabapentin. Has appt for 04-27-18 mm and 05-04-18 RF. Patient is having a really hard time traveling and would like to postpone med refill appt for couple months. Will be here for RF. Wanted to see if we could help him out with this.

## 2018-04-14 ENCOUNTER — Encounter: Payer: Self-pay | Admitting: *Deleted

## 2018-04-14 ENCOUNTER — Other Ambulatory Visit: Payer: Self-pay | Admitting: *Deleted

## 2018-04-14 NOTE — Patient Outreach (Signed)
[facility]) Care Management   04/14/2018  Real Cona. Sep 23, 1955 [id number]  Stanley Rios. is an 62 y.o. male  Subjective:   Patient discussed that this is a pretty good day for as his breathing . He discussed his stomach feeling bloated , reports having daily bowel movement .   Patient wife reports inogen concentrator was not working properly , so she had to bring out concentrator that she had from the past for patient to use on last night. Wife reports that she has contacted inogen company and plans to deliver concentrator to home in the next day.  Wife discussed that she believes that patient breathes better on the regular concentrator vs the inogen concentrator, patient is not sure if there is a difference.   Patient discussed that pain in his back also that contributes to the making his breathing worse at times when he is walking. He reports having a procedure at pain clinic in the next month and hopeful that will work .   Wife discussed wanting a DNR status for patient , is not sure that he is ready for that. He also discussed that he is not interested or need for Hospice services at this time. Wife discussed that they are unable afford palliative care services at this time, wife voiced understand of cost of visit is billed same as a speciality visit .   Objective:  BP 108/60 (BP Location: Left Arm, Patient Position: Sitting, Cuff Size: Large)   Pulse 100   Resp 20   Ht 1.803 m (5\' 11" )   Wt 225 lb (102.1 kg)   SpO2 97%   BMI 31.38 kg/m Observed patient walking from bedroom to living room, no increase in work of breathing, oxygen at 2 liters per regular concentrator, oxygen saturation 96% on arrival to living room . Maintain saturation at 92%  With return walk to room.  Review of Systems  Constitutional: Negative.   HENT: Negative.   Eyes: Negative.   Respiratory: Positive for shortness of breath.   Cardiovascular: Negative.   Gastrointestinal:        Feels bloated   Genitourinary: Negative.   Musculoskeletal: Positive for back pain.  Skin: Negative.   Neurological: Negative.   Endo/Heme/Allergies: Negative.   Psychiatric/Behavioral: Positive for depression.    Physical Exam  Constitutional: He is oriented to person, place, and time. He appears well-developed and well-nourished.  Cardiovascular: Normal rate, normal heart sounds and intact distal pulses.  Respiratory: Effort normal and breath sounds normal.  GI: Soft. Bowel sounds are normal.  Neurological: He is alert and oriented to person, place, and time.  Skin: Skin is warm and dry.  Psychiatric: He has a normal mood and affect. His behavior is normal. Judgment and thought content normal.    Encounter Medications:   Outpatient Encounter Medications as of 04/14/2018  Medication Sig Note  . acetaminophen (TYLENOL) 500 MG tablet Take 1,000 mg by mouth.   Marland Kitchen albuterol (PROVENTIL HFA;VENTOLIN HFA) 108 (90 Base) MCG/ACT inhaler Inhale 2 puffs into the lungs every 4 (four) hours as needed for wheezing or shortness of breath. Reported on 10/26/2015   . buPROPion (WELLBUTRIN) 75 MG tablet Take 75 mg by mouth 2 (two) times daily. Reported on 10/26/2015   . clonazePAM (KLONOPIN) 0.5 MG tablet Take 0.5 mg by mouth 3 (three) times daily as needed.    . Cyanocobalamin (VITAMELTS ENERGY VITAMIN B-12) 1500 MCG TBDP Take 1 tablet by mouth daily. (Patient taking differently:  Take 1 tablet by mouth daily. Taking 2000 mcg daily)   . cyclobenzaprine (FLEXERIL) 10 MG tablet Take 1 tablet (10 mg total) by mouth 3 (three) times daily as needed for muscle spasms.   . DULoxetine (CYMBALTA) 60 MG capsule Take 60 mg by mouth daily. Reported on 10/26/2015   . finasteride (PROSCAR) 5 MG tablet Take 5 mg by mouth daily.   . fluticasone (FLONASE) 50 MCG/ACT nasal spray SHAKE LIQUID AND USE 2 SPRAYS IN EACH NOSTRIL DAILY   . Fluticasone-Umeclidin-Vilant (TRELEGY ELLIPTA) 100-62.5-25 MCG/INH AEPB Inhale 1 puff  into the lungs daily.   . furosemide (LASIX) 20 MG tablet Take two once or twice daily as directed , patient taking 80mg  once daily   . gabapentin (NEURONTIN) 800 MG tablet Take 1 tablet (800 mg total) by mouth every 8 (eight) hours.   Marland Kitchen guaiFENesin (MUCINEX) 600 MG 12 hr tablet Take 600 mg by mouth every 12 (twelve) hours. Patient taking 400 mg 3 times a day   . ibuprofen (ADVIL,MOTRIN) 200 MG tablet Take 400 mg by mouth.   Marland Kitchen ipratropium (ATROVENT HFA) 17 MCG/ACT inhaler Inhale 2 puffs into the lungs every 6 (six) hours.   Marland Kitchen ipratropium-albuterol (DUONEB) 0.5-2.5 (3) MG/3ML SOLN Take 3 mLs by nebulization every 4 (four) hours as needed.   . lidocaine (XYLOCAINE) 5 % ointment Apply 1 application topically 4 (four) times daily as needed for moderate pain. (Patient not taking: Reported on 03/04/2018)   . losartan (COZAAR) 25 MG tablet Take 25 mg by mouth daily. Take 1/2 tablet once daily   . meloxicam (MOBIC) 15 MG tablet Take 1 tablet (15 mg total) by mouth daily.   . metoprolol tartrate (LOPRESSOR) 25 MG tablet Take 1 tablet (25 mg total) by mouth 2 (two) times daily.   Marland Kitchen omeprazole (PRILOSEC) 40 MG capsule Take 40 mg by mouth 2 (two) times daily.   . polyethylene glycol (MIRALAX / GLYCOLAX) packet Take 17 g by mouth daily.   . predniSONE (DELTASONE) 10 MG tablet Take 1 tablet by mouth daily.    . promethazine (PHENERGAN) 25 MG tablet Take 25 mg by mouth every 6 (six) hours as needed.  09/23/2017: Has on hand if needed  . tamsulosin (FLOMAX) 0.4 MG CAPS capsule Take 0.4 mg by mouth daily.   . traMADol (ULTRAM) 50 MG tablet Take 1 tablet (50 mg total) by mouth every 6 (six) hours as needed.   . traZODone (DESYREL) 50 MG tablet Take 150 mg by mouth at bedtime.   . Vitamin D, Ergocalciferol, (DRISDOL) 50000 units CAPS capsule Take 1 capsule (50,000 Units total) by mouth 2 (two) times a week x 6 weeks. (Patient not taking: Reported on 03/04/2018)    No facility-administered encounter medications on file  as of 04/14/2018.     Functional Status:   In your present state of health, do you have any difficulty performing the following activities: 09/17/2017  Hearing? N  Vision? N  Difficulty concentrating or making decisions? Y  Walking or climbing stairs? Y  Comment using walker, forgetful at time   Dressing or bathing? Y  Comment wife assist   Doing errands, shopping? Y  Comment wife/familly  assist with Therapist, occupational and eating ? N  Comment wife prepares  Using the Toilet? N  In the past six months, have you accidently leaked urine? N  Do you have problems with loss of bowel control? N  Managing your Medications? Y  Comment wife helps  Managing your Finances? Y  Comment wife assist   Housekeeping or managing your Housekeeping? N  Comment wife does house keeping   Some recent data might be hidden    Fall/Depression Screening:    Fall Risk  01/25/2018 09/23/2017 08/12/2017  Falls in the past year? No No No  Risk for fall due to : - Impaired balance/gait;Impaired mobility -  Risk for fall due to: Comment - - -   PHQ 2/9 Scores 01/25/2018 09/23/2017 08/12/2017 07/30/2017 06/09/2017 05/13/2017 03/04/2017  PHQ - 2 Score 2 2 0 0 0 4 3  PHQ- 9 Score 3 4 - - - 12 10    Assessment:  Routine home visit   COPD- reports breathing in the green zone today when reviewed. Patient wife  discussed that she plan  to monitor oxygen saturation on regular concentrator vs inogen concentrator and to discuss with Dr.Drummond nurse.  Patient discussed decrease use in rescue inhaler since use of Trelegy .   Heart failure - not monitoring weights at home in over a month. Decrease in swelling in legs. Today weight is 225.   Hypertension  Recent increase in losartan, not consistently monitoring blood pressure at home.    Replacement Inogen  concentrator has been delivered prior to end of home visit.   Plan:  Will send PCP visit note as well as involvement letter.  Will plan home visit in the  next month.   THN CM Care Plan Problem One     Most Recent Value  Care Plan Problem One  Patient with history of COPD , self care managment concerns   Role Documenting the Problem One  Care Management Coordinator  Care Plan for Problem One  Active  THN Long Term Goal   Patient will be able to report increased knowledge of managment of COPD over the next 60 days   THN Long Term Goal Start Date  04/14/18  Interventions for Problem One Long Term Goal  Home visit , reinforced continuing to take medication as prescribed, reviewed use of rescue inhaler as needed,Reviewed COPD zones  chart    THN CM Short Term Goal #1   Patient will be able to report improved breathing with activity over the next 30 days   THN CM Short Term Goal #1 Start Date  04/14/18  Interventions for Short Term Goal #1  Reviewed measures to help with improving breathing with activity , pretreat with dose of albuterol nebulizer, reviewed purse lip breathing , encouraged to increae oxygen level up 4 liters with activity per MD recommendation prior to walking .   THN CM Short Term Goal #2   Patient will be able to report walking at least 2 laps in home daily over the next 30 days   THN CM Short Term Goal #2 Start Date  04/14/18  Interventions for Short Term Goal #2  Discussed with patient how activity exercise can improve body's use of oxygen , help with anxiety , improve shortness of breath , encouraged use of excercise hand cycle.     THN CM Care Plan Problem Two     Most Recent Value  Care Plan Problem Two  Knowledge related to Heart failure self care managment   Role Documenting the Problem Two  Care Management Coordinator  Care Plan for Problem Two  Active  Interventions for Problem Two Long Term Goal   Discussed importance of taking medications as prescribed, Reviewed Heart failure zone chart and action plans keeping all MD appointment and  monitoring weights and blood pressure as recommended   THN Long Term Goal  Patient will  be able to report increase knowledge of Heart failure managment  over the next 60 days   THN Long Term Goal Start Date  04/14/18  [facility] CM Short Term Goal #1   Patient will be able to reports weighing daily and keeping a record over the next 30 days   THN CM Short Term Goal #1 Start Date  04/14/18  Interventions for Short Term Goal #2   Advised patient regarding best time of day to weigh in the morning , after going to bathroom and weighing in same amount of clothing , encouraged to keep a log to identify sudden weight increases.   THN CM Short Term Goal #2   Patient will report checking blood pressure at least 5 days a week and keeping a record over the next 30 days   THN CM Short Term Goal #2 Start Date  04/14/18  Interventions for Short Term Goal #2  Discussed monitioring blood pressure daily notify MD of concerns of low blood pressure less than 100/ or symptoms of weakness, dizziness.       [name], RN, [place] Management Coordinator  [phone]- Mobile [phone]- Toll Free Main Office

## 2018-04-27 ENCOUNTER — Encounter: Payer: PPO | Admitting: Nurse Practitioner

## 2018-04-29 DIAGNOSIS — K589 Irritable bowel syndrome without diarrhea: Secondary | ICD-10-CM | POA: Diagnosis not present

## 2018-04-29 DIAGNOSIS — I10 Essential (primary) hypertension: Secondary | ICD-10-CM | POA: Diagnosis not present

## 2018-04-29 DIAGNOSIS — J439 Emphysema, unspecified: Secondary | ICD-10-CM | POA: Diagnosis not present

## 2018-04-29 DIAGNOSIS — G894 Chronic pain syndrome: Secondary | ICD-10-CM | POA: Diagnosis not present

## 2018-05-04 ENCOUNTER — Ambulatory Visit (HOSPITAL_BASED_OUTPATIENT_CLINIC_OR_DEPARTMENT_OTHER): Payer: PPO | Admitting: Pain Medicine

## 2018-05-04 ENCOUNTER — Encounter: Payer: Self-pay | Admitting: Pain Medicine

## 2018-05-04 ENCOUNTER — Ambulatory Visit
Admission: RE | Admit: 2018-05-04 | Discharge: 2018-05-04 | Disposition: A | Discharge status: Home / Self Care | Payer: PPO | Source: Ambulatory Visit | Attending: Pain Medicine | Admitting: Pain Medicine

## 2018-05-04 ENCOUNTER — Other Ambulatory Visit: Payer: Self-pay

## 2018-05-04 VITALS — BP 131/89 | HR 114 | Temp 96.9°F | Resp 14 | Ht 71.0 in | Wt 228.0 lb

## 2018-05-04 DIAGNOSIS — M5136 Other intervertebral disc degeneration, lumbar region: Secondary | ICD-10-CM | POA: Insufficient documentation

## 2018-05-04 DIAGNOSIS — M4310 Spondylolisthesis, site unspecified: Secondary | ICD-10-CM

## 2018-05-04 DIAGNOSIS — Z9889 Other specified postprocedural states: Secondary | ICD-10-CM | POA: Diagnosis not present

## 2018-05-04 DIAGNOSIS — M47816 Spondylosis without myelopathy or radiculopathy, lumbar region: Secondary | ICD-10-CM

## 2018-05-04 DIAGNOSIS — Z9049 Acquired absence of other specified parts of digestive tract: Secondary | ICD-10-CM | POA: Diagnosis not present

## 2018-05-04 DIAGNOSIS — Z981 Arthrodesis status: Secondary | ICD-10-CM | POA: Insufficient documentation

## 2018-05-04 DIAGNOSIS — G8929 Other chronic pain: Secondary | ICD-10-CM

## 2018-05-04 DIAGNOSIS — G8918 Other acute postprocedural pain: Secondary | ICD-10-CM

## 2018-05-04 DIAGNOSIS — M545 Low back pain, unspecified: Secondary | ICD-10-CM

## 2018-05-04 DIAGNOSIS — M51369 Other intervertebral disc degeneration, lumbar region without mention of lumbar back pain or lower extremity pain: Secondary | ICD-10-CM

## 2018-05-04 MED ORDER — LIDOCAINE HCL 2 % IJ SOLN
20.0000 mL | Freq: Once | INTRAMUSCULAR | Status: AC
  Administered 2018-05-04: 400 mg
  Filled 2018-05-04: qty 20

## 2018-05-04 MED ORDER — MIDAZOLAM HCL 5 MG/5ML IJ SOLN
1.0000 mg | INTRAMUSCULAR | Status: DC | PRN
  Administered 2018-05-04: 1 mg via INTRAVENOUS
  Filled 2018-05-04: qty 5

## 2018-05-04 MED ORDER — ROPIVACAINE HCL 2 MG/ML IJ SOLN
9.0000 mL | Freq: Once | INTRAMUSCULAR | Status: AC
  Administered 2018-05-04: 10 mL via PERINEURAL
  Filled 2018-05-04: qty 10

## 2018-05-04 MED ORDER — OXYCODONE-ACETAMINOPHEN 5-325 MG PO TABS: 1.0000 | ORAL_TABLET | Freq: Three times a day (TID) | ORAL | 0 refills | Status: AC | PRN

## 2018-05-04 MED ORDER — FENTANYL CITRATE (PF) 100 MCG/2ML IJ SOLN
25.0000 ug | INTRAMUSCULAR | Status: DC | PRN
  Administered 2018-05-04: 50 ug via INTRAVENOUS
  Filled 2018-05-04: qty 2

## 2018-05-04 MED ORDER — TRIAMCINOLONE ACETONIDE 40 MG/ML IJ SUSP
40.0000 mg | Freq: Once | INTRAMUSCULAR | Status: AC
  Administered 2018-05-04: 40 mg
  Filled 2018-05-04: qty 1

## 2018-05-04 MED ORDER — LACTATED RINGERS IV SOLN
1000.0000 mL | Freq: Once | INTRAVENOUS | Status: AC
  Administered 2018-05-04: 1000 mL via INTRAVENOUS

## 2018-05-04 NOTE — Progress Notes (Signed)
Safety precautions to be maintained throughout the outpatient stay will include: orient to surroundings, keep bed in low position, maintain call bell within reach at all times, provide assistance with transfer out of bed and ambulation.  

## 2018-05-04 NOTE — Progress Notes (Signed)
Patient's Name: Stanley Rios.  MRN: [id number]  Referring Provider: Vevelyn Francois, NP  DOB: 07-May-1956  PCP: Marygrace Drought, MD  DOS: 05/04/2018  Note by: Gaspar Cola, MD  Service setting: Ambulatory outpatient  Specialty: Interventional Pain Management  Patient type: Established  Location: [facility] (AMB) Pain Management Facility  Visit type: Interventional Procedure   Primary Reason for Visit: Interventional Pain Management Treatment. CC: Back Pain (left, lower)  Procedure:          Anesthesia, Analgesia, Anxiolysis:  Type: Thermal Lumbar Facet, Medial Branch Radiofrequency Ablation/Neurotomy  #2 (last one done on 06/09/2017) Primary Purpose: Therapeutic Region: Posterolateral Lumbosacral Spine Level: L2, L3, L4, L5, & S1 Medial Branch Level(s). These levels will denervate the L3-4, L4-5, and the L5-S1 lumbar facet joints. Laterality: Left  Type: Moderate (Conscious) Sedation combined with Local Anesthesia Indication(s): Analgesia and Anxiety Route: Intravenous (IV) IV Access: Secured Sedation: Meaningful verbal contact was maintained at all times during the procedure  Local Anesthetic: Lidocaine 1-2%  Position: Prone   Indications: 1. Spondylosis without myelopathy or radiculopathy, lumbar region   2. Lumbar facet arthropathy (Bilateral)   3. Lumbar Grade 1 Retrolisthesis of L4 on L5   4. Chronic low back pain (Primary Source of Pain) (Bilateral) (R>L)   5. DDD (degenerative disc disease), lumbar    Mr. Geisinger has been dealing with the above chronic pain for longer than three months and has either failed to respond, was unable to tolerate, or simply did not get enough benefit from other more conservative therapies including, but not limited to: 1. Over-the-counter medications 2. Anti-inflammatory medications 3. Muscle relaxants 4. Membrane stabilizers 5. Opioids 6. Physical therapy and/or chiropractic manipulation 7. Modalities (Heat, ice, etc.) 8. Invasive  techniques such as nerve blocks. Mr. Hochstetler has attained more than 50% relief of the pain from a series of diagnostic injections conducted in separate occasions.  Pain Score: Pre-procedure: 4 /10 Post-procedure: 0-No pain/10  Pre-op Assessment:  Mr. Iqbal is a 62 y.o. (year old), male patient, seen today for interventional treatment. He  has a past surgical history that includes Cholecystectomy; Spinal fusion; Neck surgery (2002 approx); Hernia repair; and Thoracotomy , removal of mesh from prevous hernia repair  (Left). Mr. Charnley has a current medication list which includes the following prescription(s): acetaminophen, albuterol, bupropion, clonazepam, cyclobenzaprine, duloxetine, finasteride, fluticasone, fluticasone-umeclidin-vilant, furosemide, gabapentin, guaifenesin, ibuprofen, ipratropium, ipratropium-albuterol, losartan, meloxicam, metoprolol tartrate, omeprazole, polyethylene glycol, prednisone, promethazine, rosuvastatin, tamsulosin, tramadol, trazodone, cyanocobalamin, oxycodone-acetaminophen, oxycodone-acetaminophen, and vitamin d (ergocalciferol), and the following Facility-Administered Medications: fentanyl and midazolam. His primarily concern today is the Back Pain (left, lower)  Initial Vital Signs:  Pulse/HCG Rate: (!) 114ECG Heart Rate: (!) 108 Temp: 97.8 F (36.6 C) Resp: 18 BP: 108/82 SpO2: 94 %(O2 at 3L per [facility])  BMI: Estimated body mass index is 31.8 kg/m as calculated from the following:   Height as of this encounter: 5' 11"  (1.803 m).   Weight as of this encounter: 228 lb (103.4 kg).  Risk Assessment: Allergies: Reviewed. He has No Known Allergies.  Allergy Precautions: None required Coagulopathies: Reviewed. None identified.  Blood-thinner therapy: None at this time Active Infection(s): Reviewed. None identified. Mr. Juenger is afebrile  Site Confirmation: Mr. Khachatryan was asked to confirm the procedure and laterality before marking the site Procedure checklist:  Completed Consent: Before the procedure and under the influence of no sedative(s), amnesic(s), or anxiolytics, the patient was informed of the treatment options, risks and possible complications. To fulfill our ethical and  legal obligations, as recommended by the American Medical Association's Code of Ethics, I have informed the patient of my clinical impression; the nature and purpose of the treatment or procedure; the risks, benefits, and possible complications of the intervention; the alternatives, including doing nothing; the risk(s) and benefit(s) of the alternative treatment(s) or procedure(s); and the risk(s) and benefit(s) of doing nothing. The patient was provided information about the general risks and possible complications associated with the procedure. These may include, but are not limited to: failure to achieve desired goals, infection, bleeding, organ or nerve damage, allergic reactions, paralysis, and death. In addition, the patient was informed of those risks and complications associated to Spine-related procedures, such as failure to decrease pain; infection (i.e.: Meningitis, epidural or intraspinal abscess); bleeding (i.e.: epidural hematoma, subarachnoid hemorrhage, or any other type of intraspinal or peri-dural bleeding); organ or nerve damage (i.e.: Any type of peripheral nerve, nerve root, or spinal cord injury) with subsequent damage to sensory, motor, and/or autonomic systems, resulting in permanent pain, numbness, and/or weakness of one or several areas of the body; allergic reactions; (i.e.: anaphylactic reaction); and/or death. Furthermore, the patient was informed of those risks and complications associated with the medications. These include, but are not limited to: allergic reactions (i.e.: anaphylactic or anaphylactoid reaction(s)); adrenal axis suppression; blood sugar elevation that in diabetics may result in ketoacidosis or comma; water retention that in patients with history  of congestive heart failure may result in shortness of breath, pulmonary edema, and decompensation with resultant heart failure; weight gain; swelling or edema; medication-induced neural toxicity; particulate matter embolism and blood vessel occlusion with resultant organ, and/or nervous system infarction; and/or aseptic necrosis of one or more joints. Finally, the patient was informed that Medicine is not an exact science; therefore, there is also the possibility of unforeseen or unpredictable risks and/or possible complications that may result in a catastrophic outcome. The patient indicated having understood very clearly. We have given the patient no guarantees and we have made no promises. Enough time was given to the patient to ask questions, all of which were answered to the patient's satisfaction. Mr. Merlos has indicated that he wanted to continue with the procedure. Attestation: I, the ordering provider, attest that I have discussed with the patient the benefits, risks, side-effects, alternatives, likelihood of achieving goals, and potential problems during recovery for the procedure that I have provided informed consent. Date  Time: 05/04/2018 10:15 AM  Pre-Procedure Preparation:  Monitoring: As per clinic protocol. Respiration, ETCO2, SpO2, BP, heart rate and rhythm monitor placed and checked for adequate function Safety Precautions: Patient was assessed for positional comfort and pressure points before starting the procedure. Time-out: I initiated and conducted the "Time-out" before starting the procedure, as per protocol. The patient was asked to participate by confirming the accuracy of the "Time Out" information. Verification of the correct person, site, and procedure were performed and confirmed by me, the nursing staff, and the patient. "Time-out" conducted as per Joint Commission's Universal Protocol (UP.01.01.01). Time: 1150  Description of Procedure:          Laterality: Left Levels:   L2, L3, L4, L5, & S1 Medial Branch Level(s), at the L3-4, L4-5, and the L5-S1 lumbar facet joints. Area Prepped: Lumbosacral Prepping solution: ChloraPrep (2% chlorhexidine gluconate and 70% isopropyl alcohol) Safety Precautions: Aspiration looking for blood return was conducted prior to all injections. At no point did we inject any substances, as a needle was being advanced. Before injecting, the patient was told to  immediately notify me if he was experiencing any new onset of "ringing in the ears, or metallic taste in the mouth". No attempts were made at seeking any paresthesias. Safe injection practices and needle disposal techniques used. Medications properly checked for expiration dates. SDV (single dose vial) medications used. After the completion of the procedure, all disposable equipment used was discarded in the proper designated medical waste containers. Local Anesthesia: Protocol guidelines were followed. The patient was positioned over the fluoroscopy table. The area was prepped in the usual manner. The time-out was completed. The target area was identified using fluoroscopy. A 12-in long, straight, sterile hemostat was used with fluoroscopic guidance to locate the targets for each level blocked. Once located, the skin was marked with an approved surgical skin marker. Once all sites were marked, the skin (epidermis, dermis, and hypodermis), as well as deeper tissues (fat, connective tissue and muscle) were infiltrated with a small amount of a short-acting local anesthetic, loaded on a 10cc syringe with a 25G, 1.5-in  Needle. An appropriate amount of time was allowed for local anesthetics to take effect before proceeding to the next step. Local Anesthetic: Lidocaine 2.0% The unused portion of the local anesthetic was discarded in the proper designated containers. Technical explanation of process:  Radiofrequency Ablation (RFA) L2 Medial Branch Nerve RFA: The target area for the L2 medial branch  is at the junction of the postero-lateral aspect of the superior articular process and the superior, posterior, and medial edge of the transverse process of L3. Under fluoroscopic guidance, a Radiofrequency needle was inserted until contact was made with os over the superior postero-lateral aspect of the pedicular shadow (target area). Sensory and motor testing was conducted to properly adjust the position of the needle. Once satisfactory placement of the needle was achieved, the numbing solution was slowly injected after negative aspiration for blood. 2.0 mL of the nerve block solution was injected without difficulty or complication. After waiting for at least 3 minutes, the ablation was performed. Once completed, the needle was removed intact. L3 Medial Branch Nerve RFA: The target area for the L3 medial branch is at the junction of the postero-lateral aspect of the superior articular process and the superior, posterior, and medial edge of the transverse process of L4. Under fluoroscopic guidance, a Radiofrequency needle was inserted until contact was made with os over the superior postero-lateral aspect of the pedicular shadow (target area). Sensory and motor testing was conducted to properly adjust the position of the needle. Once satisfactory placement of the needle was achieved, the numbing solution was slowly injected after negative aspiration for blood. 2.0 mL of the nerve block solution was injected without difficulty or complication. After waiting for at least 3 minutes, the ablation was performed. Once completed, the needle was removed intact. L4 Medial Branch Nerve RFA: The target area for the L4 medial branch is at the junction of the postero-lateral aspect of the superior articular process and the superior, posterior, and medial edge of the transverse process of L5. Under fluoroscopic guidance, a Radiofrequency needle was inserted until contact was made with os over the superior postero-lateral  aspect of the pedicular shadow (target area). Sensory and motor testing was conducted to properly adjust the position of the needle. Once satisfactory placement of the needle was achieved, the numbing solution was slowly injected after negative aspiration for blood. 2.0 mL of the nerve block solution was injected without difficulty or complication. After waiting for at least 3 minutes, the ablation was performed. Once  completed, the needle was removed intact. L5 Medial Branch Nerve RFA: The target area for the L5 medial branch is at the junction of the postero-lateral aspect of the superior articular process of S1 and the superior, posterior, and medial edge of the sacral ala. Under fluoroscopic guidance, a Radiofrequency needle was inserted until contact was made with os over the superior postero-lateral aspect of the pedicular shadow (target area). Sensory and motor testing was conducted to properly adjust the position of the needle. Once satisfactory placement of the needle was achieved, the numbing solution was slowly injected after negative aspiration for blood. 2.0 mL of the nerve block solution was injected without difficulty or complication. After waiting for at least 3 minutes, the ablation was performed. Once completed, the needle was removed intact. S1 Medial Branch Nerve RFA: The target area for the S1 medial branch is located inferior to the junction of the S1 superior articular process and the L5 inferior articular process, posterior, inferior, and lateral to the 6 o'clock position of the L5-S1 facet joint, just superior to the S1 posterior foramen. Under fluoroscopic guidance, the Radiofrequency needle was advanced until contact was made with os over the Target area. Sensory and motor testing was conducted to properly adjust the position of the needle. Once satisfactory placement of the needle was achieved, the numbing solution was slowly injected after negative aspiration for blood. 2.0 mL of the  nerve block solution was injected without difficulty or complication. After waiting for at least 3 minutes, the ablation was performed. Once completed, the needle was removed intact. Radiofrequency lesioning (ablation):  Radiofrequency Generator: NeuroTherm [id number] Sensory Stimulation Parameters: 50 Hz was used to locate & identify the nerve, making sure that the needle was positioned such that there was no sensory stimulation below 0.3 V or above 0.7 V. Motor Stimulation Parameters: 2 Hz was used to evaluate the motor component. Care was taken not to lesion any nerves that demonstrated motor stimulation of the lower extremities at an output of less than 2.5 times that of the sensory threshold, or a maximum of 2.0 V. Lesioning Technique Parameters: Standard Radiofrequency settings. (Not bipolar or pulsed.) Temperature Settings: 80 degrees C Lesioning time: 60 seconds Intra-operative Compliance: Compliant.  However, because of the patient's other medical conditions include his ascites, diaphragmatic hernia, COPD, oxygen dependence, breathing problems, osteopenia, and progressive cognitive impairment, it was very difficult to get information from patient during the procedure.  In addition, his lumbar spondylosis has progressed making it very difficult to anatomically distinguish landmarks, especially because of his osteopenia. Materials & Medications: Needle(s) (Electrode/Cannula) Type: Teflon-coated, curved tip, Radiofrequency needle(s) Gauge: 20G Length: 15cm Numbing solution: 0.2% PF-Ropivacaine + Triamcinolone (40 mg/mL) diluted to a final concentration of 4 mg of Triamcinolone/mL of Ropivacaine The unused portion of the solution was discarded in the proper designated containers.  Once the entire procedure was completed, the treated area was cleaned, making sure to leave some of the prepping solution back to take advantage of its long term bactericidal properties.  Illustration of the posterior  view of the lumbar spine and the posterior neural structures. Laminae of L2 through S1 are labeled. DPRL5, dorsal primary ramus of L5; DPRS1, dorsal primary ramus of S1; DPR3, dorsal primary ramus of L3; FJ, facet (zygapophyseal) joint L3-L4; I, inferior articular process of L4; LB1, lateral branch of dorsal primary ramus of L1; IAB, inferior articular branches from L3 medial branch (supplies L4-L5 facet joint); IBP, intermediate branch plexus; MB3, medial branch of dorsal primary ramus  of L3; NR3, third lumbar nerve root; S, superior articular process of L5; SAB, superior articular branches from L4 (supplies L4-5 facet joint also); TP3, transverse process of L3.  Vitals:   05/04/18 1251 05/04/18 1302 05/04/18 1312 05/04/18 1322  BP: 102/81 121/85 (!) 119/98 131/89  Pulse:      Resp: 13 16 15 14   Temp: (!) 96.9 F (36.1 C)     TempSrc:      SpO2: 98% 99% 98% 97%  Weight:      Height:        Start Time: 1150 hrs. End Time: 1246 hrs.  Imaging Guidance (Spinal):          Type of Imaging Technique: Fluoroscopy Guidance (Spinal) Indication(s): Assistance in needle guidance and placement for procedures requiring needle placement in or near specific anatomical locations not easily accessible without such assistance. Exposure Time: Please see nurses notes. Contrast: None used. Fluoroscopic Guidance: I was personally present during the use of fluoroscopy. "Tunnel Vision Technique" used to obtain the best possible view of the target area. Parallax error corrected before commencing the procedure. "Direction-depth-direction" technique used to introduce the needle under continuous pulsed fluoroscopy. Once target was reached, antero-posterior, oblique, and lateral fluoroscopic projection used confirm needle placement in all planes. Images permanently stored in EMR. Interpretation: No contrast injected. I personally interpreted the imaging intraoperatively. Adequate needle placement confirmed in multiple  planes. Permanent images saved into the patient's record.  Antibiotic Prophylaxis:   Anti-infectives (From admission, onward)   None     Indication(s): None identified  Post-operative Assessment:  Post-procedure Vital Signs:  Pulse/HCG Rate: (!) 114(!) 110 Temp: (!) 96.9 F (36.1 C) Resp: 14 BP: 131/89 SpO2: 97 %  EBL: None  Complications: No immediate post-treatment complications observed by team, or reported by patient.  Note: The patient tolerated the entire procedure well. A repeat set of vitals were taken after the procedure and the patient was kept under observation following institutional policy, for this type of procedure. Post-procedural neurological assessment was performed, showing return to baseline, prior to discharge. The patient was provided with post-procedure discharge instructions, including a section on how to identify potential problems. Should any problems arise concerning this procedure, the patient was given instructions to immediately contact us, at any time, without hesitation. In any case, we plan to contact the patient by telephone for a follow-up status report regarding this interventional procedure.  Comments:  Because of the degree of technical difficulties observed during this procedure, I get very little hope that this is been a provide him with any significant benefit.  In addition, it is my opinion that his general medical condition has been deteriorating to the point where the risk are beginning to outweigh the benefits.   Laboratory Chemistry  Inflammation Markers (CRP: Acute Phase) (ESR: Chronic Phase) Lab Results  Component Value Date   CRP <0.8 08/07/2016   ESRSEDRATE 24 (H) 08/07/2016   LATICACIDVEN 0.9 04/06/2016                         Rheumatology Markers No results found.  Renal Function Markers Lab Results  Component Value Date   BUN 12 08/07/2016   CREATININE 0.90 08/07/2016   GFRAA >60 08/07/2016   GFRNONAA >60 08/07/2016                              Hepatic Function Markers Lab Results  Component  Value Date   AST 20 08/07/2016   ALT 16 (L) 08/07/2016   ALBUMIN 3.6 08/07/2016   ALKPHOS 80 08/07/2016   LIPASE 16 01/16/2016                        Electrolytes Lab Results  Component Value Date   NA 142 08/07/2016   K 3.6 08/07/2016   CL 108 08/07/2016   CALCIUM 8.9 08/07/2016   MG 1.7 08/07/2016   PHOS 3.0 04/06/2016                        Neuropathy Markers Lab Results  Component Value Date   VITAMINB12 176 (L) 08/07/2016   HIV NON REACTIVE 04/10/2016                        CNS Tests No results found.  Bone Pathology Markers Lab Results  Component Value Date   25OHVITD1 27 (L) 08/07/2016   25OHVITD2 20 08/07/2016   25OHVITD3 7.0 08/07/2016                         Coagulation Parameters Lab Results  Component Value Date   INR 0.83 04/11/2016   LABPROT 11.4 04/11/2016   PLT 318 04/18/2016                        Cardiovascular Markers Lab Results  Component Value Date   TROPONINI <0.03 04/07/2016   HGB 12.5 (L) 04/18/2016   HCT 36.8 (L) 04/18/2016                         CA Markers No results found.  Note: Lab results reviewed.  There are updated labs under the [facility] system for 12/31/2017.  Plan of Care   Possible POC:  NO FURTHER INTERVENTIONAL THERAPIES - No further interventional therapies as the risks now outweigh the benefits.   Imaging Orders     DG C-Arm 1-60 Min-No Report  Procedure Orders     Radiofrequency,Lumbar  Medications ordered for procedure: Meds ordered this encounter  Medications  . lidocaine (XYLOCAINE) 2 % (with pres) injection 400 mg  . midazolam (VERSED) 5 MG/5ML injection 1-2 mg    Make sure Flumazenil is available in the pyxis when using this medication. If oversedation occurs, administer 0.2 mg IV over 15 sec. If after 45 sec no response, administer 0.2 mg again over 1 min; may repeat at 1 min intervals; not to exceed 4 doses (1 mg)  .  fentaNYL (SUBLIMAZE) injection 25-50 mcg    Make sure Narcan is available in the pyxis when using this medication. In the event of respiratory depression (RR< 8/min): Titrate NARCAN (naloxone) in increments of 0.1 to 0.2 mg IV at 2-3 minute intervals, until desired degree of reversal.  . lactated ringers infusion 1,000 mL  . ropivacaine (PF) 2 mg/mL (0.2%) (NAROPIN) injection 9 mL  . triamcinolone acetonide (KENALOG-40) injection 40 mg  . oxyCODONE-acetaminophen (PERCOCET) 5-325 MG tablet    Sig: Take 1 tablet by mouth every 8 (eight) hours as needed for up to 7 days for severe pain. Must last 7 days.    Dispense:  21 tablet    Refill:  0    For acute post-operative pain. Not to be refilled.  Must last 7 days.  Marland Kitchen oxyCODONE-acetaminophen (PERCOCET) 5-325 MG tablet  Sig: Take 1 tablet by mouth every 8 (eight) hours as needed for up to 7 days for severe pain. Must last 7 days.    Dispense:  21 tablet    Refill:  0    For acute post-operative pain. Not to be refilled.  Must last 7 days.   Medications administered: We administered lidocaine, midazolam, fentaNYL, lactated ringers, ropivacaine (PF) 2 mg/mL (0.2%), and triamcinolone acetonide.  See the medical record for exact dosing, route, and time of administration.  Disposition: Discharge home  Discharge Date & Time: 05/04/2018; 1323 hrs.   Physician-requested Follow-up: Return for post-procedure eval (2 wks), w/ Dr. Dossie Arbour.  Future Appointments  Date Time Provider [place]  05/19/2018  1:30 PM Milinda Pointer, MD [facility] None   Primary Care Physician: Marygrace Drought, MD Location: [facility] Outpatient Pain Management Facility Note by: Gaspar Cola, MD Date: 05/04/2018; Time: 1:25 PM  Disclaimer:  Medicine is not an exact science. The only guarantee in medicine is that nothing is guaranteed. It is important to note that the decision to proceed with this intervention was based on the information collected from the  patient. The Data and conclusions were drawn from the patient's questionnaire, the interview, and the physical examination. Because the information was provided in large part by the patient, it cannot be guaranteed that it has not been purposely or unconsciously manipulated. Every effort has been made to obtain as much relevant data as possible for this evaluation. It is important to note that the conclusions that lead to this procedure are derived in large part from the available data. Always take into account that the treatment will also be dependent on availability of resources and existing treatment guidelines, considered by other Pain Management Practitioners as being common knowledge and practice, at the time of the intervention. For Medico-Legal purposes, it is also important to point out that variation in procedural techniques and pharmacological choices are the acceptable norm. The indications, contraindications, technique, and results of the above procedure should only be interpreted and judged by a Board-Certified Interventional Pain Specialist with extensive familiarity and expertise in the same exact procedure and technique.

## 2018-05-04 NOTE — Patient Instructions (Signed)

## 2018-05-05 ENCOUNTER — Telehealth: Payer: Self-pay

## 2018-05-05 NOTE — Telephone Encounter (Signed)
No answer. Message left on answering machine to call if needed.

## 2018-05-11 ENCOUNTER — Other Ambulatory Visit: Payer: Self-pay | Admitting: *Deleted

## 2018-05-11 NOTE — Patient Outreach (Signed)
[place] [facility]) Care Management  05/11/2018  Mat Stuard. [birth year] [id number]   Referral received :9/12 Referral source : [facility] health coach Referral reason : Patient wife reports  decline in patient  condition   62 year old male with PMHx: includes but not limited to stage 3 gold, COPD, history of pulmonary nodule and radiation therapy completed, HTN, PAD, Lower extremity edema, repair of diaphragmatic hernia 10/17 with gortex mesh,  Removal of mesh 2/13/ 2019, hyperlipidemia, spinal stenosis, chronic pain   Incoming call from patient wife, to update regarding patient condition, she is speaking very fast and anxious sounding .   Patient wife discussed patient has had good days and bad days. She discussed patient episode of having difficulty with being short of breath 1 night ago ,his was anxious , had a cough , reports oxygen saturation was in the 80 to 90's. Wife discussed problems with Inogen concentrator when increased to 5, alarming so she connected patient to older concentrator that she had. Wife discussed her plan to get a new concentrator once she gets [facility] care of , she states that she believes patient breathing is better on the regular concentrator vs the inogen concentrator.   Wife discussed patient is doing pretty good on today, he is wearing oxygen at 3 liters at rest today, oxygen saturation is in the 90's, reports that he does not have a cough ,denies having a fever. Reports that he has eaten today, pain control is some what improved since last pain clinic visit. Wife reports that pain will no longer be able to be treated to pain clinic and his last visit will be next week, due to concern related to medical condition.  Wife continues to state she feels patient needs Hospice services and patient states if pain can't be treated he would be interested in Hospice service. Wife discussed difficulty in managing patient care at times, she  has to get someone to stay with him when she goes out.   Wife discussed patient recent visit to Pain clinic on 10/8  and discussed reported problems noted  as per wife with  kidney's , abdominal area swelling, noted on imaging at pain clinic visit per wife .  Wife  reports recent visit to PCP on 10/3 prior to pain clinic .  Wife states that she has contacted PCP office to discuss reported possible problem noted from pain clinic visit, and patient recent problems with shortness of breath. Wife has also placed call to Dr.Drummond to discuss concerns , she as asked both offices regarding office visit, office to return call to her on tomorrow. Wife states she is going to discuss Hospice services again with MD, .   Discussed with wife worsening symptoms of COPD, and Heart failure and with yellow zone actions of taking medications as prescribed, using nebulizer treatments. Wife reports patient is taking medications as prescribed, using purse lip breathing technique and incentive spirometry.  Wife reports that patient  does have swelling in right leg, his abdomen is larger she is unsure if it is fluid or fat. She discussed that patient has not felt like weighing daily, recent weight at office 228.   Plan Will plan return call in the next 3 days for follow up on PCP office follow up.  Reinforced with  wife to seek medical care for red zone symptoms as reviewed.    [facility] CM Care Plan Problem One     Most Recent Value  Care Plan Problem One  Patient with history of COPD , self care managment concerns   Role Documenting the Problem One  Care Management Coordinator  Care Plan for Problem One  Active  THN Long Term Goal   Patient will be able to report increased knowledge of managment of COPD over the next 35 days   THN Long Term Goal Start Date  04/14/18  Interventions for Problem One Long Term Goal  Advised regarding zones , yellow interventions, including a PCP office visit and red zone 911  THN CM Short  Term Goal #1   Patient will be able to report improved breathing with activity over the next 30 days   THN CM Short Term Goal #1 Start Date  04/14/18  Interventions for Short Term Goal #1  Reinforced breathing techniques, purse lip, use of incentive , increasing oxgyen prior to mobility l  THN CM Short Term Goal #2   Patient will be able to report walking at least 2 laps in home daily over the next 30 days   THN CM Short Term Goal #2 Start Date  04/14/18    [facility] CM Care Plan Problem Two     Most Recent Value  Care Plan Problem Two  Knowledge related to Heart failure self care managment   Role Documenting the Problem Two  Care Management [place] for Problem Two  Active  Interventions for Problem Two Long Term Goal   Reviewed worsening symptoms of Heart failure and  action plan , importance of seeking medical attention .   THN Long Term Goal  Patient will be able to report increase knowledge of Heart failure managment  over the next 60 days   THN Long Term Goal Start Date  04/14/18  [facility] CM Short Term Goal #1   Patient will be able to reports weighing daily and keeping a record over the next 30 days   THN CM Short Term Goal #1 Start Date  04/14/18  Interventions for Short Term Goal #2   Reinforced weighing daily and keeping a record , to help with identifying sudden weight changes to interventiion to put in place   [facility] CM Short Term Goal #2   Patient will report checking blood pressure at least 5 days a week and keeping a record over the next 30 days   [facility] CM Short Term Goal #2 Start Date  04/14/18       [name], RN, [place] Management Coordinator  [phone]- Mobile [phone]- [place]

## 2018-05-14 ENCOUNTER — Other Ambulatory Visit: Payer: Self-pay | Admitting: *Deleted

## 2018-05-14 NOTE — Patient Outreach (Addendum)
[place] [facility]) Care Management  05/14/2018  Thelma Viana. [birth year] [id number]   Telephone follow up call   Successful telephone call to patient wife to follow up on her contact with PCP office regarding an appointment.  Patient wife reports that she was able to communicate by patient portal and telephone  to Dr. Guadelupe Sabin office her concerns regarding , patient condition of increased shortness of breath episodes at night, and her concern regarding conversation with Dr. Dossie Arbour at last visit about patient with  " bad kidney's and liver and concern his medical condition is declining.  Wife is eager to have blood test done to identify any new concerns.  She reports PCP office recommended ED visit for worsening of shortness of breath episodes, she states that she does not have a follow up office visit scheduled.   Wife reports patient has rested better for the last 2 nights ,he is still short of breath with walking back to bedroom and it takes a while for him to settle down and for his heart rate to come down reports up to 130's at times when walking then back down to 100.   Reviewed with wife if patient was more short of breath today, she states no, at his usual breathing, tolerating diet. When asked about weight she states he has not felt like weighing and that swelling in legs seems to be a little less but his abdomen is bloated. She reports that patient is having normal bowel movements, taking miralax. She denies need for emergency room visit today.   Discussed home visit for further assessment , wife in agreement .  Plan  Will schedule in home visit on next business day for further evaluation and contact PCP office.  Encouraged wife with importance of weighing patient to determine sudden weight gain of 3 pounds in a day or 5 in a week and how that helps with treatment plan.  Again reviewed to seek emergency follow up for worsening symptoms of shortness of breath , pain  .    [name], RN, [place] Management Coordinator  [phone]- Mobile [phone]- Toll Free Main Office

## 2018-05-17 ENCOUNTER — Other Ambulatory Visit: Payer: Self-pay | Admitting: *Deleted

## 2018-05-17 ENCOUNTER — Encounter: Payer: Self-pay | Admitting: *Deleted

## 2018-05-17 NOTE — Patient Outreach (Addendum)
[place] [facility]) Care Management  [place]   05/17/2018  Stanley Rios. 19-Jun-1956 [id number]  Subjective: Patient reports feeling pretty good on today, breathing is good on today.  Patient discussed feeling bloated especially at night after eating meal then going to bed . Reports feeling more short of breath at times especially after walking from the living room to bedroom at night, but the last few nights have been alright.  Wife discussed she has received call from Dr.Drummond nurse regarding   a sooner pulmonary appointment 10/30 ,stating appointment is not available now  , patient was on the call if sooner appointment available list,   wife declined visit reporting transportation not available, patient mother has appointment on the same day and is not available. Discussed with wife other transportation arrangements being made , she declines and wants to keep appointment for Nov. 13, she wants to be able to follow up with Dr. Dossie Arbour on this week first to determine if he will continue to follow patient for pain control .  Wife and patient states they will have to come up with a different plan, as patient will need to have pain managed. Wife discussed PCP has managed pain in the past prior to going to pain clinic and she will plan follow up once decision is made.    Objective: BP 118/68 (BP Location: Left Arm, Patient Position: Sitting, Cuff Size: Large)   Pulse 100   Resp 20   Wt 213 lb (96.6 kg)   SpO2 96%   BMI 29.71 kg/m  Patient resting comfortably in recliner chair during visit   Encounter Medications: Outpatient Encounter Medications as of 05/17/2018  Medication Sig Note  . acetaminophen (TYLENOL) 500 MG tablet Take 1,000 mg by mouth.   Marland Kitchen albuterol (PROVENTIL HFA;VENTOLIN HFA) 108 (90 Base) MCG/ACT inhaler Inhale 2 puffs into the lungs every 4 (four) hours as needed for wheezing or shortness of breath. Reported on 10/26/2015   . buPROPion (WELLBUTRIN)  75 MG tablet Take 75 mg by mouth 2 (two) times daily. Reported on 10/26/2015   . clonazePAM (KLONOPIN) 0.5 MG tablet Take 0.5 mg by mouth 3 (three) times daily as needed.    . cyclobenzaprine (FLEXERIL) 10 MG tablet Take 1 tablet (10 mg total) by mouth 3 (three) times daily as needed for muscle spasms.   . DULoxetine (CYMBALTA) 60 MG capsule Take 60 mg by mouth daily. Reported on 10/26/2015   . finasteride (PROSCAR) 5 MG tablet Take 5 mg by mouth daily.   . fluticasone (FLONASE) 50 MCG/ACT nasal spray SHAKE LIQUID AND USE 2 SPRAYS IN EACH NOSTRIL DAILY   . Fluticasone-Umeclidin-Vilant (TRELEGY ELLIPTA) 100-62.5-25 MCG/INH AEPB Inhale 1 puff into the lungs daily.   . furosemide (LASIX) 20 MG tablet Take two once or twice daily as directed , patient taking 74m once daily   . gabapentin (NEURONTIN) 800 MG tablet Take 1 tablet (800 mg total) by mouth every 8 (eight) hours.   .Marland KitchenguaiFENesin (MUCINEX) 600 MG 12 hr tablet Take 600 mg by mouth every 12 (twelve) hours. Patient taking 400 mg 3 times a day   . ipratropium-albuterol (DUONEB) 0.5-2.5 (3) MG/3ML SOLN Take 3 mLs by nebulization every 4 (four) hours as needed.   .Marland Kitchenlosartan (COZAAR) 25 MG tablet Take 25 mg by mouth daily.    . meloxicam (MOBIC) 15 MG tablet Take 1 tablet (15 mg total) by mouth daily.   . metoprolol tartrate (LOPRESSOR) 25 MG tablet Take  1 tablet (25 mg total) by mouth 2 (two) times daily.   Marland Kitchen omeprazole (PRILOSEC) 40 MG capsule Take 40 mg by mouth 2 (two) times daily.   Marland Kitchen oxyCODONE-acetaminophen (PERCOCET) 5-325 MG tablet Take 1 tablet by mouth every 8 (eight) hours as needed for up to 7 days for severe pain. Must last 7 days.   . polyethylene glycol (MIRALAX / GLYCOLAX) packet Take 17 g by mouth daily.   . predniSONE (DELTASONE) 10 MG tablet Take 1 tablet by mouth daily.    . promethazine (PHENERGAN) 25 MG tablet Take 25 mg by mouth every 6 (six) hours as needed.  09/23/2017: Has on hand if needed  . rosuvastatin (CRESTOR) 5 MG  tablet Take 5 mg by mouth daily. Taking every other day   . tamsulosin (FLOMAX) 0.4 MG CAPS capsule Take 0.4 mg by mouth daily.   . traMADol (ULTRAM) 50 MG tablet Take 1 tablet (50 mg total) by mouth every 6 (six) hours as needed.   . traZODone (DESYREL) 50 MG tablet Take 150 mg by mouth at bedtime.   . Cyanocobalamin (VITAMELTS ENERGY VITAMIN B-12) 1500 MCG TBDP Take 1 tablet by mouth daily. (Patient not taking: Reported on 05/17/2018)   . ibuprofen (ADVIL,MOTRIN) 200 MG tablet Take 400 mg by mouth.   Marland Kitchen ipratropium (ATROVENT HFA) 17 MCG/ACT inhaler Inhale 2 puffs into the lungs every 6 (six) hours.   . Vitamin D, Ergocalciferol, (DRISDOL) 50000 units CAPS capsule Take 1 capsule (50,000 Units total) by mouth 2 (two) times a week x 6 weeks. (Patient not taking: Reported on 03/04/2018)    No facility-administered encounter medications on file as of 05/17/2018.     Functional Status: In your present state of health, do you have any difficulty performing the following activities: 04/14/2018 09/17/2017  Hearing? N N  Vision? N N  Difficulty concentrating or making decisions? N Y  Walking or climbing stairs? Y Y  Comment - using walker, forgetful at time   Dressing or bathing? [name]  Comment wife helps wife assist   Doing errands, shopping? Y Y  Comment wife helps  wife/familly  assist with Therapist, occupational and eating ? N N  Comment - wife prepares  Using the Toilet? N N  In the past six months, have you accidently leaked urine? N N  Do you have problems with loss of bowel control? N N  Managing your Medications? [name]  Comment wife helps wife helps   Managing your Finances? N Y  Comment - wife assist   Housekeeping or managing your Housekeeping? Y N  Comment wife helps wife does house keeping   Some recent data might be hidden    Fall/Depression Screening: Fall Risk  05/04/2018 04/14/2018 01/25/2018  Falls in the past year? No No No  Risk for fall due to : - - -  Risk for fall  due to: Comment - - -   PHQ 2/9 Scores 05/04/2018 04/14/2018 01/25/2018 09/23/2017 08/12/2017 07/30/2017 06/09/2017  PHQ - 2 Score 0 _0 0 0 0  PHQ- 9 Score - _1 - - -      Assessment: Routine home visit   COPD- Patient reports in usual states of breathing on today, identified as being in green zone. Patient reports improve in breathing with Trelegy, using nebulizer treatment about twice daily . Discussed with patient trying space time between when  he eats his meals and lying down for sleep as he  reported bloated feeling . Has follow up visit with Dr. Wynonia Musty, Pulmonologist in next month.  Heart failure - inconsistently with weighing daily, resumed in the last 4 days. He does have some slight edema lower legs, reports decreased per wife. Wife works with patient on limiting salt in diet.  Medications - taking as prescribed, no concern concerns gets , medication delivered by [facility].  Chronic Pain - follow up visit with Dr. Lynwood Dawley on this week  to determine if he will  Continue to manage patient pain, due to noted declining in condition.  Chronic medical conditions -  Patient wants input from Dr. Wynonia Musty regarding whether his condition is declining and further input on Hospice eligible. Discussed/offeirng to call for  Follow  up with PCP , she wants to wait until after appointment with Dr.NAivera and Dr.Drummond.  Transportation - Patient has difficulty with transportation at times, patient car is not working , Metall[employer], and family members not available to assist at times. Will place [facility] social worker consult for transportation.  Advanced Directive - will scan copy into Epic record.    Plan  Will plan follow up call in the next 2 weeks.  Will send PCP this visit note.  Will place [facility] LCSW consult for transportation .  THN CM Care Plan Problem One     Most Recent Value  Care Plan Problem One  Patient with history of COPD , self care managment concerns   Role Documenting the Problem  One  Care Management Coordinator  Care Plan for Problem One  Active  THN Long Term Goal   Patient will be able to report increased knowledge of managment of COPD over the next 60 days   THN Long Term Goal Start Date  04/14/18  [facility] CM Short Term Goal #1   Patient will be able to report improved breathing with activity over the next 30 days   THN CM Short Term Goal #1 Start Date  05/17/18  Interventions for Short Term Goal #1  Discussed with patient, increasing oxygen to 4 liters in preparation to walking to bedroom from living room, suggestion of use of albutero to pretreat priort to activity, also discussed spacing time between eating evening meal on lying down to prevent bloating , discussed how eating and digesting foods  uses energy   THN CM Short Term Goal #2   Patient will be able to report walking at least 2 laps in home daily over the next 30 days   THN CM Short Term Goal #2 Start Date  04/14/18    [facility] CM Care Plan Problem Two     Most Recent Value  Care Plan Problem Two  Knowledge related to Heart failure self care managment   Role Documenting the Problem Two  Care Management [place] for Problem Two  Active  Interventions for Problem Two Long Term Goal   Home visit, reviewed with teachback , worsening symptoms of heart failure and action plan   [facility] Long Term Goal  Patient will be able to report increase knowledge of Heart failure managment  over the next 60 days   THN Long Term Goal Start Date  04/14/18  [facility] CM Short Term Goal #1   Patient will be able to reports weighing daily and keeping a record over the next 30 days   THN CM Short Term Goal #1 Start Date  05/17/18 Barrie Folk not met, restated ]  Interventions for Short Term Goal #2   Encouraged  regarding benefit of monitoring daily weights to identify sudden changes in weight gain, reviewed best time of day to weight, and keeping a recordl   THN CM Short Term Goal #2   Patient will report checking blood pressure at least 5  days a week and keeping a record over the next 30 days   THN CM Short Term Goal #2 Start Date  05/17/18  Interventions for Short Term Goal #2  Reinforced importance on resuming practice of monitoring blood pressure on regular basis to identify abnormal, reviewed normal ranges       [name], RN, [place] Management Coordinator  [phone]- Mobile [phone]- [place]

## 2018-05-18 ENCOUNTER — Ambulatory Visit: Payer: PPO | Admitting: Pain Medicine

## 2018-05-18 NOTE — Progress Notes (Signed)
Patient's Name: Stanley Rios.  MRN: [id number]  Referring Provider: Marygrace Drought, MD  DOB: 03-May-1956  PCP: Marygrace Drought, MD  DOS: 05/19/2018  Note by: Gaspar Cola, MD  Service setting: Ambulatory outpatient  Specialty: Interventional Pain Management  Location: [facility] (AMB) Pain Management Facility    Patient type: Established   Primary Reason(s) for Visit: Encounter for post-procedure evaluation of chronic illness with mild to moderate exacerbation CC: Leg Pain (left)  HPI  Mr. Fitzgerald is a 62 y.o. year old, male patient, who comes today for a post-procedure evaluation. He has Opiate withdrawal (HCC); COPD (chronic obstructive pulmonary disease) ([place]); HTN (hypertension); Depression; Anxiety; Neutrophilic leukocytosis; DDD (degenerative disc disease), lumbar; Lumbar facet syndrome (Bilateral) (R>L); Sacroiliac joint pain (Bilateral) (R>L); DDD (degenerative disc disease), cervical; Cervical facet syndrome; Pressure injury of skin; Chronic airway obstruction ([place]); Chronic pain syndrome; Depressive disorder; Dry eye syndrome; Esophageal reflux; Hypertension; Insomnia; Irritable bowel syndrome (IBS); Chronic low back pain (Primary Source of Pain) (Bilateral) (R>L); Posterior subcapsular cataract, bilateral; Cervical post-laminectomy syndrome; Pulmonary emphysema ([place]); Seborrheic keratoses; Spinal stenosis in cervical region; Testosterone deficiency; Tobacco use disorder; Umbilical hernia; Vitamin D deficiency; Vitreomacular adhesion of right eye; Long term current use of opiate analgesic; Opiate use; Long term prescription opiate use; Chronic hip pain; Chronic shoulder pain (Tertiary source of pain) (Bilateral) (R>L); Lower extremity weakness; Chronic neck pain (Secondary source of pain) (Bilateral) (R>L); Chronic lower extremity pain (Bilateral) (R>L); B12 deficiency; Osteoarthritis; Neurogenic pain; Musculoskeletal pain; Diaphragmatic hernia; Chronic hypoxemic respiratory failure ([place]);  Chronic narcotic use; Intercostal neuropathic pain (Left); Post-thoracotomy pain syndrome (Left); Intercostal neuralgia (Left); Claudication ([place]); Chronic chest wall pain (Left); Nodule of right lung; Fatigue; Localized edema; Former tobacco use; Foreign body in respiratory system; Tremor; Spondylosis without myelopathy or radiculopathy, lumbar region; Lumbar facet arthropathy (Bilateral); and Lumbar Grade 1 Retrolisthesis of L4 on L5 on their problem list. His primarily concern today is the Leg Pain (left)  Pain Assessment: Location: Left Leg Radiating: pain in calf radiaties down to foot  Onset: More than a month ago Duration: Chronic pain Quality: Tightness, Aching, Constant Severity: 4 /10 (subjective, self-reported pain score)  Note: Reported level is inconsistent with clinical observations. Clinically the patient looks like a 2/10 A 2/10 is viewed as "Mild to Moderate" and described as noticeable and distracting. Impossible to hide from other people. More frequent flare-ups. Still possible to adapt and function close to normal. It can be very annoying and may have occasional stronger flare-ups. With discipline, patients may get used to it and adapt. Mr. Wilkie continues to use a standard subjective pain scale, rather than an objective pain scale as instructed. When using our objective Pain Scale, levels between 6 and 10/10 are said to belong in an emergency room, as it progressively worsens from a 6/10, described as severely limiting, requiring emergency care not usually available at an outpatient pain management facility. At a 6/10 level, communication becomes difficult and requires great effort. Assistance to reach the emergency department may be required. Facial flushing and profuse sweating along with potentially dangerous increases in heart rate and blood pressure will be evident. Effect on ADL: limits my Daily activites Timing: Constant Modifying factors: medications, sit down  BP: 121/73   HR: (!) 104  Mr. Gabler comes in today for post-procedure evaluation.  The patient indicates having attained 100% relief of the pain in the lower back, however, he is still having pain going down the left leg.  It has not been 6 weeks  since the radiofrequency and therefore I do not expect him to have full benefit at this point.  However, I would hope that he does continue to have some improvement in that leg pain.  Unfortunately, due to the patient's other medical conditions, he represents a very high risk for any further interventional therapies and therefore we will try to avoid those as much as possible.  If his medical condition improved significantly, then we will reconsider.  Further details on both, my assessment(s), as well as the proposed treatment plan, please see below.  Post-Procedure Assessment  05/04/2018 Procedure: Therapeutic left-sided lumbar facet RFA #2 under fluoroscopic guidance and IV sedation Pre-procedure pain score:  4/10 Post-procedure pain score: 0/10 (100% relief) Influential Factors: BMI: 32.22 kg/m Intra-procedural challenges: None observed.         Assessment challenges: None detected.              Reported side-effects: None.        Post-procedural adverse reactions or complications: None reported         Sedation: Please see nurses note. When no sedatives are used, the analgesic levels obtained are directly associated to the effectiveness of the local anesthetics. However, when sedation is provided, the level of analgesia obtained during the initial 1 hour following the intervention, is believed to be the result of a combination of factors. These factors may include, but are not limited to: 1. The effectiveness of the local anesthetics used. 2. The effects of the analgesic(s) and/or anxiolytic(s) used. 3. The degree of discomfort experienced by the patient at the time of the procedure. 4. The patients ability and reliability in recalling and recording the  events. 5. The presence and influence of possible secondary gains and/or psychosocial factors. Reported result: Relief experienced during the 1st hour after the procedure: 100 % (Ultra-Short Term Relief)            Interpretative annotation: Clinically appropriate result. Analgesia during this period is likely to be Local Anesthetic and/or IV Sedative (Analgesic/Anxiolytic) related.          Effects of local anesthetic: The analgesic effects attained during this period are directly associated to the localized infiltration of local anesthetics and therefore cary significant diagnostic value as to the etiological location, or anatomical origin, of the pain. Expected duration of relief is directly dependent on the pharmacodynamics of the local anesthetic used. Long-acting (4-6 hours) anesthetics used.  Reported result: Relief during the next 4 to 6 hour after the procedure: 100 % (Short-Term Relief)            Interpretative annotation: Clinically appropriate result. Analgesia during this period is likely to be Local Anesthetic-related.          Long-term benefit: Defined as the period of time past the expected duration of local anesthetics (1 hour for short-acting and 4-6 hours for long-acting). With the possible exception of prolonged sympathetic blockade from the local anesthetics, benefits during this period are typically attributed to, or associated with, other factors such as analgesic sensory neuropraxia, antiinflammatory effects, or beneficial biochemical changes provided by agents other than the local anesthetics.  Reported result: Extended relief following procedure: 100 % (Long-Term Relief)            Interpretative annotation: Clinically possible results. Good relief. No permanent benefit expected. Inflammation plays a part in the etiology to the pain. Benefit could signal adequate RF ablation.  Current benefits: Defined as reported results that persistent at this point in time.  Analgesia:  100 % Mr. Eatherly reports improvement of axial symptoms. Function: Somewhat improved ROM: Somewhat improved Interpretative annotation: Ongoing benefit. Therapeutic success. Effective therapeutic approach. Benefit could signal adequate RF ablation.  Interpretation: Results would suggest a successful therapeutic intervention.                  Plan:  At this point, to to the patient's medical condition, he represents a very high risk for any further interventional therapies.                Laboratory Chemistry  Inflammation Markers (CRP: Acute Phase) (ESR: Chronic Phase) Lab Results  Component Value Date   CRP <0.8 08/07/2016   ESRSEDRATE 24 (H) 08/07/2016   LATICACIDVEN 0.9 04/06/2016                         Rheumatology Markers No results found for: RF, ANA, LABURIC, URICUR, LYMEIGGIGMAB, LYMEABIGMQN, HLAB27                      Renal Markers Lab Results  Component Value Date   BUN 12 08/07/2016   CREATININE 0.90 08/07/2016   GFRAA >60 08/07/2016   GFRNONAA >60 08/07/2016                             Hepatic Markers Lab Results  Component Value Date   AST 20 08/07/2016   ALT 16 (L) 08/07/2016   ALBUMIN 3.6 08/07/2016                        Neuropathy Markers Lab Results  Component Value Date   VITAMINB12 176 (L) 08/07/2016   HIV NON REACTIVE 04/10/2016                        Hematology Parameters Lab Results  Component Value Date   INR 0.83 04/11/2016   LABPROT 11.4 04/11/2016   PLT 318 04/18/2016   HGB 12.5 (L) 04/18/2016   HCT 36.8 (L) 04/18/2016                        CV Markers Lab Results  Component Value Date   TROPONINI <0.03 04/07/2016                         Note: Lab results reviewed.  Recent Imaging Results   Results for orders placed in visit on 05/04/18  DG C-Arm 1-60 Min-No Report   Narrative Fluoroscopy was utilized by the requesting physician.  No radiographic  interpretation.    Interpretation Report: Fluoroscopy was used during the  procedure to assist with needle guidance. The images were interpreted intraoperatively by the requesting physician.  Meds   Current Outpatient Medications:  .  acetaminophen (TYLENOL) 500 MG tablet, Take 1,000 mg by mouth., Disp: , Rfl:  .  albuterol (PROVENTIL HFA;VENTOLIN HFA) 108 (90 Base) MCG/ACT inhaler, Inhale 2 puffs into the lungs every 4 (four) hours as needed for wheezing or shortness of breath. Reported on 10/26/2015, Disp: , Rfl:  .  buPROPion (WELLBUTRIN) 75 MG tablet, Take 75 mg by mouth 2 (two) times daily. Reported on 10/26/2015, Disp: , Rfl:  .  clonazePAM (KLONOPIN) 0.5 MG tablet, Take 0.5 mg by mouth 3 (three) times daily as needed. , Disp: , Rfl:  .  cyclobenzaprine (FLEXERIL) 10 MG tablet, Take 1 tablet (10 mg total) by mouth 3 (three) times daily as needed for muscle spasms., Disp: 540 tablet, Rfl: 0 .  DULoxetine (CYMBALTA) 60 MG capsule, Take 60 mg by mouth daily. Reported on 10/26/2015, Disp: , Rfl:  .  finasteride (PROSCAR) 5 MG tablet, Take 5 mg by mouth daily., Disp: , Rfl:  .  fluticasone (FLONASE) 50 MCG/ACT nasal spray, SHAKE LIQUID AND USE 2 SPRAYS IN EACH NOSTRIL DAILY, Disp: , Rfl:  .  Fluticasone-Umeclidin-Vilant (TRELEGY ELLIPTA) 100-62.5-25 MCG/INH AEPB, Inhale 1 puff into the lungs daily., Disp: , Rfl:  .  furosemide (LASIX) 20 MG tablet, Take two once or twice daily as directed , patient taking 51m once daily, Disp: , Rfl:  .  gabapentin (NEURONTIN) 800 MG tablet, Take 1 tablet (800 mg total) by mouth every 8 (eight) hours., Disp: 90 tablet, Rfl: 5 .  guaiFENesin (MUCINEX) 600 MG 12 hr tablet, Take 600 mg by mouth every 12 (twelve) hours. Patient taking 400 mg 3 times a day, Disp: , Rfl:  .  ibuprofen (ADVIL,MOTRIN) 200 MG tablet, Take 400 mg by mouth., Disp: , Rfl:  .  ipratropium (ATROVENT HFA) 17 MCG/ACT inhaler, Inhale 2 puffs into the lungs every 6 (six) hours., Disp: , Rfl:  .  ipratropium-albuterol (DUONEB) 0.5-2.5 (3) MG/3ML SOLN, Take 3 mLs by  nebulization every 4 (four) hours as needed., Disp: , Rfl:  .  losartan (COZAAR) 25 MG tablet, Take 25 mg by mouth daily. , Disp: , Rfl:  .  meloxicam (MOBIC) 15 MG tablet, Take 1 tablet (15 mg total) by mouth daily., Disp: 90 tablet, Rfl: 0 .  metoprolol tartrate (LOPRESSOR) 25 MG tablet, Take 1 tablet (25 mg total) by mouth 2 (two) times daily., Disp: 60 tablet, Rfl: 0 .  omeprazole (PRILOSEC) 40 MG capsule, Take 40 mg by mouth 2 (two) times daily., Disp: , Rfl:  .  polyethylene glycol (MIRALAX / GLYCOLAX) packet, Take 17 g by mouth daily., Disp: , Rfl:  .  predniSONE (DELTASONE) 10 MG tablet, Take 1 tablet by mouth daily. , Disp: , Rfl:  .  promethazine (PHENERGAN) 25 MG tablet, Take 25 mg by mouth every 6 (six) hours as needed. , Disp: , Rfl:  .  rosuvastatin (CRESTOR) 5 MG tablet, Take 5 mg by mouth daily. Taking every other day, Disp: , Rfl:  .  tamsulosin (FLOMAX) 0.4 MG CAPS capsule, Take 0.4 mg by mouth daily., Disp: , Rfl:  .  traMADol (ULTRAM) 50 MG tablet, Take 1 tablet (50 mg total) by mouth every 6 (six) hours as needed., Disp: 120 tablet, Rfl: 5 .  traZODone (DESYREL) 50 MG tablet, Take 150 mg by mouth at bedtime., Disp: , Rfl:  .  Vitamin D, Ergocalciferol, (DRISDOL) 50000 units CAPS capsule, Take 1 capsule (50,000 Units total) by mouth 2 (two) times a week x 6 weeks., Disp: 12 capsule, Rfl: 0 .  Cyanocobalamin (VITAMELTS ENERGY VITAMIN B-12) 1500 MCG TBDP, Take 1 tablet by mouth daily. (Patient not taking: Reported on 05/17/2018), Disp: 180 tablet, Rfl: 0 .  lidocaine (XYLOCAINE) 4 % external solution, Apply topically 3 (three) times daily as needed., Disp: 50 mL, Rfl: PRN  ROS  Constitutional: Denies any fever or chills Gastrointestinal: No reported hemesis, hematochezia, vomiting, or acute GI distress Musculoskeletal: Denies any acute onset joint swelling, redness, loss of ROM, or weakness Neurological: No reported episodes of acute onset apraxia, aphasia, dysarthria, agnosia,  amnesia, paralysis, loss of coordination,  or loss of consciousness  Allergies  Mr. Rando has No Known Allergies.  [employer]  Drug: Mr. Veals  reports that he has current or past drug history. Alcohol:  reports that he does not drink alcohol. Tobacco:  reports that he quit smoking about 1 years ago. His smoking use included cigarettes. He smoked 0.00 packs per day for 40.00 years. He has quit using smokeless tobacco. Medical:  has a past medical history of Anxiety, Arthritis, Chronic back pain, Chronic neck pain, COPD (chronic obstructive pulmonary disease) ([place]), Depression, Hyperlipidemia, Hypertension, Opiate abuse, continuous ([place]), and Pneumonia. Surgical: Mr. Julia  has a past surgical history that includes Cholecystectomy; Spinal fusion; Neck surgery (2002 approx); Hernia repair; and Thoracotomy , removal of mesh from prevous hernia repair  (Left). Family: family history includes Bladder Cancer in his father; Diabetes in his paternal uncle.  Constitutional Exam  General appearance: Well nourished, well developed, and well hydrated. In no apparent acute distress Vitals:   05/19/18 1336  BP: 121/73  Pulse: (!) 104  Temp: (!) 97 F (36.1 C)  SpO2: 94%  Weight: 231 lb (104.8 kg)  Height: 5' 11"  (1.803 m)   BMI Assessment: Estimated body mass index is 32.22 kg/m as calculated from the following:   Height as of this encounter: 5' 11"  (1.803 m).   Weight as of this encounter: 231 lb (104.8 kg).  BMI interpretation table: BMI level Category Range association with higher incidence of chronic pain  <18 kg/m2 Underweight   18.5-24.9 kg/m2 Ideal body weight   25-29.9 kg/m2 Overweight Increased incidence by 20%  30-34.9 kg/m2 Obese (Class I) Increased incidence by 68%  35-39.9 kg/m2 Severe obesity (Class II) Increased incidence by 136%  >40 kg/m2 Extreme obesity (Class III) Increased incidence by 254%   Patient's current BMI Ideal Body weight  Body mass index is 32.22 kg/m. Ideal body  weight: 75.3 kg (166 lb 0.1 oz) Adjusted ideal body weight: 87.1 kg (192 lb 0.1 oz)   BMI Readings from Last 4 Encounters:  05/19/18 32.22 kg/m  05/17/18 29.71 kg/m  05/04/18 31.80 kg/m  04/14/18 31.38 kg/m   Wt Readings from Last 4 Encounters:  05/19/18 231 lb (104.8 kg)  05/17/18 213 lb (96.6 kg)  05/04/18 228 lb (103.4 kg)  04/14/18 225 lb (102.1 kg)  Psych/Mental status: Alert, oriented x 3 (person, place, & time)       Eyes: PERLA Respiratory: Oxygen-dependent COPD  Cervical Spine Area Exam  Skin & Axial Inspection: No masses, redness, edema, swelling, or associated skin lesions Alignment: Symmetrical Functional ROM: Restricted ROM      Stability: No instability detected Muscle Tone/Strength: Functionally intact. No obvious neuro-muscular anomalies detected. Sensory (Neurological): Unimpaired Palpation: No palpable anomalies              Upper Extremity (UE) Exam    Side: Right upper extremity  Side: Left upper extremity  Skin & Extremity Inspection: Skin color, temperature, and hair growth are WNL. No peripheral edema or cyanosis. No masses, redness, swelling, asymmetry, or associated skin lesions. No contractures.  Skin & Extremity Inspection: Skin color, temperature, and hair growth are WNL. No peripheral edema or cyanosis. No masses, redness, swelling, asymmetry, or associated skin lesions. No contractures.  Functional ROM: Diminished ROM          Functional ROM: Diminished ROM          Muscle Tone/Strength: Functionally intact. No obvious neuro-muscular anomalies detected.  Muscle Tone/Strength: Functionally intact. No obvious neuro-muscular anomalies detected.  Sensory (Neurological): Unimpaired          Sensory (Neurological): Unimpaired          Palpation: No palpable anomalies              Palpation: No palpable anomalies              Provocative Test(s):  Phalen's test: deferred Tinel's test: deferred Apley's scratch test (touch opposite shoulder):  Action 1  (Across chest): deferred Action 2 (Overhead): deferred Action 3 (LB reach): deferred   Provocative Test(s):  Phalen's test: deferred Tinel's test: deferred Apley's scratch test (touch opposite shoulder):  Action 1 (Across chest): deferred Action 2 (Overhead): deferred Action 3 (LB reach): deferred    Thoracic Spine Area Exam  Skin & Axial Inspection: No masses, redness, or swelling Alignment: Symmetrical Functional ROM: Diminished ROM Stability: No instability detected Muscle Tone/Strength: Functionally intact. No obvious neuro-muscular anomalies detected. Sensory (Neurological): Unimpaired Muscle strength & Tone: No palpable anomalies  Lumbar Spine Area Exam  Skin & Axial Inspection: No masses, redness, or swelling Alignment: Symmetrical Functional ROM: Improved after treatment       Stability: No instability detected Muscle Tone/Strength: Functionally intact. No obvious neuro-muscular anomalies detected. Sensory (Neurological): Unimpaired Palpation: No palpable anomalies       Provocative Tests: Hyperextension/rotation test: deferred today       Lumbar quadrant test (Kemp's test): deferred today       Lateral bending test: deferred today       Patrick's Maneuver: deferred today                   FABER test: deferred today                   S-I anterior distraction/compression test: deferred today         S-I lateral compression test: deferred today         S-I Thigh-thrust test: deferred today         S-I Gaenslen's test: deferred today          Gait & Posture Assessment  Ambulation: Patient came in today in a wheel chair Gait: Significantly limited. Dependent on assistive device to ambulate Posture: Antalgic   Lower Extremity Exam    Side: Right lower extremity  Side: Left lower extremity  Stability: No instability observed          Stability: No instability observed          Skin & Extremity Inspection: Skin color, temperature, and hair growth are WNL. No  peripheral edema or cyanosis. No masses, redness, swelling, asymmetry, or associated skin lesions. No contractures.  Skin & Extremity Inspection: Skin color, temperature, and hair growth are WNL. No peripheral edema or cyanosis. No masses, redness, swelling, asymmetry, or associated skin lesions. No contractures.  Functional ROM: Diminished ROM                  Functional ROM: Diminished ROM                  Muscle Tone/Strength: Functionally intact. No obvious neuro-muscular anomalies detected.  Muscle Tone/Strength: Functionally intact. No obvious neuro-muscular anomalies detected.  Sensory (Neurological): Unimpaired  Sensory (Neurological): Unimpaired  Palpation: No palpable anomalies  Palpation: No palpable anomalies   Assessment  Primary Diagnosis & Pertinent Problem List: The primary encounter diagnosis was Chronic low back pain (Primary Source of Pain) (Bilateral) (R>L). Diagnoses of Chronic neck pain (Secondary source of pain) (Bilateral) (R>L), Chronic  shoulder pain [facility] source of pain) (Bilateral) (R>L), and Chronic pain syndrome were also pertinent to this visit.  Status Diagnosis  Resolved Controlled Controlled 1. Chronic low back pain (Primary Source of Pain) (Bilateral) (R>L)   2. Chronic neck pain (Secondary source of pain) (Bilateral) (R>L)   3. Chronic shoulder pain (Tertiary source of pain) (Bilateral) (R>L)   4. Chronic pain syndrome     Problems updated and reviewed during this visit: No problems updated. Plan of Care  Pharmacotherapy (Medications Ordered): Meds ordered this encounter  Medications  . lidocaine (XYLOCAINE) 4 % external solution    Sig: Apply topically 3 (three) times daily as needed.    Dispense:  50 mL    Refill:  PRN   Medications administered today: Jalien C. Sheppard Coil. had no medications administered during this visit.  Procedure Orders    No procedure(s) ordered today   Lab Orders  No laboratory test(s) ordered today   Imaging  Orders  No imaging studies ordered today   Referral Orders  No referral(s) requested today   Interventional management options: Planned, scheduled, and/or pending:   NO MORE PROCEDURES!!  The patient's general medical health has deteriorated to the point where the risks outweigh the benefits of any type of interventional therapies.   Considering:   None at this point.   Palliative PRN treatment(s):   None at this point.   Provider-requested follow-up: Return for Med-Mgmt, w/ Dionisio David, NP.  Future Appointments  Date Time Provider [place]  07/12/2018  1:30 PM Vevelyn Francois, NP [facility] None   Primary Care Physician: Marygrace Drought, MD Location: [facility] Outpatient Pain Management Facility Note by: Gaspar Cola, MD Date: 05/19/2018; Time: 4:08 PM

## 2018-05-19 ENCOUNTER — Ambulatory Visit: Payer: PPO | Attending: Pain Medicine | Admitting: Pain Medicine

## 2018-05-19 ENCOUNTER — Other Ambulatory Visit: Payer: Self-pay

## 2018-05-19 ENCOUNTER — Encounter: Payer: Self-pay | Admitting: Pain Medicine

## 2018-05-19 VITALS — BP 121/73 | HR 104 | Temp 97.0°F | Ht 71.0 in | Wt 231.0 lb

## 2018-05-19 DIAGNOSIS — G8929 Other chronic pain: Secondary | ICD-10-CM

## 2018-05-19 DIAGNOSIS — M4802 Spinal stenosis, cervical region: Secondary | ICD-10-CM | POA: Diagnosis not present

## 2018-05-19 DIAGNOSIS — M5136 Other intervertebral disc degeneration, lumbar region: Secondary | ICD-10-CM | POA: Insufficient documentation

## 2018-05-19 DIAGNOSIS — M961 Postlaminectomy syndrome, not elsewhere classified: Secondary | ICD-10-CM | POA: Diagnosis not present

## 2018-05-19 DIAGNOSIS — J449 Chronic obstructive pulmonary disease, unspecified: Secondary | ICD-10-CM | POA: Diagnosis not present

## 2018-05-19 DIAGNOSIS — E785 Hyperlipidemia, unspecified: Secondary | ICD-10-CM | POA: Diagnosis not present

## 2018-05-19 DIAGNOSIS — M79605 Pain in left leg: Secondary | ICD-10-CM | POA: Diagnosis present

## 2018-05-19 DIAGNOSIS — M5030 Other cervical disc degeneration, unspecified cervical region: Secondary | ICD-10-CM | POA: Insufficient documentation

## 2018-05-19 DIAGNOSIS — Z79899 Other long term (current) drug therapy: Secondary | ICD-10-CM | POA: Diagnosis not present

## 2018-05-19 DIAGNOSIS — Z7952 Long term (current) use of systemic steroids: Secondary | ICD-10-CM | POA: Insufficient documentation

## 2018-05-19 DIAGNOSIS — I739 Peripheral vascular disease, unspecified: Secondary | ICD-10-CM | POA: Insufficient documentation

## 2018-05-19 DIAGNOSIS — F329 Major depressive disorder, single episode, unspecified: Secondary | ICD-10-CM | POA: Insufficient documentation

## 2018-05-19 DIAGNOSIS — Z8052 Family history of malignant neoplasm of bladder: Secondary | ICD-10-CM | POA: Diagnosis not present

## 2018-05-19 DIAGNOSIS — E559 Vitamin D deficiency, unspecified: Secondary | ICD-10-CM | POA: Insufficient documentation

## 2018-05-19 DIAGNOSIS — Z79891 Long term (current) use of opiate analgesic: Secondary | ICD-10-CM | POA: Insufficient documentation

## 2018-05-19 DIAGNOSIS — M25511 Pain in right shoulder: Secondary | ICD-10-CM | POA: Diagnosis not present

## 2018-05-19 DIAGNOSIS — I10 Essential (primary) hypertension: Secondary | ICD-10-CM | POA: Insufficient documentation

## 2018-05-19 DIAGNOSIS — Z87891 Personal history of nicotine dependence: Secondary | ICD-10-CM | POA: Insufficient documentation

## 2018-05-19 DIAGNOSIS — Z791 Long term (current) use of non-steroidal anti-inflammatories (NSAID): Secondary | ICD-10-CM | POA: Diagnosis not present

## 2018-05-19 DIAGNOSIS — G4700 Insomnia, unspecified: Secondary | ICD-10-CM | POA: Insufficient documentation

## 2018-05-19 DIAGNOSIS — Z7951 Long term (current) use of inhaled steroids: Secondary | ICD-10-CM | POA: Insufficient documentation

## 2018-05-19 DIAGNOSIS — M545 Low back pain: Secondary | ICD-10-CM | POA: Insufficient documentation

## 2018-05-19 DIAGNOSIS — Z9049 Acquired absence of other specified parts of digestive tract: Secondary | ICD-10-CM | POA: Diagnosis not present

## 2018-05-19 DIAGNOSIS — Z9981 Dependence on supplemental oxygen: Secondary | ICD-10-CM | POA: Insufficient documentation

## 2018-05-19 DIAGNOSIS — M25512 Pain in left shoulder: Secondary | ICD-10-CM | POA: Diagnosis not present

## 2018-05-19 DIAGNOSIS — M542 Cervicalgia: Secondary | ICD-10-CM

## 2018-05-19 DIAGNOSIS — M533 Sacrococcygeal disorders, not elsewhere classified: Secondary | ICD-10-CM | POA: Insufficient documentation

## 2018-05-19 DIAGNOSIS — K219 Gastro-esophageal reflux disease without esophagitis: Secondary | ICD-10-CM | POA: Insufficient documentation

## 2018-05-19 DIAGNOSIS — E538 Deficiency of other specified B group vitamins: Secondary | ICD-10-CM | POA: Insufficient documentation

## 2018-05-19 DIAGNOSIS — F419 Anxiety disorder, unspecified: Secondary | ICD-10-CM | POA: Diagnosis not present

## 2018-05-19 DIAGNOSIS — G894 Chronic pain syndrome: Secondary | ICD-10-CM

## 2018-05-19 DIAGNOSIS — K589 Irritable bowel syndrome without diarrhea: Secondary | ICD-10-CM | POA: Insufficient documentation

## 2018-05-19 MED ORDER — LIDOCAINE HCL 4 % EX SOLN: Freq: Three times a day (TID) | CUTANEOUS | 99 refills | Status: AC | PRN

## 2018-05-21 ENCOUNTER — Other Ambulatory Visit: Payer: Self-pay

## 2018-05-21 NOTE — Patient Outreach (Signed)
[facility]) Care Management  05/21/2018  Stanley Rios. 1956/01/03 [id number]  Initial outreach attempt to the patient on today's date. BSW spoke with the patients wife, Stanley Rios who stated the patient was lying down and unavailable. Stanley Rios denied any immediate transportation needs in the next week. BSW to contact the patient on Thursday 10/31.  [name], BSW, CDP [facility] [phone]

## 2018-05-27 ENCOUNTER — Other Ambulatory Visit: Payer: Self-pay

## 2018-05-27 NOTE — Patient Outreach (Signed)
[facility]) Care Management  05/27/2018  Stanley Rios. 03/31/1956 [id number]  Successful outreach to the patients spouse, Stanley Rios on today's date who was able to provide patients HIPAA identifiers. The patient was laying down at the time of today's call. BSW spoke with Stanley Rios about transportation needs to the patients [place] pulmonology appointment in [place]. BSW contacted [name] requesting transportation for the patient.  BSW spoke with Stanley Rios about community resources available in [place] for [name] specific appointments. Stanley Rios receptive to information. Stanley Rios stated she has a friend who is normally able to assist within the county pending advance notice. Stanley Rios is willing to apply for transportation resources the patient may qualify for.  Plan: BSW to outreach Stanley Rios to confirm pick up time for upcomming pulmonology appointment. BSW to review service area for [employer] ADA services and discuss during the next call.  [name], BSW, CDP [facility] [phone]

## 2018-05-31 ENCOUNTER — Other Ambulatory Visit: Payer: Self-pay

## 2018-05-31 NOTE — Patient Outreach (Signed)
[facility] [facility]) Care Management  05/31/2018  Marshawn Normoyle. 1956/05/10 [id number]  Successful outreach to the patients wife, Quaran Kedzierski on today's date. HIPAA identifiers confirmed. BSW informed Mrs. Earnhardt the patient will be picked up on 11/13 at 11:30 am for his pulmonology appointment. The driver is to wait for the patient in the parking lot for return ride home. Mrs. Degrace stated understanding.  BSW informed Mrs. Cooksey the patient unfortunately lives out of the [employer] area. However, the patient does live within the [employer] [place] service area. BSW contacted [employer] prior to today's call to confirm patients eligibility for the program. The patient qualifies for the "dial-a-ride" program to be used for medical appointments within the county. BSW informed Mrs. Tappen she would receive a packet in the mail for completion. The patient would be charged $3.00 per one-way trip totaling $6.00 per trip. The patients spouse would be eligible to ride as patient's attendant at no charge. Mrs. Hoe thankful for this resource and plans to follow up with completion of application once received.  During today's call it is identified the patient will have a cancer center follow up in December and would need transportation assistance. BSW to contact Mrs. Tuzzolino in the next week for appointment details in order to assist with arranging needed transportation services.  [name], BSW, CDP [facility] [phone]

## 2018-06-04 ENCOUNTER — Other Ambulatory Visit: Payer: Self-pay | Admitting: *Deleted

## 2018-06-04 NOTE — Patient Outreach (Signed)
[facility]) Care Management  06/04/2018  Stanley Rios. 1956-01-15 [id number]   Telephone assessment    Unsuccessful telephone outreach call to patient , no answer, able to leave a HIPAA compliant message for return call.   Plan  Will await return call, if no response will plan return call in the next 4 business days, patient has appointment in [facility] on 11/13.     [name], RN, [place] Management Coordinator  [phone]- Mobile [phone]- Toll Free Main Office

## 2018-06-09 ENCOUNTER — Ambulatory Visit: Payer: Self-pay

## 2018-06-09 DIAGNOSIS — E785 Hyperlipidemia, unspecified: Secondary | ICD-10-CM | POA: Diagnosis not present

## 2018-06-09 DIAGNOSIS — F329 Major depressive disorder, single episode, unspecified: Secondary | ICD-10-CM | POA: Diagnosis not present

## 2018-06-09 DIAGNOSIS — Z79899 Other long term (current) drug therapy: Secondary | ICD-10-CM | POA: Diagnosis not present

## 2018-06-09 DIAGNOSIS — Z7952 Long term (current) use of systemic steroids: Secondary | ICD-10-CM | POA: Diagnosis not present

## 2018-06-09 DIAGNOSIS — J449 Chronic obstructive pulmonary disease, unspecified: Secondary | ICD-10-CM | POA: Diagnosis not present

## 2018-06-09 DIAGNOSIS — R600 Localized edema: Secondary | ICD-10-CM | POA: Diagnosis not present

## 2018-06-09 DIAGNOSIS — Z7951 Long term (current) use of inhaled steroids: Secondary | ICD-10-CM | POA: Diagnosis not present

## 2018-06-09 DIAGNOSIS — Z87891 Personal history of nicotine dependence: Secondary | ICD-10-CM | POA: Diagnosis not present

## 2018-06-09 DIAGNOSIS — I10 Essential (primary) hypertension: Secondary | ICD-10-CM | POA: Diagnosis not present

## 2018-06-09 DIAGNOSIS — J439 Emphysema, unspecified: Secondary | ICD-10-CM | POA: Diagnosis not present

## 2018-06-09 DIAGNOSIS — R0602 Shortness of breath: Secondary | ICD-10-CM | POA: Diagnosis not present

## 2018-06-09 DIAGNOSIS — Z9981 Dependence on supplemental oxygen: Secondary | ICD-10-CM | POA: Diagnosis not present

## 2018-06-09 DIAGNOSIS — F419 Anxiety disorder, unspecified: Secondary | ICD-10-CM | POA: Diagnosis not present

## 2018-06-10 ENCOUNTER — Other Ambulatory Visit: Payer: Self-pay | Admitting: *Deleted
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
## 2018-06-14 ENCOUNTER — Other Ambulatory Visit: Payer: Self-pay | Admitting: *Deleted

## 2018-06-14 NOTE — Patient Outreach (Signed)
[place] [facility]) Care Management  06/14/2018  Wendall Isabell. [birth year] [id number]   Telephone follow up call   Successful outreach call to patient wife Eliah Marquard to follow up on new oxygen orders. She discussed that patient is currently wearing oxygen by inogen concentrator .   Wife discussed that she has been in contact with [facility] and they are waiting , on walking oxygen saturations information .  She has also contacted Dr.Drummond office regarding sending information from visit on 11/13  to [facility] . I offered assistance with following up with Dr.Drummond office again she said plans to call them again in the am.  Wife again states patient has oxygen for use via the inogen/concentrator, she will be glad when patient gets back on the regular oxygen system as she feels that works best . Wife discussed that she still owns an amount at [employer] that she will have to pay from previous contract.    Plan  Will plan care coordination call to [facility] regarding receiving appropriate documentation for new oxygen.  Will plan follow up call in the next 2 weeks to patient .    [name], RN, [place] Management Coordinator  [phone]- Mobile [phone]- Toll Free Main Office

## 2018-06-15 ENCOUNTER — Other Ambulatory Visit: Payer: Self-pay | Admitting: *Deleted

## 2018-06-15 ENCOUNTER — Other Ambulatory Visit: Payer: Self-pay

## 2018-06-15 NOTE — Patient Outreach (Addendum)
[facility]) Care Management  06/15/2018  Stanley Rios. 03/27/1956 [id number]   Care Coordination call  Placed call to [facility] , to follow up on patient new order for home oxygen ,whether they have received needed information from [facility] pulmonary office. . Provided my contact number to representative to follow up on .  Plan  Will follow up call to patient within next week.   [name], RN, [place] Management Coordinator  [phone]- Mobile [phone]- Toll Free Main Office
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
## 2018-06-17 DIAGNOSIS — K458 Other specified abdominal hernia without obstruction or gangrene: Secondary | ICD-10-CM | POA: Diagnosis not present

## 2018-06-17 DIAGNOSIS — J189 Pneumonia, unspecified organism: Secondary | ICD-10-CM | POA: Diagnosis not present

## 2018-06-17 DIAGNOSIS — J449 Chronic obstructive pulmonary disease, unspecified: Secondary | ICD-10-CM | POA: Diagnosis not present

## 2018-06-21 ENCOUNTER — Other Ambulatory Visit: Payer: Self-pay

## 2018-06-21 NOTE — Patient Outreach (Signed)
Stan[facility] Rios) Care Management  06/21/2018  Stanley Rios. 1955-09-16 [id number]  Successful outreach to the patients home on today's date to follow up on [place] appointment needs. BSW spoke with the patients wife, Stanley Rios, who is able to provide patients HIPAA identifiers. Stanley Rios informed BSW her phone was almost out of minutes "until tomorrow or Wednesday" so she may be cut off. Stanley Rios able to confirm the patients radiology orders have been transferred to [place] to assist with patient transportation concerns. This appointment is scheduled for December 18 at 11:00 am. The patient does not have transportation for this appointment. BSW informed Stanley Rios, this BSW would look into options and contact her at a later date. Stanley Rios stated understanding.  Plan: BSW to arrange transportation for patient via [employer] school teacher. BSW to outreach Stanley Rios within the next week to confirm arranged transportation.  Stanley Rios, BSW, CDP [facility] [phone]

## 2018-06-25 ENCOUNTER — Other Ambulatory Visit: Payer: Self-pay | Admitting: *Deleted

## 2018-06-25 NOTE — Patient Outreach (Addendum)
[place] [facility]) Care Management  06/25/2018  Stanley Rios. Jan 16, 1956 [id number]   Telephone assessment    Unsuccessful outreach call to patient , no answer able to leave a HIPAA compliant message for return call   Addendum  Return call from patient wife,   Wife discussed regarding transportation to [facility]ray department [facility] on 12/18 . Discussed with   COPD  Wife discussed patient now has home O2 with concentrator and portable tanks , through [place] care .  Wife denies patient having any worsening of COPD symptoms , still gets short of breath with activity  Wife discussed it being more difficult for her to manage patient care .   Chronic pain Taking medication as prescribed, wife concern whether other procedures can be done by pain doctor to treat back and leg pain She discussed patient using a walker, reinforced to continue use of walker for support and help with balance.  Social  Wife discussed patient still having difficulty in relationship with  his mother , she has spoken to [place] through [place]  he  suggested writing a letter to his mother , she reports he was able to do that on today, reports  Patient being tearful at times about relationship with his mother .  Wife discussed patient has shared  with her that he is interested in Hospice services. She discussed that he is wants to look out for her. She discussed it may be helpful to speak with him  in person .    Heart failure  Patient not weighing on a regular basis . She discussed recent weight 237 , some swelling in legs noted especially later in the day.  Wife discussed difficult getting patient to weigh in the morning   Plan Will plan home visit in the next week.  Patient will benefit from additional education on his decision about considering hospice, most phone conversation with with is wife.  Will communicate with PCP regarding patient request.    [name], RN, [place] Management Coordinator  [phone]- Mobile [phone]- [place]

## 2018-06-28 ENCOUNTER — Ambulatory Visit: Payer: Self-pay | Admitting: *Deleted

## 2018-06-29 ENCOUNTER — Ambulatory Visit: Payer: Self-pay

## 2018-06-30 ENCOUNTER — Other Ambulatory Visit: Payer: Self-pay

## 2018-06-30 NOTE — Patient Outreach (Signed)
[place] [facility]) Care Management  06/30/2018  Stanley Rios. 05-29-1956 [id number]  Successful outreach to the patients spouse, Stanley Rios. HIPAA identifiers confirmed. BSW confirmed transportation arrangements with Stanley Rios for patients 12/18 appointment. Stanley Rios is aware the patient will be pick up at 10:00 am and transported by [place] also confirmed receipt of application to [facility] "Dial-A-Ride" program. Stanley Rios plans to complete application and submit for patients future appointments to [facility] physicians. Stanley Rios stated the patients next appointment in [place] is not until March and she "hopes to figure something out by then". BSW provided support to Stanley Rios and encouragement to focus on this month rather than stressing out about future appointments. Stanley Rios stated understanding and discussed upcomming home visit with [facility] RN CM. Stanley Rios is hopeful the patient will qualify for Hospice in the near future to help improve the patients quality.  BSW to perform a discipline closure as all available resources have been provided and Stanley Rios is able to confirm contact with Dial-A-Ride. BSW to alert [facility] RN CM Stanley Rios of discipline closure.   [name], BSW, CDP [facility] [phone]

## 2018-07-02 ENCOUNTER — Other Ambulatory Visit: Payer: Self-pay | Admitting: *Deleted

## 2018-07-02 NOTE — Patient Outreach (Signed)
[facility]) Care Management   07/02/2018  Stanley Rios. Mar 24, 1956 [id number]  Stanley Rios. is an 62 y.o. male  Subjective:  Patient states feeling pretty good while sitting , reports getting short of breath, legs weakness when walking just to the kitchen from his recliner chair, states the pain in his back contributes to a lot of this . Patient states that he has been told that there is no other procedure that can be done to help with the pain in his back or hernia in chest .   Wife discussed PCP will now manage patient pain medications.  Patient discussed being tired of going to doctors and there is nothing they can to .  Patient as well as wife asking about hospice services, they discussed prior conversations with Dr.Drummond about patient condition, palliative services through [facility] , patient declined at that time   Patient and wife state that he can't afford the copayment for home  palliative services and they want little more support . Wife discussed previous conversation with [facility] regarding Palliative care services.  Wife discussed being familiar with Hospice services from previous family experience.   Objective:  BP 118/72 (BP Location: Left Arm, Patient Position: Sitting, Cuff Size: Normal)   Pulse (!) 103   Resp 20   Ht 1.803 m (_0 )   Wt 233 lb 8 oz (105.9 kg)   SpO2 93%   BMI 32.57 kg/m  Review of Systems  Constitutional: Negative.   HENT: Negative.   Eyes: Negative.   Respiratory: Negative.   Cardiovascular: Positive for leg swelling.  Gastrointestinal: Negative.   Genitourinary: Negative.   Musculoskeletal: Positive for back pain and neck pain.  Skin: Negative.   Neurological: Positive for tremors.       Complaint of hand tremors   Endo/Heme/Allergies: Negative.   Psychiatric/Behavioral: Positive for depression.    Physical Exam  Constitutional: He is oriented to person, place, and time. He appears well-developed and  well-nourished.  Cardiovascular: Normal rate and normal heart sounds.  Respiratory: Effort normal.  Decreased breath sounds   GI: Soft. Bowel sounds are normal.  Neurological: He is alert and oriented to person, place, and time.  Skin: Skin is warm and dry.  Psychiatric: He has a normal mood and affect. His behavior is normal. Judgment and thought content normal.    Encounter Medications:   Outpatient Encounter Medications as of 07/02/2018  Medication Sig Note  . acetaminophen (TYLENOL) 500 MG tablet Take 1,000 mg by mouth.   Marland Kitchen albuterol (PROVENTIL HFA;VENTOLIN HFA) 108 (90 Base) MCG/ACT inhaler Inhale 2 puffs into the lungs every 4 (four) hours as needed for wheezing or shortness of breath. Reported on 10/26/2015   . buPROPion (WELLBUTRIN) 75 MG tablet Take 75 mg by mouth 2 (two) times daily. Reported on 10/26/2015   . clonazePAM (KLONOPIN) 0.5 MG tablet Take 0.5 mg by mouth 3 (three) times daily as needed.    . cyclobenzaprine (FLEXERIL) 10 MG tablet Take 1 tablet (10 mg total) by mouth 3 (three) times daily as needed for muscle spasms.   . DULoxetine (CYMBALTA) 60 MG capsule Take 60 mg by mouth daily. Reported on 10/26/2015   . finasteride (PROSCAR) 5 MG tablet Take 5 mg by mouth daily.   . fluticasone (FLONASE) 50 MCG/ACT nasal spray SHAKE LIQUID AND USE 2 SPRAYS IN EACH NOSTRIL DAILY   . Fluticasone-Umeclidin-Vilant (TRELEGY ELLIPTA) 100-62.5-25 MCG/INH AEPB Inhale 1 puff into the lungs daily.   Marland Kitchen  furosemide (LASIX) 20 MG tablet Take two once or twice daily as directed , patient taking 47m once daily   . gabapentin (NEURONTIN) 800 MG tablet Take 1 tablet (800 mg total) by mouth every 8 (eight) hours.   .Marland KitchenguaiFENesin (MUCINEX) 600 MG 12 hr tablet Take 600 mg by mouth every 12 (twelve) hours. Patient taking 400 mg 3 times a day   . ipratropium-albuterol (DUONEB) 0.5-2.5 (3) MG/3ML SOLN Take 3 mLs by nebulization every 4 (four) hours as needed.   . lidocaine (XYLOCAINE) 4 % external solution  Apply topically 3 (three) times daily as needed.   .Marland Kitchenlosartan (COZAAR) 25 MG tablet Take 25 mg by mouth daily.    . meloxicam (MOBIC) 15 MG tablet Take 1 tablet (15 mg total) by mouth daily.   . metoprolol tartrate (LOPRESSOR) 25 MG tablet Take 1 tablet (25 mg total) by mouth 2 (two) times daily.   .Marland Kitchenomeprazole (PRILOSEC) 40 MG capsule Take 40 mg by mouth 2 (two) times daily.   . polyethylene glycol (MIRALAX / GLYCOLAX) packet Take 17 g by mouth daily.   . predniSONE (DELTASONE) 10 MG tablet Take 1 tablet by mouth daily.    . promethazine (PHENERGAN) 25 MG tablet Take 25 mg by mouth every 6 (six) hours as needed.  09/23/2017: Has on hand if needed  . rosuvastatin (CRESTOR) 5 MG tablet Take 5 mg by mouth daily. Taking every other day   . traMADol (ULTRAM) 50 MG tablet Take 1 tablet (50 mg total) by mouth every 6 (six) hours as needed.   . traZODone (DESYREL) 50 MG tablet Take 150 mg by mouth at bedtime.   . Cyanocobalamin (VITAMELTS ENERGY VITAMIN B-12) 1500 MCG TBDP Take 1 tablet by mouth daily. (Patient not taking: Reported on 05/17/2018)   . ibuprofen (ADVIL,MOTRIN) 200 MG tablet Take 400 mg by mouth.   .Marland Kitchenipratropium (ATROVENT HFA) 17 MCG/ACT inhaler Inhale 2 puffs into the lungs every 6 (six) hours.   . tamsulosin (FLOMAX) 0.4 MG CAPS capsule Take 0.4 mg by mouth daily.   . Vitamin D, Ergocalciferol, (DRISDOL) 50000 units CAPS capsule Take 1 capsule (50,000 Units total) by mouth 2 (two) times a week x 6 weeks. (Patient not taking: Reported on 07/02/2018)    No facility-administered encounter medications on file as of 07/02/2018.     Functional Status:   In your present state of health, do you have any difficulty performing the following activities: 04/14/2018 09/17/2017  Hearing? N N  Vision? N N  Difficulty concentrating or making decisions? N Y  Walking or climbing stairs? Y Y  Comment - using walker, forgetful at time   Dressing or bathing? YTempie Donning Comment wife helps wife assist   Doing  errands, shopping? Y Y  Comment wife helps  wife/familly  assist with tTherapist, occupationaland eating ? N N  Comment - wife prepares  Using the Toilet? N N  In the past six months, have you accidently leaked urine? N N  Do you have problems with loss of bowel control? N N  Managing your Medications? YTempie Donning Comment wife helps wife helps   Managing your Finances? N Y  Comment - wife assist   Housekeeping or managing your Housekeeping? Y N  Comment wife helps wife does house keeping   Some recent data might be hidden    Fall/Depression Screening:    Fall Risk  05/19/2018 05/04/2018 04/14/2018  Falls in the past year?  Yes No No  Risk for fall due to : - - -  Risk for fall due to: Comment - - -   PHQ 2/9 Scores 05/19/2018 05/04/2018 04/14/2018 01/25/2018 09/23/2017 08/12/2017 07/30/2017  PHQ - 2 Score 0 0 _0 0 0  PHQ- 9 Score - - _1 - -    Assessment:  Routine home visit   COPD Patient has new oxygen  concentrator, and portable tanks for transport, some improvement with constant flow oxygen when transporting outside home vs the prior pulse oxygen.   wearing  oxygen at 4 liters with exertion and 2  liters to room air at rest. 93 % on room air resting.  Patient continues to use nebulizer treatment at twice daily, albuterol inhaler as needed, discussed increasing oxygen prior ambulating  .  Limited in mobility tolerance, review purse lip breathing, exercises . Marland KitchenWill discuss with PCP patient asking about hospice services through /[place], discussed with patient and wife that  MD will have to determine if patient eligible .  Patient will benefit from education on palliative vs hospice services.   CHF Patient not weighing on a daily basis, today's weight  with 231,no increase in   swelling in lower legs . Continues to work on limiting salt in diet.  Chronic pain  Taking medications as prescribed, poor pain control with activity. Per wife, patient will no longer be  followed by pain management clinic, states PCP will manage pain.   Depression/anxiety   Taking medications as prescribed. Spending less time with things he once enjoyed such as use of his tablet.  Medications  Patient now receives medications by delivery to home  from [facility].Taking medications as prescribed.   Plan: Placed call to [place] office able to discuss patient request to be  Considered  for referral to Hospice service with [facility] that will forward message.  Will sent PCP visit note.  EMMI education on Hospice/palliative care .  Will plan follow up call in the next 2 weeks.    THN CM Care Plan Problem One     Most Recent Value  Care Plan Problem One  Patient with history of COPD , self care managment concerns   Role Documenting the Problem One  Care Management Coordinator  Care Plan for Problem One  Active  THN Long Term Goal   Patient will be able to report increased knowledge of managment of COPD over the next 76 days   THN Long Term Goal Start Date  04/14/18  Interventions for Problem One Long Term Goal  home visit completed, Review of worsening COPD symptoms to notify MD of , review with teachback  yellow zone symptoms and action plan.   THN CM Short Term Goal #1   Patient will be able to report working on breathing exerise at least 2 times a day over the next 30 days  [goal restated ]  THN CM Short Term Goal #1 Start Date  07/02/18  Interventions for Short Term Goal #1  Discussed how purse lips breathing , help with control of his breathing , helps calm you down and slow breathing   THN CM Short Term Goal #2   Over the next 30 days Patient will be able to report , using stationary cycler for 5 minutes 3 days a week. Barrie Folk restated ]  THN CM Short Term Goal #2 Start Date  06/25/18  Interventions for Short Term Goal #2  encouraged activity as tolerated   THN  CM Short Term Goal #3  Patient/wife will be able to report having new concentrator and oxygen cylinders for  travel in place   [facility] CM Short Term Goal #3 Start Date  06/10/18  [facility] CM Short Term Goal #3 Met Date  06/25/18  THN CM Short Term Goal #4  Over the next 30 days patient will be able to verbalize at 2 differences in palliative care and hospice care   [facility] CM Short Term Goal #4 Start Date  07/02/18  Interventions for Short Term Goal #4  EMMI video on Hospice service viewed at visit , offered call to [place] [facility] for additonal information.     [facility] CM Care Plan Problem Two     Most Recent Value  Care Plan Problem Two  Knowledge related to Heart failure self care managment   Role Documenting the Problem Two  Care Management Coordinator  Care Plan for Problem Two  Active  Interventions for Problem Two Long Term Goal   REviewed with patient and wife worsening symptoms of heart faillure , able to recall worsening symptoms of increased sob, swelling ,sudden weight gain with teachback,   THN Long Term Goal  Patient will be able to report increase knowledge of Heart failure managment  over the next 90 days  [goal exended.]  THN Long Term Goal Start Date  04/14/18 Aletha Halim extend date to meet goals ]  [facility] CM Short Term Goal #1   Patient will be able to reports weighing at least 3 days a week and keeping a record over the next 30 days   THN CM Short Term Goal #1 Start Date  06/25/18 Barrie Folk restarted ]  [facility] CM Short Term Goal #2   Patient will report checking blood pressure at least 5 days a week and keeping a record over the next 30 days   THN CM Short Term Goal #2 Start Date  05/17/18 Barrie Folk restart ]    [facility] CM Care Plan Problem Three     Most Recent Value  Care Plan Problem Three  Decreased pain control related to chronic back , leg pain.   Role Documenting the Problem Three  Care Management Coordinator  Care Plan for Problem Three  Active  THN CM Short Term Goal #1   Over the next 30 days patient will be able to report at least 2 measures to improve pain control   THN CM Short Term Goal #1 Start Date   07/02/18  Interventions for Short Term Goal #1  Discussed current measures to help decrease pain, encouraged to discuss with PCP, discuss positioning, resting between activity, breaking up activity through out the day.. Discussed taking medications as prescribed., relaxtion techniques.       [name], RN, [place] Management Coordinator  [phone]- Mobile [phone]- Toll Free Main Office

## 2018-07-12 ENCOUNTER — Encounter: Payer: PPO | Admitting: Nurse Practitioner

## 2018-07-14 DIAGNOSIS — R918 Other nonspecific abnormal finding of lung field: Secondary | ICD-10-CM | POA: Diagnosis not present

## 2018-07-14 DIAGNOSIS — R911 Solitary pulmonary nodule: Secondary | ICD-10-CM | POA: Diagnosis not present

## 2018-07-14 DIAGNOSIS — J449 Chronic obstructive pulmonary disease, unspecified: Secondary | ICD-10-CM | POA: Diagnosis not present

## 2018-07-16 ENCOUNTER — Other Ambulatory Visit: Payer: Self-pay | Admitting: *Deleted

## 2018-07-16 NOTE — Patient Outreach (Signed)
[facility]) Care Management  07/16/2018  Stanley Rios. [birth year] [id number]   Telephone follow up call   62 year old male with PMHx: includes but not limited to stage 3 gold, COPD, history of pulmonary nodule and radiation therapy completed, HTN, PAD, Lower extremity edema, repair of diaphragmatic hernia 10/17 with gortex mesh, Removal of mesh 2/13/ 2019, hyperlipidemia, spinal stenosis, chronic pain  Successful telephone outreach to patient wife , Stanley Rios. Wife discussed patient was able to complete Lung scan on this week, she reports that they have not hear of result yet.   Wife discussed concerns   COPD She discussed that patient continues to have episodes of shortness of breath with activities. She discussed that he has low energy, tires easily.  She discussed that patient continues to take medications inhaler daily.  Wife discussed patient continues to get very anxious and down regarding current condition and relationship with the mother.  Wife discussed recent interaction with his mother after returning home from his CT scan.  Patient wife still interested in Palliative care/hospice services, she understands possible  cost concern of Palliative care and considering trying to work it out with her finances.  Encouraged wife to schedule sooner office visit with PCP to discuss concerns.  CHF Discussed no noted increase in swelling , patient is not weighing on a daily basis. Continues taking medications as prescribed.  Chronic Pain; Wife discussed patient pain limits his activity and increases his shortness breath with activity.   Plan  Will plan follow up call to patient in the next 2 weeks.  Will plan care coordination call to Palliative care regarding cost and follow up with PCP regarding patient/wife concerns regarding condition and benefit for palliative services and previous outreach to office on patient request.    [facility] CM Care Plan Problem One      Most Recent Value  Care Plan Problem One  Patient with history of COPD , self care managment concerns   Role Documenting the Problem One  Care Management Coordinator  Care Plan for Problem One  Active  [facility] Long Term Goal   Patient will be able to report increased knowledge of managment of COPD over the next 60 days   THN Long Term Goal Start Date  07/16/18  Interventions for Problem One Long Term Goal  Discussed with wife, importance of follow up with MD and scheduling a sooner , discussed notifying care coordinator if concerns regarding transporation and encouraged to complete paperwork provided by BSW .   THN CM Short Term Goal #1   Patient/wife be able to verbalize 2 measures of  COPD Management with daily activities over the next 30 days    THN CM Short Term Goal #1 Start Date  07/16/18  Interventions for Short Term Goal #1  Advised regarding pacing activity , resting between activity to conserve energy for daily task   THN CM Short Term Goal #2   Over the next 30 days patient will be able to verbalize daily working on breathing exercises.   THN CM Short Term Goal #2 Start Date  07/16/18  Interventions for Short Term Goal #2  Discussed with patient benefits of breathing exercises in helping in strengthen lungs . , Will send EMMI on breathing exercises.   THN CM Short Term Goal #3  Over the next 30 days patient/wife will be able to verbalize 2 worsening symptom of COPD  THN CM Short Term Goal #3 Start Date  07/16/18  Interventions for Short Tern Goal #3  Discussed worsening symptoms of COPD in yellow zone and when to call MD  [facility] CM Short Term Goal #4  Over the next 30 days patient will be able to verbalize at 2 differences in palliative care and hospice care   [facility] CM Short Term Goal #4 Start Date  07/02/18  Interventions for Short Term Goal #4  Review with wife difference in palliative and Hospice, will place care coordination call to Palliiative /[facility] regarding palliative  care .     THN CM Care Plan Problem Two     Most Recent Value  Care Plan Problem Two  Knowledge related to Heart failure self care managment   Role Documenting the Problem Two  Care Management Coordinator  Care Plan for Problem Two  Not Active  THN Long Term Goal  Patient will be able to report increase knowledge of Heart failure managment  over the next 90 days   THN Long Term Goal Start Date  04/14/18  [facility] Long Term Goal Met Date  07/16/18  [facility] CM Short Term Goal #1   Patient will be able to reports weighing at least 3 days a week and keeping a record over the next 30 days   THN CM Short Term Goal #1 Start Date  06/25/18  [facility] CM Short Term Goal #2   Patient will report checking blood pressure at least 5 days a week and keeping a record over the next 30 days   THN CM Short Term Goal #2 Start Date  05/17/18    [facility] CM Care Plan Problem Three     Most Recent Value  Care Plan Problem Three  Decreased pain control related to chronic back , leg pain.   Role Documenting the Problem Three  Care Management Coordinator  Care Plan for Problem Three  Active  THN CM Short Term Goal #1   Patient will be able to report at least 2 measures in managing chronic pain over the next 30 days   THN CM Short Term Goal #1 Start Date  07/02/18  Interventions for Short Term Goal #1  discussed palliative care as option for chronic pain as a resource .       [name], RN, [place] Management Coordinator  [phone]- Mobile [phone]- Toll Free Main Office

## 2018-07-17 DIAGNOSIS — K458 Other specified abdominal hernia without obstruction or gangrene: Secondary | ICD-10-CM | POA: Diagnosis not present

## 2018-07-17 DIAGNOSIS — J189 Pneumonia, unspecified organism: Secondary | ICD-10-CM | POA: Diagnosis not present

## 2018-07-17 DIAGNOSIS — J449 Chronic obstructive pulmonary disease, unspecified: Secondary | ICD-10-CM | POA: Diagnosis not present

## 2018-07-29 ENCOUNTER — Other Ambulatory Visit: Payer: Self-pay | Admitting: *Deleted

## 2018-07-29 NOTE — Patient Outreach (Signed)
[facility]) Care Management  07/29/2018  Stanley Rios. [birth year] [id number]   Telephonic follow up   Referral received :9/12 Referral source : [place] [facility] health coach Referral reason : Patient wife reports decline in patient condition   63 year old male with PMHx: includes but not limited to stage 3 gold, COPD, history of pulmonary nodule and radiation therapy completed, HTN, PAD, Lower extremity edema, repair of diaphragmatic hernia 10/17 with gortex mesh, Removal of mesh 2/13/ 2019, hyperlipidemia, spinal stenosis, chronic pain.   Successful outreach call to patient , he reports that he is feeling much better. Patient discussed recent episode of increased difficulty with breathing, wheezing increased cough. He discussed his wife contacting Pulmonary office and prednisone and antibiotics ordered, he reports they was urged to go to emergency room but his wife preferring to treat patient in home.   He discussed feeling much better decreased cough, his breathing being back to his baseline. Patient discussed that he continues to get really worn out with activity especially taking a shower even with the help of his wife.  Patient discussed that he plans to hold off on Palliative care right now,  He discussed cost being one of the barriers as well as,he feels that Hospice care might be the best , but the doctor doesn't feel like I am ready for that.  Discussed with patient importance of follow up with PCP after recent symptoms and treatment plan.   Patient placed his wife on the phone discussed with her also the importance of patient following up with PCP regarding recent respiratory concerns and discuss plan of care.   Wife is in agreement with scheduling PCP visit .  Wife also discussed patient has not being taking his fluid pill as prescribed, and he has more swelling in his legs.  Educated on importance of taking medications as prescribed, and how build up of fluid  will also cause increase in swelling and shortness of breath .    Plan  Will plan follow up call in the next 2 weeks.  Reinforced importance of medical follow up after recent episode of worsening of symptoms of COPD.   Reinforced taking medications as prescribed completing full dose of antibiotics.    THN CM Care Plan Problem One     Most Recent Value  Care Plan Problem One  Patient with history of COPD , self care managment concerns   Role Documenting the Problem One  Care Management Coordinator  Care Plan for Problem One  Active  THN Long Term Goal   Patient will be able to report increased knowledge of managment of COPD over the next 61 days   THN Long Term Goal Start Date  07/16/18  Interventions for Problem One Long Term Goal  Reinforced taking medications as prescribed, following up with MD as recommended and with action plan on worsening COPD symptoms .   THN CM Short Term Goal #1   Patient/wife be able to verbalize 2 measures of  COPD Management with daily activities over the next 30 days    THN CM Short Term Goal #1 Start Date  07/16/18  Interventions for Short Term Goal #1  Discussed pacing self with activity such as bathing , afford bathing directly after eating a meal.  THN CM Short Term Goal #2   Over the next 30 days patient will be able to verbalize daily working on breathing exercises.   THN CM Short Term Goal #2 Start Date  07/16/18  Interventions for Short Term Goal #2  Reinforced continuing to work on breathing exercises through out the day, prior to activity.   THN CM Short Term Goal #3  Over the next 30 days patient/wife will be able to verbalize 2 worsening symptom of COPD  THN CM Short Term Goal #3 Start Date  07/16/18  [facility] CM Short Term Goal #3 Met Date  07/29/18  THN CM Short Term Goal #4  Over the next 30 days patient will be able to verbalize at 2 differences in palliative care and hospice care   [facility] CM Short Term Goal #4 Start Date  07/02/18  [facility] CM Short Term  Goal #4 Met Date  07/29/18    [facility] CM Care Plan Problem Two     Most Recent Value  Care Plan Problem Two  Knowledge related to Heart failure self care managment   Role Documenting the Problem Two  Care Management Coordinator  Care Plan for Problem Two  Active  Interventions for Problem Two Long Term Goal   Discussed with patient/wife regarding importance of managing heart failure, taking medications as prescribed, keeping track of weights daily. Reviewed yellow zone of worsening symptoms and action plan   THN Long Term Goal  Patient will be able to report increase knowledge of Heart failure self care  management  over the next 60  days   THN Long Term Goal Start Date  07/29/18 Barrie Folk reactivated ]  THN CM Short Term Goal #1   Patient will be able to reports weighing at least 3 days a week and keeping a record over the next 30 days   THN CM Short Term Goal #1 Start Date  07/29/18  Interventions for Short Term Goal #2   Advised regarding importance of monitoring weigh to identify sudden weight changes .   THN CM Short Term Goal #2   Patient will report checking blood pressure at least 5 days a week and keeping a record over the next 30 days   THN CM Short Term Goal #2 Start Date  05/17/18    [facility] CM Care Plan Problem Three     Most Recent Value  Care Plan Problem Three  Decreased pain control related to chronic back , leg pain.   Role Documenting the Problem Three  Care Management Coordinator  Care Plan for Problem Three  Active  THN CM Short Term Goal #1   Patient will be able to report at least 2 measures in managing chronic pain over the next 30 days   THN CM Short Term Goal #1 Start Date  07/02/18  [facility] CM Short Term Goal #1 Met Date  07/29/18      [name], RN, [place] Management Coordinator  [phone]- Mobile [phone]- [place]

## 2018-08-11 ENCOUNTER — Other Ambulatory Visit: Payer: Self-pay | Admitting: *Deleted

## 2018-08-11 NOTE — Patient Outreach (Signed)
[place] [facility]) Care Management  08/11/2018  Stanley Rios. November 14, 1955 [id number]   Telephone outreach follow up call    Referral received :9/12 Referral source : [facility] health coach Referral reason : Patient wife reports decline in patient condition   63 year old male with PMHx: includes but not limited to stage 3 gold, COPD, history of pulmonary nodule and radiation therapy completed, HTN, PAD, Lower extremity edema, repair of diaphragmatic hernia 10/17 with gortex mesh, Removal of mesh 2/13/ 2019, hyperlipidemia, spinal stenosis, chronic pain.  Successful outreach call to patient wife Stanley Rios, she discussed patient is still improved from recent episode of worsening respiratory symptoms . Patient completed antibiotics.  She shared that patient has his usual shortness of breath episode when up walking, but recovering after resting. She discussed she continues to try to encourage patient with increasing mobility she states he walks only from bedroom to living room does not walk into kitchen. Discussed with wife follow up PCP as recommended by pulmonary after recent episode.  Wife discussed that she has been sick lately, working on plan to get her car repaired.  She shared that she as received money from sister and plans to take her to be repaired on tomorrow.  She shared that she has completed application for dial a ride and mailed application . Discussed transportation to PCP office visit,as previously scheduled with [facility] services , wife states she plans to wait until she gets her care repaired and scheduled office visit. She discussed plan to discuss Palliative care with PCP in face to face visit, so he can see patient .  She has been communicating with PCP office by mychart.   Heart Failure  Wife discussed patient has decreased swelling in his legs, she still works on limiting salt in diet . Patient has not weighed in the last 3 weeks. She states that he has started  back taking lasix as prescribed. Advised importance of taking medication as prescribed, monitoring weighs and limiting salt to avoid worsening symptoms of heart failure and identify worsening symptoms sooner.    Plan Will plan return call to patient in the next month for follow up on progress and care goals  Reinforced scheduling  PCP follow up and notifying MD of worsening symptoms .   THN CM Care Plan Problem One     Most Recent Value  Care Plan Problem One  Patient with history of COPD , self care managment concerns   Role Documenting the Problem One  Care Management Coordinator  Care Plan for Problem One  Active  THN Long Term Goal   Patient will be able to report increased knowledge of managment of COPD over the next 51 days   THN Long Term Goal Start Date  07/16/18  Interventions for Problem One Long Term Goal  Reviewed current clinical state, advised following up with PCP , discussed transporation needs   THN CM Short Term Goal #1   Patient/wife be able to verbalize 2 measures of  COPD Management with daily activities over the next 30 days    THN CM Short Term Goal #1 Start Date  07/16/18  [facility] CM Short Term Goal #1 Met Date  08/11/18  THN CM Short Term Goal #2   Over the next 30 days patient will be able to verbalize daily working on breathing exercises.   THN CM Short Term Goal #2 Start Date  07/16/18  Interventions for Short Term Goal #2  again discusssed benefits of exercises  to help with relieving sob , by slswing breathing   THN CM Short Term Goal #3  Over the next 30 days patient/wife will be able to verbalize 2 worsening symptom of COPD  THN CM Short Term Goal #3 Start Date  07/16/18  [facility] CM Short Term Goal #3 Met Date  07/29/18  THN CM Short Term Goal #4  Over the next 30 days patient will be able to verbalize at 2 differences in palliative care and hospice care   [facility] CM Short Term Goal #4 Start Date  07/02/18  [facility] CM Short Term Goal #4 Met Date  07/29/18    [facility] CM Care  Plan Problem Two     Most Recent Value  Care Plan Problem Two  Knowledge related to Heart failure self care managment   Role Documenting the Problem Two  Care Management Coordinator  Care Plan for Problem Two  Active  Interventions for Problem Two Long Term Goal   Advised regarding measure to follow daily for living wiith heart failure, limiting salt in diet being as active as tolerated , taking medications as prescribed.   THN Long Term Goal  Patient will be able to report increase knowledge of Heart failure self care  management  over the next 60  days   THN Long Term Goal Start Date  07/29/18 Barrie Folk reactivated ]  THN CM Short Term Goal #1   Patient will be able to reports weighing at least 3 days a week and keeping a record over the next 30 days   THN CM Short Term Goal #1 Start Date  07/29/18  Interventions for Short Term Goal #2   Advised regarding importance keeping trrack of weights to identify worsening symptoms soon.   THN CM Short Term Goal #2   Patient will report checking blood pressure at least 5 days a week and keeping a record over the next 30 days   THN CM Short Term Goal #2 Start Date  05/17/18    [facility] CM Care Plan Problem Three     Most Recent Value  Care Plan Problem Three  Decreased pain control related to chronic back , leg pain.   Role Documenting the Problem Three  Care Management Coordinator  Care Plan for Problem Three  Active  THN CM Short Term Goal #1   Patient will be able to report at least 2 measures in managing chronic pain over the next 30 days   THN CM Short Term Goal #1 Start Date  07/02/18  [facility] CM Short Term Goal #1 Met Date  07/29/18      [name], RN, [place] Management Coordinator  [phone]- Mobile [phone]- [place]

## 2018-08-13 ENCOUNTER — Telehealth: Payer: Self-pay | Admitting: Pain Medicine

## 2018-08-13 ENCOUNTER — Encounter: Payer: Self-pay | Admitting: Pain Medicine

## 2018-08-13 NOTE — Telephone Encounter (Signed)
Pts wife called and stated that Dr. Consuela Mimes has given permission for [facility] to take over pts pain meds but they will need a letter from Dr. Delane Ginger saying this is ok.

## 2018-08-13 NOTE — Telephone Encounter (Signed)
Left message on DR Consuela Mimes desk and forwarded this message to him.

## 2018-08-17 ENCOUNTER — Other Ambulatory Visit: Payer: Self-pay | Admitting: Pain Medicine

## 2018-08-17 DIAGNOSIS — J189 Pneumonia, unspecified organism: Secondary | ICD-10-CM | POA: Diagnosis not present

## 2018-08-17 DIAGNOSIS — K458 Other specified abdominal hernia without obstruction or gangrene: Secondary | ICD-10-CM | POA: Diagnosis not present

## 2018-08-17 DIAGNOSIS — J449 Chronic obstructive pulmonary disease, unspecified: Secondary | ICD-10-CM | POA: Diagnosis not present

## 2018-08-18 ENCOUNTER — Encounter: Payer: Self-pay | Admitting: Pain Medicine

## 2018-08-19 ENCOUNTER — Other Ambulatory Visit: Payer: Self-pay | Admitting: Pain Medicine

## 2018-08-19 ENCOUNTER — Telehealth: Payer: Self-pay | Admitting: Pain Medicine

## 2018-08-19 NOTE — Telephone Encounter (Signed)
Patient called again asking to speak with Anderson Malta. There is a problem with the letter sent to pcp?

## 2018-08-19 NOTE — Telephone Encounter (Signed)
Pts wife called and stated that she missed a call from a nurse and would like a call back.

## 2018-08-19 NOTE — Telephone Encounter (Signed)
Patient wife instructed that I faxed the letter this morning and I will mail her a copy to her home address as requested.

## 2018-09-08 ENCOUNTER — Ambulatory Visit: Payer: Self-pay | Admitting: *Deleted

## 2018-09-10 ENCOUNTER — Other Ambulatory Visit: Payer: Self-pay | Admitting: *Deleted

## 2018-09-10 NOTE — Patient Outreach (Signed)
[place] [facility]) Care Management  09/10/2018  Giovani Neumeister. 24-Jul-1956 [id number]   Telephone call  Incoming call from patient wife  Referral received :9/12 Referral source : [facility] health coach Referral reason : Patient wife reports decline in patient condition   63 year old male with PMHx: includes but not limited to stage 3 gold, COPD, history of pulmonary nodule and radiation therapy completed, HTN, PAD, Lower extremity edema, repair of diaphragmatic hernia 10/17 with gortex mesh, Removal of mesh 2/13/ 2019, hyperlipidemia, spinal stenosis, chronic pain.   Incoming call from patient wife , Ziair Penson she discussed patient ongoing issues related to COPD. She reports patient with  increased shortness of breath,especially when walking and wants oxygen to be increased, she encourages purse lip breathing exercises.  She discussed otherwise patient not wearing oxygen for the most part when sitting in his chair. She reports talking with patient pulmonary NP on today and reinforced keeping saturation around 92% reports he is usually in this range when resting desats to 87 when ambulation with oxygen at 4 to 5 liters.   She has been advised that goal is to keep o2 sat in 923% range and oxygen off while resting if saturation in this range on room air.  Patient usually only walks from bedroom to living room to his recliner with decreased mobility tolerance, being anxious and short of breath, recovers after resting.   Heart failure  Patient with lower extremity edema, not wearing compression hose states patient will not wear them if he had them, discussed benefits of wearing them. Patient is not weighing at home, advised regarding benefits of knowing weight and if sudden increases of 3 pounds in a day and 5 in a week action plan of notifying MD. Patient wife able to verbalize worsening symptoms , she states that patient is taking medications including fluid pill as prescribed.   Appointments  Wife discussed patient has scheduled appointment with PCP on next week on 2/21  , she plans to discuss palliative care consult .  She also reports patient has an appointment with Dr.Drummond Pulmonary at [facility] on March 4. Transportation concerns.  Wife reports she has had repairs done on her car, but has been encouraged not  to drive it out of town, distance due to still poor  condition . She states that patient mother will not assist and she does not have any one else to assist.  She states that she will be able to provide transportation to [place]CP [place] the [place] in [place],  but her concern was getting patient March 4 appointment, she recalled previous transportation arranged by [facility] by [employer] , and the ease of getting patient to visit without increasing his respitory condition , with patient being able to use scooter onto [place]. She recalled previous BSW Tillie Rung attempting to arrange transportation with another agency she is unable to recall, but states she has sent in an application and has not heard back. Patient states that she had hoped to have another transportation service in place prior to this March appointment .    Plan Will discuss transportation concerns with LCSW.  Reinforced worsen COPD symptoms with wife to seek medical attention for .  Will plan follow up call in the next 2 weeks.   [name], RN, [place] Management Coordinator  [phone]- Mobile [phone]- Toll Free Main Office

## 2018-09-13 NOTE — Addendum Note (Signed)
Addended by: [name] A on: 09/13/2018 03:33 PM   Modules accepted: Orders

## 2018-09-15 ENCOUNTER — Ambulatory Visit: Payer: Self-pay | Admitting: *Deleted

## 2018-09-16 ENCOUNTER — Other Ambulatory Visit: Payer: Self-pay

## 2018-09-16 NOTE — Patient Outreach (Addendum)
[place] [facility]) Care Management  09/16/2018  Kimothy Kishimoto. 06-02-1956 [id number]   Successful outreach to patient's spouse regarding social work referral for transportation assistance to appointments with Dr. Wynonia Musty at [facility] on 09/29/18 and Dr Allyn Kenner on 11/10/18.  Souse transports patient to local MD appointment.  She reports she has had repairs done on her car, but has been encouraged not  to drive it out of town, distance due to still poor  condition . She states that patient mother will not assist and she does not have any one else to assist.  BSW arranged transportation via [place] for these upcoming appointments.   Ronn Melena, BSW Social Worker [phone]

## 2018-09-17 DIAGNOSIS — K458 Other specified abdominal hernia without obstruction or gangrene: Secondary | ICD-10-CM | POA: Diagnosis not present

## 2018-09-17 DIAGNOSIS — J449 Chronic obstructive pulmonary disease, unspecified: Secondary | ICD-10-CM | POA: Diagnosis not present

## 2018-09-17 DIAGNOSIS — J189 Pneumonia, unspecified organism: Secondary | ICD-10-CM | POA: Diagnosis not present

## 2018-09-20 ENCOUNTER — Other Ambulatory Visit: Payer: Self-pay | Admitting: *Deleted

## 2018-09-20 NOTE — Patient Outreach (Addendum)
[place] [facility]) Care Management  09/20/2018  Stanley Rios. 01/18/1956 [id number]   Referral received :9/12 Referral source : [facility] health coach Referral reason : Patient wife reports decline in patient condition   63 year old male with PMHx: includes but not limited to stage 3 gold, COPD, history of pulmonary nodule and radiation therapy completed, HTN, PAD, Lower extremity edema, repair of diaphragmatic hernia 10/17 with gortex mesh, Removal of mesh 2/13/ 2019, hyperlipidemia, spinal stenosis, chronic pain.   Successful outreach call to patient wife , Stanley Rios.   Wife discussed patient doing pretty good on today .    She further discussed : COPD Wife discussed patient continues to have increased shortness of breath with activities, walking . She reports patient panics when he gets shortness of breath with walking ,wears oxygen at 2 liters at rest and up to 4 liters with walking.  still working on purse lip breathing reports oxygen level returns to greater than 90 after resting. She discussed oxygen saturation drops to 89-85% at night at times comes back up after waking patient up, she discussed that she has spoken with nurse at [place] office.  Wife discussed her plan to dicussed palliative care services medical visits.   Heart failure  Patient continues  to have some swelling in lower legs, especially right foot, not any worse , working on keeping legs propped up.  Patient is more consistent with taking fluid pills. Wife reports that his recent weight 233.  Not eating as much , gets full quicker.  BP 120/  Appointments. THN assisting BSW assisting with  Transportation to upcoming appointment with  Dr.Drummond on 3/4 Dr. Allyn Kenner 4/15 Wife will provide transportation to local  PCP office visit  on 3/12, she has limitation with how far she is can travel in her car due to the condition of it.   Plan  Will send [facility] calendar  Will plan follow up call  in the next month. Will send EMMI handout on palliative care and COPD diet Reinforced worsening symptom of COPD and encouraged notify MD sooner and 911 for emergency.   [facility] CM Care Plan Problem One     Most Recent Value  Care Plan Problem One  Patient with history of COPD , self care managment concerns   Role Documenting the Problem One  Care Management Coordinator  Care Plan for Problem One  Active  THN Long Term Goal   Patient will be able to report increased knowledge of managment of COPD over the next 53 days  [goal extended ]  THN Long Term Goal Start Date  07/16/18  Interventions for Problem One Long Term Goal  Reviewed current symptoms, advised keeping record of questions to address to upcoming medical appointment.Discussed importance of nutrition and how increased  breathing effort of COPD and importance of protein in diet    THN CM Short Term Goal #1   Patient/wife be able to verbalize 2 measures of  COPD Management with daily activities over the next 30 days    THN CM Short Term Goal #1 Start Date  07/16/18  [facility] CM Short Term Goal #1 Met Date  08/11/18  THN CM Short Term Goal #2   Over the next 30 days patient will be able to verbalize daily working on breathing exercises.   THN CM Short Term Goal #2 Start Date  07/16/18  [facility] CM Short Term Goal #2 Met Date  09/10/18  THN CM Short Term Goal #3  Over the next 30 days patient/wife will be able to verbalize 2 worsening symptom of COPD  THN CM Short Term Goal #3 Start Date  07/16/18  [facility] CM Short Term Goal #3 Met Date  07/29/18  THN CM Short Term Goal #4  Over the next 30 days patient will be able to verbalize at 2 differences in palliative care and hospice care   [facility] CM Short Term Goal #4 Start Date  07/02/18  [facility] CM Short Term Goal #4 Met Date  07/29/18    [facility] CM Care Plan Problem Two     Most Recent Value  Care Plan Problem Two  Knowledge related to Heart failure self care managment   Role Documenting the Problem Two  Care  Management Coordinator  Care Plan for Problem Two  Active  Interventions for Problem Two Long Term Goal   Reviewed worsening symptoms of Heart failure, discussed managment of Heart failure , taking medications , limiting salt in diet, tracking weights . keeping all medical visit l  THN Long Term Goal  Patient will be able to report increase knowledge of Heart failure self care  management  over the next 60  days   THN Long Term Goal Start Date  07/29/18 Barrie Folk reactivated ]  THN CM Short Term Goal #1   Patient will be able to reports weighing at least 3 days a week and keeping a record over the next 30 days   THN CM Short Term Goal #1 Start Date  09/20/18  Interventions for Short Term Goal #2   Discussed benefit of knowing weights to determine worsening of symptoms . discussed best time of day to weigh in the morning after going to the bathroom.   THN CM Short Term Goal #2   Patient will report checking blood pressure at least 5 days a week and keeping a record over the next 30 days   THN CM Short Term Goal #2 Start Date  05/17/18    [facility] CM Care Plan Problem Three     Most Recent Value  Care Plan Problem Three  Decreased pain control related to chronic back , leg pain.   Role Documenting the Problem Three  Care Management Coordinator  Care Plan for Problem Three  Not Active  THN CM Short Term Goal #1   Patient will be able to report at least 2 measures in managing chronic pain over the next 30 days   THN CM Short Term Goal #1 Start Date  07/02/18  [facility] CM Short Term Goal #1 Met Date  07/29/18      [name], RN, [place] Management Coordinator  [phone]- Mobile [phone]- Toll Free Main Office   .

## 2018-09-29 DIAGNOSIS — J432 Centrilobular emphysema: Secondary | ICD-10-CM | POA: Diagnosis not present

## 2018-09-29 DIAGNOSIS — T17908A Unspecified foreign body in respiratory tract, part unspecified causing other injury, initial encounter: Secondary | ICD-10-CM | POA: Diagnosis not present

## 2018-09-29 DIAGNOSIS — Z87891 Personal history of nicotine dependence: Secondary | ICD-10-CM | POA: Diagnosis not present

## 2018-09-29 DIAGNOSIS — R911 Solitary pulmonary nodule: Secondary | ICD-10-CM | POA: Diagnosis not present

## 2018-09-29 DIAGNOSIS — T17320S Food in larynx causing asphyxiation, sequela: Secondary | ICD-10-CM | POA: Diagnosis not present

## 2018-10-01 ENCOUNTER — Other Ambulatory Visit: Payer: Self-pay | Admitting: Pulmonary Disease

## 2018-10-01 DIAGNOSIS — J432 Centrilobular emphysema: Secondary | ICD-10-CM

## 2018-10-07 DIAGNOSIS — R609 Edema, unspecified: Secondary | ICD-10-CM | POA: Diagnosis not present

## 2018-10-07 DIAGNOSIS — J432 Centrilobular emphysema: Secondary | ICD-10-CM | POA: Diagnosis not present

## 2018-10-07 DIAGNOSIS — K219 Gastro-esophageal reflux disease without esophagitis: Secondary | ICD-10-CM | POA: Diagnosis not present

## 2018-10-07 DIAGNOSIS — I10 Essential (primary) hypertension: Secondary | ICD-10-CM | POA: Diagnosis not present

## 2018-10-11 ENCOUNTER — Other Ambulatory Visit: Payer: Self-pay | Admitting: *Deleted

## 2018-10-11 NOTE — Patient Outreach (Signed)
[place] [facility]) Care Management  10/11/2018  Ordell Prichett. Feb 26, 1956 [id number]   Telephone assessment  Referral received :9/12 Referral source : [facility] health coach Referral reason : Patient wife reports decline in patient condition    63 year old male with PMHx: includes but not limited to stage 3 gold, COPD, history of pulmonary nodule and radiation therapy completed, HTN, PAD, Lower extremity edema, repair of diaphragmatic hernia 10/17 with gortex mesh, Removal of mesh 2/13/ 2019, hyperlipidemia, spinal stenosis, chronic pai  Successful outreach call to patient, wife Broedy Osbourne. She discussed that patient continues to have increased shortness with movement specially when walking from bedroom to bathroom or living area. Wife discussed patient wearing oxygen at 3 to 31/2 liters when at sleep, increases up to 4 liters when walking , reports oxygen level down to 83-88 when mobile, but returns up to 90 range when at rest.  Wife discussed :   COPD/Emphysema  Recent visits to Dr.Drummond Pulmonary : and recommendations for palliative care , plan follow up Barium swallow due to complain of difficulty swallowing at times. She reports patient complains of feeling full at times, he is able to eat some foods , leaning to softer foods. Wife reports planning follow up call to Dr.Drummond nurse regarding referral to [place] for barium swallow.  Discussed importance of eating soft foods and examples, smaller meals, sitting up for meals.    Wife discussed patient having breathing test to evaluate CO2 retention at  next week to evaluate for additional respiratory  support at home, nocturnal trilogy , ) home ventilator  per note  Wife continues to express more difficulty assisting patient with ADl's and amount of time it takes to get ready to go out of home for test.  Wife discussed that she has spoken with palliative care over the phone and plans to return call to them to  update regarding hospice referral.  Patient had most recent visit to PCP , Dr. Guadelupe Sabin , he has referred patient to [facility] service. Wife discussed Hospice evaluation visit on 3/14 , wife understanding is  that after pending test for barium study and respiratory test done patient may be approved for hospice services, states that she was encouraged to proceed with scheduled test and follow up in 2 weeks, currently remains open case.  Wife concern regarding question of her not being able to leave patient at home alone  , to run short  errands, such as grocery store if he is under hospice service.  Heart Failure  Wife reports patient does not weigh at home now, but she helps him with organizing his medications and reports that patient continues to take as prescribed. Wife continues to limit salt in diet, denies any increase in usual swelling in legs.    Transportation Wife reports being able to provide patient to local, appointments in [place].    Assessment  Patient will benefit from Hospice services as recommended by PCP ,  patient wants to pursue additional test that have been scheduled and then follow up with Hospice if not approved per wife then they will discuss palliative care services.   Plan  Will plan care coordination call to [facility] regarding understanding of patient new referral.  Call to [facility] at [facility] , she discussed that patient in not currently enrolled in Hospice service, patient is currently seeking active treatment for diagnosis. She will review record for additional information and provide  follow up call, provided  my contact number. Will plan follow up call to patient within this week.  Will send PCP this visit note.   [name], RN, [place] Management Coordinator  [phone]- Mobile [phone]- Toll Free Main Office

## 2018-10-12 DIAGNOSIS — J189 Pneumonia, unspecified organism: Secondary | ICD-10-CM | POA: Diagnosis not present

## 2018-10-12 DIAGNOSIS — K458 Other specified abdominal hernia without obstruction or gangrene: Secondary | ICD-10-CM | POA: Diagnosis not present

## 2018-10-12 DIAGNOSIS — J449 Chronic obstructive pulmonary disease, unspecified: Secondary | ICD-10-CM | POA: Diagnosis not present

## 2018-10-15 ENCOUNTER — Other Ambulatory Visit: Payer: Self-pay

## 2018-10-15 ENCOUNTER — Other Ambulatory Visit: Payer: Self-pay | Admitting: Pulmonary Disease

## 2018-10-15 ENCOUNTER — Other Ambulatory Visit: Payer: Self-pay | Admitting: *Deleted

## 2018-10-15 DIAGNOSIS — T17320S Food in larynx causing asphyxiation, sequela: Secondary | ICD-10-CM

## 2018-10-15 NOTE — Patient Outreach (Addendum)
[place] [facility]) Care Management  10/15/2018  Stanley Rios 12/12/1955 [id number]   Care Coordination call    Telephone assessment  Referral received :9/12 Referral source : [facility] health coach Referral reason : Patient wife reports decline in patient condition    63 year old male with PMHx: includes but not limited to stage 3 gold, COPD, history of pulmonary nodule and radiation therapy completed, HTN, PAD, Lower extremity edema, repair of diaphragmatic hernia 10/17 with gortex mesh, Removal of mesh 2/13/ 2019, hyperlipidemia, spinal stenosis, chronic pain  Successful outreach call to wife to follow up on palliative care/Hospice care referrals.Wife again states that patient wants to wait until after respiratory test and barium swallow that is now scheduled for next month.  Wife states that patient wants Hospice services and wants to wait until after test have been done and hopefully be appropriate for  Hospice through [facility].  Discussed with wife Palliative Care  in [place] area as an option she again expressed concern about cost of initial referral cost, but  There goal is have  Hospice at [facility] and not begin with Palliative in [place] area.  Care coordination call to [facility] care palliative service able to speak with Langley Gauss to inquire if they have any type of program to help with copayment for Palliative home visit, she reports that they do not have a program for this . The cost of visit is same as a speciality office visit.   Placed call to [facility] internal medicine , Helene Kelp [place] , to collaborate regarding her recent contact with patient regarding recent contact regarding Palliative care, able to leave a message.   Discussed with patient regarding having available food supplies available in the home, she acknowledges that she has a supply on hand and a few food stamps left.  until additional funds in the next month. Provided contact number of [phone] for resource  needs related to covid-19 .  Patient continues to take medications as prescribed, wearing oxygen as recommended, increased shortness of breath with mobility but then is able to recover after resting. Wife has placed order for additional oxygen tanks for when they go outside the home.  Discussed with wife focusing on softer foods, discussed examples and what she has on hand due to patient having difficulty swallowing, chewing food completely, sitting up fully while eating .   Plan Will plan follow call to patient in the next week to update.  Will update PCP office of patient awaiting Hospice revisit after barium swallow test and blood gas to evaluate CO retention and consideration for trilogy ventilator as noted is  completed that are scheduled for next month. Will make contact with PCP office after collaboration with palliative at [facility].   Addendum  Return call from Helene Kelp at [facility] , dicussed patient wife concern regarding cost of Palliative care at [facility] care, she discussed following up with social worker at [facility] to see if any resources. She discussed home palliative care program at [facility] being revamped at this time. Discussed with Helene Kelp patient/wife plans to wait on future test to be completed and hopes to be eligible for Hospice through [facility]. Will discuss this follow up call with patient/wife.   [name], RN, [place] Management Coordinator  [phone]- Mobile [phone]- Toll Free Main Office   [facility] CM Care Plan Problem One     Most Recent Value  Care Plan Problem One  Patient with history of COPD , self care managment concerns  Role Documenting the Problem One  Care Management Coordinator  Care Plan for Problem One  Active  [facility]N Long Term Goal   Patient will be able to report increased knowledge of managment of COPD over the next 66 days   THN Long Term Goal Start Date  07/16/18  [facility] Long Term Goal Met Date  10/15/18  [facility] CM Short Term Goal #1    Patient/wife be able to verbalize 2 measures of  COPD Management with daily activities over the next 30 days    [facility] CM Short Term Goal #1 Start Date  07/16/18  [facility] CM Short Term Goal #1 Met Date  08/11/18  [facility]N CM Short Term Goal #2   Over the next 30 days patient will be able to verbalize daily working on breathing exercises.   THN CM Short Term Goal #2 Start Date  07/16/18  [facility] CM Short Term Goal #2 Met Date  09/10/18  [facility] CM Short Term Goal #3  Over the next 30 days patient/wife will be able to verbalize 2 worsening symptom of COPD  THN CM Short Term Goal #3 Start Date  07/16/18  [facility] CM Short Term Goal #3 Met Date  07/29/18  THN CM Short Term Goal #4  Over the next 30 days patient will be able to verbalize at 2 differences in palliative care and hospice care   [facility] CM Short Term Goal #4 Start Date  07/02/18  [facility] CM Short Term Goal #4 Met Date  07/29/18    [facility] CM Care Plan Problem Two     Most Recent Value  Care Plan Problem Two  Knowledge deficit related to hospice vs palliative cae service   Role Documenting the Problem Two  Care Management Coordinator  Care Plan for Problem Two  Active  Interventions for Problem Two Long Term Goal   Discussed with patient wife, how hospice and palliative care differ, will send referred recent education  EMMI on Hospice that agressive measures test or not usually done with this service.    THN Long Term Goal  Patient will be able to identify differences between hopsice and palliatve care over the next 45 days   THN Long Term Goal Start Date  10/15/18  [facility] CM Short Term Goal #1   Patient will be able to report being active with active with Hospice services over the next 30 days   THN CM Short Term Goal #1 Start Date  10/15/18  Interventions for Short Term Goal #2   REviewed patient goal of Hospice,care coordination call to [facility] palliative care.   [facility]N CM Short Term Goal #2   Patient will be able to report attending medical appointments over the next 30 days    [facility]N CM Short Term Goal #2 Start Date  10/15/18  Interventions for Short Term Goal #2  Discussed upcoming medical appointments and transporation plans.       [name], RN, [place] Management Coordinator  [phone]- Mobile [phone]- Toll Free Main Office

## 2018-10-18 ENCOUNTER — Other Ambulatory Visit: Payer: Self-pay | Admitting: *Deleted

## 2018-10-18 NOTE — Patient Outreach (Signed)
[facility]) Care Management  10/18/2018  Rochelle Nephew. 05-02-1956 [id number]   Incoming call from patient wife, Kenroy Timberman,   She discussed : Patient has decided that he does not want to proceed with ordered test scheduled that he wants proceed with Hospice services. She discussed that she has placed a call to [facility] hospice to let them know.  Spoke with patient, he again states that he wants Hospice services now instead of waiting to have test done. Discussed with patient his change in decision, " just tired of test "  He discussed understanding test scheduled for barium swallow to evaluate his difficulty swallowing and respiratory test to evaluate lung function for possible additional respiratory device at night.   He discussed being short of breath with activity oxygen saturation  88%, ,increasing oxygen up to 4 to 5 liters while up , but then being able to reduce oxygen back to 2 liters and resting saturations up to 96% at times.  Patient/wife reports taking medications as prescribed and using nebulizer daily as needed along with daily Trelegy.   Plan Reinforced continuing daily treatments as recommended , medications.  Will plan return call to patient and care coordination call to follow up on Hospice in the next 2 business days.   [name], RN, [place] Management Coordinator  [phone]- Mobile [phone]- Toll Free Main Office

## 2018-10-19 ENCOUNTER — Other Ambulatory Visit: Payer: Self-pay | Admitting: *Deleted

## 2018-10-19 NOTE — Patient Outreach (Signed)
[place] [facility]) Care Management  10/19/2018  Nuh Lipton. 1956/07/21 [id number]  Care Coordination  Received incoming message  from patient wife Ranferi Clingan, she states that she has not received a return call from [facility] that she placed a call to on yesterday regarding patient wanting to pursue Home hospice and now declining to have test , barium swallow and respiratory test for consideration of ventilator at night ordered by Dr.Drummond at recent office visit.   Returned call to [facility] able to speak to [facility] and explain patient/wife call and request.  Stanton Kidney explained that she would place follow up call to patient wife, stating that it would be Friday before reevaluation could be done.   Returned call to patient wife to explain [facility] to plan return call to her .  Plan Will plan return call to patient  in the next week.   [name], RN, [place] Management Coordinator  [phone]- Mobile [phone]- Toll Free Main Office

## 2018-10-20 ENCOUNTER — Ambulatory Visit: Payer: PPO

## 2018-10-25 ENCOUNTER — Other Ambulatory Visit: Payer: Self-pay

## 2018-10-25 ENCOUNTER — Other Ambulatory Visit: Payer: Self-pay | Admitting: *Deleted

## 2018-10-25 NOTE — Patient Outreach (Signed)
[place] [facility]) Care Management  10/25/2018  Joseff Luckman. 1956-03-11 [id number]   Incoming call from patient wife, she discussed that patient has been enrolled in [facility]. She discussed visits from intake nurse and RN visit with plans for RN and CNA visit this week as well as additional equipment for home.  She expressed thanks to [facility] care management nurse, social for assistance thus far.  Wife discussed need for cancelling arranged transportation for cardiology visit in April 14.  Per care everywhere [facility] hospice enrolled patient on 3/29.   Plan Will plan case closure : [facility] hospice will be managing care.  Inbasket message to [employer] regarding cancelling transportation request.  Will send PCP case closure letter   [name], RN, [place] Management Coordinator  [phone]- Mobile [phone]- [place]

## 2018-10-26 ENCOUNTER — Ambulatory Visit: Payer: PPO | Admitting: *Deleted

## 2018-11-08 ENCOUNTER — Ambulatory Visit: Payer: PPO

## 2018-11-16 DIAGNOSIS — J449 Chronic obstructive pulmonary disease, unspecified: Secondary | ICD-10-CM | POA: Diagnosis not present

## 2018-11-16 DIAGNOSIS — J189 Pneumonia, unspecified organism: Secondary | ICD-10-CM | POA: Diagnosis not present

## 2018-11-16 DIAGNOSIS — K458 Other specified abdominal hernia without obstruction or gangrene: Secondary | ICD-10-CM | POA: Diagnosis not present

## 2018-11-17 ENCOUNTER — Ambulatory Visit: Payer: PPO

## 2018-11-26 DEATH — deceased
